# Patient Record
Sex: Male | Born: 1935
Health system: Southern US, Community
[De-identification: ages and names within clinical notes are randomized; demographics above are authoritative.]

## PROBLEM LIST (undated history)

## (undated) DIAGNOSIS — H9190 Unspecified hearing loss, unspecified ear: Secondary | ICD-10-CM

## (undated) DIAGNOSIS — N390 Urinary tract infection, site not specified: Secondary | ICD-10-CM

## (undated) DIAGNOSIS — G459 Transient cerebral ischemic attack, unspecified: Secondary | ICD-10-CM

## (undated) DIAGNOSIS — E7800 Pure hypercholesterolemia, unspecified: Secondary | ICD-10-CM

## (undated) DIAGNOSIS — I4891 Unspecified atrial fibrillation: Secondary | ICD-10-CM

## (undated) DIAGNOSIS — R42 Dizziness and giddiness: Secondary | ICD-10-CM

## (undated) DIAGNOSIS — M316 Other giant cell arteritis: Secondary | ICD-10-CM

## (undated) HISTORY — PX: OTHER SURGICAL HISTORY: SHX169

## (undated) HISTORY — DX: Pure hypercholesterolemia, unspecified: E78.00

## (undated) HISTORY — DX: Unspecified hearing loss, unspecified ear: H91.90

## (undated) HISTORY — DX: Dizziness and giddiness: R42

---

## 2011-11-02 DIAGNOSIS — H43399 Other vitreous opacities, unspecified eye: Secondary | ICD-10-CM | POA: Diagnosis not present

## 2011-11-02 DIAGNOSIS — H25019 Cortical age-related cataract, unspecified eye: Secondary | ICD-10-CM | POA: Diagnosis not present

## 2012-07-17 DIAGNOSIS — Z23 Encounter for immunization: Secondary | ICD-10-CM | POA: Diagnosis not present

## 2013-06-27 DIAGNOSIS — Z23 Encounter for immunization: Secondary | ICD-10-CM | POA: Diagnosis not present

## 2014-06-17 DIAGNOSIS — L259 Unspecified contact dermatitis, unspecified cause: Secondary | ICD-10-CM | POA: Diagnosis not present

## 2014-06-26 DIAGNOSIS — I10 Essential (primary) hypertension: Secondary | ICD-10-CM | POA: Diagnosis not present

## 2014-06-26 DIAGNOSIS — Z23 Encounter for immunization: Secondary | ICD-10-CM | POA: Diagnosis not present

## 2014-06-26 DIAGNOSIS — Z79899 Other long term (current) drug therapy: Secondary | ICD-10-CM | POA: Diagnosis not present

## 2014-06-27 DIAGNOSIS — I10 Essential (primary) hypertension: Secondary | ICD-10-CM | POA: Diagnosis not present

## 2014-06-27 DIAGNOSIS — Z125 Encounter for screening for malignant neoplasm of prostate: Secondary | ICD-10-CM | POA: Diagnosis not present

## 2014-06-27 DIAGNOSIS — Z79899 Other long term (current) drug therapy: Secondary | ICD-10-CM | POA: Diagnosis not present

## 2014-06-27 DIAGNOSIS — Z0000 Encounter for general adult medical examination without abnormal findings: Secondary | ICD-10-CM | POA: Diagnosis not present

## 2014-07-03 DIAGNOSIS — E789 Disorder of lipoprotein metabolism, unspecified: Secondary | ICD-10-CM | POA: Diagnosis not present

## 2014-07-10 DIAGNOSIS — Z23 Encounter for immunization: Secondary | ICD-10-CM | POA: Diagnosis not present

## 2014-10-02 DIAGNOSIS — E789 Disorder of lipoprotein metabolism, unspecified: Secondary | ICD-10-CM | POA: Diagnosis not present

## 2014-10-09 DIAGNOSIS — L249 Irritant contact dermatitis, unspecified cause: Secondary | ICD-10-CM | POA: Diagnosis not present

## 2014-10-09 DIAGNOSIS — E789 Disorder of lipoprotein metabolism, unspecified: Secondary | ICD-10-CM | POA: Diagnosis not present

## 2014-10-15 DIAGNOSIS — L309 Dermatitis, unspecified: Secondary | ICD-10-CM | POA: Diagnosis not present

## 2014-10-15 DIAGNOSIS — L738 Other specified follicular disorders: Secondary | ICD-10-CM | POA: Diagnosis not present

## 2014-10-27 DIAGNOSIS — L309 Dermatitis, unspecified: Secondary | ICD-10-CM | POA: Diagnosis not present

## 2014-11-12 DIAGNOSIS — L309 Dermatitis, unspecified: Secondary | ICD-10-CM | POA: Diagnosis not present

## 2014-12-01 DIAGNOSIS — M316 Other giant cell arteritis: Secondary | ICD-10-CM | POA: Diagnosis not present

## 2014-12-01 DIAGNOSIS — H9190 Unspecified hearing loss, unspecified ear: Secondary | ICD-10-CM | POA: Diagnosis not present

## 2014-12-01 DIAGNOSIS — R51 Headache: Secondary | ICD-10-CM | POA: Diagnosis not present

## 2014-12-02 DIAGNOSIS — L249 Irritant contact dermatitis, unspecified cause: Secondary | ICD-10-CM | POA: Diagnosis not present

## 2014-12-04 ENCOUNTER — Encounter: Payer: Self-pay | Admitting: *Deleted

## 2014-12-05 ENCOUNTER — Ambulatory Visit (INDEPENDENT_AMBULATORY_CARE_PROVIDER_SITE_OTHER): Payer: Medicare Other | Admitting: Neurology

## 2014-12-05 ENCOUNTER — Encounter: Payer: Self-pay | Admitting: Neurology

## 2014-12-05 VITALS — BP 158/80 | HR 72 | Ht 68.0 in | Wt 157.2 lb

## 2014-12-05 DIAGNOSIS — H9192 Unspecified hearing loss, left ear: Secondary | ICD-10-CM | POA: Diagnosis not present

## 2014-12-05 DIAGNOSIS — R209 Unspecified disturbances of skin sensation: Secondary | ICD-10-CM | POA: Diagnosis not present

## 2014-12-05 DIAGNOSIS — R202 Paresthesia of skin: Secondary | ICD-10-CM

## 2014-12-05 NOTE — Progress Notes (Signed)
[facility] Clinic Note - Initial Visit   Date: 12/05/2014   Casey Lee MRN: [id number] DOB: [birth year]   Dear Dr. Wilson Singer:  Thank you for your kind referral of Casey Lee for consultation of giant cell arteritis. Although his history is well known to you, please allow [place] to reiterate it for the purpose of our medical record. The patient was accompanied to the clinic by self.    History of Present Illness: Casey Lee is a 81 y.o. right-handed Caucasian male in remarkable good health who is only taking medication for hyperlipidemia and aspirin for cardioprotective benefits.  He was referred today for evaluation of left facial numbness, left hearing loss, and evaluation for giant cell arteritis.    He was in very good health and last sought medical care last in 1993, however in September 2015, he developed a rash involving his hands so went to see his PCP.  Rash was treated with antibiotics and prednisone taper in December which cleared up his rash.  Around the same time, he developed intermittent spells of numbness involving the left side of the face.  He thought it was due to statin medication, so stopped it but there was no change in his facial numbness.  The numbness occurs about 1-2 per week without any triggers. There is no tingling, pain, weakness, of drooping of the face.   About two weeks ago, he developed left hearing loss.  There is no ear pain, drainage, or recent illness.  No problems with balance, vision problems, headaches, or temperal artery tenderness.  Denies jaw claudication.  Last eye evaluation was two years ago.  He has not had audiogram.  He has been seeing his PCP for the above complaints and had ESR checked which returned normal x 3. Due to concern of giant cell arteritis, he was started on prednisone taper and has noticed hearing starting to improve.    Out-side paper records, electronic medical record, and images have been  reviewed where available and summarized as:  ESR 5 (10/09/2014), 7 (12/01/2014), 5 (12/02/2014) CRP 1.9 (12/02/2014)  Past Medical History  Diagnosis Date  . High cholesterol   . Hearing loss     Past Surgical History  Procedure Laterality Date  . None       Medications:  Aspirin 25m Pravastatin 875mPrednisone taper pack   Allergies: No Known Allergies  Family History: Family History  Problem Relation Age of Onset  . Rheum arthritis Father   . Rheum arthritis Sister   . Heart failure Father     Deceased, 726. Diabetes Mellitus II Sister   . Healthy Son     x2    Social History: History   Social History  . Marital Status: Married    Spouse Name: N/A  . Number of Children: N/A  . Years of Education: N/A   Occupational History  . Not on file.   Social History Main Topics  . Smoking status: Former Smoker    Types: Cigarettes    Quit date: 11/07/1952  . Smokeless tobacco: Never Used  . Alcohol Use: No  . Drug Use: No  . Sexual Activity: Not on file   Other Topics Concern  . Not on file   Social History Narrative   Lives with wife in split level home.  Has 2 sons.     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   EYES: No visual changes or eye pain ENT: +hearing changes.  No history of nose bleeds.   RESPIRATORY: No cough, wheezing and shortness of breath.   CARDIOVASCULAR: Negative for chest pain, and palpitations.   GI: Negative for abdominal discomfort, blood in stools or black stools.  No recent change in bowel habits.   GU:  No history of incontinence.   MUSCLOSKELETAL: No history of joint pain or swelling.  No myalgias.   SKIN: Negative for lesions, rash, and itching.   HEMATOLOGY/ONCOLOGY: Negative for prolonged bleeding, bruising easily, and swollen nodes.  No history of cancer.   ENDOCRINE: Negative for cold or heat  intolerance, polydipsia or goiter.   PSYCH:  No depression or anxiety symptoms.   NEURO: As Above.   Vital Signs:  BP 158/80 mmHg  Pulse 72  Ht 5' 8"  (1.727 m)  Wt 157 lb 3 oz (71.3 kg)  BMI 23.91 kg/m2  SpO2 97%   General Medical Exam:   General:  Well appearing, comfortable.   Eyes/ENT: see cranial nerve examination.   Neck: No masses appreciated.  Full range of motion without tenderness.  No carotid, opthalmic, or temporal artery bruits bilaterally.  Bilateral thready temporal arteries, without tenderness. Respiratory:  Clear to auscultation, good air entry bilaterally.   Cardiac:  Regular rate and rhythm, no murmur.   Extremities:  No deformities, edema, or skin discoloration.  Skin:  No rashes or lesions.  Neurological Exam: MENTAL STATUS including orientation to time, place, person, recent and remote memory, attention span and concentration, language, and fund of knowledge is normal.  Speech is not dysarthric.  CRANIAL NERVES: II:  No visual field defects.  Unremarkable fundi.   III-IV-VI: Pupils equal round and reactive to light. Mild esotropia on the left, no correction with cover test. Normal conjugate, extra-ocular eye movements in all directions of gaze.  No nystagmus.  No ptosis.   V:  Normal facial sensation.  Jaw jerk is absent.   VII:  Normal facial symmetry and movements.  No pathologic facial reflexes.  VIII:  Normal hearing and vestibular function.  Rinne and Weber test is normal bilaterally. IX-X:  Normal palatal movement.   XI:  Normal shoulder shrug and head rotation.   XII:  Normal tongue strength and range of motion, no deviation or fasciculation.  MOTOR:  No atrophy, fasciculations or abnormal movements.  No pronator drift.  Tone is normal.    Right Upper Extremity:    Left Upper Extremity:    Deltoid  5/5   Deltoid  5/5   Biceps  5/5   Biceps  5/5   Triceps  5/5   Triceps  5/5   Wrist extensors  5/5   Wrist extensors  5/5   Wrist flexors  5/5   Wrist  flexors  5/5   Finger extensors  5/5   Finger extensors  5/5   Finger flexors  5/5   Finger flexors  5/5   Dorsal interossei  5/5   Dorsal interossei  5/5   Abductor pollicis  5/5   Abductor pollicis  5/5   Tone (Ashworth scale)  0  Tone (Ashworth scale)  0   Right Lower Extremity:    Left Lower Extremity:    Hip flexors  5/5   Hip flexors  5/5   Hip extensors  5/5   Hip extensors  5/5   Knee flexors  5/5   Knee flexors  5/5  Knee extensors  5/5   Knee extensors  5/5   Dorsiflexors  5/5   Dorsiflexors  5/5   Plantarflexors  5/5   Plantarflexors  5/5   Toe extensors  5/5   Toe extensors  5/5   Toe flexors  5/5   Toe flexors  5/5   Tone (Ashworth scale)  0  Tone (Ashworth scale)  0   MSRs:  Right                                                                 Left brachioradialis 2+  brachioradialis 2+  biceps 2+  biceps 2+  triceps 2+  triceps 2+  patellar 2+  patellar 2+  ankle jerk 1+  ankle jerk 1+  Hoffman no  Hoffman no  plantar response down  plantar response down   SENSORY:  Normal and symmetric perception of light touch, pinprick, vibration, and proprioception.  Romberg's sign absent.   COORDINATION/GAIT: Normal finger-to- nose-finger and heel-to-shin.  Intact rapid alternating movements bilaterally.  Able to rise from a chair without using arms.  Gait narrow based and stable. Tandem and stressed gait intact.    IMPRESSION/PLAN: Mr. Epps is a delightful 82 year-old gentleman in remarkable good health presenting for evaluation of possible giant cell arteritis in the setting of left hearing changes and facial paresthesias.  His examination today is essentially non-focal. His temporal arteries are symmetrically thready, but without tenderness.  He lacks the typical findings for GCA including headache, vision changes, elevated sedimentation rate, jaw claudication, or mylagias.  Hearing loss is very atypical presenting for GCA and in the absence of other features to support  vasculitis, the pre-test probability of temporal artery biopsy is low.  I did offer send him for temporal artery biopsy, but he would like to see how he does after completing his course of steroids.   Additionally, with his left facial paresthesias and hearing changes, I did recommend getting MRI brain given the lateralizing symptoms.  On exam, sensation of the face is intact and hearing based on gross testing is symmetrically intact.  He chooses to be conservative and would like to hold on doing additional testing at this time.  I stressed the importance of informing me, if his symptoms worsen.     The duration of this appointment visit was 50 minutes of face-to-face time with the patient.  Greater than 50% of this time was spent in counseling, explanation of diagnosis, planning of further management, and coordination of care.   Thank you for allowing me to participate in patient's care.  If I can answer any additional questions, I would be pleased to do so.    Sincerely,    Donika K. Posey Pronto, DO

## 2014-12-05 NOTE — Patient Instructions (Signed)
1.  If there is any worsening of your facial discomfort or hearing loss, please call my office so we can schedule MRI brain 2.  If there is worsening of symptoms after completing steroids, you will need to be scheduled for temporal artery biopsy, but my overall suspicion for this is low. 3.  Please have your eyes checked 4.  Return to clinic as needed

## 2014-12-08 NOTE — Progress Notes (Signed)
Note faxed.

## 2015-02-04 DIAGNOSIS — L738 Other specified follicular disorders: Secondary | ICD-10-CM | POA: Diagnosis not present

## 2015-02-04 DIAGNOSIS — T814XXA Infection following a procedure, initial encounter: Secondary | ICD-10-CM | POA: Diagnosis not present

## 2015-02-04 DIAGNOSIS — M7021 Olecranon bursitis, right elbow: Secondary | ICD-10-CM | POA: Diagnosis not present

## 2015-02-04 DIAGNOSIS — D1801 Hemangioma of skin and subcutaneous tissue: Secondary | ICD-10-CM | POA: Diagnosis not present

## 2015-02-04 DIAGNOSIS — L821 Other seborrheic keratosis: Secondary | ICD-10-CM | POA: Diagnosis not present

## 2015-02-26 DIAGNOSIS — L218 Other seborrheic dermatitis: Secondary | ICD-10-CM | POA: Diagnosis not present

## 2015-02-26 DIAGNOSIS — L309 Dermatitis, unspecified: Secondary | ICD-10-CM | POA: Diagnosis not present

## 2015-07-02 DIAGNOSIS — Z23 Encounter for immunization: Secondary | ICD-10-CM | POA: Diagnosis not present

## 2015-11-02 DIAGNOSIS — N390 Urinary tract infection, site not specified: Secondary | ICD-10-CM | POA: Diagnosis not present

## 2015-11-02 DIAGNOSIS — N400 Enlarged prostate without lower urinary tract symptoms: Secondary | ICD-10-CM | POA: Diagnosis not present

## 2015-11-02 DIAGNOSIS — R32 Unspecified urinary incontinence: Secondary | ICD-10-CM | POA: Diagnosis not present

## 2015-11-03 DIAGNOSIS — N138 Other obstructive and reflux uropathy: Secondary | ICD-10-CM | POA: Diagnosis not present

## 2015-11-03 DIAGNOSIS — N401 Enlarged prostate with lower urinary tract symptoms: Secondary | ICD-10-CM | POA: Diagnosis not present

## 2015-11-03 DIAGNOSIS — Z0000 Encounter for general adult medical examination without abnormal findings: Secondary | ICD-10-CM | POA: Diagnosis not present

## 2015-11-11 ENCOUNTER — Encounter (HOSPITAL_COMMUNITY): Payer: Self-pay | Admitting: Emergency Medicine

## 2015-11-11 ENCOUNTER — Emergency Department (HOSPITAL_COMMUNITY)
Admission: EM | Admit: 2015-11-11 | Discharge: 2015-11-11 | Disposition: A | Discharge status: Home / Self Care | Payer: Medicare Other | Attending: Emergency Medicine | Admitting: Emergency Medicine

## 2015-11-11 DIAGNOSIS — Z7952 Long term (current) use of systemic steroids: Secondary | ICD-10-CM | POA: Insufficient documentation

## 2015-11-11 DIAGNOSIS — E7800 Pure hypercholesterolemia, unspecified: Secondary | ICD-10-CM | POA: Diagnosis not present

## 2015-11-11 DIAGNOSIS — Z87440 Personal history of urinary (tract) infections: Secondary | ICD-10-CM | POA: Insufficient documentation

## 2015-11-11 DIAGNOSIS — Z87891 Personal history of nicotine dependence: Secondary | ICD-10-CM | POA: Insufficient documentation

## 2015-11-11 DIAGNOSIS — H9190 Unspecified hearing loss, unspecified ear: Secondary | ICD-10-CM | POA: Insufficient documentation

## 2015-11-11 DIAGNOSIS — R531 Weakness: Secondary | ICD-10-CM | POA: Diagnosis present

## 2015-11-11 DIAGNOSIS — N400 Enlarged prostate without lower urinary tract symptoms: Secondary | ICD-10-CM | POA: Diagnosis not present

## 2015-11-11 DIAGNOSIS — Z79899 Other long term (current) drug therapy: Secondary | ICD-10-CM | POA: Insufficient documentation

## 2015-11-11 DIAGNOSIS — E860 Dehydration: Secondary | ICD-10-CM | POA: Diagnosis not present

## 2015-11-11 DIAGNOSIS — R110 Nausea: Secondary | ICD-10-CM | POA: Insufficient documentation

## 2015-11-11 DIAGNOSIS — Z7982 Long term (current) use of aspirin: Secondary | ICD-10-CM | POA: Insufficient documentation

## 2015-11-11 DIAGNOSIS — T887XXA Unspecified adverse effect of drug or medicament, initial encounter: Secondary | ICD-10-CM | POA: Diagnosis not present

## 2015-11-11 DIAGNOSIS — R42 Dizziness and giddiness: Secondary | ICD-10-CM | POA: Diagnosis not present

## 2015-11-11 HISTORY — DX: Urinary tract infection, site not specified: N39.0

## 2015-11-11 LAB — CBC WITH DIFFERENTIAL/PLATELET
Basophils Absolute: 0 K/uL (ref 0.0–0.1)
Basophils Relative: 0 %
Eosinophils Absolute: 0.2 K/uL (ref 0.0–0.7)
Eosinophils Relative: 2 %
HCT: 41.1 % (ref 39.0–52.0)
Hemoglobin: 13.9 g/dL (ref 13.0–17.0)
Lymphocytes Relative: 16 %
Lymphs Abs: 1.8 K/uL (ref 0.7–4.0)
MCH: 30.5 pg (ref 26.0–34.0)
MCHC: 33.8 g/dL (ref 30.0–36.0)
MCV: 90.1 fL (ref 78.0–100.0)
Monocytes Absolute: 1.1 K/uL — ABNORMAL HIGH (ref 0.1–1.0)
Monocytes Relative: 9 %
Neutro Abs: 8.4 K/uL — ABNORMAL HIGH (ref 1.7–7.7)
Neutrophils Relative %: 73 %
Platelets: 225 K/uL (ref 150–400)
RBC: 4.56 MIL/uL (ref 4.22–5.81)
RDW: 13.1 % (ref 11.5–15.5)
WBC: 11.6 K/uL — ABNORMAL HIGH (ref 4.0–10.5)

## 2015-11-11 LAB — COMPREHENSIVE METABOLIC PANEL
ALK PHOS: 95 U/L (ref 38–126)
ALT: 24 U/L (ref 17–63)
ANION GAP: 13 (ref 5–15)
AST: 26 U/L (ref 15–41)
Albumin: 3.8 g/dL (ref 3.5–5.0)
BUN: 27 mg/dL — ABNORMAL HIGH (ref 6–20)
CALCIUM: 9.6 mg/dL (ref 8.9–10.3)
CO2: 22 mmol/L (ref 22–32)
Chloride: 98 mmol/L — ABNORMAL LOW (ref 101–111)
Creatinine, Ser: 1.98 mg/dL — ABNORMAL HIGH (ref 0.61–1.24)
GFR calc Af Amer: 34 mL/min — ABNORMAL LOW (ref >60)
GFR calc non Af Amer: 29 mL/min — ABNORMAL LOW (ref >60)
Glucose, Bld: 138 mg/dL — ABNORMAL HIGH (ref 65–99)
Potassium: 4.7 mmol/L (ref 3.5–5.1)
SODIUM: 133 mmol/L — AB (ref 135–145)
Total Bilirubin: 0.8 mg/dL (ref 0.3–1.2)
Total Protein: 7.8 g/dL (ref 6.5–8.1)

## 2015-11-11 LAB — URINALYSIS, ROUTINE W REFLEX MICROSCOPIC
Bilirubin Urine: NEGATIVE
Glucose, UA: NEGATIVE mg/dL
Hgb urine dipstick: NEGATIVE
Ketones, ur: 15 mg/dL — AB
Leukocytes, UA: NEGATIVE
Nitrite: NEGATIVE
Protein, ur: NEGATIVE mg/dL
Specific Gravity, Urine: 1.024 (ref 1.005–1.030)
pH: 5 (ref 5.0–8.0)

## 2015-11-11 LAB — I-STAT TROPONIN, ED: Troponin i, poc: 0.02 ng/mL (ref 0.00–0.08)

## 2015-11-11 MED ORDER — SODIUM CHLORIDE 0.9 % IV BOLUS (SEPSIS)
500.0000 mL | Freq: Once | INTRAVENOUS | Status: AC
  Administered 2015-11-11: 500 mL via INTRAVENOUS

## 2015-11-11 NOTE — Discharge Instructions (Signed)

## 2015-11-11 NOTE — ED Notes (Signed)
Pt left with all belongings and ambulated out of the treatment area.  

## 2015-11-11 NOTE — ED Notes (Signed)
Pt. reports generalized weakness and  fatigue onset this week , mild nausea , denies emesis or diarrhea . No fever or chills.

## 2015-11-11 NOTE — ED Provider Notes (Signed)
CSN: [id number]     Arrival date & time 11/11/15  1913 History   First MD Initiated Contact with Patient 11/11/15 2057     Chief Complaint  Patient presents with  . Weakness  . Fatigue      HPI Pt. reports generalized weakness and fatigue onset this week , mild nausea , denies emesis or diarrhea . No fever or chills. Recently treated for UTI and BPH.  Urine flow is much improved.  No fever. Past Medical History  Diagnosis Date  . High cholesterol   . Hearing loss   . UTI (lower urinary tract infection)    Past Surgical History  Procedure Laterality Date  . None     Family History  Problem Relation Age of Onset  . Rheum arthritis Father   . Rheum arthritis Sister   . Heart failure Father     Deceased, 85  . Diabetes Mellitus II Sister   . Healthy Son     x2   Social History  Substance Use Topics  . Smoking status: Former Smoker    Types: Cigarettes    Quit date: 11/07/1952  . Smokeless tobacco: Never Used  . Alcohol Use: No    Review of Systems  Cardiovascular: Negative for chest pain.  All other systems reviewed and are negative.     Allergies  Review of patient's allergies indicates no known allergies.  Home Medications   Prior to Admission medications   Medication Sig Start Date End Date Taking? Authorizing Provider  aspirin 81 MG tablet Take 81 mg by mouth daily.    Historical Provider, MD  pravastatin (PRAVACHOL) 80 MG tablet Take 80 mg by mouth daily.    Historical Provider, MD  predniSONE (STERAPRED UNI-PAK) 10 MG tablet Take by mouth daily.    Historical Provider, MD   BP 130/60 mmHg  Pulse 80  Temp(Src) 98 F (36.7 C) (Oral)  Resp 19  SpO2 99% Physical Exam  Constitutional: He is oriented to person, place, and time. He appears well-developed and well-nourished. No distress.  HENT:  Head: Normocephalic and atraumatic.  Eyes: Pupils are equal, round, and reactive to light.  Neck: Normal range of motion.  Cardiovascular: Normal rate and  intact distal pulses.   Pulmonary/Chest: No respiratory distress.  Abdominal: Normal appearance. He exhibits no distension. There is no tenderness. There is no rebound and no guarding.  Musculoskeletal: Normal range of motion.  Neurological: He is alert and oriented to person, place, and time. No cranial nerve deficit.  Skin: Skin is warm and dry. No rash noted.  Psychiatric: He has a normal mood and affect. His behavior is normal.  Nursing note and vitals reviewed.   ED Course  Procedures (including critical care time) Medications  sodium chloride 0.9 % bolus 500 mL (500 mLs Intravenous New Bag/Given 11/11/15 2134)    Labs Review Labs Reviewed  CBC WITH DIFFERENTIAL/PLATELET - Abnormal; Notable for the following:    WBC 11.6 (*)    Neutro Abs 8.4 (*)    Monocytes Absolute 1.1 (*)    All other components within normal limits  COMPREHENSIVE METABOLIC PANEL - Abnormal; Notable for the following:    Sodium 133 (*)    Chloride 98 (*)    Glucose, Bld 138 (*)    BUN 27 (*)    Creatinine, Ser 1.98 (*)    GFR calc non Af Amer 29 (*)    GFR calc Af Amer 34 (*)    All other components within  normal limits  URINALYSIS, ROUTINE W REFLEX MICROSCOPIC (NOT AT [facility]) - Abnormal; Notable for the following:    Ketones, ur 15 (*)    All other components within normal limits  I-STAT TROPOININ, ED    Imaging Review No results found. I have personally reviewed and evaluated these images and lab results as part of my medical decision-making.   EKG Interpretation   Date/Time:  Wednesday November 11 2015 21:09:28 EST Ventricular Rate:  75 PR Interval:  181 QRS Duration: 88 QT Interval:  379 QTC Calculation: 423 R Axis:   -51 Text Interpretation:  Sinus rhythm LAD, consider left anterior fascicular  block Abnormal R-wave progression, early transition Abnormal ekg No  previous tracing Confirmed by Anokhi Shannon  MD, Meshach Perry ([id number]) on 11/11/2015  9:22:24 PM     After treatment in the ED the patient  feels back to baseline and wants to go home. MDM   Final diagnoses:  Dehydration        Leonard Schwartz, MD 11/11/15 2225

## 2015-11-16 DIAGNOSIS — R42 Dizziness and giddiness: Secondary | ICD-10-CM | POA: Diagnosis not present

## 2015-11-16 DIAGNOSIS — N400 Enlarged prostate without lower urinary tract symptoms: Secondary | ICD-10-CM | POA: Diagnosis not present

## 2015-12-15 DIAGNOSIS — N401 Enlarged prostate with lower urinary tract symptoms: Secondary | ICD-10-CM | POA: Diagnosis not present

## 2015-12-15 DIAGNOSIS — Z0000 Encounter for general adult medical examination without abnormal findings: Secondary | ICD-10-CM | POA: Diagnosis not present

## 2015-12-15 DIAGNOSIS — N138 Other obstructive and reflux uropathy: Secondary | ICD-10-CM | POA: Diagnosis not present

## 2016-01-24 ENCOUNTER — Inpatient Hospital Stay (HOSPITAL_COMMUNITY)
Admission: EM | Admit: 2016-01-24 | Discharge: 2016-01-28 | DRG: 082 | Disposition: A | Discharge status: Home / Self Care | Payer: Medicare Other | Attending: Neurology | Admitting: Neurology

## 2016-01-24 ENCOUNTER — Encounter (HOSPITAL_COMMUNITY): Payer: Self-pay | Admitting: Emergency Medicine

## 2016-01-24 ENCOUNTER — Emergency Department (HOSPITAL_COMMUNITY): Payer: Medicare Other

## 2016-01-24 ENCOUNTER — Inpatient Hospital Stay (HOSPITAL_COMMUNITY): Payer: Medicare Other

## 2016-01-24 DIAGNOSIS — S06359A Traumatic hemorrhage of left cerebrum with loss of consciousness of unspecified duration, initial encounter: Secondary | ICD-10-CM | POA: Diagnosis present

## 2016-01-24 DIAGNOSIS — M25512 Pain in left shoulder: Secondary | ICD-10-CM | POA: Diagnosis present

## 2016-01-24 DIAGNOSIS — S06350A Traumatic hemorrhage of left cerebrum without loss of consciousness, initial encounter: Secondary | ICD-10-CM | POA: Diagnosis not present

## 2016-01-24 DIAGNOSIS — R4182 Altered mental status, unspecified: Secondary | ICD-10-CM | POA: Diagnosis not present

## 2016-01-24 DIAGNOSIS — Z23 Encounter for immunization: Secondary | ICD-10-CM | POA: Diagnosis not present

## 2016-01-24 DIAGNOSIS — S06369A Traumatic hemorrhage of cerebrum, unspecified, with loss of consciousness of unspecified duration, initial encounter: Secondary | ICD-10-CM | POA: Diagnosis present

## 2016-01-24 DIAGNOSIS — I63311 Cerebral infarction due to thrombosis of right middle cerebral artery: Secondary | ICD-10-CM | POA: Diagnosis not present

## 2016-01-24 DIAGNOSIS — N179 Acute kidney failure, unspecified: Secondary | ICD-10-CM | POA: Diagnosis present

## 2016-01-24 DIAGNOSIS — I6200 Nontraumatic subdural hemorrhage, unspecified: Secondary | ICD-10-CM | POA: Diagnosis not present

## 2016-01-24 DIAGNOSIS — W19XXXA Unspecified fall, initial encounter: Secondary | ICD-10-CM | POA: Diagnosis present

## 2016-01-24 DIAGNOSIS — S0001XA Abrasion of scalp, initial encounter: Secondary | ICD-10-CM | POA: Diagnosis present

## 2016-01-24 DIAGNOSIS — E785 Hyperlipidemia, unspecified: Secondary | ICD-10-CM | POA: Diagnosis present

## 2016-01-24 DIAGNOSIS — R412 Retrograde amnesia: Secondary | ICD-10-CM | POA: Diagnosis present

## 2016-01-24 DIAGNOSIS — S065X9A Traumatic subdural hemorrhage with loss of consciousness of unspecified duration, initial encounter: Secondary | ICD-10-CM | POA: Diagnosis not present

## 2016-01-24 DIAGNOSIS — S0181XA Laceration without foreign body of other part of head, initial encounter: Secondary | ICD-10-CM | POA: Diagnosis not present

## 2016-01-24 DIAGNOSIS — Y92019 Unspecified place in single-family (private) house as the place of occurrence of the external cause: Secondary | ICD-10-CM

## 2016-01-24 DIAGNOSIS — F05 Delirium due to known physiological condition: Secondary | ICD-10-CM

## 2016-01-24 DIAGNOSIS — Z87891 Personal history of nicotine dependence: Secondary | ICD-10-CM | POA: Diagnosis not present

## 2016-01-24 DIAGNOSIS — R410 Disorientation, unspecified: Secondary | ICD-10-CM

## 2016-01-24 DIAGNOSIS — S0121XA Laceration without foreign body of nose, initial encounter: Secondary | ICD-10-CM | POA: Diagnosis present

## 2016-01-24 DIAGNOSIS — R411 Anterograde amnesia: Secondary | ICD-10-CM | POA: Diagnosis present

## 2016-01-24 DIAGNOSIS — I639 Cerebral infarction, unspecified: Secondary | ICD-10-CM | POA: Diagnosis not present

## 2016-01-24 DIAGNOSIS — S0990XA Unspecified injury of head, initial encounter: Secondary | ICD-10-CM | POA: Diagnosis not present

## 2016-01-24 DIAGNOSIS — E7800 Pure hypercholesterolemia, unspecified: Secondary | ICD-10-CM | POA: Diagnosis present

## 2016-01-24 DIAGNOSIS — H9190 Unspecified hearing loss, unspecified ear: Secondary | ICD-10-CM | POA: Diagnosis present

## 2016-01-24 DIAGNOSIS — I619 Nontraumatic intracerebral hemorrhage, unspecified: Secondary | ICD-10-CM

## 2016-01-24 DIAGNOSIS — S199XXA Unspecified injury of neck, initial encounter: Secondary | ICD-10-CM | POA: Diagnosis not present

## 2016-01-24 DIAGNOSIS — H8110 Benign paroxysmal vertigo, unspecified ear: Secondary | ICD-10-CM | POA: Diagnosis present

## 2016-01-24 DIAGNOSIS — S065XAA Traumatic subdural hemorrhage with loss of consciousness status unknown, initial encounter: Secondary | ICD-10-CM | POA: Diagnosis present

## 2016-01-24 DIAGNOSIS — S065X0A Traumatic subdural hemorrhage without loss of consciousness, initial encounter: Secondary | ICD-10-CM | POA: Diagnosis not present

## 2016-01-24 DIAGNOSIS — I638 Other cerebral infarction: Secondary | ICD-10-CM | POA: Diagnosis present

## 2016-01-24 DIAGNOSIS — I10 Essential (primary) hypertension: Secondary | ICD-10-CM | POA: Diagnosis not present

## 2016-01-24 DIAGNOSIS — I6789 Other cerebrovascular disease: Secondary | ICD-10-CM | POA: Diagnosis present

## 2016-01-24 DIAGNOSIS — S0003XA Contusion of scalp, initial encounter: Secondary | ICD-10-CM | POA: Diagnosis not present

## 2016-01-24 DIAGNOSIS — R079 Chest pain, unspecified: Secondary | ICD-10-CM | POA: Diagnosis present

## 2016-01-24 DIAGNOSIS — Z8249 Family history of ischemic heart disease and other diseases of the circulatory system: Secondary | ICD-10-CM | POA: Diagnosis not present

## 2016-01-24 DIAGNOSIS — G9340 Encephalopathy, unspecified: Secondary | ICD-10-CM | POA: Diagnosis not present

## 2016-01-24 DIAGNOSIS — S06360A Traumatic hemorrhage of cerebrum, unspecified, without loss of consciousness, initial encounter: Secondary | ICD-10-CM | POA: Diagnosis not present

## 2016-01-24 DIAGNOSIS — S299XXA Unspecified injury of thorax, initial encounter: Secondary | ICD-10-CM | POA: Diagnosis not present

## 2016-01-24 LAB — I-STAT CHEM 8, ED
BUN: 34 mg/dL — AB (ref 6–20)
CHLORIDE: 104 mmol/L (ref 101–111)
CREATININE: 1.5 mg/dL — AB (ref 0.61–1.24)
Calcium, Ion: 1.11 mmol/L — ABNORMAL LOW (ref 1.13–1.30)
GLUCOSE: 111 mg/dL — AB (ref 65–99)
HEMATOCRIT: 44 % (ref 39.0–52.0)
HEMOGLOBIN: 15 g/dL (ref 13.0–17.0)
POTASSIUM: 3.9 mmol/L (ref 3.5–5.1)
Sodium: 138 mmol/L (ref 135–145)
TCO2: 23 mmol/L (ref 0–100)

## 2016-01-24 LAB — URINALYSIS, ROUTINE W REFLEX MICROSCOPIC
BILIRUBIN URINE: NEGATIVE
Glucose, UA: NEGATIVE mg/dL
HGB URINE DIPSTICK: NEGATIVE
KETONES UR: NEGATIVE mg/dL
Leukocytes, UA: NEGATIVE
Nitrite: NEGATIVE
PROTEIN: NEGATIVE mg/dL
Specific Gravity, Urine: 1.01 (ref 1.005–1.030)
pH: 7.5 (ref 5.0–8.0)

## 2016-01-24 LAB — COMPREHENSIVE METABOLIC PANEL
ALBUMIN: 3.9 g/dL (ref 3.5–5.0)
ALT: 16 U/L — AB (ref 17–63)
AST: 22 U/L (ref 15–41)
Alkaline Phosphatase: 60 U/L (ref 38–126)
Anion gap: 14 (ref 5–15)
BUN: 28 mg/dL — AB (ref 6–20)
CHLORIDE: 102 mmol/L (ref 101–111)
CO2: 20 mmol/L — ABNORMAL LOW (ref 22–32)
CREATININE: 1.5 mg/dL — AB (ref 0.61–1.24)
Calcium: 9.4 mg/dL (ref 8.9–10.3)
GFR calc Af Amer: 47 mL/min — ABNORMAL LOW (ref >60)
GFR, EST NON AFRICAN AMERICAN: 41 mL/min — AB (ref >60)
GLUCOSE: 117 mg/dL — AB (ref 65–99)
POTASSIUM: 3.7 mmol/L (ref 3.5–5.1)
Sodium: 136 mmol/L (ref 135–145)
Total Bilirubin: 0.6 mg/dL (ref 0.3–1.2)
Total Protein: 7.1 g/dL (ref 6.5–8.1)

## 2016-01-24 LAB — DIFFERENTIAL
BASOS ABS: 0 10*3/uL (ref 0.0–0.1)
BASOS PCT: 0 %
EOS ABS: 0.3 10*3/uL (ref 0.0–0.7)
Eosinophils Relative: 3 %
LYMPHS ABS: 1.7 10*3/uL (ref 0.7–4.0)
Lymphocytes Relative: 19 %
MONOS PCT: 7 %
Monocytes Absolute: 0.7 10*3/uL (ref 0.1–1.0)
NEUTROS ABS: 6.5 10*3/uL (ref 1.7–7.7)
NEUTROS PCT: 71 %

## 2016-01-24 LAB — CBC
HEMATOCRIT: 41.5 % (ref 39.0–52.0)
HEMOGLOBIN: 13.9 g/dL (ref 13.0–17.0)
MCH: 30.1 pg (ref 26.0–34.0)
MCHC: 33.5 g/dL (ref 30.0–36.0)
MCV: 89.8 fL (ref 78.0–100.0)
Platelets: 159 10*3/uL (ref 150–400)
RBC: 4.62 MIL/uL (ref 4.22–5.81)
RDW: 13.2 % (ref 11.5–15.5)
WBC: 9.2 10*3/uL (ref 4.0–10.5)

## 2016-01-24 LAB — I-STAT TROPONIN, ED: Troponin i, poc: 0 ng/mL (ref 0.00–0.08)

## 2016-01-24 LAB — PROTIME-INR
INR: 1.06 (ref 0.00–1.49)
Prothrombin Time: 14 seconds (ref 11.6–15.2)

## 2016-01-24 LAB — APTT: APTT: 29 s (ref 24–37)

## 2016-01-24 MED ORDER — ONDANSETRON HCL 4 MG/2ML IJ SOLN
4.0000 mg | Freq: Once | INTRAMUSCULAR | Status: AC
  Administered 2016-01-24: 4 mg via INTRAVENOUS
  Filled 2016-01-24: qty 2

## 2016-01-24 MED ORDER — TETANUS-DIPHTH-ACELL PERTUSSIS 5-2.5-18.5 LF-MCG/0.5 IM SUSP
0.5000 mL | Freq: Once | INTRAMUSCULAR | Status: AC
  Administered 2016-01-25: 0.5 mL via INTRAMUSCULAR
  Filled 2016-01-24: qty 0.5

## 2016-01-24 MED ORDER — BACITRACIN ZINC 500 UNIT/GM EX OINT
1.0000 "application " | TOPICAL_OINTMENT | Freq: Two times a day (BID) | CUTANEOUS | Status: DC
  Administered 2016-01-24 – 2016-01-27 (×6): 1 via TOPICAL
  Filled 2016-01-24 (×13): qty 28.35

## 2016-01-24 NOTE — H&P (Addendum)
H&P Reason for Consult: confusion - code stroke Referring Physician: EMS - Code stroke  CC: fall  History is obtained from pateint -   HPI: Casey Lee is a 82 y.o. male with hx of HTN who was at home in his [facility] when he got from bed and felt lightheaded and apparently felt lightheaded on the way to the bathroom.  He denies LOC but he does not remember falling or even waking up on the floor - essentially denies LOC but at this time has definite anterograde and retrograde amnisia around the time of the fall.  Denies dyaphoresis, blurred vision before the fall.  He was brought in as a code stroke because of confusion.  In teh CT scanner it became obvious that the patient had no neurological symptoms and thus I cancelled the code stroke.  I am admitting the pt instead as the CT showed two small hemorrhagic spots, one ICH and one SDH.  Denied any reason for being dehydrated. Denied ETOH.  Denied hx of sz.  ICH Score: 1  ROS: A 14 point ROS was performed and is negative except as noted in the HPI.   Past Medical History  Diagnosis Date  . High cholesterol   . Hearing loss   . UTI (lower urinary tract infection)     Family History  Problem Relation Age of Onset  . Rheum arthritis Father   . Rheum arthritis Sister   . Heart failure Father     Deceased, 71  . Diabetes Mellitus II Sister   . Healthy Son     x2    Social History:  reports that he quit smoking about 63 years ago. His smoking use included Cigarettes. He has never used smokeless tobacco. He reports that he does not drink alcohol or use illicit drugs.   Exam: Current vital signs: BP 179/89 mmHg  Pulse 77  Temp(Src) 97.9 F (36.6 C) (Oral)  Resp 18  Ht 5\' 9"  (1.753 m)  Wt 68.04 kg (150 lb)  BMI 22.14 kg/m2  SpO2 100% Vital signs in last 24 hours: Temp:  [97.9 F (36.6 C)] 97.9 F (36.6 C) (04/23 2250) Pulse Rate:  [77] 77 (04/23 2250) Resp:  [18] 18 (04/23 2250) BP: (179)/(89) 179/89 mmHg (04/23 2250) SpO2:   [100 %] 100 % (04/23 2250) Weight:  [68.04 kg (150 lb)] 68.04 kg (150 lb) (04/23 2250)   Physical Exam  Constitutional: Appears well-developed and well-nourished.  Psych: Affect appropriate to situation Eyes: No scleral injection HENT: No OP obstrucion Head: Normocephalic.  Cardiovascular: Normal rate and regular rhythm.  Respiratory: Effort normal and breath sounds normal to anterior ascultation GI: Soft.  No distension. There is no tenderness.  Skin: WDI  Neuro: Mental Status: slight difficulty hearing, baseline Patient is awake, alert, oriented to person, place, month, year, and situation Patient is able to give a clear and coherent history. No signs of aphasia or neglect Cranial Nerves: II: Visual Fields are full. Pupils are equal, round, and reactive to light.  III,IV, VI: EOMI without ptosis or diploplia.  V: Facial sensation is symmetric to temperature VII: Facial movement is symmetric.  VIII: hearing is intact to voice X: Uvula elevates symmetrically XI: Shoulder shrug is symmetric. XII: tongue is midline without atrophy or fasciculations.  Motor: Tone is normal. Bulk is normal. 5/5 strength was present in all four extremities. Sensory: Sensation is symmetric to light touch and temperature in the arms and legs. Deep Tendon Reflexes: 2+ and symmetric in  the biceps and patellae.  Plantars: Toes are downgoing bilaterally.  Cerebellar: FNF and HKS are intact bilaterally    I have reviewed labs in epic and the results pertinent to this consultation are: acute renal failure  I have reviewed the images obtained: CT showing small ICH and SDH - i reviewed these personally  Impression: traumatic ICH and SDH.  Admit to stepdown. MRI brain.  Blood pressure goal 120-140.  No antithrombotics or lovenox for now.  Restart home antihypertensive immediately.  Gentle fluids.  Stroke attending will assume care after 7am  Recommendations: 1) as above   Addendum: has acute  decompensation in the MRI.  Exam mostly significant for non-sensical speech in the setting of severe agitation and acute confusion, fear.  Given haldol and improved significantly. CT head repeated and unchanged to my review. Neuro exam significant for somnolence and encephalopathy, drowsy but moves X 4 with nl strength, no neglect, BTTx 2. Not attentive enough to follow commands.

## 2016-01-24 NOTE — ED Provider Notes (Signed)
CSN: [id number]     Arrival date & time 01/24/16  2229 History  By signing my name below, I, Irene Pap, attest that this documentation has been prepared under the direction and in the presence of Sharlett Iles, MD. Electronically Signed: Irene Pap, ED Scribe. 01/24/2016. 11:17 PM.   Chief Complaint  Patient presents with  . Head Injury   The history is provided by the patient. No language interpreter was used.  HPI Comments: Casey Lee is a 82 y.o. male with a hx of high cholesterol brought in by EMS who presents to the Emergency Department complaining of a possible fall and head injury onset PTA. Wife states that she was upstairs watching TV when she heard him call for her. She did not witness a fall. Pt does not remember falling or the events preceding the fall except for him watching TV. She found him holding the base of his neck complaining of pain which is why she called EMS. EMS reported that he was confused/altered on exam. Pt reports left clavicular pain and wife states that pt was clutching his left chest earlier. He was unaware about the wound on the left posterior head until EMS arrived. Pt is on ASA 81 mg and took 3 prior to EMS arrival. A code stroke was initially called for altered state, but upon reevaluation, code stroke was cancelled. Pt is complaining of dizziness with sitting up during exam. Pt denies upper back pain, blurred vision, or posterior neck pain. No extremity pain.  Past Medical History  Diagnosis Date  . High cholesterol   . Hearing loss   . UTI (lower urinary tract infection)    Past Surgical History  Procedure Laterality Date  . None     Family History  Problem Relation Age of Onset  . Rheum arthritis Father   . Rheum arthritis Sister   . Heart failure Father     Deceased, 81  . Diabetes Mellitus II Sister   . Healthy Son     x2   Social History  Substance Use Topics  . Smoking status: Former Smoker    Types: Cigarettes     Quit date: 11/07/1952  . Smokeless tobacco: Never Used  . Alcohol Use: No    Review of Systems 10 Systems reviewed and all are negative for acute change except as noted in the HPI.   Allergies  Review of patient's allergies indicates no known allergies.  Home Medications   Prior to Admission medications   Medication Sig Start Date End Date Taking? Authorizing Provider  aspirin 81 MG tablet Take 81 mg by mouth daily.   Yes Historical Provider, MD  diphenhydrAMINE (BENADRYL) 25 mg capsule Take 25 mg by mouth every 6 (six) hours as needed for allergies.   Yes Historical Provider, MD  loratadine (CLARITIN) 10 MG tablet Take 10 mg by mouth daily as needed for allergies.   Yes Historical Provider, MD  tamsulosin (FLOMAX) 0.4 MG CAPS capsule Take 0.4 mg by mouth every other day. 12/31/15  Yes Historical Provider, MD   BP 179/89 mmHg  Pulse 77  Temp(Src) 97.9 F (36.6 C) (Oral)  Resp 18  Ht 5\' 9"  (1.753 m)  Wt 150 lb (68.04 kg)  BMI 22.14 kg/m2  SpO2 100% Physical Exam  Constitutional: He is oriented to person, place, and time. He appears well-developed and well-nourished. No distress.  Awake, alert  HENT:  Head: Normocephalic.  Abrasion posterior scalp; superficial laceration to the nasal bridge near right  eyebrow  Eyes: Conjunctivae and EOM are normal. Pupils are equal, round, and reactive to light.  Neck: Normal range of motion. Neck supple.  Cardiovascular: Normal rate, regular rhythm and normal heart sounds.   No murmur heard. Pulmonary/Chest: Effort normal and breath sounds normal. No respiratory distress.  Abdominal: Soft. Bowel sounds are normal. He exhibits no distension.  Musculoskeletal: He exhibits no edema.  Mild tenderness of left clavicle without obvious deformity  Neurological: He is alert and oriented to person, place, and time. He has normal reflexes. No cranial nerve deficit. He exhibits normal muscle tone.  Fluent speech, normal finger-to-nose testing, negative  pronator drift; mild confusion; 5/5 strength and sensation in all four extremities  Skin: Skin is warm and dry.  Abrasion posterior scalp and laceration to nasal bridge  Psychiatric: He has a normal mood and affect. Judgment and thought content normal.  Nursing note and vitals reviewed.   ED Course  .Critical Care Performed by: Sharlett Iles Authorized by: Sharlett Iles Total critical care time: 30 minutes Critical care time was exclusive of separately billable procedures and treating other patients. Critical care was necessary to treat or prevent imminent or life-threatening deterioration of the following conditions: CNS failure or compromise. Critical care was time spent personally by me on the following activities: development of treatment plan with patient or surrogate, discussions with consultants, examination of patient, obtaining history from patient or surrogate, ordering and performing treatments and interventions, ordering and review of laboratory studies, ordering and review of radiographic studies and re-evaluation of patient's condition.   (including critical care time) DIAGNOSTIC STUDIES: Oxygen Saturation is 100% on RA, normal by my interpretation.    COORDINATION OF CARE: 11:16 PM-Discussed treatment plan which includes CT scan and labs with pt at bedside and pt agreed to plan.    Labs Review Labs Reviewed  I-STAT CHEM 8, ED - Abnormal; Notable for the following:    BUN 34 (*)    Creatinine, Ser 1.50 (*)    Glucose, Bld 111 (*)    Calcium, Ion 1.11 (*)    All other components within normal limits  PROTIME-INR  APTT  CBC  DIFFERENTIAL  COMPREHENSIVE METABOLIC PANEL  I-STAT TROPOININ, ED    Imaging Review Ct Head Wo Contrast  01/24/2016  CLINICAL DATA:  Infusion and fall. EXAM: CT HEAD WITHOUT CONTRAST CT CERVICAL SPINE WITHOUT CONTRAST TECHNIQUE: Multidetector CT imaging of the head and cervical spine was performed following the standard  protocol without intravenous contrast. Multiplanar CT image reconstructions of the cervical spine were also generated. COMPARISON:  None. FINDINGS: CT HEAD FINDINGS Skull and Sinuses:Posterior scalp hematoma without fracture or hemo sinus. Visualized orbits: Negative. Brain: Small subdural hematoma in the interhemispheric fissure, 3 mm in maximal thickness and without mass effect. There is also an elongated 8 mm hemorrhagic focus subependymal along the left caudate body. These are presumably from the patient's trauma. Small remote appearing left cerebellar infarct. Small nodular thickening along the anterior falx measuring up to 5 mm, possible incidental tiny meningioma. CT CERVICAL SPINE FINDINGS Negative for acute fracture or subluxation. No prevertebral edema. No gross cervical canal hematoma. Multilevel facet arthropathy with mild anterolisthesis at C5-6 and C7-T1. Diffuse degenerative disc narrowing and ridging. No indication of high-grade canal stenosis. These results were called by telephone at the time of interpretation on 01/24/2016 at 10:55 pm to Dr. Wendee Beavers, who verbally acknowledged these results. IMPRESSION: 1. Tiny interhemispheric subdural hematoma without mass effect. 2. 8 mm subependymal hemorrhage at the  left caudate body. 3. Posterior scalp hematoma without calvarial fracture. 4. Negative for cervical spine fracture. Electronically Signed   By: Monte Fantasia M.D.   On: 01/24/2016 22:59   Ct Cervical Spine Wo Contrast  01/24/2016  CLINICAL DATA:  Infusion and fall. EXAM: CT HEAD WITHOUT CONTRAST CT CERVICAL SPINE WITHOUT CONTRAST TECHNIQUE: Multidetector CT imaging of the head and cervical spine was performed following the standard protocol without intravenous contrast. Multiplanar CT image reconstructions of the cervical spine were also generated. COMPARISON:  None. FINDINGS: CT HEAD FINDINGS Skull and Sinuses:Posterior scalp hematoma without fracture or hemo sinus. Visualized orbits: Negative.  Brain: Small subdural hematoma in the interhemispheric fissure, 3 mm in maximal thickness and without mass effect. There is also an elongated 8 mm hemorrhagic focus subependymal along the left caudate body. These are presumably from the patient's trauma. Small remote appearing left cerebellar infarct. Small nodular thickening along the anterior falx measuring up to 5 mm, possible incidental tiny meningioma. CT CERVICAL SPINE FINDINGS Negative for acute fracture or subluxation. No prevertebral edema. No gross cervical canal hematoma. Multilevel facet arthropathy with mild anterolisthesis at C5-6 and C7-T1. Diffuse degenerative disc narrowing and ridging. No indication of high-grade canal stenosis. These results were called by telephone at the time of interpretation on 01/24/2016 at 10:55 pm to Dr. Wendee Beavers, who verbally acknowledged these results. IMPRESSION: 1. Tiny interhemispheric subdural hematoma without mass effect. 2. 8 mm subependymal hemorrhage at the left caudate body. 3. Posterior scalp hematoma without calvarial fracture. 4. Negative for cervical spine fracture. Electronically Signed   By: Monte Fantasia M.D.   On: 01/24/2016 22:59   I have personally reviewed and evaluated these lab results as part of my medical decision-making.   EKG Interpretation   Date/Time:  Sunday January 24 2016 22:48:11 EDT Ventricular Rate:  75 PR Interval:  164 QRS Duration: 91 QT Interval:  397 QTC Calculation: 443 R Axis:   -58 Text Interpretation:  Sinus rhythm Atrial premature complex LAD, consider  left anterior fascicular block Minimal ST elevation, anterior leads  Baseline wander in lead(s) I II aVR aVL aVF abnormal T wave in III,  possibly artifact, otherwise no significant change from previous Confirmed  by LITTLE MD, RACHEL [phone]) on 01/24/2016 11:05:51 PM      MDM   Final diagnoses:  ICH (intracerebral hemorrhage) ([place])   Shunt presents from home with confusion, possible head injury, left  clavicle pain after he complained of pain to his wife. He was initially called a code stroke by EMS but this was later canceled by neurology after his arrival. On exam, he was mildly confused and complaining of dizziness but with no other neurologic deficits. Small abrasion on back of head and nasal bridge. EKG unchanged from previous. EMS reported severe hypertension but the patient's initial blood pressure here was 179/89 and has improved during his stay. Labwork shows stable CK D with creatinine 1.5. Head CT shows small interhemispheric subdural hematoma as well as subependymal hemorrhage of left caudate body. Dr. Wendee Beavers with neurology has ordered brain MRI and will admit patient for hemorrhagic stroke. I have ordered CXR to evaluate L clavicle. Pt admitted for further work up. Tetanus updated. I personally performed the services described in this documentation, which was scribed in my presence. The recorded information has been reviewed and is accurate.   Sharlett Iles, MD 01/24/16 [phone]

## 2016-01-24 NOTE — Code Documentation (Addendum)
Casey Lee is an 82 yo wm presenting to [facility] via [facility] for acute confusion & neck pain. LKW 1800.  On initial eval by EMS he was found to have BP 300/220 (manual?) and unable to state month, year, or give an account of the evening but without any other deficits.  Subsequent BPs trended down to 180s/110s.  EMS called a code stroke and began transport to [facility] then discovered the pt to have abrasions to the back of his head and neck.  Both the pt & the wife denied a fall.  Upon arrival to [facility] the pt was able to state month, year, president, and give a bit more information about the evening though it appears he may have had some LOC due to gaps of lost time.  On eval he is able to MAEE, no droop, or sensory deficits, thus NIH 0.  CT head & neck obtained.  Code stroke canceled 2241.

## 2016-01-24 NOTE — ED Notes (Signed)
Pt in EMS initially called for neck pain and confusion. EMS called code stroke. EMS then end coded again stating that pt may have head injury instead, pressure 300/200, hematoma to back of head. Pt is A/OX4. C-collar in place

## 2016-01-24 NOTE — ED Notes (Signed)
Code Stroke was cancelled by Wendee Beavers MD

## 2016-01-25 ENCOUNTER — Inpatient Hospital Stay (HOSPITAL_COMMUNITY): Payer: Medicare Other

## 2016-01-25 DIAGNOSIS — S06350A Traumatic hemorrhage of left cerebrum without loss of consciousness, initial encounter: Secondary | ICD-10-CM

## 2016-01-25 DIAGNOSIS — I639 Cerebral infarction, unspecified: Secondary | ICD-10-CM

## 2016-01-25 DIAGNOSIS — I10 Essential (primary) hypertension: Secondary | ICD-10-CM

## 2016-01-25 DIAGNOSIS — E785 Hyperlipidemia, unspecified: Secondary | ICD-10-CM

## 2016-01-25 DIAGNOSIS — I6789 Other cerebrovascular disease: Secondary | ICD-10-CM | POA: Diagnosis not present

## 2016-01-25 DIAGNOSIS — I638 Other cerebral infarction: Secondary | ICD-10-CM | POA: Diagnosis not present

## 2016-01-25 DIAGNOSIS — Z23 Encounter for immunization: Secondary | ICD-10-CM | POA: Diagnosis not present

## 2016-01-25 DIAGNOSIS — G9340 Encephalopathy, unspecified: Secondary | ICD-10-CM | POA: Diagnosis not present

## 2016-01-25 DIAGNOSIS — S065X9A Traumatic subdural hemorrhage with loss of consciousness of unspecified duration, initial encounter: Secondary | ICD-10-CM | POA: Diagnosis not present

## 2016-01-25 DIAGNOSIS — N179 Acute kidney failure, unspecified: Secondary | ICD-10-CM | POA: Diagnosis not present

## 2016-01-25 LAB — LIPID PANEL
CHOL/HDL RATIO: 4.1 ratio
Cholesterol: 203 mg/dL — ABNORMAL HIGH (ref 0–200)
HDL: 50 mg/dL (ref >40)
LDL CALC: 142 mg/dL — AB (ref 0–99)
Triglycerides: 54 mg/dL (ref <150)
VLDL: 11 mg/dL (ref 0–40)

## 2016-01-25 LAB — CBC
HEMATOCRIT: 40.4 % (ref 39.0–52.0)
Hemoglobin: 13.3 g/dL (ref 13.0–17.0)
MCH: 29.8 pg (ref 26.0–34.0)
MCHC: 32.9 g/dL (ref 30.0–36.0)
MCV: 90.4 fL (ref 78.0–100.0)
PLATELETS: 153 10*3/uL (ref 150–400)
RBC: 4.47 MIL/uL (ref 4.22–5.81)
RDW: 13.4 % (ref 11.5–15.5)
WBC: 9.5 10*3/uL (ref 4.0–10.5)

## 2016-01-25 LAB — ECHOCARDIOGRAM COMPLETE
Height: 69 in
WEIGHTICAEL: 2268.09 [oz_av]

## 2016-01-25 LAB — TROPONIN I
TROPONIN I: 0.04 ng/mL — AB (ref <0.031)
Troponin I: 0.05 ng/mL — ABNORMAL HIGH (ref <0.031)
Troponin I: 0.05 ng/mL — ABNORMAL HIGH (ref <0.031)

## 2016-01-25 LAB — BASIC METABOLIC PANEL
Anion gap: 12 (ref 5–15)
BUN: 23 mg/dL — AB (ref 6–20)
CO2: 21 mmol/L — AB (ref 22–32)
Calcium: 9 mg/dL (ref 8.9–10.3)
Chloride: 102 mmol/L (ref 101–111)
Creatinine, Ser: 1.27 mg/dL — ABNORMAL HIGH (ref 0.61–1.24)
GFR calc Af Amer: 58 mL/min — ABNORMAL LOW (ref >60)
GFR, EST NON AFRICAN AMERICAN: 50 mL/min — AB (ref >60)
GLUCOSE: 141 mg/dL — AB (ref 65–99)
POTASSIUM: 3.9 mmol/L (ref 3.5–5.1)
Sodium: 135 mmol/L (ref 135–145)

## 2016-01-25 LAB — POCT I-STAT TROPONIN I: Troponin i, poc: 0.01 ng/mL (ref 0.00–0.08)

## 2016-01-25 LAB — AMMONIA: Ammonia: 24 umol/L (ref 9–35)

## 2016-01-25 LAB — VITAMIN B12: VITAMIN B 12: 311 pg/mL (ref 180–914)

## 2016-01-25 LAB — TSH: TSH: 2.269 u[IU]/mL (ref 0.350–4.500)

## 2016-01-25 LAB — MRSA PCR SCREENING: MRSA by PCR: NEGATIVE

## 2016-01-25 LAB — FOLATE: Folate: 14.3 ng/mL (ref >5.9)

## 2016-01-25 MED ORDER — HALOPERIDOL LACTATE 5 MG/ML IJ SOLN
5.0000 mg | Freq: Once | INTRAMUSCULAR | Status: AC
  Administered 2016-01-25: 5 mg via INTRAVENOUS
  Filled 2016-01-25: qty 1

## 2016-01-25 MED ORDER — SENNOSIDES-DOCUSATE SODIUM 8.6-50 MG PO TABS
1.0000 | ORAL_TABLET | Freq: Two times a day (BID) | ORAL | Status: DC
  Administered 2016-01-25 – 2016-01-27 (×5): 1 via ORAL
  Filled 2016-01-25 (×5): qty 1

## 2016-01-25 MED ORDER — ACETAMINOPHEN 650 MG RE SUPP: 650.0000 mg | RECTAL | Status: DC | PRN

## 2016-01-25 MED ORDER — ACETAMINOPHEN 325 MG PO TABS
650.0000 mg | ORAL_TABLET | ORAL | Status: DC | PRN
  Filled 2016-01-25: qty 2

## 2016-01-25 MED ORDER — STROKE: EARLY STAGES OF RECOVERY BOOK
Freq: Once | Status: AC
  Administered 2016-01-25: 06:00:00
  Filled 2016-01-25: qty 1

## 2016-01-25 MED ORDER — PANTOPRAZOLE SODIUM 40 MG IV SOLR
40.0000 mg | Freq: Every day | INTRAVENOUS | Status: DC
  Administered 2016-01-25: 40 mg via INTRAVENOUS
  Filled 2016-01-25: qty 40

## 2016-01-25 MED ORDER — ATORVASTATIN CALCIUM 20 MG PO TABS
20.0000 mg | ORAL_TABLET | Freq: Every day | ORAL | Status: DC
Start: 2016-01-26 — End: 2016-01-28
  Filled 2016-01-25: qty 1

## 2016-01-25 MED ORDER — TRAMADOL HCL 50 MG PO TABS
50.0000 mg | ORAL_TABLET | Freq: Four times a day (QID) | ORAL | Status: DC | PRN
  Administered 2016-01-25 (×2): 50 mg via ORAL
  Filled 2016-01-25 (×2): qty 1

## 2016-01-25 MED ORDER — LABETALOL HCL 5 MG/ML IV SOLN
10.0000 mg | INTRAVENOUS | Status: DC | PRN
Start: 2016-01-25 — End: 2016-01-28
  Administered 2016-01-25 – 2016-01-27 (×2): 10 mg via INTRAVENOUS
  Filled 2016-01-25 (×2): qty 4

## 2016-01-25 NOTE — Progress Notes (Signed)
  Echocardiogram 2D Echocardiogram has been performed.  Casey Lee 01/25/2016, 11:03 AM

## 2016-01-25 NOTE — Progress Notes (Signed)
This RN notified [name], NP this AM of left shoulder pain, new order received for xray. Xray negative. New order received for tramadol. Will give to patient. Patient c/o persistent dizziness/fatigue. Vitals & neuro checks WNL. Will continue to monitor patient closely.

## 2016-01-25 NOTE — ED Notes (Signed)
MRI called stating that pt is "confused making no sense" EDP has been informed. MRI also stated that they would be paging Wendee Beavers MD (neurologist)

## 2016-01-25 NOTE — Progress Notes (Signed)
STROKE TEAM PROGRESS NOTE   HISTORY OF PRESENT ILLNESS Casey Lee is a 82 y.o. male with hx of HTN who was at home in his [facility] when he got up from bed and felt lightheaded and apparently felt lightheaded on the way to the bathroom. He denies LOC but he does not remember falling or even waking up on the floor - essentially denies LOC but at this time has definite anterograde and retrograde amnesia around the time of the fall. Denies dyaphoresis, blurred vision before the fall. He was brought in as a code stroke because of confusion. In the CT scanner it became obvious that the patient had no neurological symptoms and thus I cancelled the code stroke. I am admitting the pt instead as the CT showed two small hemorrhagic spots, one ICH and one SDH. Denied any reason for being dehydrated. Denied ETOH. Denied hx of sz. ICH Score: 1. Patient was not administered IV t-PA secondary to hemorrhage. He was admitted for further evaluation and treatment.   SUBJECTIVE (INTERVAL HISTORY) No family is at the bedside.  Patient at bedside, on the phone.  He recounted the story, but it was different from his wife's story when she arrived. He doesn't remember falling and he doesn't know if he passed out or not. Overall he feels his condition is resolved except left shoulder pain. He still has some mild confusion. Wife, Bertram Millard arrived during rounds.   OBJECTIVE Temp:  [96.7 F (35.9 C)-97.9 F (36.6 C)] 97.6 F (36.4 C) (04/24 0823) Pulse Rate:  [76-93] 93 (04/24 0800) Cardiac Rhythm:  [-] Normal sinus rhythm (04/24 0700) Resp:  [15-33] 21 (04/24 0800) BP: (135-179)/(75-97) 135/75 mmHg (04/24 0823) SpO2:  [95 %-100 %] 100 % (04/24 0800) Weight:  [64.3 kg (141 lb 12.1 oz)-68.04 kg (150 lb)] 64.3 kg (141 lb 12.1 oz) (04/24 0206)  CBC:   Recent Labs Lab 01/24/16 2235 01/24/16 2239 01/25/16 0710  WBC 9.2  --  9.5  NEUTROABS 6.5  --   --   HGB 13.9 15.0 13.3  HCT 41.5 44.0 40.4  MCV 89.8  --  90.4   PLT 159  --  0000000    Basic Metabolic Panel:   Recent Labs Lab 01/24/16 2235 01/24/16 2239 01/25/16 0710  NA 136 138 135  K 3.7 3.9 3.9  CL 102 104 102  CO2 20*  --  21*  GLUCOSE 117* 111* 141*  BUN 28* 34* 23*  CREATININE 1.50* 1.50* 1.27*  CALCIUM 9.4  --  9.0    Lipid Panel:     Component Value Date/Time   CHOL 203* 01/25/2016 0710   TRIG 54 01/25/2016 0710   HDL 50 01/25/2016 0710   CHOLHDL 4.1 01/25/2016 0710   VLDL 11 01/25/2016 0710   LDLCALC 142* 01/25/2016 0710   HgbA1c: No results found for: HGBA1C Urine Drug Screen: No results found for: LABOPIA, COCAINSCRNUR, LABBENZ, AMPHETMU, THCU, LABBARB    IMAGING I have personally reviewed the radiological images below and agree with the radiology interpretations.  Dg Chest 2 View 01/25/2016  No acute finding.   Ct Head Wo Contrast 01/25/2016   1. Tiny interhemispheric subdural hematoma again noted on the left side, near the convexity, relatively stable in appearance. 2. Apparent small 5 mm subependymal hemorrhage again noted at the left caudate body. 3. Mild cortical volume loss and scattered small vessel ischemic microangiopathy. 4. Soft tissue swelling overlying the left occiput.  Ct Head Wo Contrast 01/24/2016   1. Tiny  interhemispheric subdural hematoma without mass effect. 2. 8 mm subependymal hemorrhage at the left caudate body. 3. Posterior scalp hematoma without calvarial fracture.   Ct Cervical Spine Wo Contrast 01/24/2016   Negative for cervical spine fracture.   Mr Brain Ltd W/o Cm 01/25/2016   1. Grossly stable subcentimeter subependymal hemorrhage centered at the left caudate body as well as tiny left parafalcine subdural hematoma. These are better evaluated prior CT from earlier the same day. 2. Tiny 4 mm foci of restricted diffusion within the right periatrial white matter, consistent with acute small vessel ischemia. 3. Left parieto-occipital scalp contusion. 4. Two tiny remote left cerebellar infarcts.  5. Age-related cerebral atrophy with mild chronic small vessel ischemic disease.   Carotid Doppler   There is 1-39% bilateral ICA stenosis. Vertebral artery flow is antegrade.    TTE - - Left ventricle: The cavity size was normal. Wall thickness was  normal. Systolic function was normal. The estimated ejection  fraction was in the range of 55% to 60%. Wall motion was normal;  there were no regional wall motion abnormalities. Doppler  parameters are consistent with abnormal left ventricular  relaxation (grade 1 diastolic dysfunction). Impressions: - Normal LV systolic function; grade 1 diastolic dysfunction; trace  MR and TR.  EEG - This is a normal drowsy and asleep EEG for the patients stated age. There were no focal, hemispheric or lateralizing features. No epileptiform activity was recorded. A normal EEG does not exclude the diagnosis of a seizure disorder    PHYSICAL EXAM  Temp:  [96.7 F (35.9 C)-98.5 F (36.9 C)] 97.4 F (36.3 C) (04/24 2000) Pulse Rate:  [65-93] 65 (04/24 2000) Resp:  [15-33] 18 (04/24 2000) BP: (127-179)/(51-137) 135/70 mmHg (04/24 2000) SpO2:  [95 %-100 %] 97 % (04/24 2000) Weight:  [141 lb 12.1 oz (64.3 kg)-150 lb (68.04 kg)] 141 lb 12.1 oz (64.3 kg) (04/24 0206)  General - Well nourished, well developed, in no apparent distress.  Ophthalmologic - Fundi not visualized due to noncooperation.  Cardiovascular - Regular rate and rhythm.  Mental Status -  Level of arousal and orientation to time, place, and person were intact. Language including expression, naming, repetition, comprehension was assessed and found intact. Fund of Knowledge was assessed and was intact.  Cranial Nerves II - XII - II - Visual field intact OU. III, IV, VI - Extraocular movements intact. V - Facial sensation intact bilaterally. VII - Facial movement intact bilaterally. VIII - Hearing & vestibular intact bilaterally. X - Palate elevates symmetrically. XI - Chin  turning & shoulder shrug intact bilaterally. XII - Tongue protrusion intact.  Motor Strength - The patient's strength was normal in all extremities and pronator drift was absent.  Bulk was normal and fasciculations were absent.   Motor Tone - Muscle tone was assessed at the neck and appendages and was normal.  Reflexes - The patient's reflexes were 1+ in all extremities and he had no pathological reflexes.  Sensory - Light touch, temperature/pinprick were assessed and were symmetrical.    Coordination - The patient had normal movements in the hands and feet with no ataxia or dysmetria.  Tremor was absent.  Gait and Station - not tested as echo tech is doing test.   ASSESSMENT/PLAN Mr. Casey Lee is a 82 y.o. male with history of hypertension who fell and ? passed out ? loss of consciousness at home with resultant confusion. CT of the head showed ICH and SDH.   ? Fall ? Syncope ?  Traumatic L parafalcine SDH Tiny L caudate body hemorrhage, etiology unclear, ? Traumatic vs. ? hypertension  Resultant  Mild confusion, L should pain  CT tiny interhemispheric subdural hematoma without mass effect. 8 mm left caudate body subependymal ICH  MRI  Stable subependymal left caudate body ICH and tiny left parafalcine SDH, right peri-ventricular punctate DWI restriction, Two old tiny left cerebellar infarcts   Repeat CT head in am  Carotid Doppler  No significant stenosis   2D Echo  EF 55-60%  X-rays left shoulder negative for fracture. Tylenol or Ultram when necessary.  EEG normal   LDL 142  HgbA1c pending  SCDs for VTE prophylaxis Diet Heart Room service appropriate?: Yes; Fluid consistency:: Thin  aspirin 81 mg daily prior to admission, no antithrombotics due to [place]  Ongoing aggressive stroke risk factor management  Therapy recommendations:  pending   Disposition:  pending   Hypertension  No hx  Elevated in hospital, 150-180s  Hyperlipidemia  Home meds:  No  statin  LDL 142, goal < 70  Add statin 20mg  daily  continue statin at discharge  Other Stroke Risk Factors  Advanced age  Former Cigarette smoker, quit smoking 63 years ago   Other Active Problems  Hearing loss  Chest pain resolved last night per wife  Hospital day # 1  Rosalin Hawking, MD PhD Stroke Neurology 01/25/2016 10:48 PM   To contact Stroke Continuity provider, please refer to [email or web address] After hours, contact General Neurology

## 2016-01-25 NOTE — Progress Notes (Signed)
PT Cancellation Note  Patient Details Name: Casey Lee MRN: [id number] DOB: Dec 27, 1927   Cancelled Treatment:    Reason Eval/Treat Not Completed: Patient not medically ready. Noted further neurological decline overnight and current bedrest order.  Will follow-up 4/25 to see if patient is appropriate for PT eval and incr activity.   Simone Tuckey 01/25/2016, 8:34 AM  Pager [phone]

## 2016-01-25 NOTE — Procedures (Signed)
HPI:  82 y/o with MS change found to have ICH/SDH.    TECHNICAL SUMMARY:  A multichannel referential and bipolar montage EEG using the standard international 10-20 system was performed on the patient described as drowsy.  The dominant background activity consists of 9 hertz activity seen most prominantly over the posterior head region.  Low voltage fast (beta) activity is distributed symmetrically and maximally over the anterior head regions.  There is a moderate amount of artifact throughout the recording.  ACTIVATION:  Stepwise photic stimulation and hyperventilation were not performed.  EPILEPTIFORM ACTIVITY:  There were no spikes, sharp waves or paroxysmal activity.  SLEEP:  Much of the recording is spent in stage I and stage II sleep architecture  CARDIAC:  The EKG lead was not well recorded.  IMPRESSION:  This is a normal drowsy and asleep EEG for the patients stated age.  There were no focal, hemispheric or lateralizing features.  No epileptiform activity was recorded.  A normal EEG does not exclude the diagnosis of a seizure disorder and if seizure remains high on the list of differential diagnosis, an ambulatory EEG may be of value.  Clinical correlation is required.

## 2016-01-25 NOTE — Progress Notes (Signed)
VASCULAR LAB PRELIMINARY  PRELIMINARY  PRELIMINARY  PRELIMINARY  Carotid duplex completed.     Bilateral: 1-39% ICA stenosis. Vertebral artery antegrade.  Alletta Mattos, RVT, RDMS 01/25/2016, 1:03 PM

## 2016-01-25 NOTE — Progress Notes (Signed)
Bedside EEG completed, results pending. 

## 2016-01-25 NOTE — ED Notes (Signed)
Wendee Beavers MD has been paged, EDP at bedside. Pt was returned from MRI earlier for change in mental status. Upon arrival pt making no sense, repeating his name, states that his chest hurts. EKG was obtained, i-stat trop ordered. Vega MD en route to see pt. Haldol was admin to settle pt down and pt will be taken for stat head CT

## 2016-01-26 ENCOUNTER — Inpatient Hospital Stay (HOSPITAL_COMMUNITY): Payer: Medicare Other

## 2016-01-26 DIAGNOSIS — H8110 Benign paroxysmal vertigo, unspecified ear: Secondary | ICD-10-CM

## 2016-01-26 DIAGNOSIS — I63311 Cerebral infarction due to thrombosis of right middle cerebral artery: Secondary | ICD-10-CM

## 2016-01-26 LAB — BASIC METABOLIC PANEL
ANION GAP: 10 (ref 5–15)
BUN: 19 mg/dL (ref 6–20)
CHLORIDE: 99 mmol/L — AB (ref 101–111)
CO2: 21 mmol/L — ABNORMAL LOW (ref 22–32)
Calcium: 8.8 mg/dL — ABNORMAL LOW (ref 8.9–10.3)
Creatinine, Ser: 1.3 mg/dL — ABNORMAL HIGH (ref 0.61–1.24)
GFR, EST AFRICAN AMERICAN: 56 mL/min — AB (ref >60)
GFR, EST NON AFRICAN AMERICAN: 48 mL/min — AB (ref >60)
Glucose, Bld: 119 mg/dL — ABNORMAL HIGH (ref 65–99)
POTASSIUM: 4.1 mmol/L (ref 3.5–5.1)
SODIUM: 130 mmol/L — AB (ref 135–145)

## 2016-01-26 LAB — CBC
HCT: 37.9 % — ABNORMAL LOW (ref 39.0–52.0)
HEMOGLOBIN: 12.8 g/dL — AB (ref 13.0–17.0)
MCH: 30.3 pg (ref 26.0–34.0)
MCHC: 33.8 g/dL (ref 30.0–36.0)
MCV: 89.6 fL (ref 78.0–100.0)
PLATELETS: 153 10*3/uL (ref 150–400)
RBC: 4.23 MIL/uL (ref 4.22–5.81)
RDW: 13.6 % (ref 11.5–15.5)
WBC: 8.1 10*3/uL (ref 4.0–10.5)

## 2016-01-26 LAB — HEMOGLOBIN A1C
Hgb A1c MFr Bld: 6.3 % — ABNORMAL HIGH (ref 4.8–5.6)
Mean Plasma Glucose: 134 mg/dL

## 2016-01-26 MED ORDER — MECLIZINE HCL 12.5 MG PO TABS
12.5000 mg | ORAL_TABLET | Freq: Three times a day (TID) | ORAL | Status: DC
  Administered 2016-01-26 – 2016-01-28 (×6): 12.5 mg via ORAL
  Filled 2016-01-26 (×7): qty 1

## 2016-01-26 MED ORDER — SODIUM CHLORIDE 0.9 % IV SOLN
INTRAVENOUS | Status: DC
  Administered 2016-01-26: 20:00:00 via INTRAVENOUS

## 2016-01-26 MED ORDER — ONDANSETRON HCL 4 MG/2ML IJ SOLN
4.0000 mg | Freq: Four times a day (QID) | INTRAMUSCULAR | Status: DC | PRN
  Administered 2016-01-26 – 2016-01-27 (×3): 4 mg via INTRAVENOUS
  Filled 2016-01-26 (×3): qty 2

## 2016-01-26 MED ORDER — PANTOPRAZOLE SODIUM 40 MG PO TBEC
40.0000 mg | DELAYED_RELEASE_TABLET | Freq: Every day | ORAL | Status: DC
  Administered 2016-01-26 – 2016-01-27 (×2): 40 mg via ORAL
  Filled 2016-01-26 (×2): qty 1

## 2016-01-26 MED ORDER — ONDANSETRON HCL 4 MG/2ML IJ SOLN: 4.0000 mg | Freq: Four times a day (QID) | INTRAMUSCULAR | Status: DC | PRN

## 2016-01-26 MED ORDER — MECLIZINE HCL 12.5 MG PO TABS
12.5000 mg | ORAL_TABLET | Freq: Three times a day (TID) | ORAL | Status: DC | PRN
  Administered 2016-01-26: 12.5 mg via ORAL
  Filled 2016-01-26 (×2): qty 1

## 2016-01-26 NOTE — Progress Notes (Signed)
STROKE TEAM PROGRESS NOTE   SUBJECTIVE (INTERVAL HISTORY) Wife and son at the bedside. They shared he "lost his breakfast" - was not hungry, ate, then threw up. Patient complains of dizziness which forces him to lay down. wife reports a history of vertigo, seen by neurology. Formerly diagnosed with BPPV. Patient has been given maneuver training and aware of exercises that will help.  During round, dix-hallpike maneuver done only on right side and showed positive with vertigo episode, N/V, eyes closed and not able to see the nystagmus. Pt vertigo consistent with BPPV at right posterior semicircular canal. Left side not tested due to N/V. Will discuss with PT regarding maneuver training and therapy.   OBJECTIVE Temp:  [97.4 F (36.3 C)-98.5 F (36.9 C)] 97.6 F (36.4 C) (04/25 0747) Pulse Rate:  [60-81] 60 (04/25 0747) Cardiac Rhythm:  [-] Normal sinus rhythm (04/25 0710) Resp:  [15-23] 15 (04/25 0747) BP: (123-179)/(51-137) 147/77 mmHg (04/25 0747) SpO2:  [95 %-99 %] 96 % (04/25 0747)  CBC:   Recent Labs Lab 01/24/16 2235  01/25/16 0710 01/26/16 0338  WBC 9.2  --  9.5 8.1  NEUTROABS 6.5  --   --   --   HGB 13.9  < > 13.3 12.8*  HCT 41.5  < > 40.4 37.9*  MCV 89.8  --  90.4 89.6  PLT 159  --  153 153  < > = values in this interval not displayed.  Basic Metabolic Panel:   Recent Labs Lab 01/25/16 0710 01/26/16 0338  NA 135 130*  K 3.9 4.1  CL 102 99*  CO2 21* 21*  GLUCOSE 141* 119*  BUN 23* 19  CREATININE 1.27* 1.30*  CALCIUM 9.0 8.8*    Lipid Panel:     Component Value Date/Time   CHOL 203* 01/25/2016 0710   TRIG 54 01/25/2016 0710   HDL 50 01/25/2016 0710   CHOLHDL 4.1 01/25/2016 0710   VLDL 11 01/25/2016 0710   LDLCALC 142* 01/25/2016 0710   HgbA1c:  Lab Results  Component Value Date   HGBA1C 6.3* 01/25/2016    IMAGING I have personally reviewed the radiological images below and agree with the radiology interpretations.  Dg Chest 2 View 01/25/2016   No acute finding.   Ct Head Wo Contrast 01/26/2016 1. Slight interval decrease in conspicuity of 5 mm subependymal hemorrhage centered at the left caudate body. 2. Interval decrease in small volume acute left parafalcine subdural hematoma. No mass effect. 3. No new intracranial process. 4. Evolving left posterior scalp contusion.  01/25/2016   1. Tiny interhemispheric subdural hematoma again noted on the left side, near the convexity, relatively stable in appearance. 2. Apparent small 5 mm subependymal hemorrhage again noted at the left caudate body. 3. Mild cortical volume loss and scattered small vessel ischemic microangiopathy. 4. Soft tissue swelling overlying the left occiput.  01/24/2016   1. Tiny interhemispheric subdural hematoma without mass effect. 2. 8 mm subependymal hemorrhage at the left caudate body. 3. Posterior scalp hematoma without calvarial fracture.   Ct Cervical Spine Wo Contrast 01/24/2016   Negative for cervical spine fracture.   Mr Brain Ltd W/o Cm 01/25/2016   1. Grossly stable subcentimeter subependymal hemorrhage centered at the left caudate body as well as tiny left parafalcine subdural hematoma. These are better evaluated prior CT from earlier the same day. 2. Tiny 4 mm foci of restricted diffusion within the right periatrial white matter, consistent with acute small vessel ischemia. 3. Left parieto-occipital scalp contusion. 4.  Two tiny remote left cerebellar infarcts. 5. Age-related cerebral atrophy with mild chronic small vessel ischemic disease.   Carotid Doppler   There is 1-39% bilateral ICA stenosis. Vertebral artery flow is antegrade.    TTE  - Left ventricle: The cavity size was normal. Wall thickness was normal. Systolic function was normal. The estimated ejection fraction was in the range of 55% to 60%. Wall motion was normal; there were no regional wall motion abnormalities. Doppler parameters are consistent with abnormal left ventricular relaxation (grade 1  diastolic dysfunction). Impressions:   Normal LV systolic function; grade 1 diastolic dysfunction; trace MR and TR.  EEG - This is a normal drowsy and asleep EEG for the patients stated age. There were no focal, hemispheric or lateralizing features. No epileptiform activity was recorded. A normal EEG does not exclude the diagnosis of a seizure disorder   L shoulder xray 01/25/2016 negative   PHYSICAL EXAM General - Well nourished, well developed, acute distress of N/V and dizziness.  Ophthalmologic - Fundi not visualized due to noncooperation.  Cardiovascular - Regular rate and rhythm.  Mental Status -  Level of arousal and orientation to time, place, and person were intact. Language including expression, naming, repetition, comprehension was assessed and found intact. Fund of Knowledge was assessed and was intact.  Cranial Nerves II - XII - II - Visual field intact OU. III, IV, VI - Extraocular movements intact. V - Facial sensation intact bilaterally. VII - Facial movement intact bilaterally. VIII - Hearing & vestibular intact bilaterally. X - Palate elevates symmetrically. XI - Chin turning & shoulder shrug intact bilaterally. XII - Tongue protrusion intact.  Motor Strength - The patient's strength was normal in all extremities and pronator drift was absent.  Bulk was normal and fasciculations were absent.   Motor Tone - Muscle tone was assessed at the neck and appendages and was normal.  Reflexes - The patient's reflexes were 1+ in all extremities and he had no pathological reflexes.  Sensory - Light touch, temperature/pinprick were assessed and were symmetrical.    Coordination - The patient had normal movements in the hands and feet with no ataxia or dysmetria.  Tremor was absent.  Gait and Station - not tested as echo tech is doing test.  Dix-hallpike testing on the right side (left side not tested) - positive for vertigo, N/V, pt anxious and eyes closed and  nystagmus not able to be seen.   ASSESSMENT/PLAN Casey Lee is a 82 y.o. male with history of hypertension who fell and ? passed out ? loss of consciousness at home with resultant confusion. CT of the head showed ICH and SDH.   BPPV exacerbation   known hx  Resultant nausea and vomiting and vertigo  Discussed with PT Ms. White regarding vestibular PT and maneuver training  Needs to do exercises at home 4-5/day and continue for one month   Scheduled meclizine and PRN zofran  Traumatic L parafalcine SDH  Tiny L caudate body hemorrhage, etiology unclear, ? Traumatic vs. ? Hypertension  Resultant  Mild confusion  CT tiny interhemispheric SDH and small left caudate body subependymal ICH  MRI  Stable subependymal left caudate body ICH and tiny left parafalcine SDH  Repeat CT head 4/25 stable with hmgs resolving. No new abnormality  X-rays left shoulder negative for fracture. Tylenol or Ultram when necessary.  EEG normal   SCDs for VTE prophylaxis Diet Heart Room service appropriate?: Yes; Fluid consistency:: Thin  aspirin 81 mg daily prior  to admission, no antithrombotics due to [place]. Anticipate resumption in 1 month or so  Repeat CT head in future to assure hemorrhage resolution  Therapy recommendations:  pending   Disposition:  pending   Stroke: incidental finding, right peri-ventricular punctate infarct, likely small vessel disease  MRI brain - DWI restriction adjacent to right occipital horn and two old tiny left cerebellar infarcts   Carotid Doppler  No significant stenosis   2D Echo  EF 55-60%  LDL 142  HgbA1c 6.3  aspirin 81 mg daily prior to admission, no antithrombotics due to [place]. Anticipate resumption in 1 month   Hypertension  No hx  Elevated in hospital, improved to 120-140s today  Monitor as an OP  Hyperlipidemia  Home meds:  No statin  LDL 142, goal < 70  Add statin 20mg  daily  continue statin at discharge  Other Stroke  Risk Factors  Advanced age  Former Cigarette smoker, quit smoking 63 years ago   Other Active Problems  Hearing loss  Chest pain resolved last night per wife  Slight elevation in troponins 0.04, 0.05, 0.05  Hx allergic rhinitis   Hospital day # 2  Rosalin Hawking, MD PhD Stroke Neurology 01/26/2016 2:08 PM   To contact Stroke Continuity provider, please refer to [email or web address] After hours, contact General Neurology

## 2016-01-26 NOTE — Progress Notes (Signed)
SLP Cancellation Note  Patient Details Name: Casey Lee MRN: [id number] DOB: 24-Jan-1928   Cancelled treatment:       Pt/family state that he is at baseline and no evaluation is desired. Will sign off.   Juan Quam Laurice 01/26/2016, 1:15 PM

## 2016-01-26 NOTE — Evaluation (Signed)
Physical Therapy Evaluation Patient Details Name: NAZAIRE SLAYBAUGH MRN: [id number] DOB: 1927-10-17 Today's Date: 01/26/2016   History of Present Illness  Casey Lee is a 82 y.o. male with hx of HTN who was at home in his US[facility] when he got up from bed and felt lightheaded and apparently felt lightheaded on the way to the bathroom. He denies LOC but he does not remember falling or even waking up on the floor - essentially denies LOC but at this time has definite anterograde and retrograde amnesia around the time of the fall. Denies dyaphoresis, blurred vision before the fall. He was brought in as a code stroke because of confusion. In the CT scanner it became obvious that the patient had no neurological symptoms and thus I cancelled the code stroke. I am admitting the pt instead as the CT showed two small hemorrhagic spots, one ICH and one SDH. Denied any reason for being dehydrated. Denied ETOH. Denied hx of sz. ICH Score: 1. Patient was not administered IV t-PA secondary to hemorrhage. He was admitted for further evaluation and treatment.  Clinical Impression  Pt admitted with above diagnosis. Pt currently with functional limitations due to the deficits listed below (see PT Problem List). Pt too sick to do much with PT.  Attempted assessment but pt too sick to continue.  Provided incr education to pt and family, spoke with MD regarding medications for dizziness and provided handouts to pt.  Will come back inam to hopefully have better success with continued evaluation and treatment.   Pt will benefit from skilled PT to increase their independence and safety with mobility to allow discharge to the venue listed below.    Follow Up Recommendations Supervision/Assistance - 24 hour;Outpatient PT vestibular rehab    Equipment Recommendations  Other (comment) (TBA)    Recommendations for Other Services       Precautions / Restrictions Precautions Precautions: Fall Restrictions Weight  Bearing Restrictions: No      Mobility  Bed Mobility Overal bed mobility: Independent             General bed mobility comments: Pt not agreeable to much but did sit up to drink some water.   Transfers                    Ambulation/Gait                Stairs            Wheelchair Mobility    Modified Rankin (Stroke Patients Only)       Balance Overall balance assessment: Needs assistance Sitting-balance support: No upper extremity supported;Feet supported Sitting balance-Leahy Scale: Fair Sitting balance - Comments: ABle to sit EOB and drink water.                                     Pertinent Vitals/Pain Pain Assessment: No/denies pain  VSS    Home Living Family/patient expects to be discharged to:: Private residence Living Arrangements: Spouse/significant other Available Help at Discharge: Family;Available 24 hours/day Type of Home: House Home Access: Stairs to enter Entrance Stairs-Rails: None Entrance Stairs-Number of Steps: 4 Home Layout: Two level;Full bath on main level;Able to live on main level with bedroom/bathroom (pts office is downstairs) Home Equipment: Walker - 2 wheels;Cane - single point      Prior Function Level of Independence: Independent  Hand Dominance        Extremity/Trunk Assessment   Upper Extremity Assessment: Defer to OT evaluation           Lower Extremity Assessment: Overall WFL for tasks assessed      Cervical / Trunk Assessment: Normal  Communication   Communication: No difficulties  Cognition Arousal/Alertness: Awake/alert Behavior During Therapy: Flat affect Overall Cognitive Status: Within Functional Limits for tasks assessed                      General Comments General comments (skin integrity, edema, etc.): Attempted vestibular testing.  On arrival, pt and family report that pt had vomited earlier after MD had moved pt around after  breakfast.  Pt had Zofran earlier but still nauseated.  Attempted testing with pt with symptoms suspicious for right BPPV however pt would not allow PT to perform repositioning as pt got nauseaous with bed movement.  Informed pt that the maneuver could fix the symptoms and got cold washcloth as well.  Pt continued to refuse.  Asked nursing to bring Meclizine and she did so.  MD called this PT later and this PT infomed MD re: pts inability to tolerate manuevers and MD is in agreement to order Meclizine 3xday not prn and for pt to have three doses today so that hopefully he can tolerate manuevers tomorrow.  Gave pt, wife and son handouts regarding Epley maneuver and BPPV symptoms and treatment.      Exercises        Assessment/Plan    PT Assessment Patient needs continued PT services  PT Diagnosis  (dizziness)   PT Problem List Decreased balance;Decreased activity tolerance;Decreased mobility;Decreased knowledge of use of DME;Decreased safety awareness;Decreased knowledge of precautions (dizziness)  PT Treatment Interventions DME instruction;Gait training;Functional mobility training;Therapeutic activities;Therapeutic exercise;Stair training;Balance training;Patient/family education (vestibular treatment)   PT Goals (Current goals can be found in the Care Plan section) Acute Rehab PT Goals Patient Stated Goal: to get better PT Goal Formulation: With patient Time For Goal Achievement: 02/09/16 Potential to Achieve Goals: Good    Frequency Min 3X/week   Barriers to discharge        Co-evaluation               End of Session   Activity Tolerance: Patient limited by fatigue (limited by dizziness) Patient left: in bed;with call bell/phone within reach;with family/visitor present;with nursing/sitter in room Nurse Communication: Mobility status         Time: [id number] PT Time Calculation (min) (ACUTE ONLY): 39 min   Charges:   PT Evaluation $PT Eval Moderate Complexity: 1  Procedure PT Treatments $Therapeutic Activity: 8-22 mins $Self Care/Home Management: 8-22   PT G CodesDenice Paradise 02-17-16, 3:29 PM Mayzie Caughlin,PT Acute Rehabilitation [phone] (pager)

## 2016-01-26 NOTE — Clinical Documentation Improvement (Signed)
Neurology  Please document query responses in the progress notes and discharge summary, not on the CDI BPA form.  Thank you.  Please document the clinical significance, if any, regarding the MRI results dated 01/25/16 noted below:  "Tiny 4 mm foci of restricted diffusion within the right periatrial white matter, consistent with acute small vessel ischemia.":    - Acute Stroke  - Not a stroke  - Unable to clinically determine    Thank You, [facility]

## 2016-01-27 LAB — BASIC METABOLIC PANEL
Anion gap: 10 (ref 5–15)
BUN: 15 mg/dL (ref 6–20)
CHLORIDE: 97 mmol/L — AB (ref 101–111)
CO2: 23 mmol/L (ref 22–32)
CREATININE: 1.05 mg/dL (ref 0.61–1.24)
Calcium: 8.9 mg/dL (ref 8.9–10.3)
GFR calc Af Amer: >60 mL/min (ref >60)
GFR calc non Af Amer: >60 mL/min (ref >60)
GLUCOSE: 105 mg/dL — AB (ref 65–99)
Potassium: 3.9 mmol/L (ref 3.5–5.1)
Sodium: 130 mmol/L — ABNORMAL LOW (ref 135–145)

## 2016-01-27 LAB — CBC
HEMATOCRIT: 39 % (ref 39.0–52.0)
HEMOGLOBIN: 13.2 g/dL (ref 13.0–17.0)
MCH: 29.8 pg (ref 26.0–34.0)
MCHC: 33.8 g/dL (ref 30.0–36.0)
MCV: 88 fL (ref 78.0–100.0)
Platelets: 154 10*3/uL (ref 150–400)
RBC: 4.43 MIL/uL (ref 4.22–5.81)
RDW: 13.1 % (ref 11.5–15.5)
WBC: 9.8 10*3/uL (ref 4.0–10.5)

## 2016-01-27 NOTE — Evaluation (Signed)
Occupational Therapy Evaluation Patient Details Name: Casey Lee MRN: [id number] DOB: [birth year] Today's Date: 01/27/2016    History of Present Illness 82 y.o. male s/p fall with CT (+) two small hemorrhagic spots ( ICH and SDH). New onset of HTN PMH: none to report   Clinical Impression   PT admitted with s/p fall without no memory of event. Pt currently with functional limitiations due to the deficits listed below (see OT problem list). PTA independent in all adls, driving and working full time in Scientist, research (life sciences) estate. Pt advised due to [facility] score 16 out 30 to speak with MD about return to work and advised on home task that could need wife to (A) patient.  Pt will benefit from skilled OT to increase their independence and safety with adls and balance to allow discharge Icard. Pt reports dizziness > 1 minute during session and unable to describe dizziness.  Orthostatic BPs  Supine 150/82  Sitting 181/76     Standing 151/89           Follow Up Recommendations  Home health OT    Equipment Recommendations  None recommended by OT    Recommendations for Other Services       Precautions / Restrictions Precautions Precautions: Fall Precaution Comments: watch BP      Mobility Bed Mobility Overal bed mobility: Independent                Transfers Overall transfer level: Needs assistance   Transfers: Sit to/from Stand Sit to Stand: Min assist         General transfer comment: posterior lean initially and reaching for bed rails    Balance Overall balance assessment: Needs assistance Sitting-balance support: Bilateral upper extremity supported;Feet supported Sitting balance-Leahy Scale: Fair Sitting balance - Comments: pt very restless at eob. Wife and patient both report - baseline is very restless in natural and talks with use of bil Hands   Standing balance support: No upper extremity supported;During functional activity Standing balance-Leahy Scale: Poor                               ADL Overall ADL's : Needs assistance/impaired     Grooming: Wash/dry face;Wash/dry hands;Sitting;Modified independent Grooming Details (indicate cue type and reason): pt however requires min to mod (A) with static standing due to balance deficits.                                General ADL Comments: Pt limited greatly by dizziness for adls. Pt able to physically complete task but unsafe to do so without (A) due to balance. see vitals obtained in session     Vision Additional Comments: Pt able to scan paper in a paper consistent with reading and correctly locate dots in all quadrants. pt able to read [facility] and complete task without cues.    Perception     Praxis      Pertinent Vitals/Pain Pain Assessment: No/denies pain     Hand Dominance Right   Extremity/Trunk Assessment Upper Extremity Assessment Upper Extremity Assessment: Overall WFL for tasks assessed       Cervical / Trunk Assessment Cervical / Trunk Assessment: Normal   Communication Communication Communication: No difficulties   Cognition Arousal/Alertness: Awake/alert Behavior During Therapy: Flat affect Overall Cognitive Status: Impaired/Different from baseline Area of Impairment: Attention;Memory  General Comments: Pt completed the [place] with deficits in visuospatial / executive function, naming animals , attention to task and memory. pt with no delayed recall per test. 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 pt reports "i dont think that will be a problem." pt takes a phone call during session and reports "i have a little set back that has kept me out of the office for two days but I will be back to work tonight or tomorrow to get that contract to you." Wife reports to patient "it has to be tomorrow  they told you that you would be here tonight." pt shakes head unable to recall information. Pt reports having a Primary MD appointment in May and going to that to have his medications adjusted. Wife again reports to patient  "they told you that you have an appointment here in 3 weeks. " pt states "no no I dont think so." Pt and wife educated and advised to write down all information from staff on paper/ pen provided and to keep a calendar to share of all future follow up appointments.    General Comments       Exercises       Shoulder Instructions      Home Living Family/patient expects to be discharged to:: Private residence Living Arrangements: Spouse/significant other Available Help at Discharge: Family;Available 24 hours/day Type of Home: House Home Access: Stairs to enter [employer] of Steps: 4 Entrance Stairs-Rails: None Home Layout: Two level;Full bath on main level;Able to live on main level with bedroom/bathroom Alternate Level Stairs-Number of Steps: 11 Alternate Level Stairs-Rails: Left Bathroom Shower/Tub: Occupational psychologist: Standard Bathroom Accessibility: Yes   Home Equipment: Environmental consultant - 2 wheels;Kasandra Knudsen - single point   Additional Comments: 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 pt reports hours are "until its done and as much as it takes" pt reports "my boss since 1955 says I have to travel so I just only do the ones the bank sends me now so that I can start to do more traveling"  (pt means wife wants him to retire)      Prior Functioning/Environment Level of Independence: Independent        Comments: drives    OT Diagnosis: Generalized weakness;Cognitive deficits   OT Problem List: Decreased strength;Decreased activity tolerance;Impaired balance (sitting and/or standing);Decreased cognition;Decreased safety awareness;Decreased knowledge of use of DME or AE;Decreased knowledge of precautions;Cardiopulmonary status  limiting activity   OT Treatment/Interventions: Self-care/ADL training;Therapeutic exercise;DME and/or AE instruction;Therapeutic activities;Cognitive remediation/compensation;Patient/family education;Balance training    OT Goals(Current goals can be found in the care plan section) Acute Rehab OT Goals Patient Stated Goal: to return to work tomorrow OT Goal Formulation: With patient/family Time For Goal Achievement: 02/10/16 Potential to Achieve Goals: Good  OT Frequency: Min 2X/week   Barriers to D/C: Decreased caregiver support  wife walks with a cane       Co-evaluation              End of Session Nurse Communication: Mobility status;Precautions  Activity Tolerance: Patient tolerated treatment well Patient left: in bed;with call bell/phone within reach;with family/visitor present   Time: [id number] OT Time Calculation (min): 28 min Charges:  OT General Charges $OT Visit: 1 Procedure OT Treatments $Cognitive Skills Development: 8-22 mins G-Codes:    Parke Poisson B 02-24-2016,  12:15 PM   Jeri Modena   OTR/L Pager: [phone] Office: [phone] .

## 2016-01-27 NOTE — Progress Notes (Signed)
STROKE TEAM PROGRESS NOTE   SUBJECTIVE (INTERVAL HISTORY) Wife at the bedside. Reports has received PT this am. PT will come back to teach him more. He is feeling some better. PO intake still poor. Patient anxious for discharge.   OBJECTIVE Temp:  [97.5 F (36.4 C)-97.6 F (36.4 C)] 97.5 F (36.4 C) (04/26 0400) Pulse Rate:  [57-77] 65 (04/26 0730) Cardiac Rhythm:  [-] Normal sinus rhythm (04/26 0700) Resp:  [11-25] 17 (04/26 0730) BP: (140-184)/(68-105) 154/99 mmHg (04/26 0730) SpO2:  [95 %-99 %] 98 % (04/26 0730)  CBC:   Recent Labs Lab 01/24/16 2235  01/26/16 0338 01/27/16 0442  WBC 9.2  < > 8.1 9.8  NEUTROABS 6.5  --   --   --   HGB 13.9  < > 12.8* 13.2  HCT 41.5  < > 37.9* 39.0  MCV 89.8  < > 89.6 88.0  PLT 159  < > 153 154  < > = values in this interval not displayed.  Basic Metabolic Panel:   Recent Labs Lab 01/26/16 0338 01/27/16 0442  NA 130* 130*  K 4.1 3.9  CL 99* 97*  CO2 21* 23  GLUCOSE 119* 105*  BUN 19 15  CREATININE 1.30* 1.05  CALCIUM 8.8* 8.9    Lipid Panel:     Component Value Date/Time   CHOL 203* 01/25/2016 0710   TRIG 54 01/25/2016 0710   HDL 50 01/25/2016 0710   CHOLHDL 4.1 01/25/2016 0710   VLDL 11 01/25/2016 0710   LDLCALC 142* 01/25/2016 0710   HgbA1c:  Lab Results  Component Value Date   HGBA1C 6.3* 01/25/2016    IMAGING I have personally reviewed the radiological images below and agree with the radiology interpretations.  Dg Chest 2 View 01/25/2016  No acute finding.   Ct Head Wo Contrast 01/26/2016 1. Slight interval decrease in conspicuity of 5 mm subependymal hemorrhage centered at the left caudate body. 2. Interval decrease in small volume acute left parafalcine subdural hematoma. No mass effect. 3. No new intracranial process. 4. Evolving left posterior scalp contusion.  01/25/2016   1. Tiny interhemispheric subdural hematoma again noted on the left side, near the convexity, relatively stable in appearance. 2.  Apparent small 5 mm subependymal hemorrhage again noted at the left caudate body. 3. Mild cortical volume loss and scattered small vessel ischemic microangiopathy. 4. Soft tissue swelling overlying the left occiput.  01/24/2016   1. Tiny interhemispheric subdural hematoma without mass effect. 2. 8 mm subependymal hemorrhage at the left caudate body. 3. Posterior scalp hematoma without calvarial fracture.   Ct Cervical Spine Wo Contrast 01/24/2016   Negative for cervical spine fracture.   Mr Brain Ltd W/o Cm 01/25/2016   1. Grossly stable subcentimeter subependymal hemorrhage centered at the left caudate body as well as tiny left parafalcine subdural hematoma. These are better evaluated prior CT from earlier the same day. 2. Tiny 4 mm foci of restricted diffusion within the right periatrial white matter, consistent with acute small vessel ischemia. 3. Left parieto-occipital scalp contusion. 4. Two tiny remote left cerebellar infarcts. 5. Age-related cerebral atrophy with mild chronic small vessel ischemic disease.   Carotid Doppler   There is 1-39% bilateral ICA stenosis. Vertebral artery flow is antegrade.    TTE  - Left ventricle: The cavity size was normal. Wall thickness was normal. Systolic function was normal. The estimated ejection fraction was in the range of 55% to 60%. Wall motion was normal; there were no regional wall motion  abnormalities. Doppler parameters are consistent with abnormal left ventricular relaxation (grade 1 diastolic dysfunction). Impressions:   Normal LV systolic function; grade 1 diastolic dysfunction; trace MR and TR.  EEG - This is a normal drowsy and asleep EEG for the patients stated age. There were no focal, hemispheric or lateralizing features. No epileptiform activity was recorded. A normal EEG does not exclude the diagnosis of a seizure disorder   L shoulder xray 01/25/2016 negative   PHYSICAL EXAM General - Well nourished, well developed, no acute  distress.  Ophthalmologic - Fundi not visualized due to noncooperation.  Cardiovascular - Regular rate and rhythm.  Mental Status -  Level of arousal and orientation to time, place, and person were intact. Language including expression, naming, repetition, comprehension was assessed and found intact. Fund of Knowledge was assessed and was intact.  Cranial Nerves II - XII - II - Visual field intact OU. III, IV, VI - Extraocular movements intact. V - Facial sensation intact bilaterally. VII - Facial movement intact bilaterally. VIII - Hearing & vestibular intact bilaterally. X - Palate elevates symmetrically. XI - Chin turning & shoulder shrug intact bilaterally. XII - Tongue protrusion intact.  Motor Strength - The patient's strength was normal in all extremities and pronator drift was absent.  Bulk was normal and fasciculations were absent.   Motor Tone - Muscle tone was assessed at the neck and appendages and was normal.  Reflexes - The patient's reflexes were 1+ in all extremities and he had no pathological reflexes.  Sensory - Light touch, temperature/pinprick were assessed and were symmetrical.    Coordination - The patient had normal movements in the hands and feet with no ataxia or dysmetria.  Tremor was absent.  Gait and Station - deferred to PT.  4/25 - Dix-hallpike testing on the right side (left side not tested) - positive for vertigo, N/V, pt anxious and eyes closed and nystagmus not able to be seen.   ASSESSMENT/PLAN Mr. Casey Lee is a 82 y.o. male with history of hypertension who fell and ? passed out ? loss of consciousness at home with resultant confusion. CT of the head showed ICH and SDH.   BPPV exacerbation   known hx  Resultant nausea and vomiting and vertigo  Vestibular PT and maneuver training  Needs to do exercises at home 4-5/day and continue for one month   Scheduled meclizine and PRN zofran  Traumatic L parafalcine SDH  Tiny L caudate  body hemorrhage, etiology unclear, ? Traumatic vs. ? Hypertension  Resultant  Mild confusion  CT tiny interhemispheric SDH and small left caudate body subependymal ICH  MRI  Stable subependymal left caudate body ICH and tiny left parafalcine SDH  Repeat CT head 4/25 stable with hmgs resolving. No new abnormality  EEG normal   SCDs for VTE prophylaxis Diet Heart Room service appropriate?: Yes; Fluid consistency:: Thin  aspirin 81 mg daily prior to admission, no antithrombotics due to [place]. Anticipate resumption in 1 month or so  Repeat CT head in future to assure hemorrhage resolution  Therapy recommendations:  outpt PT and home OT  Disposition:  pending   Transfer to floor - plan discharge in am  Stroke: incidental finding, acute right peri-ventricular punctate infarct, likely small vessel disease  MRI brain - DWI restriction adjacent to right occipital horn and two old tiny left cerebellar infarcts   Carotid Doppler  No significant stenosis   2D Echo  EF 55-60%  LDL 142  HgbA1c 6.3  aspirin  81 mg daily prior to admission, no antithrombotics due to [place]. Anticipate resumption in 1 month   Hypertension  No hx  Elevated in hospital, improved to 140-150s today  Monitor as an OP  Hyperlipidemia  Home meds:  No statin  LDL 142, goal < 70  Added statin 20mg  daily  continue statin at discharge  Other Stroke Risk Factors  Advanced age  Former Cigarette smoker, quit smoking 63 years ago   Other Active Problems  Hearing loss  Chest pain resolved   Slight elevation in troponins 0.04, 0.05, 0.05  Hx allergic rhinitis   Shoulder pain - X-rays left shoulder negative for fracture. Tylenol or Ultram when necessary.  Hospital day # 3  Rosalin Hawking, MD PhD Stroke Neurology 01/27/2016 6:01 PM  To contact Stroke Continuity provider, please refer to [email or web address] After hours, contact General Neurology

## 2016-01-27 NOTE — Progress Notes (Signed)
   01/27/16 1334  PT Visit Information  Last PT Received On 01/27/16  Assistance Needed +1  History of Present Illness 82 y.o. male s/p fall with CT (+) two small hemorrhagic spots ( ICH and SDH). New onset of HTN PMH: none to report  Subjective Data  Subjective "I am ready for the second round."  Precautions  Precautions Fall  Precaution Comments watch BP  Restrictions  Weight Bearing Restrictions No  Pain Assessment  Pain Assessment No/denies pain  Cognition  Arousal/Alertness Awake/alert  Behavior During Therapy Flat affect  Overall Cognitive Status Impaired/Different from baseline  Area of Impairment Attention;Memory  Current Attention Level Focused  Bed Mobility  Overal bed mobility Independent  Balance  Overall balance assessment Needs assistance  Sitting-balance support No upper extremity supported;Feet supported  Sitting balance-Leahy Scale Fair  General Comments  General comments (skin integrity, edema, etc.) Pt treated via Epley maneuver for right BPPV with symptoms at 8/10 initially and 4/10 at end of treatment.  Pt and wife educated via [employer] exercise and given handout for this exercise.  Teach back of exercise complete.    Exercises  Exercises Other exercises  Other Exercises  Other Exercises [name]  PT - End of Session  Equipment Utilized During Treatment Gait belt  Activity Tolerance Patient limited by fatigue (limited by dizziness)  Patient left in bed;with call bell/phone within reach;with family/visitor present;with nursing/sitter in room  Nurse Communication Mobility status  PT - Assessment/Plan  PT Plan Current plan remains appropriate  PT Frequency (ACUTE ONLY) Min 3X/week  Follow Up Recommendations Outpatient PT;Supervision/Assistance - 24 hour (for vestibular rehab)  PT equipment None recommended by PT  PT Goal Progression  Progress towards PT goals Progressing toward goals  PT Time Calculation  PT Start Time (ACUTE ONLY) 1210  PT  Stop Time (ACUTE ONLY) 1234  PT Time Calculation (min) (ACUTE ONLY) 24 min  PT General Charges  $$ ACUTE PT VISIT 1 Procedure  PT Treatments  $Therapeutic Activity 8-22 mins  $Self Care/Home Management 8-22  [facility] Acute Rehabilitation [phone] (pager)

## 2016-01-27 NOTE — Plan of Care (Signed)
Problem: Coping: Goal: Ability to identify strategies to decrease anxiety will improve Outcome: Completed/Met Date Met:  01/27/16 Pt is able to verbalize needs and have them met. Patient's family members are very interested in learning about care for the patient and eagerly ask questions. Pt has wife and son who will be able to help him with responsibilities and get back and forth to health care visits. Pt has been very positive with outlook on life and current condition.

## 2016-01-27 NOTE — Care Management Important Message (Signed)
Important Message  Patient Details  Name: Casey Lee MRN: [id number] Date of Birth: [birth year]   Medicare Important Message Given:  Yes    Nathen May 01/27/2016, 10:53 AM

## 2016-01-27 NOTE — Progress Notes (Signed)
Patient refuse to take lipitor at this time, states he wishes to speak with his primary MD before he begins new prescription.

## 2016-01-27 NOTE — Progress Notes (Signed)
Physical Therapy Treatment Patient Details Name: Casey Lee MRN: [id number] DOB: 08-15-1928 Today's Date: 01/27/2016    History of Present Illness 82 y.o. male s/p fall with CT (+) two small hemorrhagic spots ( ICH and SDH). New onset of HTN PMH: none to report    PT Comments    Pt admitted with above diagnosis. Pt currently with functional limitations due to balance and endurance deficits as well as dizziness. Pt was able to tolerate Epley maneuver today and treated for left BPPV this am and will return at noon to treat for right BPPV as pt has bil BPPV.  Pt felt better after treatment and was aable to ambulate with RW. Wife and pt  encouraged with pt progress.  Pt will benefit from skilled PT to increase their independence and safety with mobility to allow discharge to the venue listed below.    Follow Up Recommendations  Outpatient PT;Supervision/Assistance - 24 hour (for vestibular rehab)     Equipment Recommendations  None recommended by PT    Recommendations for Other Services       Precautions / Restrictions Precautions Precautions: Fall Precaution Comments: watch BP Restrictions Weight Bearing Restrictions: No    Mobility  Bed Mobility Overal bed mobility: Independent                Transfers Overall transfer level: Needs assistance   Transfers: Sit to/from Stand Sit to Stand: Min guard         General transfer comment: Fairly steady with RW but guard assist. NO LOB in controlled environement.  Ambulation/Gait Ambulation/Gait assistance: Min guard Ambulation Distance (Feet): 30 Feet Assistive device: Rolling walker (2 wheeled) Gait Pattern/deviations: Step-through pattern;Decreased stride length   Gait velocity interpretation: Below normal speed for age/gender General Gait Details: Pt was able to ambulate withRW to bathroom.  Cleaned himself without assist.  Pt without significant LOB.   Stairs            Wheelchair Mobility     Modified Rankin (Stroke Patients Only)       Balance Overall balance assessment: Needs assistance Sitting-balance support: No upper extremity supported;Feet supported Sitting balance-Leahy Scale: Fair Sitting balance - Comments: pt very restless at eob. Wife and patient both report - baseline is very restless in natural and talks with use of bil Hands   Standing balance support: No upper extremity supported;During functional activity Standing balance-Leahy Scale: Poor Standing balance comment: requiring RW for balance at present                    Cognition Arousal/Alertness: Awake/alert Behavior During Therapy: Flat affect Overall Cognitive Status: Impaired/Different from baseline Area of Impairment: Attention;Memory   Current Attention Level: Focused           General Comments: Pt completed the [facility] with deficits in visuospatial / executive function, naming animals , attention to task and memory. pt with no delayed recall per test. 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 pt reports "i dont think that will be a problem." pt takes a phone call during session and reports "i have a little set back that has kept me out of the office for two days but I will be back to work tonight or tomorrow to get that contract to you." Wife reports to patient "it has to be tomorrow they told you that you would be  here tonight." pt shakes head unable to recall information. Pt reports having a Primary MD appointment in May and going to that to have his medications adjusted. Wife again reports to patient  "they told you that you have an appointment here in 3 weeks. " pt states "no no I dont think so." Pt and wife educated and advised to write down all information from staff on paper/ pen provided and to keep a calendar to share of all future follow up  appointments.     Exercises      General Comments General comments (skin integrity, edema, etc.): Pt was tested for BPPV for left BPPV and was  positive (PT had suspected bil BPPV).  Treated the pt via Epley maneuver with a small amount of decr dizziness after treatment per pt.  Decided to return at noon to treat right BPPV.         Pertinent Vitals/Pain Pain Assessment: No/denies pain  97% on RA, 188/85 with nurse aware and brought BP meds.  165/76 at end of rx.    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 pt reports hours are "until its done and as much as it takes" pt reports "my boss since 1955 says I have to travel so I just only do the ones the bank sends me now so that I can start to do more traveling"  (pt means wife wants him to retire)    Prior Function Level of Independence: Independent      Comments: drives   PT Goals (current goals can now be found in the care plan section) Acute Rehab PT Goals Patient Stated Goal: to return to work tomorrow Progress towards PT goals: Progressing toward goals    Frequency  Min 3X/week    PT Plan Current plan remains appropriate    Co-evaluation             End of Session Equipment Utilized During Treatment: Gait belt Activity Tolerance: Patient limited by fatigue (limited by dizziness) Patient left: in bed;with call bell/phone within reach;with family/visitor present;with nursing/sitter in room     Time: [id number] PT Time Calculation (min) (ACUTE ONLY): 31 min  Charges:  $Gait  Training: 8-22 mins $[place] Rep Proc: 8-22 mins                    G Codes:      Irwin Brakeman F 02/17/2016, 1:30 PM  [employer] Acute Rehabilitation [phone] (pager)

## 2016-01-28 DIAGNOSIS — S065X9A Traumatic subdural hemorrhage with loss of consciousness of unspecified duration, initial encounter: Secondary | ICD-10-CM | POA: Diagnosis present

## 2016-01-28 DIAGNOSIS — I10 Essential (primary) hypertension: Secondary | ICD-10-CM | POA: Diagnosis present

## 2016-01-28 DIAGNOSIS — S065XAA Traumatic subdural hemorrhage with loss of consciousness status unknown, initial encounter: Secondary | ICD-10-CM | POA: Diagnosis present

## 2016-01-28 DIAGNOSIS — H8110 Benign paroxysmal vertigo, unspecified ear: Secondary | ICD-10-CM | POA: Diagnosis present

## 2016-01-28 DIAGNOSIS — I6200 Nontraumatic subdural hemorrhage, unspecified: Secondary | ICD-10-CM

## 2016-01-28 DIAGNOSIS — E785 Hyperlipidemia, unspecified: Secondary | ICD-10-CM | POA: Diagnosis present

## 2016-01-28 DIAGNOSIS — I639 Cerebral infarction, unspecified: Secondary | ICD-10-CM | POA: Diagnosis present

## 2016-01-28 LAB — BASIC METABOLIC PANEL
Anion gap: 11 (ref 5–15)
BUN: 17 mg/dL (ref 6–20)
CALCIUM: 9.2 mg/dL (ref 8.9–10.3)
CHLORIDE: 102 mmol/L (ref 101–111)
CO2: 22 mmol/L (ref 22–32)
CREATININE: 1.18 mg/dL (ref 0.61–1.24)
GFR calc non Af Amer: 54 mL/min — ABNORMAL LOW (ref >60)
GLUCOSE: 92 mg/dL (ref 65–99)
Potassium: 3.9 mmol/L (ref 3.5–5.1)
Sodium: 135 mmol/L (ref 135–145)

## 2016-01-28 LAB — CBC
HEMATOCRIT: 40.7 % (ref 39.0–52.0)
HEMOGLOBIN: 13.6 g/dL (ref 13.0–17.0)
MCH: 29.3 pg (ref 26.0–34.0)
MCHC: 33.4 g/dL (ref 30.0–36.0)
MCV: 87.7 fL (ref 78.0–100.0)
Platelets: 160 10*3/uL (ref 150–400)
RBC: 4.64 MIL/uL (ref 4.22–5.81)
RDW: 13.2 % (ref 11.5–15.5)
WBC: 9.6 10*3/uL (ref 4.0–10.5)

## 2016-01-28 MED ORDER — MECLIZINE HCL 12.5 MG PO TABS: 12.5000 mg | ORAL_TABLET | Freq: Three times a day (TID) | ORAL | Status: DC

## 2016-01-28 MED ORDER — ACETAMINOPHEN 325 MG PO TABS: 650.0000 mg | ORAL_TABLET | ORAL | Status: DC | PRN

## 2016-01-28 MED ORDER — HALOPERIDOL LACTATE 5 MG/ML IJ SOLN
2.0000 mg | Freq: Once | INTRAMUSCULAR | Status: AC
  Administered 2016-01-28: 2 mg via INTRAVENOUS
  Filled 2016-01-28: qty 1

## 2016-01-28 MED ORDER — QUETIAPINE FUMARATE 25 MG PO TABS
25.0000 mg | ORAL_TABLET | Freq: Four times a day (QID) | ORAL | Status: DC
  Filled 2016-01-28: qty 1

## 2016-01-28 NOTE — Discharge Summary (Signed)
Stroke Discharge Summary  Patient ID: Casey Lee   MRN: [id number]      DOB: 11-Oct-1927  Date of Admission: 01/24/2016 Date of Discharge: 01/28/2016  Attending Physician:  Rosalin Hawking, MD, Stroke MD Patient's PCP:  Dwan Bolt, MD  DISCHARGE DIAGNOSIS:  Principal Problem:   BPPV (benign paroxysmal positional vertigo) Active Problems:   Traumatic hemorrhage of cerebrum with loss of consciousness ([place]) - tiny L caudate   Acute encephalopathy   SDH (subdural hematoma) (HCC)   Cerebral infarction ([place]) - incidental, punctate R periventricular infarct   Essential hypertension   Hyperlipidemia LDL goal <70 Chest pain resolved  Elevated troponins    Shoulder pain      Medication List    TAKE these medications        diphenhydrAMINE 25 mg capsule  Commonly known as:  BENADRYL  Take 25 mg by mouth every 6 (six) hours as needed for allergies.     loratadine 10 MG tablet  Commonly known as:  CLARITIN  Take 10 mg by mouth daily as needed for allergies.     tamsulosin 0.4 MG Caps capsule  Commonly known as:  FLOMAX  Take 0.4 mg by mouth every other day.      ASK your doctor about these medications        aspirin 81 MG tablet  Take 81 mg by mouth daily.        LABORATORY STUDIES CBC    Component Value Date/Time   WBC 9.6 01/28/2016 0235   RBC 4.64 01/28/2016 0235   HGB 13.6 01/28/2016 0235   HCT 40.7 01/28/2016 0235   PLT 160 01/28/2016 0235   MCV 87.7 01/28/2016 0235   MCH 29.3 01/28/2016 0235   MCHC 33.4 01/28/2016 0235   RDW 13.2 01/28/2016 0235   LYMPHSABS 1.7 01/24/2016 2235   MONOABS 0.7 01/24/2016 2235   EOSABS 0.3 01/24/2016 2235   BASOSABS 0.0 01/24/2016 2235   CMP    Component Value Date/Time   NA 135 01/28/2016 0235   K 3.9 01/28/2016 0235   CL 102 01/28/2016 0235   CO2 22 01/28/2016 0235   GLUCOSE 92 01/28/2016 0235   BUN 17 01/28/2016 0235   CREATININE 1.18 01/28/2016 0235   CALCIUM 9.2 01/28/2016 0235   PROT 7.1  01/24/2016 2235   ALBUMIN 3.9 01/24/2016 2235   AST 22 01/24/2016 2235   ALT 16* 01/24/2016 2235   ALKPHOS 60 01/24/2016 2235   BILITOT 0.6 01/24/2016 2235   GFRNONAA 54* 01/28/2016 0235   GFRAA >60 01/28/2016 0235   COAGS Lab Results  Component Value Date   INR 1.06 01/24/2016   Lipid Panel    Component Value Date/Time   CHOL 203* 01/25/2016 0710   TRIG 54 01/25/2016 0710   HDL 50 01/25/2016 0710   CHOLHDL 4.1 01/25/2016 0710   VLDL 11 01/25/2016 0710   LDLCALC 142* 01/25/2016 0710   HgbA1C  Lab Results  Component Value Date   HGBA1C 6.3* 01/25/2016   Cardiac Panel (last 3 results)   Recent Labs  01/25/16 1303 01/25/16 1817  TROPONINI 0.05* 0.05*   Urinalysis    Component Value Date/Time   COLORURINE YELLOW 01/24/2016 2337   APPEARANCEUR CLEAR 01/24/2016 2337   LABSPEC 1.010 01/24/2016 2337   PHURINE 7.5 01/24/2016 [place] 01/24/2016 [place] 01/24/2016 [place] 01/24/2016 [place] 01/24/2016 [place] NEGATIVE 01/24/2016 2337  NITRITE NEGATIVE 01/24/2016 2337   LEUKOCYTESUR NEGATIVE 01/24/2016 2337     SIGNIFICANT DIAGNOSTIC STUDIES  Ct Head Wo Contrast 01/26/2016 1. Slight interval decrease in conspicuity of 5 mm subependymal hemorrhage centered at the left caudate body. 2. Interval decrease in small volume acute left parafalcine subdural hematoma. No mass effect. 3. No new intracranial process. 4. Evolving left posterior scalp contusion.  01/25/2016 1. Tiny interhemispheric subdural hematoma again noted on the left side, near the convexity, relatively stable in appearance. 2. Apparent small 5 mm subependymal hemorrhage again noted at the left caudate body. 3. Mild cortical volume loss and scattered small vessel ischemic microangiopathy. 4. Soft tissue swelling overlying the left occiput.  01/24/2016 1. Tiny interhemispheric subdural hematoma without mass effect. 2. 8 mm  subependymal hemorrhage at the left caudate body. 3. Posterior scalp hematoma without calvarial fracture.   Ct Cervical Spine Wo Contrast 01/24/2016 Negative for cervical spine fracture.   Casey Lee Brain Ltd W/o Cm 01/25/2016 1. Grossly stable subcentimeter subependymal hemorrhage centered at the left caudate body as well as tiny left parafalcine subdural hematoma. These are better evaluated prior CT from earlier the same day. 2. Tiny 4 mm foci of restricted diffusion within the right periatrial white matter, consistent with acute small vessel ischemia. 3. Left parieto-occipital scalp contusion. 4. Two tiny remote left cerebellar infarcts. 5. Age-related cerebral atrophy with mild chronic small vessel ischemic disease.   Carotid Doppler  There is 1-39% bilateral ICA stenosis. Vertebral artery flow is antegrade.   TTE  - Left ventricle: The cavity size was normal. Wall thickness was normal. Systolic function was normal. The estimated ejection fraction was in the range of 55% to 60%. Wall motion was normal; there were no regional wall motion abnormalities. Doppler parameters are consistent with abnormal left ventricular relaxation (grade 1 diastolic dysfunction). Impressions: Normal LV systolic function; grade 1 diastolic dysfunction; trace Casey Lee and TR.  EEG - This is a normal drowsy and asleep EEG for the patients stated age. There were no focal, hemispheric or lateralizing features. No epileptiform activity was recorded. A normal EEG does not exclude the diagnosis of a seizure disorder   L shoulder xray 01/25/2016 negative    Dg Chest 2 View 01/25/2016 No acute finding.    HISTORY OF PRESENT ILLNESS Casey Lee is a 82 y.o. male with hx of HTN who was at home in his [place] when he got up from bed and felt lightheaded and apparently felt lightheaded on the way to the bathroom. He denies LOC but he does not remember falling or even waking up on the floor - essentially denies LOC but at  this time has definite anterograde and retrograde amnesia around the time of the fall. Denies dyaphoresis, blurred vision before the fall. He was brought in as a code stroke because of confusion. In the CT scanner it became obvious that the patient had no neurological symptoms and thus I cancelled the code stroke. I am admitting the pt instead as the CT showed two small hemorrhagic spots, one ICH and one SDH. Denied any reason for being dehydrated. Denied ETOH. Denied hx of sz. ICH Score: 1. Patient was not administered IV t-PA secondary to hemorrhage. He was admitted for further evaluation and treatment.   HOSPITAL COURSE Casey Lee. Casey Lee is a 82 y.o. male with history of hypertension who fell and ? passed out ? loss of consciousness at home with resultant confusion. CT of the head showed ICH and SDH.   BPPV  exacerbation   known hx  Resultant nausea and vomiting and vertigo  Vestibular PT and maneuver training  Needs to do exercises at home 4-5/day and continue for one month   meclizine PRN, patient instructed not to take regularly  Has OTC antiemetic at home he may use  Fall likely secondary to BPPV  Traumatic L parafalcine SDH  Tiny L caudate body hemorrhage, etiology unclear, ? Traumatic vs. ? Hypertension  Resultant Mild confusion  CT tiny interhemispheric SDH and small left caudate body subependymal ICH  MRI Stable subependymal left caudate body ICH and tiny left parafalcine SDH  Repeat CT head 4/25 stable with hmgs resolving. No new abnormality  EEG normal   aspirin 81 mg daily prior to admission, no antithrombotics due to [place]. 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 Anticipate resumption in 1 month  Hypertension  No hx  Elevated in hospital, improved to 120-140s day of discharge  Monitor as an OP. Patient instructed to take and keep a BP diary for him PCP.  Hyperlipidemia  Home meds: No statin  LDL 142, goal < 70  Added lipitor 20mg  daily which patient refused  Has tried statin in the past, prescribed by PCP - felt it was effecting him memory - stopped after 6 months  He is okay to try a smaller dose of pravachol 80 given to him by PCP  Other Stroke Risk Factors  Advanced age  Former Cigarette smoker, quit smoking 63 years ago  Other Active Problems  Hearing loss  Chest pain resolved   Slight elevation in troponins 0.04, 0.05, 0.05  Hx allergic rhinitis   Shoulder pain - X-rays left shoulder negative for fracture. Tylenol or Ultram when necessary.   DISCHARGE EXAM Blood pressure 146/82, pulse 72, temperature 97.4 F (36.3 C), temperature source Oral, resp. rate 18, height 5\' 9"  (1.753 m), weight 64.3 kg (141 lb 12.1 oz), SpO2 95 %. General - Well nourished, well developed, no acute distress.  Ophthalmologic - Fundi not visualized due to noncooperation.  Cardiovascular - Regular rate and rhythm.  Mental Status -  Level of arousal and orientation to time, place, and person were intact. Language including expression, naming, repetition, comprehension was assessed and found intact. Fund of Knowledge was assessed and was intact.  Cranial Nerves II - XII - II - Visual field intact OU. III, IV, VI - Extraocular movements intact. V - Facial sensation intact  bilaterally. VII - Facial movement intact bilaterally. VIII - Hearing & vestibular intact bilaterally. X - Palate elevates symmetrically. XI - Chin turning & shoulder shrug intact bilaterally. XII - Tongue protrusion intact.  Motor Strength - The patient's strength was normal in all extremities and pronator drift was absent. Bulk was normal and fasciculations were absent.  Motor Tone - Muscle tone was assessed at the neck and appendages and  was normal.  Reflexes - The patient's reflexes were 1+ in all extremities and he had no pathological reflexes.  Sensory - Light touch, temperature/pinprick were assessed and were symmetrical.   Coordination - The patient had normal movements in the hands and feet with no ataxia or dysmetria. Tremor was absent.  Gait and Station - deferred to PT.  4/25 - Dix-hallpike testing on the right side (left side not tested) - positive for vertigo, N/V, pt anxious and eyes closed and nystagmus not able to be seen.    Discharge Diet   Diet Heart Room service appropriate?: Yes; Fluid consistency:: Thin liquids  DISCHARGE PLAN  Disposition:  Home  Outpatient vestibular PT and OT   No antithrombotic for now, consider resumption of aspirin 81 mg daily after CT scan in 1 month, depending on hemorrhage resolution  Patient advised not to drive x 1 week or while dizzy  Instructed to  Walk with walker  Continue Vestibular exercises 4-5x/day  Follow-up Dwan Bolt, MD in 2 weeks.  Follow-up with Dr. Rosalin Hawking, Stroke Clinic in 2 months, office to schedule an appointment.  40 minutes were spent preparing discharge.  [place] for Pager information 01/28/2016 9:49 AM   I, the attending vascular neurologist, have personally obtained a history, examined the patient, evaluated laboratory data, individually viewed imaging studies and agree with radiology interpretations. I also discussed with wife regarding  his care plan. Together with the NP/PA, we formulated the assessment and plan of care which reflects our mutual decision.  I have made any additions or clarifications directly to the above note and agree with the findings and plan as currently documented.    Rosalin Hawking, MD PhD Stroke Neurology 01/29/2016 12:57 AM

## 2016-01-28 NOTE — Progress Notes (Signed)
RN went over discharge paper work with patient and wife. Both verbalized understanding of discharge instructions. All questions answered. Patient taken by wheelchair to vehicle. [name]

## 2016-01-28 NOTE — Progress Notes (Signed)
Patient adamant about being discharged. MD Erlinda Hong notified MD called and spoke with patient on the telephone. Patient agreed to stay until MD could come to see patient for discharge. Will continue to monitor patient. [name]

## 2016-01-28 NOTE — Progress Notes (Addendum)
Physical Therapy Treatment Patient Details Name: Casey Lee MRN: [id number] DOB: 1928/06/04 Today's Date: 01/28/2016    History of Present Illness 82 y.o. male s/p fall with CT (+) two small hemorrhagic spots ( ICH and SDH). New onset of HTN PMH: none to report    PT Comments    Pt admitted with above diagnosis. Pt currently with functional limitations due to balance and endurance deficits. Pt was able to ambulate in hall with RW with good safety as long as he uses RW.  Wife and pt made aware that RW is needed for safety and that wife should guard pt initially with walking and up and down stairs.  Feel that pt and wife have decr awareness of pts deficits but have provided with education at length.  Balance on DGI 11/24 suggesting risk for falls without assistive device.  HAve RW at home.  Vertigo much better but pt with residual balance issues that Outpt PT can address.  Dr. Erlinda Hong came in and was discussing same issues with pt and wife on PT departure especially regarding driving - Dr. Erlinda Hong  Reiterated to pt that he must wait to drive until dizziness is gone and that he may have bad and good days persisting.   Pt will benefit from skilled PT to increase their independence and safety with mobility to allow discharge to the venue listed below.    Follow Up Recommendations  Outpatient PT;Supervision/Assistance - 24 hour (for vestibular rehab)     Equipment Recommendations  None recommended by PT    Recommendations for Other Services       Precautions / Restrictions Precautions Precautions: Fall Restrictions Weight Bearing Restrictions: No    Mobility  Bed Mobility Overal bed mobility: Independent                Transfers Overall transfer level: Needs assistance   Transfers: Sit to/from Stand Sit to Stand: Min guard         General transfer comment: Fairly steady with RW but guard assist without RW.  Also instructed wife to guard pt intially.     Ambulation/Gait Ambulation/Gait assistance: Min guard;Min assist Ambulation Distance (Feet): 250 Feet Assistive device: Rolling walker (2 wheeled) Gait Pattern/deviations: Step-through pattern;Decreased stride length;Staggering left;Staggering right;Scissoring   Gait velocity interpretation: Below normal speed for age/gender General Gait Details: NO LOB in controlled environment and with RW. However, if pt given challenges to balance, does not do as well and needs min assist for balance. Pt has difficulty with high level balance activities. Spent time discussing safety with pt and wife regarding balance and use of RW at all times at least for first few weeeks.    Stairs Stairs: Yes Stairs assistance: Min assist Stair Management: Step to pattern;Forwards;One rail Left Number of Stairs: 5 General stair comments: Pt able to ascend and descend steps with min assist and cues.  Had difficulty needing cues for safety and assist.    Wheelchair Mobility    Modified Rankin (Stroke Patients Only)       Balance Overall balance assessment: Needs assistance;History of Falls Sitting-balance support: No upper extremity supported;Feet supported Sitting balance-Leahy Scale: Fair     Standing balance support: No upper extremity supported;During functional activity Standing balance-Leahy Scale: Poor Standing balance comment: requiring use of RW for support.                    Cognition Arousal/Alertness: Awake/alert Behavior During Therapy: Flat affect Overall Cognitive Status: Impaired/Different from baseline Area  of Impairment: Attention;Memory   Current Attention Level: Focused           General Comments: Pt with decr safety awareness with poor awareness of deficits.  Pt also got confused as to where his room was and stated"this is not my office" when he got back to room.  Pt continues to have memory deficits and spent time making pt and wife aware that these issues may  persist and that pt needs to rest and give himself time to heal and recover.      Exercises Other Exercises Other Exercises: Reviewed [name] 5 x each direction with dizziness <2/10 per pt report.      General Comments General comments (skin integrity, edema, etc.): Pt  with negative Modified Hallpike today as pt with slight dizziness left side.  Offered to perform epley once more but pt prefers to perform [name] until rest of symptoms subside.  Pt scored 11/24 on DGI.  Instructed pt and wife to ensure safety, pt needs to use RW at all times and have guard assist on stairs and with ambulation initially.  Wife agrees to guard pt.        Pertinent Vitals/Pain Pain Assessment: No/denies pain  VSS    Home Living                      Prior Function            PT Goals (current goals can now be found in the care plan section) Progress towards PT goals: Progressing toward goals    Frequency  Min 3X/week    PT Plan Current plan remains appropriate    Co-evaluation             End of Session Equipment Utilized During Treatment: Gait belt Activity Tolerance: Patient limited by fatigue Patient left: in bed;with call bell/phone within reach;with family/visitor present;with nursing/sitter in room     Time: 0917-0953 PT Time Calculation (min) (ACUTE ONLY): 36 min  Charges:  $Gait Training: 8-22 mins $Self Care/Home Management: 8-22                    G CodesIrwin Brakeman F 2016/02/04, 10:40 AM Yumi Insalaco,PT Acute Rehabilitation [phone] (pager)

## 2016-01-29 ENCOUNTER — Other Ambulatory Visit: Payer: Self-pay | Admitting: Neurology

## 2016-01-29 DIAGNOSIS — S065X9A Traumatic subdural hemorrhage with loss of consciousness of unspecified duration, initial encounter: Secondary | ICD-10-CM

## 2016-01-29 DIAGNOSIS — S065XAA Traumatic subdural hemorrhage with loss of consciousness status unknown, initial encounter: Secondary | ICD-10-CM

## 2016-02-04 DIAGNOSIS — Z125 Encounter for screening for malignant neoplasm of prostate: Secondary | ICD-10-CM | POA: Diagnosis not present

## 2016-02-04 DIAGNOSIS — Z79899 Other long term (current) drug therapy: Secondary | ICD-10-CM | POA: Diagnosis not present

## 2016-02-04 DIAGNOSIS — E789 Disorder of lipoprotein metabolism, unspecified: Secondary | ICD-10-CM | POA: Diagnosis not present

## 2016-02-04 DIAGNOSIS — R32 Unspecified urinary incontinence: Secondary | ICD-10-CM | POA: Diagnosis not present

## 2016-02-04 DIAGNOSIS — Z0000 Encounter for general adult medical examination without abnormal findings: Secondary | ICD-10-CM | POA: Diagnosis not present

## 2016-02-05 ENCOUNTER — Encounter: Payer: Self-pay | Admitting: Physical Therapy

## 2016-02-05 ENCOUNTER — Ambulatory Visit: Payer: Medicare Other | Attending: Endocrinology | Admitting: Physical Therapy

## 2016-02-05 DIAGNOSIS — R42 Dizziness and giddiness: Secondary | ICD-10-CM | POA: Diagnosis not present

## 2016-02-05 DIAGNOSIS — H8111 Benign paroxysmal vertigo, right ear: Secondary | ICD-10-CM | POA: Diagnosis not present

## 2016-02-05 DIAGNOSIS — H8112 Benign paroxysmal vertigo, left ear: Secondary | ICD-10-CM | POA: Diagnosis not present

## 2016-02-05 DIAGNOSIS — R2681 Unsteadiness on feet: Secondary | ICD-10-CM | POA: Diagnosis not present

## 2016-02-05 DIAGNOSIS — R2689 Other abnormalities of gait and mobility: Secondary | ICD-10-CM

## 2016-02-05 NOTE — Therapy (Addendum)
[facility] [address] [place], [place], [address] Phone: [phone]   Fax:  [phone]  Physical Therapy Evaluation  Patient Details  Name: Casey Lee MRN: [id number] Date of Birth: 11-08-1927 Referring Provider: Rosalin Hawking, MD  Encounter Date: 02/05/2016      PT End of Session - 02/05/16 1311    Visit Number 1   Number of Visits 9  eval + 8 visits   Date for PT Re-Evaluation 04/05/16   Authorization Type Medicare Traditional primary; BCBS secondary   Authorization Time Period G Codes required   PT Start Time [phone]   PT Stop Time 0935   PT Time Calculation (min) 44 min   Activity Tolerance Patient tolerated treatment well   Behavior During Therapy Impulsive  difficult to redirect      Past Medical History  Diagnosis Date  . High cholesterol   . Hearing loss   . UTI (lower urinary tract infection)     Past Surgical History  Procedure Laterality Date  . None      There were no vitals filed for this visit.       Subjective Assessment - 02/05/16 0903    Subjective "It was a Sunday night and I noticed that I this scratch on the back of my head and felt very uncomfortable. I don't know what had happened to me, so I went to the hospital." Per chart, pt arrived to ED confused; CT showed two small hemorrhagic spots, one ICH and one SDH as well as laceration on posterior aspect of head. Pt was treated for BPPV while hospitalized from 4/23 - 4/27. Pt feels strongly that SDH and ICH were incorrectly diagonsed stating, "I want a second opinion". Pt appears to be poor historian and does require frequent redirection thorughout this evaluation.      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  PMH significant for: HTN, HLD, UTI, hearing loss   Diagnostic tests Head CT (4/24): two small hemorrhagic spots, one ICH and one SDH; two tiny remote left cerebellar infarcts; chronic small vessel disease.   Patient  Stated Goals "To get rid of the vertigo."   Currently in Pain? No/denies            [facility] PT Assessment - 02/05/16 0001    Assessment   Medical Diagnosis SDH   Referring Provider Rosalin Hawking, MD   Onset Date/Surgical Date 01/25/16   Precautions   Precautions Fall   Restrictions   Weight Bearing Restrictions No   Balance Screen   Has the patient fallen in the past 6 months Yes   How many times? 1  Pt can recall only 1, but is unsure.   Has the patient had a decrease in activity level because of a fear of falling?  No   Is the patient reluctant to leave their home because of a fear of falling?  No   Prior Function   Level of Independence Independent   Cognition   Overall Cognitive Status Impaired/Different from baseline  unsure of baseline, as no family member present today   Area of Impairment Attention;Memory;Safety/judgement   Current Attention Level Sustained   Memory Decreased short-term memory   Safety/Judgement Decreased awareness of safety   Behaviors Impulsive   Ambulation/Gait   Gait Pattern Step-through pattern;Scissoring;Wide base of support;Decreased stride length   Gait Comments During gait, noted staggering, minimal head movement, en bloc turning, intermittent LE scissoring with lateral LOB; tends to hold onto nearby  objects and walls.            Vestibular Assessment - 02/05/16 0001    Vestibular Assessment   General Observation wide BOS, guarded gait pattern, reaches for nearby objects/walls when ambulating; posterior LOB when attempting to bend over anteriorly   Symptom Behavior   Type of Dizziness Spinning   Frequency of Dizziness every time pt turns quickly, gets OOB, stands up quickly, or bends over   Duration of Dizziness seconds   Aggravating Factors Looking up to the ceiling;Sitting with head tilted back;Turning body quickly;Turning head quickly;Sit to stand   Relieving Factors No known relieving factors   Occulomotor Exam   Occulomotor  Alignment Normal   Spontaneous Absent   Gaze-induced Left beating nystagmus with L gaze   Smooth Pursuits Saccades   Saccades Dysmetria   Comment Saccades: hypometric in all directions.   Vestibulo-Occular Reflex   VOR 1 Head Only (x 1 viewing) with horizontal head turns, pt reports 2/10 dizziness    VOR Cancellation Corrective saccades  "would bother me if I did it long enough"   Positional Testing   Dix-Hallpike Dix-Hallpike Right;Dix-Hallpike Left   Sidelying Test Sidelying Right   Horizontal Canal Testing Horizontal Canal Right;Horizontal Canal Left   Dix-Hallpike Right   Dix-Hallpike Right Duration NA  however, unable to achieve adequate neck extension   Dix-Hallpike Right Symptoms No nystagmus   Dix-Hallpike Left   Dix-Hallpike Left Duration NA  however, unable to achieve adequate neck extension   Dix-Hallpike Left Symptoms No nystagmus   Sidelying Right   Sidelying Right Duration Approx 5-10 seconds   Sidelying Right Symptoms Upbeat, right rotatory nystagmus   Horizontal Canal Right   Horizontal Canal Right Duration Approx. 5 seconds of pt-reported vertigo; difficult to visualize nystagmus due to short duration, but appears geotrophic.   Horizontal Canal Right Symptoms Other (comment)  difficult to visualize due to short duration   Horizontal Canal Left   Horizontal Canal Left Duration 15-20 seconds of geotrophic. After geotrophic nystagmus resolved, noted vertical downbeating nystagmus which fatigued within 10 seconds.   Horizontal Canal Left Symptoms Geotrophic               [facility] Adult PT Treatment/Exercise - 02/05/16 0001    Transfers   Transfers Sit to Stand;Stand to Sit   Sit to Stand 4: Min guard   Stand to Sit 4: Min guard   Ambulation/Gait   Ambulation/Gait Yes   Ambulation/Gait Assistance 4: Min guard   Ambulation Distance (Feet) 200 Feet   Assistive device None   Ambulation Surface Level;Indoor         Vestibular Treatment/Exercise - 02/05/16  0001    Vestibular Treatment/Exercise   Vestibular Treatment Provided Canalith Repositioning   Canalith Repositioning Canal Roll Left   Canal Roll Left   Number of Reps  1   Overall Response  Improved Symptoms  per pt report   Response Details  Canal Roll modified to L sidelying > supine > R sidelying > prone due to limited cervical spine ROM. Did not reassess due to time constraint.               PT Education - 02/05/16 1247    Education provided Yes   Education Details PT eval findings, goals, and POC. Recommended that patient use rolling walker for mobility at all times to prevent falls.   Person(s) Educated Patient   Methods Explanation   Comprehension Verbalized understanding  PT Short Term Goals - 02/05/16 1310    PT SHORT TERM GOAL #1   Title STG's = LTG's           PT Long Term Goals - 02/05/16 1310    PT LONG TERM GOAL #1   Title Positional vertigo testing will be negative to indicate resolved BPPV.    (Target date: 03/04/16)   PT LONG TERM GOAL #2   Title Pt will improve DHI score from 54 to 36 36 to indicate significant decrease in pt-perceived disability due to dizziness.    PT LONG TERM GOAL #3   Title Assess strength and balance, if indicated, after vertigo clears.    PT LONG TERM GOAL #4   Title Pt will ambulate x200' over level, indoor surfaces with mod I using LRAD to indicate safety with household mobility.     PT LONG TERM GOAL #5   Title Pt will ambulate 300' over unlevel, paved surfaces with mod I using LRAD to indicate safety with short-distance community mobility.    Additional Long Term Goals   Additional Long Term Goals Yes   PT LONG TERM GOAL #6   Title Pt will perform HEP with mod I using paper handout to maximize functional gains made in PT.                Plan - 02/05/16 1253    Clinical Impression Statement Pt is an 82 y/o M referred to vestibular PT to address functional impairments associated with traumatic SDH  with associated hospitalization from 4/23 -01/28/16. PMH significant for: HTN, HLD, UTI, hearing loss. PT evaluation reveals the following: gait instability; saccadic smooth pursuits; hypometric saccades; impaired VOR and VOR cancellation; geotrophic nystagmus (intensity/duration on L > R) with horizontal canal testing; R upbeating torsional nystagmus on R Sidelying Test. Upon testing L horizontal canal, noted onset of vertical downbeating nystagmus x10 seconds after geotrophic nystagmus lasted for approx. 10-15 seconds. Unable to discern origin of vertical nystagmus (central vs. multi-canal BPPV), but will continue to assess as BPPV is treated. Based on findings, unable to rule out BPPV (left horizontal canalithiasis and R posterior canalithiasis) and vertigo of central origin. Therefore, treated with L Canal Roll (modified due to limited cervical spine ROM) x1, after which pt reporting improved symptoms but unable to reassess due to time constraint. Pt will benefit from skilled outpatient PT 2x/week for 4 weeks to address said impairments.     Rehab Potential Fair   Clinical Impairments Affecting Rehab Potential cognitive impairments; multifactoral dizziness (multi-canal BPPV and vertigo of central origin)   PT Frequency 2x / week   PT Duration 4 weeks   PT Treatment/Interventions Gait training;Stair training;Neuromuscular re-education;Cognitive remediation;Functional mobility training;Therapeutic activities;Patient/family education Addendum: Canolith Repositioning, Vestibular   PT Next Visit Plan Assess for BPPV and treat prn, starting with most severe (L HC vs. R PC). Initiate HEP. Ensure pt has been using RW for all mobility, as recommended. Ask about home environment (stairs?) and add goals prn.   Recommended Other Services Plan to speak with spouse (if present) to assess if pt's cognitive impairments are baseline.   Consulted and Agree with Plan of Care Patient      Patient will benefit from  skilled therapeutic intervention in order to improve the following deficits and impairments:  Decreased cognition, Abnormal gait, Decreased balance, Dizziness  Visit Diagnosis: BPPV (benign paroxysmal positional vertigo), left - Plan: PT plan of care cert/re-cert  BPPV (benign paroxysmal positional vertigo), right - Plan: PT  plan of care cert/re-cert  Dizziness and giddiness - Plan: PT plan of care cert/re-cert  Other abnormalities of gait and mobility - Plan: PT plan of care cert/re-cert  Unsteadiness on feet - Plan: PT plan of care cert/re-cert      G-Codes - 0000000 1310    Functional Assessment Tool Used DHI = 54   Functional Limitation Self care   Self Care Current Status [id number]) At least 40 percent but less than 60 percent impaired, limited or restricted   Self Care Goal Status [id number]) At least 20 percent but less than 40 percent impaired, limited or restricted       Problem List Patient Active Problem List   Diagnosis Date Noted  . SDH (subdural hematoma) ([place]) 01/28/2016  . BPPV (benign paroxysmal positional vertigo) 01/28/2016  . Cerebral infarction [facility]) - incidental, punctate R periventricular infarct 01/28/2016  . Essential hypertension 01/28/2016  . Hyperlipidemia LDL goal <70 01/28/2016  . Acute encephalopathy   . Traumatic hemorrhage of cerebrum with loss of consciousness ([place]) - tiny L caudate 01/24/2016    Twan Ruddy, PT, DPT [facility] [address] [place], [place], [address] Phone: [phone]   Fax:  [phone] 02/05/2016, 2:26 PM  Name: Casey Lee MRN: [id number] Date of Birth: May 28, 1928

## 2016-02-11 ENCOUNTER — Other Ambulatory Visit: Payer: Self-pay | Admitting: Neurology

## 2016-02-11 ENCOUNTER — Ambulatory Visit: Payer: Medicare Other | Admitting: Physical Therapy

## 2016-02-11 DIAGNOSIS — H8111 Benign paroxysmal vertigo, right ear: Secondary | ICD-10-CM

## 2016-02-11 DIAGNOSIS — R2689 Other abnormalities of gait and mobility: Secondary | ICD-10-CM

## 2016-02-11 DIAGNOSIS — R42 Dizziness and giddiness: Secondary | ICD-10-CM

## 2016-02-11 DIAGNOSIS — H8113 Benign paroxysmal vertigo, bilateral: Secondary | ICD-10-CM

## 2016-02-11 DIAGNOSIS — R2681 Unsteadiness on feet: Secondary | ICD-10-CM | POA: Diagnosis not present

## 2016-02-11 DIAGNOSIS — H8112 Benign paroxysmal vertigo, left ear: Secondary | ICD-10-CM | POA: Diagnosis not present

## 2016-02-11 MED ORDER — ONDANSETRON HCL 8 MG PO TABS: 8.0000 mg | ORAL_TABLET | Freq: Four times a day (QID) | ORAL | Status: DC | PRN

## 2016-02-11 NOTE — Addendum Note (Signed)
Addended by: Tige Ruddy A on: 02/11/2016 09:30 AM   Modules accepted: Orders

## 2016-02-11 NOTE — Progress Notes (Signed)
Pt experience nausea vomiting during BPPV maneuver training. As per PT request, will order zofran 8mg  Q6h PRN for N/V before next session to help him better tolerate maneuver training. I called pt and discussed with him and he agrees. Medication prescribed to his pharmacy and he is going to pick up.   Rosalin Hawking, MD PhD Stroke Neurology 02/11/2016 1:59 PM  Meds ordered this encounter  Medications  . ondansetron (ZOFRAN) 8 MG tablet    Sig: Take 1 tablet (8 mg total) by mouth every 6 (six) hours as needed for nausea or vomiting.    Dispense:  30 tablet    Refill:  0

## 2016-02-11 NOTE — Therapy (Signed)
[facility] [address] [place], [place], [address] Phone: [phone]   Fax:  [phone]  Physical Therapy Treatment  Patient Details  Name: Casey Lee MRN: [id number] Date of Birth: 20-Apr-1928 Referring Provider: Rosalin Hawking, MD  Encounter Date: 02/11/2016      PT End of Session - 02/11/16 0916    Visit Number 2   Number of Visits 9  eval + 8 visits   Date for PT Re-Evaluation 04/05/16   Authorization Type Medicare Traditional primary; BCBS secondary   Authorization Time Period G Codes required   PT Start Time 0802   PT Stop Time 0837   PT Time Calculation (min) 35 min   Activity Tolerance Other (comment)  Patient limited by nausea.   Behavior During Therapy [facility] for tasks assessed/performed      Past Medical History  Diagnosis Date  . High cholesterol   . Hearing loss   . UTI (lower urinary tract infection)     Past Surgical History  Procedure Laterality Date  . None      There were no vitals filed for this visit.      Subjective Assessment - 02/11/16 0805    Subjective "I'm having very little spinning when I roll over in bed and when I turn over in bed and when I turn my head really fast. You and my wife convinced me to use my cane." Pt denies falls.   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  PMH significant for: HTN, HLD, UTI, hearing loss   Diagnostic tests Head CT (4/24): two small hemorrhagic spots, one ICH and one SDH; two tiny remote left cerebellar infarcts; chronic small vessel disease.   Patient Stated Goals "To get rid of the vertigo."   Currently in Pain? No/denies                Vestibular Assessment - 02/11/16 0001    Symptom Behavior   Type of Dizziness Spinning   Frequency of Dizziness daily when getting into/out of bed and when turning head quickly   Duration of Dizziness "a split second"   Positional Testing   Dix-Hallpike --   Sidelying Test Sidelying  Right   Dix-Hallpike Right   Dix-Hallpike Right Duration 10 seconds   Dix-Hallpike Right Symptoms Upbeat, right rotatory nystagmus   Horizontal Canal Right   Horizontal Canal Right Duration 40 seconds of geotropic nystagmus followed by ageotropic nystagmus x15-20 seconds   Horizontal Canal Right Symptoms Geotrophic;Ageotrophic;Nystagmus   Horizontal Canal Left   Horizontal Canal Left Duration 25 seconds of geotrophic nystagmus followed by ageotropic nystagmus visible only when pt looking in direction of nystagmus.   Horizontal Canal Left Symptoms Geotrophic;Ageotrophic;Nystagmus                 [facility] Adult PT Treatment/Exercise - 02/11/16 0001    Ambulation/Gait   Ambulation/Gait Yes   Ambulation/Gait Assistance 4: Min guard;4: Min assist;1: +2 Total assist  +2 for additonal PT to provide min guard when pt leaving   Ambulation/Gait Assistance Details Using SPC, pt min guard when ambulating into clinic. Left clinic with SPC, L HHA, and additional therapist providing min guard due to significant dizziness   Ambulation Distance (Feet) 200 Feet  2 x100'   Assistive device 1 person hand held assist;Straight cane   Ambulation Surface Level;Indoor                PT Education - 02/11/16 [phone]    Education provided  Yes   Education Details Explained multi-canal BPPV. Strongly recommended pt not drive today and offered to call wife. Explained that this PT will contact referring MD to see if nausea medication may be appropriate prior to initiating treatment.    Person(s) Educated Patient   Methods Explanation  Pt in agreement with all education but politely declined recommendation for wife to drive him today.   Comprehension Verbalized understanding          PT Short Term Goals - 02/05/16 1310    PT SHORT TERM GOAL #1   Title STG's = LTG's           PT Long Term Goals - 02/05/16 1310    PT LONG TERM GOAL #1   Title Positional vertigo testing will be negative to  indicate resolved BPPV.    (Target date: 03/04/16)   PT LONG TERM GOAL #2   Title Pt will improve DHI score from 54 to 36 36 to indicate significant decrease in pt-perceived disability due to dizziness.    PT LONG TERM GOAL #3   Title Assess strength and balance, if indicated, after vertigo clears.    PT LONG TERM GOAL #4   Title Pt will ambulate x200' over level, indoor surfaces with mod I using LRAD to indicate safety with household mobility.     PT LONG TERM GOAL #5   Title Pt will ambulate 300' over unlevel, paved surfaces with mod I using LRAD to indicate safety with short-distance community mobility.    Additional Long Term Goals   Additional Long Term Goals Yes   PT LONG TERM GOAL #6   Title Pt will perform HEP with mod I using paper handout to maximize functional gains made in PT.                Plan - 02/11/16 0917    Clinical Impression Statement Session focused on continuing to assess/address BPPV. R horizontal canal testing reveals geotropic nystagmus x40 seconds followed by ageotropic nystagmus x15-20 seconds. L horizontal canal testing indicated geotropic nystagmus x25 seconds followed by ageotropic nystagmus for approx. 10 seconds which was only visible when pt looking in direction of nystagmus. Nystagmus duration and intensity on R > L with horizontal canal testing. Also noted R upbeating torsional nystagmus for approx. 10 seconds in R Sidelying Test. Based on findings, unable to rule out L horizontal cupulolithasis, R horizontal canalithasis, and R posterior canalithiasis. Pt experiencing significant nausea with testing today. Also, pt drove himself to this session. Therefore, PT deferred treatment of BPPV today and plan to contact referring MD to discuss if appropriate to premedicate pt for nausea prior to treating BPPV. PT strongly recommended that pt not drive today and offered to call wife to drive pt home. Pt politely declined, but staff member did walk patient to his car  to ensure safety.    Rehab Potential Fair   Clinical Impairments Affecting Rehab Potential cognitive impairments; multifactoral dizziness (multi-canal BPPV and vertigo of central origin)   PT Frequency 2x / week   PT Duration 4 weeks   PT Treatment/Interventions Gait training;Stair training;Neuromuscular re-education;Cognitive remediation;Functional mobility training;Therapeutic activities;Patient/family education;Vestibular;Canalith Repostioning   PT Next Visit Plan Assess for BPPV and treat prn, starting with most severe, which appears to be R horizontal canalithiasis, L horizontal cupulolithasis, then R posterior canalithiasis.   Consulted and Agree with Plan of Care Patient      Patient will benefit from skilled therapeutic intervention in order to improve the following deficits  and impairments:  Decreased cognition, Abnormal gait, Decreased balance, Dizziness  Visit Diagnosis: BPPV (benign paroxysmal positional vertigo), left  BPPV (benign paroxysmal positional vertigo), right  Dizziness and giddiness  Other abnormalities of gait and mobility     Problem List Patient Active Problem List   Diagnosis Date Noted  . SDH (subdural hematoma) ([place]) 01/28/2016  . BPPV (benign paroxysmal positional vertigo) 01/28/2016  . Cerebral infarction [facility]) - incidental, punctate R periventricular infarct 01/28/2016  . Essential hypertension 01/28/2016  . Hyperlipidemia LDL goal <70 01/28/2016  . Acute encephalopathy   . Traumatic hemorrhage of cerebrum with loss of consciousness ([place]) - tiny L caudate 01/24/2016    Kelechi Ruddy, PT, DPT [facility] [address] [place], [place], [address] Phone: [phone]   Fax:  [phone] 02/11/2016, 9:28 AM  Name: Casey Lee MRN: [id number] Date of Birth: 1928/08/02

## 2016-02-16 ENCOUNTER — Encounter: Payer: Medicare Other | Admitting: Physical Therapy

## 2016-02-16 DIAGNOSIS — E789 Disorder of lipoprotein metabolism, unspecified: Secondary | ICD-10-CM | POA: Diagnosis not present

## 2016-02-16 DIAGNOSIS — R42 Dizziness and giddiness: Secondary | ICD-10-CM | POA: Diagnosis not present

## 2016-02-17 ENCOUNTER — Ambulatory Visit: Payer: Medicare Other | Admitting: Physical Therapy

## 2016-02-17 DIAGNOSIS — R2689 Other abnormalities of gait and mobility: Secondary | ICD-10-CM

## 2016-02-17 DIAGNOSIS — R42 Dizziness and giddiness: Secondary | ICD-10-CM | POA: Diagnosis not present

## 2016-02-17 DIAGNOSIS — H8112 Benign paroxysmal vertigo, left ear: Secondary | ICD-10-CM | POA: Diagnosis not present

## 2016-02-17 DIAGNOSIS — R2681 Unsteadiness on feet: Secondary | ICD-10-CM | POA: Diagnosis not present

## 2016-02-17 DIAGNOSIS — H8111 Benign paroxysmal vertigo, right ear: Secondary | ICD-10-CM | POA: Diagnosis not present

## 2016-02-17 NOTE — Patient Instructions (Signed)
Bill, start this exercise on 5/18.  Tip Card 1.The goal of habituation training is to assist in decreasing symptoms of vertigo, dizziness, or nausea provoked by specific head and body motions. 2.These exercises may initially increase symptoms; however, be persistent and work through symptoms. With repetition and time, the exercises will assist in reducing or eliminating symptoms. 3.Exercises should be stopped and discussed with the therapist if you experience any of the following: - Sudden change or fluctuation in hearing - New onset of ringing in the ears, or increase in current intensity - Any fluid discharge from the ear - Severe pain in neck or back - Extreme nausea  Copyright  VHI. All rights reserved.  Rolling   With pillow under head, start on back. Roll to your right side.  Hold until dizziness stops, plus 20 seconds and then roll to the left side.  Hold until dizziness stops, plus 20 seconds.  Repeat sequence 5 times per session. Do 2 sessions per day.

## 2016-02-17 NOTE — Therapy (Signed)
[facility] [address] [place], [place], [address] Phone: [phone]   Fax:  [phone]  Physical Therapy Treatment  Patient Details  Name: RAYMEL VANDONGEN MRN: [id number] Date of Birth: 12/06/1927 Referring Provider: Rosalin Hawking, MD  Encounter Date: 02/17/2016      PT End of Session - 02/17/16 1312    Visit Number 3   Number of Visits 9  eval + 8 visits   Date for PT Re-Evaluation 04/05/16   Authorization Type Medicare Traditional primary; BCBS secondary   Authorization Time Period G Codes required   PT Start Time 0934   PT Stop Time 1017   PT Time Calculation (min) 43 min   Activity Tolerance Patient tolerated treatment well   Behavior During Therapy [facility] for tasks assessed/performed      Past Medical History  Diagnosis Date  . High cholesterol   . Hearing loss   . UTI (lower urinary tract infection)     Past Surgical History  Procedure Laterality Date  . None      There were no vitals filed for this visit.      Subjective Assessment - 02/17/16 0944    Subjective "If 10 is normal, I feel like a 9 this morning. I didn't take that nausea medication, and I'll tell you why: because I took it in the hospital and I can't go to the bathroom when I take it. I slept the entire night on my right side for the first time in years. I felt awful until 12 pm the next day, but I've felt pretty okay since."   Pertinent History Likes to be called "Bill".  PMH significant for: HTN, HLD, UTI, hearing loss   Diagnostic tests Head CT (4/24): two small hemorrhagic spots, one ICH and one SDH; two tiny remote left cerebellar infarcts; chronic small vessel disease.   Patient Stated Goals "To get rid of the vertigo."   Currently in Pain? No/denies                Vestibular Assessment - 02/17/16 0001    Positional Testing   Horizontal Canal Testing Horizontal Canal Right;Horizontal Canal Left;Horizontal Canal  Right Intensity;Horizontal Canal Left Intensity   Horizontal Canal Right   Horizontal Canal Right Duration Approx. 15 seconds.    Horizontal Canal Right Symptoms Ageotrophic;Nystagmus   Horizontal Canal Left   Horizontal Canal Left Duration Approx. 15-20 seconds of nystagmus; appears horizontal, but unable to discern directional component   Horizontal Canal Left Symptoms Ageotrophic;Geotrophic;Nystagmus;Other (comment)   Horizontal Canal Right Intensity   Horizontal Canal Right Intensity Mild   Horizontal Canal Left Intensity   Horizontal Canal Left Intensity Mild   Left Intensity Comment "About the same as the other side", per pt                 [facility] Adult PT Treatment/Exercise - 02/17/16 0001    Ambulation/Gait   Ambulation/Gait Yes   Ambulation/Gait Assistance 5: Supervision   Ambulation/Gait Assistance Details carrying (but not consistently utilizing) SPC   Ambulation Distance (Feet) 275 Feet   Assistive device None   Gait Pattern Step-through pattern;Decreased stride length  minimal head movement, but no LE scissoring or staggering   Ambulation Surface Level;Indoor   Gait Comments PT provided cueing for pt to utilize gaze fixation, as needed, to address dysequilibrium during gait.  Pt with effective return demo   Self-Care   Self-Care Other Self-Care Comments   Other Self-Care Comments  PT explained  and demonstrated horizontal rolling for habituation with verbal understanding from pt. Paper handout provided.         Vestibular Treatment/Exercise - 02/17/16 0001    Vestibular Treatment/Exercise   Vestibular Treatment Provided Canalith Repositioning   Canalith Repositioning Comment   Canal Roll Left   Number of Reps  1  following L Gufoni x2   Overall Response  Improved Symptoms   Response Details  Canal Roll modified to L sidelying > supine > R sidelying > semi-prone due to limited cervical spine ROM. Did not reassess due to time constraint.   OTHER   Comment  L Gufoni Maneuver (seated > L side lying with neutral head position, hold x60 sec followed by 45 degrees of R cervical rotation, hold for 60 seconds) x2 consecutive trials. Unable to discern directional component of nystagmus during initial Gufoni maneuver; however, during second maneuver, nystagmus clearly geotropic.               PT Education - 02/17/16 1304    Education provided Yes   Education Details HEP; horizontal rolling for HC habituation.    Person(s) Educated Patient   Methods Demonstration;Explanation;Handout   Comprehension Verbalized understanding          PT Short Term Goals - 02/05/16 1310    PT SHORT TERM GOAL #1   Title STG's = LTG's           PT Long Term Goals - 02/05/16 1310    PT LONG TERM GOAL #1   Title Positional vertigo testing will be negative to indicate resolved BPPV.    (Target date: 03/04/16)   PT LONG TERM GOAL #2   Title Pt will improve DHI score from 54 to 36 36 to indicate significant decrease in pt-perceived disability due to dizziness.    PT LONG TERM GOAL #3   Title Assess strength and balance, if indicated, after vertigo clears.    PT LONG TERM GOAL #4   Title Pt will ambulate x200' over level, indoor surfaces with mod I using LRAD to indicate safety with household mobility.     PT LONG TERM GOAL #5   Title Pt will ambulate 300' over unlevel, paved surfaces with mod I using LRAD to indicate safety with short-distance community mobility.    Additional Long Term Goals   Additional Long Term Goals Yes   PT LONG TERM GOAL #6   Title Pt will perform HEP with mod I using paper handout to maximize functional gains made in PT.                Plan - 02/17/16 1312    Clinical Impression Statement Session focused on asessing/addressing ongoing BPPV. No clear signs/symptoms of R canalithiasis during horizontal canal testing during this session. However, able to clearly visualize R ageotrophic nystagmus when testing R horizontal canal.  Therefore, treated with L Gufoni x2 consecutive trials followed by L [employer]. Pt reports improved symptoms and demonstrates signifccant improvement in gait stability.    Rehab Potential Fair   Clinical Impairments Affecting Rehab Potential cognitive impairments; multifactoral dizziness (multi-canal BPPV and vertigo of central origin)   PT Frequency 2x / week   PT Duration 4 weeks   PT Treatment/Interventions Gait training;Stair training;Neuromuscular re-education;Cognitive remediation;Functional mobility training;Therapeutic activities;Patient/family education;Vestibular;Canalith Repostioning   PT Next Visit Plan Assess for BPPV and treat prn   Consulted and Agree with Plan of Care Patient      Patient will benefit from skilled therapeutic intervention in  order to improve the following deficits and impairments:  Decreased cognition, Abnormal gait, Decreased balance, Dizziness  Visit Diagnosis: BPPV (benign paroxysmal positional vertigo), left  Dizziness and giddiness  Other abnormalities of gait and mobility     Problem List Patient Active Problem List   Diagnosis Date Noted  . SDH (subdural hematoma) ([place]) 01/28/2016  . BPPV (benign paroxysmal positional vertigo) 01/28/2016  . Cerebral infarction [facility]) - incidental, punctate R periventricular infarct 01/28/2016  . Essential hypertension 01/28/2016  . Hyperlipidemia LDL goal <70 01/28/2016  . Acute encephalopathy   . Traumatic hemorrhage of cerebrum with loss of consciousness ([place]) - tiny L caudate 01/24/2016   Darean Ruddy, PT, DPT [facility] [address] [place], [place], [address] Phone: [phone]   Fax:  [phone] 02/17/2016, 1:16 PM  Name: NICHOLS DEMAREST MRN: [id number] Date of Birth: 1928-06-01

## 2016-02-19 DIAGNOSIS — E789 Disorder of lipoprotein metabolism, unspecified: Secondary | ICD-10-CM | POA: Diagnosis not present

## 2016-02-22 ENCOUNTER — Ambulatory Visit: Payer: Medicare Other | Admitting: Physical Therapy

## 2016-02-22 DIAGNOSIS — R2681 Unsteadiness on feet: Secondary | ICD-10-CM | POA: Diagnosis not present

## 2016-02-22 DIAGNOSIS — H8111 Benign paroxysmal vertigo, right ear: Secondary | ICD-10-CM | POA: Diagnosis not present

## 2016-02-22 DIAGNOSIS — H8112 Benign paroxysmal vertigo, left ear: Secondary | ICD-10-CM

## 2016-02-22 DIAGNOSIS — R42 Dizziness and giddiness: Secondary | ICD-10-CM | POA: Diagnosis not present

## 2016-02-22 DIAGNOSIS — R2689 Other abnormalities of gait and mobility: Secondary | ICD-10-CM | POA: Diagnosis not present

## 2016-02-22 NOTE — Therapy (Signed)
[facility] [address] [place], [place], [address] Phone: [phone]   Fax:  [phone]  Physical Therapy Treatment  Patient Details  Name: Casey Lee MRN: [id number] Date of Birth: 1928-06-09 Referring Provider: Rosalin Hawking, MD  Encounter Date: 02/22/2016      PT End of Session - 02/22/16 0933    Visit Number 4   Number of Visits 9  eval + 8 vists   Date for PT Re-Evaluation 04/05/16   Authorization Type Medicare Traditional primary; BCBS secondary   Authorization Time Period G Codes required   PT Start Time 0844   PT Stop Time 0928   PT Time Calculation (min) 44 min   Activity Tolerance Patient tolerated treatment well   Behavior During Therapy [facility] for tasks assessed/performed      Past Medical History  Diagnosis Date  . High cholesterol   . Hearing loss   . UTI (lower urinary tract infection)     Past Surgical History  Procedure Laterality Date  . None      There were no vitals filed for this visit.      Subjective Assessment - 02/22/16 0845    Subjective No dizziness with HEP; no vertiginous symptoms when getting into/out of bed. "My head is not as heavy. It feels like it's just gradually getting normal."   Pertinent History Likes to be called "Bill".  PMH significant for: HTN, HLD, UTI, hearing loss   Diagnostic tests Head CT (4/24): two small hemorrhagic spots, one ICH and one SDH; two tiny remote left cerebellar infarcts; chronic small vessel disease.   Patient Stated Goals "To get rid of the vertigo."   Currently in Pain? No/denies                Vestibular Assessment - 02/22/16 0001    Horizontal Canal Right   Horizontal Canal Right Duration ageotropic nystagmus only x30 seconds; asymptomatic   Horizontal Canal Right Symptoms Ageotrophic;Nystagmus   Horizontal Canal Left   Horizontal Canal Left Duration geotropic nystagmus x20 seconds accompanied by pt-reported  "wozziness" followed by onset of ageotropic nystagmus x15 sec and asymptomatic   Horizontal Canal Left Symptoms Geotrophic;Ageotrophic;Nystagmus   Horizontal Canal Left Intensity   Horizontal Canal Left Intensity Moderate                 [facility] Adult PT Treatment/Exercise - 02/22/16 0001    Ambulation/Gait   Ambulation/Gait Yes   Ambulation/Gait Assistance 4: Min guard;5: Supervision   Ambulation/Gait Assistance Details Gait with horizontal head turns (no AD) wiith min guard due to gait veering in direction of head turn. Gait with vertical head turns with (S).   Ambulation Distance (Feet) 300 Feet   Assistive device None;Straight cane;Other (Comment)  arrived using SPC (per PT recommendation);no SPC during gait   Gait Pattern Step-through pattern  gait veers in direction of horizontal head turn   Ambulation Surface Level;Indoor         Vestibular Treatment/Exercise - 02/22/16 0001    Vestibular Treatment/Exercise   Vestibular Treatment Provided Canalith Repositioning;Habituation;Gaze   Canalith Repositioning Canal Roll Left   Habituation Exercises Horizontal Roll   Gaze Exercises X1 Viewing Horizontal   Canal Roll Left   Number of Reps  1   Overall Response  Improved Symptoms   Response Details  Reassessment of horizontal canals reveals the following: Modified L Roll Test with geotropic nystagus x10 seconds;    Horizontal Roll   Number of Reps  5  Symptom Description  fast rolling x5 reps with progressive improvement in symptoms   X1 Viewing Horizontal   Foot Position standing; shoulder width with single UE support on chair   Reps 2   Comments 2 x30 seconds with cueing to decrease speed of head movement to keep target in focus               PT Education - 02/22/16 0936    Education provided Yes   Education Details HEP: modified rolling to fast rolling; added gaze stabilization.   Person(s) Educated Patient   Methods Explanation;Demonstration;Verbal  cues;Handout   Comprehension Verbalized understanding;Returned demonstration;Need further instruction  May need to review HEP at next session.          PT Short Term Goals - 02/05/16 1310    PT SHORT TERM GOAL #1   Title STG's = LTG's           PT Long Term Goals - 02/05/16 1310    PT LONG TERM GOAL #1   Title Positional vertigo testing will be negative to indicate resolved BPPV.    (Target date: 03/04/16)   PT LONG TERM GOAL #2   Title Pt will improve DHI score from 54 to 36 36 to indicate significant decrease in pt-perceived disability due to dizziness.    PT LONG TERM GOAL #3   Title Assess strength and balance, if indicated, after vertigo clears.    PT LONG TERM GOAL #4   Title Pt will ambulate x200' over level, indoor surfaces with mod I using LRAD to indicate safety with household mobility.     PT LONG TERM GOAL #5   Title Pt will ambulate 300' over unlevel, paved surfaces with mod I using LRAD to indicate safety with short-distance community mobility.    Additional Long Term Goals   Additional Long Term Goals Yes   PT LONG TERM GOAL #6   Title Pt will perform HEP with mod I using paper handout to maximize functional gains made in PT.                Plan - 02/22/16 1731    Clinical Impression Statement Testing of L horizontal canal revealed L geotropic nystagmus x20 seconds and symptomatic; ageotropic nystagmus completely asymptomatic and present with testing of B horizontal canals, with duration on R > L. Treated L horizontal canalithasis with L Canal Roll x1 trial, then educated pt on fast rolling HEP. Will reassess at next session (tomorrow, 5/23) and treat prn. Gait much more stable suring this session as compared with previous sessions.    Rehab Potential Fair   Clinical Impairments Affecting Rehab Potential cognitive impairments; multifactoral dizziness (multi-canal BPPV and vertigo of central origin)   PT Frequency 2x / week   PT Duration 4 weeks   PT  Treatment/Interventions Gait training;Stair training;Neuromuscular re-education;Cognitive remediation;Functional mobility training;Therapeutic activities;Patient/family education;Vestibular;Canalith Repostioning   PT Next Visit Plan Assess for BPPV and treat prn.    Consulted and Agree with Plan of Care Patient      Patient will benefit from skilled therapeutic intervention in order to improve the following deficits and impairments:  Decreased cognition, Abnormal gait, Decreased balance, Dizziness  Visit Diagnosis: BPPV (benign paroxysmal positional vertigo), left  BPPV (benign paroxysmal positional vertigo), right  Dizziness and giddiness     Problem List Patient Active Problem List   Diagnosis Date Noted  . SDH (subdural hematoma) ([place]) 01/28/2016  . BPPV (benign paroxysmal positional vertigo) 01/28/2016  . Cerebral infarction ([place]) -  incidental, punctate R periventricular infarct 01/28/2016  . Essential hypertension 01/28/2016  . Hyperlipidemia LDL goal <70 01/28/2016  . Acute encephalopathy   . Traumatic hemorrhage of cerebrum with loss of consciousness ([place]) - tiny L caudate 01/24/2016    Mouhamed Ruddy, PT, DPT [facility] [address] [place], [place], [address] Phone: [phone]   Fax:  [phone] 02/22/2016, 5:35 PM  Name: TREVION BASER MRN: [id number] Date of Birth: Jan 18, 1928

## 2016-02-22 NOTE — Patient Instructions (Addendum)
Tip Card 1.The goal of habituation training is to assist in decreasing symptoms of vertigo, dizziness, or nausea provoked by specific head and body motions. 2.These exercises may initially increase symptoms; however, be persistent and work through symptoms. With repetition and time, the exercises will assist in reducing or eliminating symptoms. 3.Exercises should be stopped and discussed with the therapist if you experience any of the following: - Sudden change or fluctuation in hearing - New onset of ringing in the ears, or increase in current intensity - Any fluid discharge from the ear - Severe pain in neck or back - Extreme nausea  Copyright  VHI. All rights reserved.  Rolling   With pillow under head, start on back. Roll quickly to your right side. Hold until dizziness stops, plus 20 seconds and then roll quickly to the left side without stopping on your back. Hold until dizziness stops, plus 20 seconds. Repeat sequence 5 times per session. Do 2 sessions per day.    Gaze Stabilization: Standing Feet Apart    Feet shoulder width apart, keeping eyes on target ("A") on wall __5__ feet away, tilt head down slightly and move head side to side for __30__ seconds. Do _2_ sessions per day.  Remember: - Keep the target in focus. - As long as target ("A") is in focus, increase speed of head movement.

## 2016-02-23 ENCOUNTER — Ambulatory Visit: Payer: Medicare Other | Admitting: Physical Therapy

## 2016-02-23 DIAGNOSIS — R42 Dizziness and giddiness: Secondary | ICD-10-CM

## 2016-02-23 DIAGNOSIS — H8111 Benign paroxysmal vertigo, right ear: Secondary | ICD-10-CM | POA: Diagnosis not present

## 2016-02-23 DIAGNOSIS — H8112 Benign paroxysmal vertigo, left ear: Secondary | ICD-10-CM

## 2016-02-23 DIAGNOSIS — R2681 Unsteadiness on feet: Secondary | ICD-10-CM | POA: Diagnosis not present

## 2016-02-23 DIAGNOSIS — R2689 Other abnormalities of gait and mobility: Secondary | ICD-10-CM | POA: Diagnosis not present

## 2016-02-23 NOTE — Therapy (Signed)
[facility] [address] [place], [place], [address] Phone: [phone]   Fax:  [phone]  Physical Therapy Treatment  Patient Details  Name: Casey Lee MRN: [id number] Date of Birth: October 02, 1928 Referring Provider: Rosalin Hawking, MD  Encounter Date: 02/23/2016      PT End of Session - 02/23/16 1426    Visit Number 5   Number of Visits 9   Date for PT Re-Evaluation 04/05/16   Authorization Type Medicare Traditional primary; BCBS secondary   Authorization Time Period G Codes required   PT Start Time 0800   PT Stop Time [id number]  Further treatment not indicated at this time.   PT Time Calculation (min) 35 min   Activity Tolerance Patient tolerated treatment well   Behavior During Therapy [facility] for tasks assessed/performed      Past Medical History  Diagnosis Date  . High cholesterol   . Hearing loss   . UTI (lower urinary tract infection)     Past Surgical History  Procedure Laterality Date  . None      There were no vitals filed for this visit.      Subjective Assessment - 02/23/16 0801    Subjective "You know that new exercise you gave me really helps. It helps me with driving."   Pertinent History Likes to be called "Bill".  PMH significant for: HTN, HLD, UTI, hearing loss   Diagnostic tests Head CT (4/24): two small hemorrhagic spots, one ICH and one SDH; two tiny remote left cerebellar infarcts; chronic small vessel disease.   Patient Stated Goals "To get rid of the vertigo."   Currently in Pain? No/denies                Vestibular Assessment - 02/23/16 0001    Positional Testing   Dix-Hallpike Dix-Hallpike Right   Sidelying Test Sidelying Right;Sidelying Left   Dix-Hallpike Right   Dix-Hallpike Right Duration 10 seconds   Dix-Hallpike Right Symptoms Upbeat, right rotatory nystagmus   Sidelying Right   Sidelying Right Duration 3 seconds  after R Epley x2   Sidelying Right Symptoms  No nystagmus   Sidelying Left   Sidelying Left Duration 10-15 seconds   Sidelying Left Symptoms Upbeat, left rotatory nystagmus                 [facility] Adult PT Treatment/Exercise - 02/23/16 0001    Ambulation/Gait   Ambulation/Gait Yes   Ambulation/Gait Assistance 6: Modified independent (Device/Increase time)   Ambulation Distance (Feet) 300 Feet   Assistive device None   Gait Pattern Step-through pattern  gait veers in direction of horizontal head turn   Ambulation Surface Level;Unlevel;Indoor;Outdoor;Paved         Vestibular Treatment/Exercise - 02/23/16 0001    Vestibular Treatment/Exercise   Vestibular Treatment Provided Canalith Repositioning;Habituation;Gaze   Canalith Repositioning Epley Manuever Right   Habituation Exercises [employer]   Gaze Exercises X1 Viewing Horizontal    EPLEY MANUEVER RIGHT   Number of Reps  2   Overall Response Improved Symptoms   Response Details  Reassessment of R Sidelying Test reveals R upbeating torsional nystagmus x3 seconds accompanied by transient symptoms.   [name]   Number of Reps  5   Symptom Description  Improved symptoms and decreased duration off nystagmus (to < 5 seconds, bilaterally) on final rep in each direction   X1 Viewing Horizontal   Foot Position standing   Reps 1   Comments x30 seconds with cueing for  proper neck positioning, speed, and amplitude of head movement.               PT Education - 02/23/16 0841    Education provided Yes   Education Details HEP: added Brandt-Daroff for habituation.   Person(s) Educated Patient   Methods Explanation;Demonstration;Handout   Comprehension Verbalized understanding;Returned demonstration          PT Short Term Goals - 02/05/16 1310    PT SHORT TERM GOAL #1   Title STG's = LTG's           PT Long Term Goals - 02/05/16 1310    PT LONG TERM GOAL #1   Title Positional vertigo testing will be negative to indicate resolved BPPV.    (Target  date: 03/04/16)   PT LONG TERM GOAL #2   Title Pt will improve DHI score from 54 to 36 36 to indicate significant decrease in pt-perceived disability due to dizziness.    PT LONG TERM GOAL #3   Title Assess strength and balance, if indicated, after vertigo clears.    PT LONG TERM GOAL #4   Title Pt will ambulate x200' over level, indoor surfaces with mod I using LRAD to indicate safety with household mobility.     PT LONG TERM GOAL #5   Title Pt will ambulate 300' over unlevel, paved surfaces with mod I using LRAD to indicate safety with short-distance community mobility.    Additional Long Term Goals   Additional Long Term Goals Yes   PT LONG TERM GOAL #6   Title Pt will perform HEP with mod I using paper handout to maximize functional gains made in PT.                Plan - 02/23/16 1432    Clinical Impression Statement Bilateral horizontal canal BPPV appears to have resolved at this time. However, unable to rule out bilateral posterior canalithiasis. Therefore, treated R posterior canalithiasis with R Epley Manuever x2 trials and educated pt on Brandt-Daroff for habituation at home.    Rehab Potential Fair   Clinical Impairments Affecting Rehab Potential cognitive impairments; multifactoral dizziness (multi-canal BPPV and vertigo of central origin)   PT Frequency 2x / week   PT Duration 4 weeks   PT Treatment/Interventions Gait training;Stair training;Neuromuscular re-education;Cognitive remediation;Functional mobility training;Therapeutic activities;Patient/family education;Vestibular;Canalith Repostioning   PT Next Visit Plan Assess for BPPV and treat prn. If BPPV resolved, plan to DC. *FOTO.   Consulted and Agree with Plan of Care Patient      Patient will benefit from skilled therapeutic intervention in order to improve the following deficits and impairments:  Decreased cognition, Abnormal gait, Decreased balance, Dizziness  Visit Diagnosis: BPPV (benign paroxysmal  positional vertigo), left  BPPV (benign paroxysmal positional vertigo), right  Dizziness and giddiness     Problem List Patient Active Problem List   Diagnosis Date Noted  . SDH (subdural hematoma) ([place]) 01/28/2016  . BPPV (benign paroxysmal positional vertigo) 01/28/2016  . Cerebral infarction [facility]) - incidental, punctate R periventricular infarct 01/28/2016  . Essential hypertension 01/28/2016  . Hyperlipidemia LDL goal <70 01/28/2016  . Acute encephalopathy   . Traumatic hemorrhage of cerebrum with loss of consciousness ([place]) - tiny L caudate 01/24/2016    Henning Ruddy, PT, DPT [facility] [address] [place], [place], [address] Phone: [phone]   Fax:  [phone] 02/23/2016, 2:42 PM  Name: Casey Lee MRN: [id number] Date of Birth: 1928/05/20

## 2016-02-23 NOTE — Patient Instructions (Addendum)
Tip Card 1.The goal of habituation training is to assist in decreasing symptoms of vertigo, dizziness, or nausea provoked by specific head and body motions. 2.These exercises may initially increase symptoms; however, be persistent and work through symptoms. With repetition and time, the exercises will assist in reducing or eliminating symptoms. 3.Exercises should be stopped and discussed with the therapist if you experience any of the following: - Sudden change or fluctuation in hearing - New onset of ringing in the ears, or increase in current intensity - Any fluid discharge from the ear - Severe pain in neck or back - Extreme nausea  Copyright  VHI. All rights reserved.  Rolling   With pillow under head, start on back. Roll to your right side. Hold until dizziness stops, plus 20 seconds and then roll to the left side without stopping on your back. Hold until dizziness stops, plus 20 seconds. Repeat sequence 5 times per session. Do 2 sessions per day.   Side lying to/from Sit    Sit on edge of bed. Lie down onto the right side and hold until dizziness stops, plus 20 seconds.  Return to sitting and wait until dizziness stops, plus 20 seconds.  Repeat to the left side. Repeat sequence 5 times per session. Do 2 sessions per day.    Gaze Stabilization: Standing Feet Apart    Feet shoulder width apart, keeping eyes on target ("A") on wall __5__ feet away, tilt head down slightly and move head side to side for __30__ seconds. Do _2_ sessions per day.  Remember: - Keep the target in focus. - As long as target ("A") is in focus, increase speed of head movement.

## 2016-03-03 ENCOUNTER — Ambulatory Visit: Payer: Medicare Other | Attending: Endocrinology | Admitting: Physical Therapy

## 2016-03-03 DIAGNOSIS — H8112 Benign paroxysmal vertigo, left ear: Secondary | ICD-10-CM | POA: Diagnosis not present

## 2016-03-03 DIAGNOSIS — R42 Dizziness and giddiness: Secondary | ICD-10-CM

## 2016-03-03 NOTE — Patient Instructions (Addendum)
Tip Card 1.The goal of habituation training is to assist in decreasing symptoms of vertigo, dizziness, or nausea provoked by specific head and body motions. 2.These exercises may initially increase symptoms; however, be persistent and work through symptoms. With repetition and time, the exercises will assist in reducing or eliminating symptoms. 3.Exercises should be stopped and discussed with the therapist if you experience any of the following: - Sudden change or fluctuation in hearing - New onset of ringing in the ears, or increase in current intensity - Any fluid discharge from the ear - Severe pain in neck or back - Extreme nausea  Copyright  VHI. All rights reserved.  Rolling   With pillow under head, start on back. Roll to your right side. Hold until dizziness stops, plus 20 seconds and then roll to the left side without stopping on your back. Hold until dizziness stops, plus 20 seconds. Repeat sequence 5 times per session. Do 2 sessions per day.    Gaze Stabilization: Standing Feet Apart    Wear your glasses that you walk in. Try to look out through the top part of your lenses for this exercise.Feet shoulder width apart, keeping eyes on target ("A") on wall __5__ feet away, tilt head down slightly and move head side to side for __30__ seconds. Do _2_ sessions per day.  Remember: - Move in a small range (smaller head movements). - Keep the target in focus. - As long as target ("A") is in focus, increase speed of head movement.

## 2016-03-03 NOTE — Therapy (Signed)
[facility] [address] [place], [place], [address] Phone: [phone]   Fax:  [phone]  Physical Therapy Treatment  Patient Details  Name: Casey Lee MRN: [id number] Date of Birth: [birth year] Referring Provider: Rosalin Hawking, MD  Encounter Date: 03/03/2016      PT End of Session - 03/03/16 1210    Visit Number 6   Number of Visits 9   Date for PT Re-Evaluation 04/05/16   Authorization Type Medicare Traditional primary; BCBS secondary   Authorization Time Period G Codes required   PT Start Time [phone]   PT Stop Time 0930   PT Time Calculation (min) 41 min   Activity Tolerance Patient tolerated treatment well   Behavior During Therapy [facility] for tasks assessed/performed      Past Medical History  Diagnosis Date  . High cholesterol   . Hearing loss   . UTI (lower urinary tract infection)     Past Surgical History  Procedure Laterality Date  . None      There were no vitals filed for this visit.      Subjective Assessment - 03/03/16 0851    Subjective Pt reports "rarely" experiencing dizziness with exercises, stating, "It seems like it's on my left side, more than the right."   Pertinent History Likes to be called "Bill".  PMH significant for: HTN, HLD, UTI, hearing loss   Diagnostic tests Head CT (4/24): two small hemorrhagic spots, one ICH and one SDH; two tiny remote left cerebellar infarcts; chronic small vessel disease.   Patient Stated Goals "To get rid of the vertigo."   Currently in Pain? No/denies                Vestibular Assessment - 03/03/16 0001    Dix-Hallpike Left   Dix-Hallpike Left Duration < 5 seconds   Dix-Hallpike Left Symptoms Upbeat, left rotatory nystagmus   Sidelying Left   Sidelying Left Duration 10 seconds   Sidelying Left Symptoms Upbeat, left rotatory nystagmus   Horizontal Canal Right   Horizontal Canal Right Duration 25 seconds   Horizontal Canal Right  Symptoms Geotrophic   Horizontal Canal Left   Horizontal Canal Left Duration NA   Horizontal Canal Left Symptoms Normal   Horizontal Canal Right Intensity   Right Intensity Comment No symptoms   Horizontal Canal Left Intensity   Horizontal Canal Left Intensity Moderate                  Vestibular Treatment/Exercise - 03/03/16 0001    Vestibular Treatment/Exercise   Vestibular Treatment Provided Canalith Repositioning;Habituation;Gaze   Canalith Repositioning Epley Manuever Left;Canal Roll Left   Habituation Exercises Brandt Daroff;Horizontal Roll   Gaze Exercises X1 Viewing Horizontal    EPLEY MANUEVER LEFT   Number of Reps  1   Overall Response  No change    RESPONSE DETAILS LEFT Reassessment of L Sidelying Test revealed continued L upbeating torsional nystagmus; therefore, transitioned to re-education of Brandt-Daroff for habituation.   Canal Roll Left   Number of Reps  1   Overall Response  Symptoms Resolved   Response Details  Canal Roll modified to L sidelying > supine > R sidelying > prone due to limited cervical spine ROM.  During final position of maneuver, noted approx 30-second latency, after which pt experienced signiificant vertigo and PT noted R-beating horizontal nystagmus x15 seconds. Reassessment of horizontal canals revealed no symptoms and no nystagmus.   [name]   Number of Reps  5   Symptom Description  Performed [name] after L Epley. Noted no nystagmus, no symptoms on final trial. Did not advise pt perform at home due to pt confusion between [facility] and horizontal roll for habituation, and HC BPPV appeared more severe/limited the posterior canal BPPV.   X1 Viewing Horizontal   Foot Position standing   Reps 2   Comments 2 x30-sec trials with glasses; cueing to perform smaller amplitude head movements to decrease movement of visual target.               PT Education - 03/03/16 1202    Education provided Yes   Education  Details Reviewed horizontal rolling and gaze stabilization HEP.   Person(s) Educated Patient   Methods Explanation;Demonstration;Handout   Comprehension Returned demonstration;Verbalized understanding          PT Short Term Goals - 02/05/16 1310    PT SHORT TERM GOAL #1   Title STG's = LTG's           PT Long Term Goals - 02/05/16 1310    PT LONG TERM GOAL #1   Title Positional vertigo testing will be negative to indicate resolved BPPV.    (Target date: 03/04/16)   PT LONG TERM GOAL #2   Title Pt will improve DHI score from 54 to 36 36 to indicate significant decrease in pt-perceived disability due to dizziness.    PT LONG TERM GOAL #3   Title Assess strength and balance, if indicated, after vertigo clears.    PT LONG TERM GOAL #4   Title Pt will ambulate x200' over level, indoor surfaces with mod I using LRAD to indicate safety with household mobility.     PT LONG TERM GOAL #5   Title Pt will ambulate 300' over unlevel, paved surfaces with mod I using LRAD to indicate safety with short-distance community mobility.    Additional Long Term Goals   Additional Long Term Goals Yes   PT LONG TERM GOAL #6   Title Pt will perform HEP with mod I using paper handout to maximize functional gains made in PT.                Plan - 03/03/16 1211    Clinical Impression Statement No further signs/symptoms of R posterior canalithasis; however, testing suggests ongoing L posterior canalithasis. Treated with L Epley x1. Also noted geotrophic nystagmus with L Roll Rest suggestive of ongoing L horizontal canalithasis. Treated with L Canal Roll, after which horizontal canal testing was negative. Pt unable to recall Brandt-Daroff and seems to get rolling and Brandt-Daroff confused; therefore, instructed pt to continue x1 viewing and rolling for [facility] habituation at home.    Rehab Potential Fair   Clinical Impairments Affecting Rehab Potential cognitive impairments; multifactoral dizziness  (multi-canal BPPV and vertigo of central origin)   PT Frequency 2x / week   PT Duration 4 weeks   PT Treatment/Interventions Gait training;Stair training;Neuromuscular re-education;Cognitive remediation;Functional mobility training;Therapeutic activities;Patient/family education;Vestibular;Canalith Repostioning   PT Next Visit Plan Assess for BPPV and treat prn. If BPPV resolved, plan to DC. *FOTO.   Consulted and Agree with Plan of Care Patient      Patient will benefit from skilled therapeutic intervention in order to improve the following deficits and impairments:  Decreased cognition, Abnormal gait, Decreased balance, Dizziness  Visit Diagnosis: BPPV (benign paroxysmal positional vertigo), left  Dizziness and giddiness     Problem List Patient Active Problem List   Diagnosis Date Noted  .  SDH (subdural hematoma) ([place]) 01/28/2016  . BPPV (benign paroxysmal positional vertigo) 01/28/2016  . Cerebral infarction [facility]) - incidental, punctate R periventricular infarct 01/28/2016  . Essential hypertension 01/28/2016  . Hyperlipidemia LDL goal <70 01/28/2016  . Acute encephalopathy   . Traumatic hemorrhage of cerebrum with loss of consciousness ([place]) - tiny L caudate 01/24/2016    Alexey Ruddy, PT, DPT [facility] [address] [place], [place], [address] Phone: [phone]   Fax:  [phone] 03/03/2016, 12:15 PM  Name: Casey Lee MRN: [id number] Date of Birth: [birth year]

## 2016-03-04 ENCOUNTER — Ambulatory Visit: Payer: Medicare Other | Admitting: Physical Therapy

## 2016-03-04 DIAGNOSIS — H8112 Benign paroxysmal vertigo, left ear: Secondary | ICD-10-CM

## 2016-03-04 DIAGNOSIS — R42 Dizziness and giddiness: Secondary | ICD-10-CM | POA: Diagnosis not present

## 2016-03-04 NOTE — Therapy (Signed)
[facility] [address] [place], [place], [address] Phone: [phone]   Fax:  [phone]  Physical Therapy Treatment  Patient Details  Name: Casey Lee MRN: [id number] Date of Birth: 1928/05/12 Referring Provider: Rosalin Hawking, MD  Encounter Date: 03/04/2016      PT End of Session - 03/04/16 0920    Visit Number 7   Number of Visits 9   Date for PT Re-Evaluation 04/05/16   Authorization Type Medicare Traditional primary; BCBS secondary   Authorization Time Period G Codes required   PT Start Time 0850   PT Stop Time 0913   PT Time Calculation (min) 23 min   Activity Tolerance Patient tolerated treatment well   Behavior During Therapy [facility] for tasks assessed/performed      Past Medical History  Diagnosis Date  . High cholesterol   . Hearing loss   . UTI (lower urinary tract infection)     Past Surgical History  Procedure Laterality Date  . None      There were no vitals filed for this visit.      Subjective Assessment - 03/04/16 0904    Subjective "I popped over onto my right and left sides (when lying on back) last night, and I didn't feel any dizziness. But when I get out of the car, I feel a transient wooziness, then everything goes back to normal within a few seconds."   Pertinent History Likes to be called "Bill".  PMH significant for: HTN, HLD, UTI, hearing loss   Diagnostic tests Head CT (4/24): two small hemorrhagic spots, one ICH and one SDH; two tiny remote left cerebellar infarcts; chronic small vessel disease.   Patient Stated Goals "To get rid of the vertigo."   Currently in Pain? No/denies                Vestibular Assessment - 03/04/16 0001    Sidelying Right   Sidelying Right Duration 10 seconds   Sidelying Right Symptoms Upbeat, right rotatory nystagmus   Sidelying Left   Sidelying Left Duration NA   Sidelying Left Symptoms No nystagmus   Horizontal Canal Right   Horizontal Canal Right Duration NA   Horizontal Canal Right Symptoms Normal   Horizontal Canal Left   Horizontal Canal Left Duration NA   Horizontal Canal Left Symptoms Normal                  Vestibular Treatment/Exercise - 03/04/16 0001    Vestibular Treatment/Exercise   Vestibular Treatment Provided Habituation   [name]   Number of Reps  5  x2 trials   Symptom Description  < 5 seconds of nystagmus, vertiginous symptoms on last rep of R sidelying. Initial trial of 5 reps with PT providing verbal cueing for head position and technique (symptom duration + 20 seconds in each positoin). Subsequent trial of 5 reps with patient explaining and demonstrating [name] to this PT, which pt was able to perform effectively.               PT Education - 03/04/16 [phone]    Education provided Yes   Education Details Recommended pt perform supine <> sit for habituation only until next PT session in 7-10 days.   Person(s) Educated Patient   Methods Explanation;Demonstration;Handout   Comprehension Returned demonstration;Verbalized understanding          PT Short Term Goals - 02/05/16 1310    PT SHORT TERM GOAL #1   Title  STG's = LTG's           PT Long Term Goals - 02/05/16 1310    PT LONG TERM GOAL #1   Title Positional vertigo testing will be negative to indicate resolved BPPV.    (Target date: 03/04/16)   PT LONG TERM GOAL #2   Title Pt will improve DHI score from 54 to 36 36 to indicate significant decrease in pt-perceived disability due to dizziness.    PT LONG TERM GOAL #3   Title Assess strength and balance, if indicated, after vertigo clears.    PT LONG TERM GOAL #4   Title Pt will ambulate x200' over level, indoor surfaces with mod I using LRAD to indicate safety with household mobility.     PT LONG TERM GOAL #5   Title Pt will ambulate 300' over unlevel, paved surfaces with mod I using LRAD to indicate safety with short-distance community mobility.     Additional Long Term Goals   Additional Long Term Goals Yes   PT LONG TERM GOAL #6   Title Pt will perform HEP with mod I using paper handout to maximize functional gains made in PT.                Plan - 03/04/16 0920    Clinical Impression Statement No signs/symptoms of either horizontal canal BPPV or L posterior canal BPPV with positional testing during this session. R Sidelying Test reveals R upbeating torsional nystagmus, initially x10 seconds. Therefore, current session focused on ensuring pt able to effectively perform [name] for habituation at home. PT provided verbal cueing during initial trial of 5 reps of habituation, then pt effectively explained and demonstrated technique to this PT during subsequent trial. Pt planning to perform only [name] habituation at home every day for 7-10 days, then return to PT to ensure posterior canal BPPV is cleared. Pt in full agreement with POC.    Rehab Potential Good   Clinical Impairments Affecting Rehab Potential cognitive impairments; multifactoral dizziness (multi-canal BPPV and vertigo of central origin)   PT Frequency 2x / week   PT Duration 4 weeks   PT Treatment/Interventions Gait training;Stair training;Neuromuscular re-education;Cognitive remediation;Functional mobility training;Therapeutic activities;Patient/family education;Vestibular;Canalith Repostioning   PT Next Visit Plan Assess for BPPV. If BPPV resolved, plan to DC. *FOTO.   Consulted and Agree with Plan of Care Patient      Patient will benefit from skilled therapeutic intervention in order to improve the following deficits and impairments:  Decreased cognition, Abnormal gait, Decreased balance, Dizziness  Visit Diagnosis: BPPV (benign paroxysmal positional vertigo), left  Dizziness and giddiness     Problem List Patient Active Problem List   Diagnosis Date Noted  . SDH (subdural hematoma) ([place]) 01/28/2016  . BPPV (benign paroxysmal positional  vertigo) 01/28/2016  . Cerebral infarction [facility]) - incidental, punctate R periventricular infarct 01/28/2016  . Essential hypertension 01/28/2016  . Hyperlipidemia LDL goal <70 01/28/2016  . Acute encephalopathy   . Traumatic hemorrhage of cerebrum with loss of consciousness ([place]) - tiny L caudate 01/24/2016   Kerwin Ruddy, PT, DPT [facility] [address] [place], [place], [address] Phone: [phone]   Fax:  [phone] 03/04/2016, 9:24 AM  Name: Casey Lee MRN: [id number] Date of Birth: 20-Nov-1927

## 2016-03-04 NOTE — Patient Instructions (Signed)
Side lying to/from Sit    Sit on edge of bed. Lie down onto the right side and hold until dizziness stops, plus 20 seconds. Return to sitting and wait until dizziness stops, plus 20 seconds. Repeat to the left side. Repeat sequence 5 times per session. Do 2 sessions per day.

## 2016-03-16 ENCOUNTER — Ambulatory Visit: Payer: Medicare Other | Admitting: Physical Therapy

## 2016-03-17 ENCOUNTER — Encounter: Payer: Self-pay | Admitting: Neurology

## 2016-03-17 ENCOUNTER — Ambulatory Visit (INDEPENDENT_AMBULATORY_CARE_PROVIDER_SITE_OTHER): Payer: Medicare Other | Admitting: Neurology

## 2016-03-17 VITALS — BP 176/82 | HR 75 | Ht 68.0 in | Wt 141.2 lb

## 2016-03-17 DIAGNOSIS — I63311 Cerebral infarction due to thrombosis of right middle cerebral artery: Secondary | ICD-10-CM | POA: Diagnosis not present

## 2016-03-17 DIAGNOSIS — I639 Cerebral infarction, unspecified: Secondary | ICD-10-CM

## 2016-03-17 DIAGNOSIS — E785 Hyperlipidemia, unspecified: Secondary | ICD-10-CM | POA: Diagnosis not present

## 2016-03-17 DIAGNOSIS — I10 Essential (primary) hypertension: Secondary | ICD-10-CM | POA: Diagnosis not present

## 2016-03-17 DIAGNOSIS — H8113 Benign paroxysmal vertigo, bilateral: Secondary | ICD-10-CM | POA: Diagnosis not present

## 2016-03-17 NOTE — Patient Instructions (Addendum)
-   restart ASA 81mg  daily for stroke prevention - continue crestor for HLD and stroke prevention - Follow up with your primary care physician for stroke risk factor modification. Recommend maintain blood pressure goal <130/80, diabetes with hemoglobin A1c goal below 6.5% and lipids with LDL cholesterol goal below 70 mg/dL.  - check BP at home and record. - if BPPV happens, please repeat the maneuver to help the vertigo.  - follow up in 6 months.

## 2016-03-19 DIAGNOSIS — E785 Hyperlipidemia, unspecified: Secondary | ICD-10-CM | POA: Insufficient documentation

## 2016-03-19 MED ORDER — ASPIRIN EC 81 MG PO TBEC: 81.0000 mg | DELAYED_RELEASE_TABLET | Freq: Every day | ORAL | Status: DC

## 2016-03-19 NOTE — Progress Notes (Signed)
STROKE NEUROLOGY FOLLOW UP NOTE  NAME: BENYAMIN NAKAMURA DOB: [birth year]  REASON FOR VISIT: stroke follow up HISTORY FROM: pt and chart  Today we had the pleasure of seeing THELMA MUCK in follow-up at our Neurology Clinic. Pt was accompanied by no one.   History Summary Mr. TAURENCE GOOS is a 82 y.o. male with history of hypertension who fell and ? passed out ? loss of consciousness at home on 01/24/16 with resultant confusion and scalp laceration. CT of the head showed tiny left caudate body hemorrhage and tiny left parafalcine SDH, concerning for trauma related. MRI showed incidental DWI restriction adjacent to right occipital horn and two old tiny left cerebellar infarcts, likely small vessel disease. The 2nd day on admission, pt developed severe BPPV with positive dix-halpike test. Had PT/OT and maneuver training, pt condition gradually improved. EEG normal, repeat CT no significant change. Home ASA 81 discontinued. EF 55-60% and CUS negative. LDL 142 and A1C 6.3. He was put on pravastatin due to previous statin allergy. He was discharged in good condition with home PT and OT.  Interval History During the interval time, the patient has been doing well. Had home PT and outpt PT for BPPV and now resolved. He refused CT repeat after discharge. He is not on ASA so far. His pravastatin was changed to crestor 10mg . His BP 176/82 today but he said his BP at home is very good.   REVIEW OF SYSTEMS: Full 14 system review of systems performed and notable only for those listed below and in HPI above, all others are negative:  Constitutional:   Cardiovascular:  Ear/Nose/Throat:   Skin:  Eyes:   Respiratory:   Gastroitestinal:   Genitourinary:  Hematology/Lymphatic:   Endocrine:  Musculoskeletal:   Allergy/Immunology:   Neurological:   Psychiatric:  Sleep:   The following represents the patient's updated allergies and side effects list: No Known Allergies  The neurologically  relevant items on the patient's problem list were reviewed on today's visit.  Neurologic Examination  A problem focused neurological exam (12 or more points of the single system neurologic examination, vital signs counts as 1 point, cranial nerves count for 8 points) was performed.  Blood pressure 176/82, pulse 75, height 5\' 8"  (1.727 m), weight 141 lb 3.2 oz (64.048 kg).  General - Well nourished, well developed, in no apparent distress.  Ophthalmologic - Sharp disc margins OU.   Cardiovascular - Regular rate and rhythm with no murmur.  Mental Status -  Level of arousal and orientation to time, place, and person were intact. Language including expression, naming, repetition, comprehension was assessed and found intact. Fund of Knowledge was assessed and was intact  Cranial Nerves II - XII - II - Visual field intact OU. III, IV, VI - Extraocular movements intact. V - Facial sensation intact bilaterally. VII - Facial movement intact bilaterally. VIII - Hearing & vestibular intact bilaterally. X - Palate elevates symmetrically. XI - Chin turning & shoulder shrug intact bilaterally. XII - Tongue protrusion intact.  Motor Strength - The patient's strength was normal in all extremities and pronator drift was absent.  Bulk was normal and fasciculations were absent.   Motor Tone - Muscle tone was assessed at the neck and appendages and was normal  Reflexes - The patient's reflexes were 1+ in all extremities and he had no pathological reflexes.  Sensory - Light touch, temperature/pinprick were assessed and were normal.    Coordination - The patient had normal movements in  the hands and feet with no ataxia or dysmetria.  Tremor was absent.  Gait and Station - The patient's transfers, posture, gait, station, and turns were observed as normal.   Functional score  mRS = 0   0 - No symptoms.   1 - No significant disability. Able to carry out all usual activities, despite some  symptoms.   2 - Slight disability. Able to look after own affairs without assistance, but unable to carry out all previous activities.   3 - Moderate disability. Requires some help, but able to walk unassisted.   4 - Moderately severe disability. Unable to attend to own bodily needs without assistance, and unable to walk unassisted.   5 - Severe disability. Requires constant nursing care and attention, bedridden, incontinent.   6 - Dead.   NIH Stroke Scale = 0   Data reviewed: I personally reviewed the images and agree with the radiology interpretations.  Ct Head Wo Contrast 01/26/2016 1. Slight interval decrease in conspicuity of 5 mm subependymal hemorrhage centered at the left caudate body. 2. Interval decrease in small volume acute left parafalcine subdural hematoma. No mass effect. 3. No new intracranial process. 4. Evolving left posterior scalp contusion.  01/25/2016 1. Tiny interhemispheric subdural hematoma again noted on the left side, near the convexity, relatively stable in appearance. 2. Apparent small 5 mm subependymal hemorrhage again noted at the left caudate body. 3. Mild cortical volume loss and scattered small vessel ischemic microangiopathy. 4. Soft tissue swelling overlying the left occiput.  01/24/2016 1. Tiny interhemispheric subdural hematoma without mass effect. 2. 8 mm subependymal hemorrhage at the left caudate body. 3. Posterior scalp hematoma without calvarial fracture.   Ct Cervical Spine Wo Contrast 01/24/2016 Negative for cervical spine fracture.   Mr Brain Ltd W/o Cm 01/25/2016 1. Grossly stable subcentimeter subependymal hemorrhage centered at the left caudate body as well as tiny left parafalcine subdural hematoma. These are better evaluated prior CT from earlier the same day. 2. Tiny 4 mm foci of restricted diffusion within the right periatrial white matter, consistent with acute small vessel ischemia. 3. Left parieto-occipital scalp contusion. 4.  Two tiny remote left cerebellar infarcts. 5. Age-related cerebral atrophy with mild chronic small vessel ischemic disease.   Carotid Doppler  There is 1-39% bilateral ICA stenosis. Vertebral artery flow is antegrade.   TTE  - Left ventricle: The cavity size was normal. Wall thickness was normal. Systolic function was normal. The estimated ejection fraction was in the range of 55% to 60%. Wall motion was normal; there were no regional wall motion abnormalities. Doppler parameters are consistent with abnormal left ventricular relaxation (grade 1 diastolic dysfunction). Impressions: Normal LV systolic function; grade 1 diastolic dysfunction; trace MR and TR.  EEG - This is a normal drowsy and asleep EEG for the patients stated age. There were no focal, hemispheric or lateralizing features. No epileptiform activity was recorded. A normal EEG does not exclude the diagnosis of a seizure disorder   Component     Latest Ref Rng 01/25/2016  Cholesterol     0 - 200 mg/dL 203 (H)  Triglycerides     <150 mg/dL 54  HDL Cholesterol     >40 mg/dL 50  Total CHOL/HDL Ratio      4.1  VLDL     0 - 40 mg/dL 11  LDL (calc)     0 - 99 mg/dL 142 (H)  Hemoglobin A1C     4.8 - 5.6 % 6.3 (H)  Mean Plasma Glucose      134  TSH     0.350 - 4.500 uIU/mL 2.269  Vitamin B12     180 - 914 pg/mL 311  Folate     >5.9 ng/mL 14.3    Assessment: As you may recall, he is a 82 y.o. Caucasian male with PMH of hypertension who fell and ? passed out ? loss of consciousness at home on 01/24/16 with resultant confusion and scalp laceration. CT of the head showed tiny left caudate body hemorrhage and tiny left parafalcine SDH, concerning for trauma related. MRI showed incidental DWI restriction adjacent to right occipital horn and two old tiny left cerebellar infarcts, likely small vessel disease. The 2nd day on admission, pt developed severe BPPV with positive dix-halpike test. Had PT/OT and maneuver training, pt  condition gradually improved. EEG normal, repeat CT no significant change. Home ASA 81 discontinued. EF 55-60% and CUS negative. LDL 142 and A1C 6.3. He had home PT and outpt PT for BPPV and now resolved. He refused CT repeat after discharge. He is on crestor 10mg . Will resume ASA 81mg .    Plan:  - restart ASA 81mg  daily for stroke prevention - continue crestor for HLD and stroke prevention - Follow up with your primary care physician for stroke risk factor modification. Recommend maintain blood pressure goal <130/80, diabetes with hemoglobin A1c goal below 6.5% and lipids with LDL cholesterol goal below 70 mg/dL.  - check BP at home and record. - if BPPV happens, please repeat the maneuver to help the vertigo.  - follow up in 6 months.  I spent more than 25 minutes of face to face time with the patient. Greater than 50% of time was spent in counseling and coordination of care. We discussed stroke prevention and BPPV management.   No orders of the defined types were placed in this encounter.    Meds ordered this encounter  Medications  . rosuvastatin (CRESTOR) 10 MG tablet    Sig:   . aspirin EC 81 MG tablet    Sig: Take 1 tablet (81 mg total) by mouth daily.    Dispense:  30 tablet    Refill:  11    Patient Instructions  - restart ASA 81mg  daily for stroke prevention - continue crestor for HLD and stroke prevention - Follow up with your primary care physician for stroke risk factor modification. Recommend maintain blood pressure goal <130/80, diabetes with hemoglobin A1c goal below 6.5% and lipids with LDL cholesterol goal below 70 mg/dL.  - check BP at home and record. - if BPPV happens, please repeat the maneuver to help the vertigo.  - follow up in 6 months.    Rosalin Hawking, MD PhD [facility] [address] [place], [place] [address] [phone]

## 2016-03-24 ENCOUNTER — Ambulatory Visit: Payer: Medicare Other | Admitting: Physical Therapy

## 2016-03-24 DIAGNOSIS — H8112 Benign paroxysmal vertigo, left ear: Secondary | ICD-10-CM | POA: Diagnosis not present

## 2016-03-24 DIAGNOSIS — R42 Dizziness and giddiness: Secondary | ICD-10-CM

## 2016-03-24 NOTE — Patient Instructions (Signed)
Gaze Stabilization: Standing Feet Apart   Do this exercise with your glasses on. Place your visual target ("A") on the wall at eye-level. Feet shoulder width apart, keeping eyes on target on wall 3 feet away, tilt head down slightly and move head side to side for 30 seconds.  Do 2 sessions per day.  Gaze Stabilization: Tip Card 1.Target must remain in focus, not blurry, and appear stationary while head is in motion. 2.Perform exercises with small head movements (45 to either side of midline). 3.Increase speed of head motion so long as target is in focus.   [name], you may stop doing the below exercises below, as you haven't experienced symptoms with the exercises for several days. As we discussed, you may revisit the below exercises occasionally. If exercises provoke symptoms, begin performing exercises daily again until you experience no symptoms for 3 consecutive days. Tip Card 1.The goal of habituation training is to assist in decreasing symptoms of vertigo, dizziness, or nausea provoked by specific head and body motions. 2.These exercises may initially increase symptoms; however, be persistent and work through symptoms. With repetition and time, the exercises will assist in reducing or eliminating symptoms. 3.Exercises should be stopped and discussed with the therapist if you experience any of the following: - Sudden change or fluctuation in hearing - New onset of ringing in the ears, or increase in current intensity - Any fluid discharge from the ear - Severe pain in neck or back - Extreme nausea  Copyright  VHI. All rights reserved.  Rolling   With pillow under head, start on back. Roll to your right side.  Hold until dizziness stops, plus 20 seconds and then roll to the left side.  Hold until dizziness stops, plus 20 seconds.  Repeat sequence 5 times per session. Do 2 sessions per day.  Copyright  VHI. All rights reserved.  Sit to Side-Lying   Sit on edge of bed. Lie down onto  the right side and hold until dizziness stops, plus 20 seconds.  Return to sitting and wait until dizziness stops, plus 20 seconds.  Repeat to the left side. Repeat sequence 5 times per session. Do 2 sessions per day.

## 2016-03-24 NOTE — Therapy (Signed)
[facility] [address] [place], [place], [address] Phone: [phone]   Fax:  [phone]  Physical Therapy Treatment and Discharge Summary  Patient Details  Name: Casey Lee MRN: [id number] Date of Birth: 1927-12-10 Referring Provider: Rosalin Hawking, MD  Encounter Date: 03/24/2016      PT End of Session - 03/24/16 1316    Visit Number 8   Number of Visits 9   Date for PT Re-Evaluation 04/05/16   Authorization Type Medicare Traditional primary; BCBS secondary   Authorization Time Period G Codes required   PT Start Time 0804   PT Stop Time 0845   PT Time Calculation (min) 41 min   Activity Tolerance Patient tolerated treatment well   Behavior During Therapy [facility] for tasks assessed/performed      Past Medical History  Diagnosis Date  . High cholesterol   . Hearing loss   . UTI (lower urinary tract infection)   . Vertigo     Past Surgical History  Procedure Laterality Date  . None      There were no vitals filed for this visit.      Subjective Assessment - 03/24/16 0805    Subjective "I'm 95% normal. I feel a little bit of it (dizziness) first thing in the morning when I get going, but that's all."   Pertinent History Likes to be called "[name]".  PMH significant for: HTN, HLD, UTI, hearing loss   Diagnostic tests Head CT (4/24): two small hemorrhagic spots, one ICH and one SDH; two tiny remote left cerebellar infarcts; chronic small vessel disease.   Patient Stated Goals "To get rid of the vertigo."   Currently in Pain? No/denies                Vestibular Assessment - 03/24/16 0001    Sidelying Right   Sidelying Right Duration NA  Asymptomatic   Sidelying Right Symptoms No nystagmus   Sidelying Left   Sidelying Left Duration NA  Asymptomatic   Sidelying Left Symptoms No nystagmus   Horizontal Canal Right   Horizontal Canal Right Duration NA  Asymptomatic   Horizontal Canal Right  Symptoms Normal   Horizontal Canal Left   Horizontal Canal Left Duration NA  Asymptomatic   Horizontal Canal Left Symptoms Normal                 [facility] Adult PT Treatment/Exercise - 03/24/16 0001    Ambulation/Gait   Ambulation/Gait Yes   Ambulation/Gait Assistance 6: Modified independent (Device/Increase time)   Ambulation Distance (Feet) 500 Feet   Assistive device None   Gait Pattern Within Functional Limits   Ambulation Surface Level;Unlevel;Indoor;Outdoor;Paved         Vestibular Treatment/Exercise - 03/24/16 0001    Vestibular Treatment/Exercise   Vestibular Treatment Provided Habituation;Gaze   Habituation Exercises Casey Lee Daroff;Horizontal Roll   Gaze Exercises X1 Viewing Horizontal   Casey Lee   Number of Reps  2   Symptom Description  Reviewed habituation home exercise x2 reps with no symptoms.   Horizontal Roll   Number of Reps  2   Symptom Description  Reviewed habituation home exercise x2 reps with no symptoms.   X1 Viewing Horizontal   Foot Position standing; shoulder width   Reps 2   Comments Reviewed exercise 2 x30-second trials               PT Education - 03/24/16 1314    Education provided Yes   Education Details  PT goals, findings, progress, and DC plan. Reviewed gaze stabilization HEP to continue post-DC from PT.   Person(s) Educated Patient   Methods Explanation;Demonstration;Verbal cues;Handout   Comprehension Verbalized understanding;Returned demonstration          PT Short Term Goals - 02/05/16 1310    PT SHORT TERM GOAL #1   Title STG's = LTG's           PT Long Term Goals - 04/15/16 7902    PT LONG TERM GOAL #1   Title Positional vertigo testing will be negative to indicate resolved BPPV.    (Target date: 03/04/16)   Baseline Met 2023-04-16.   Status Achieved   PT LONG TERM GOAL #2   Title Pt will improve DHI score from 54 to 36 36 to indicate significant decrease in pt-perceived disability due to dizziness.     Baseline 04-16-2023: DHI score = 2   Status Achieved   PT LONG TERM GOAL #3   Title Assess strength and balance, if indicated, after vertigo clears.    Baseline Met 2023-04-16 with no marked impairments in balance or gait stability.   Status Achieved   PT LONG TERM GOAL #4   Title Pt will ambulate x200' over level, indoor surfaces with mod I using LRAD to indicate safety with household mobility.     Baseline Met 2023-04-16.   Status Achieved   PT LONG TERM GOAL #5   Title Pt will ambulate 300' over unlevel, paved surfaces with mod I using LRAD to indicate safety with short-distance community mobility.    Baseline Met Apr 16, 2023.   Status Achieved   PT LONG TERM GOAL #6   Title Pt will perform HEP with mod I using paper handout to maximize functional gains made in PT.    Baseline Met 2023/04/16.   Status Achieved               Plan - 2016/04/15 1446    Clinical Impression Statement Pt has met all LTG's and BPPV cleared. Therefore, patient will be discharged from outpatient PT at this time. Pt in full agreement with DC.   Consulted and Agree with Plan of Care Patient      Patient will benefit from skilled therapeutic intervention in order to improve the following deficits and impairments:     Visit Diagnosis: Dizziness and giddiness       G-Codes - Apr 15, 2016 1318    Functional Assessment Tool Used DHI = 2   Functional Limitation Self care   Self Care Goal Status ([id number]) At least 20 percent but less than 40 percent impaired, limited or restricted   Self Care Discharge Status [phone]) At least 1 percent but less than 20 percent impaired, limited or restricted      Problem List Patient Active Problem List   Diagnosis Date Noted  . HLD (hyperlipidemia) 03/19/2016  . SDH (subdural hematoma) ([place]) 01/28/2016  . BPPV (benign paroxysmal positional vertigo) 01/28/2016  . Cerebral infarction [facility]) - incidental, punctate R periventricular infarct 01/28/2016  . Essential hypertension 01/28/2016  .  Hyperlipidemia LDL goal <70 01/28/2016  . Acute encephalopathy   . Traumatic hemorrhage of cerebrum with loss of consciousness ([place]) - tiny L caudate 01/24/2016   PHYSICAL THERAPY DISCHARGE SUMMARY  Visits from Start of Care: 8  Current functional level related to goals / functional outcomes: See above.   Remaining deficits: Impaired VOR. Pt continues to report dizziness for initial 5 minutes after getting out of bed in morning. BPPV testing  negative and pt politely declined this therapists recommendation to assess orthostatic vital signs.   Education / Equipment: Education on BPPV, HEP and progression. Plan: Patient agrees to discharge.  Patient goals were met. Patient is being discharged due to meeting the stated rehab goals.  ?????        Namon Ruddy, PT, DPT [facility] [address] [place], [place], [address] Phone: [phone]   Fax:  [phone] 03/24/2016, 2:52 PM   Name: Casey Lee MRN: [id number] Date of Birth: September 11, 1928

## 2016-04-14 ENCOUNTER — Other Ambulatory Visit: Payer: Self-pay | Admitting: General Surgery

## 2016-05-12 DIAGNOSIS — E789 Disorder of lipoprotein metabolism, unspecified: Secondary | ICD-10-CM | POA: Diagnosis not present

## 2016-05-12 DIAGNOSIS — R51 Headache: Secondary | ICD-10-CM | POA: Diagnosis not present

## 2016-05-17 DIAGNOSIS — E789 Disorder of lipoprotein metabolism, unspecified: Secondary | ICD-10-CM | POA: Diagnosis not present

## 2016-05-17 DIAGNOSIS — R079 Chest pain, unspecified: Secondary | ICD-10-CM | POA: Diagnosis not present

## 2016-05-17 DIAGNOSIS — R0781 Pleurodynia: Secondary | ICD-10-CM | POA: Diagnosis not present

## 2016-05-17 DIAGNOSIS — L309 Dermatitis, unspecified: Secondary | ICD-10-CM | POA: Diagnosis not present

## 2016-06-23 DIAGNOSIS — Z23 Encounter for immunization: Secondary | ICD-10-CM | POA: Diagnosis not present

## 2016-07-01 DIAGNOSIS — L570 Actinic keratosis: Secondary | ICD-10-CM | POA: Diagnosis not present

## 2016-07-01 DIAGNOSIS — R21 Rash and other nonspecific skin eruption: Secondary | ICD-10-CM | POA: Diagnosis not present

## 2016-07-01 DIAGNOSIS — S40862A Insect bite (nonvenomous) of left upper arm, initial encounter: Secondary | ICD-10-CM | POA: Diagnosis not present

## 2016-07-01 DIAGNOSIS — L821 Other seborrheic keratosis: Secondary | ICD-10-CM | POA: Diagnosis not present

## 2016-09-07 ENCOUNTER — Other Ambulatory Visit: Payer: Self-pay | Admitting: Endocrinology

## 2016-09-07 DIAGNOSIS — R29898 Other symptoms and signs involving the musculoskeletal system: Secondary | ICD-10-CM

## 2016-09-07 DIAGNOSIS — E789 Disorder of lipoprotein metabolism, unspecified: Secondary | ICD-10-CM | POA: Diagnosis not present

## 2016-09-07 DIAGNOSIS — M6281 Muscle weakness (generalized): Secondary | ICD-10-CM | POA: Diagnosis not present

## 2016-09-07 DIAGNOSIS — I639 Cerebral infarction, unspecified: Secondary | ICD-10-CM | POA: Diagnosis not present

## 2016-09-08 ENCOUNTER — Other Ambulatory Visit: Payer: Self-pay | Admitting: Endocrinology

## 2016-09-08 ENCOUNTER — Ambulatory Visit
Admission: RE | Admit: 2016-09-08 | Discharge: 2016-09-08 | Disposition: A | Discharge status: Home / Self Care | Payer: Medicare Other | Source: Ambulatory Visit | Attending: Endocrinology | Admitting: Endocrinology

## 2016-09-08 DIAGNOSIS — E789 Disorder of lipoprotein metabolism, unspecified: Secondary | ICD-10-CM | POA: Diagnosis not present

## 2016-09-08 DIAGNOSIS — R29898 Other symptoms and signs involving the musculoskeletal system: Secondary | ICD-10-CM

## 2016-09-08 DIAGNOSIS — G8194 Hemiplegia, unspecified affecting left nondominant side: Secondary | ICD-10-CM | POA: Diagnosis not present

## 2016-09-08 MED ORDER — IOPAMIDOL (ISOVUE-300) INJECTION 61%
75.0000 mL | Freq: Once | INTRAVENOUS | Status: AC | PRN
  Administered 2016-09-08: 75 mL via INTRAVENOUS

## 2016-09-14 ENCOUNTER — Other Ambulatory Visit: Payer: Self-pay | Admitting: *Deleted

## 2016-09-14 ENCOUNTER — Encounter: Payer: Self-pay | Admitting: *Deleted

## 2016-09-16 ENCOUNTER — Ambulatory Visit: Payer: Medicare Other | Admitting: Neurology

## 2016-09-27 ENCOUNTER — Ambulatory Visit (INDEPENDENT_AMBULATORY_CARE_PROVIDER_SITE_OTHER): Payer: Medicare Other | Admitting: Neurology

## 2016-09-27 ENCOUNTER — Encounter: Payer: Self-pay | Admitting: Neurology

## 2016-09-27 VITALS — BP 130/90 | HR 82 | Ht 68.0 in | Wt 144.4 lb

## 2016-09-27 DIAGNOSIS — I639 Cerebral infarction, unspecified: Secondary | ICD-10-CM

## 2016-09-27 NOTE — Progress Notes (Signed)
[facility] Follow-up visit   Date: 09/27/16   Casey Lee MRN: [id number] DOB: 01/20/1928    History of Present Illness: Casey Lee is a 82 y.o. right-handed Caucasian male who previously saw me for left facial paresthesias is referred for left hand weakness and loss of control.   Initial visit 12/05/2014:   He was in very good health and last sought medical care last in 1993, however in September 2015, he developed a rash involving his hands so went to see his PCP.  Rash was treated with antibiotics and prednisone taper in December which cleared up his rash.  Around the same time, he developed intermittent spells of numbness involving the left side of the face.  He thought it was due to statin medication, so stopped it but there was no change in his facial numbness.  The numbness occurs about 1-2 per week without any triggers. There is no tingling, pain, weakness, of drooping of the face. In late February, he developed left hearing loss.  There is no ear pain, drainage, or recent illness.  No problems with balance, vision problems, headaches, or temperal artery tenderness.  Denies jaw claudication.  Last eye evaluation was two years ago.  He has not had audiogram.  He has been seeing his PCP for the above complaints and had ESR checked which returned normal x 3. Due to concern of giant cell arteritis, he was started on prednisone taper and has noticed hearing starting to improve.    UPDATE 09/27/2016:   Notable interval history includes a hospitalization in April for loss of consciousness at home on 01/24/16 with resultant confusion and scalp laceration. CT of the head showed tiny left caudate body hemorrhage and tiny left parafalcine SDH, concerning for trauma related. MRI showed incidental DWI restriction adjacent to right occipital horn and two old tiny left cerebellar infarcts, likely small vessel disease. The 2nd day on admission, pt developed severe BPPV  with positive dix-halpike test. It was thought that his fall was most likely to vertigo with resultant ICH and tiny subdural hematoma.  EEG normal, repeat CT no significant change. His home aspirin therapy was held for 3 months and recently started in June. Stroke workup otherwise was negative.   On December 1st 2017, he developed left hand incoordination which lasted one week.  There was no numbness/tingling, facial droop, or left leg symptoms.  He recognized this because of difficulty manipulating his buttons and using a computer.  He had CT brain which did not show any acute changes, although there is a chronic right basal ganglia ischemic stroke.  Since onset, his symptoms have returned to baseline.  His aspirin therapy was switched to Plavix 70m daily.   He denies any new neurological symptoms.    Current Outpatient Prescriptions on File Prior to Visit  Medication Sig  . aspirin EC 81 MG tablet Take 1 tablet (81 mg total) by mouth daily. (Patient not taking: Reported on 09/27/2016)  . diphenhydrAMINE (BENADRYL) 25 mg capsule Take 25 mg by mouth every 6 (six) hours as needed for allergies.  . hydrocortisone 2.5 % lotion Apply to scalp and face up to twice a day as needed for itching  . loratadine (CLARITIN) 10 MG tablet Take 10 mg by mouth daily as needed for allergies.  . rosuvastatin (CRESTOR) 10 MG tablet   . tamsulosin (FLOMAX) 0.4 MG CAPS capsule Take by mouth.  . triamcinolone cream (KENALOG) 0.1 % Apply to itchy rash on body as  needed up to twice a day   No current facility-administered medications on file prior to visit.      Review of Systems:  CONSTITUTIONAL: No fevers, chills, night sweats, or weight loss.   EYES: No visual changes or eye pain ENT: +hearing changes.  No history of nose bleeds.   RESPIRATORY: No cough, wheezing and shortness of breath.   CARDIOVASCULAR: Negative for chest pain, and palpitations.   GI: Negative for abdominal discomfort, blood in stools or black  stools.  No recent change in bowel habits.   GU:  No history of incontinence.   MUSCLOSKELETAL: No history of joint pain or swelling.  No myalgias.   SKIN: Negative for lesions, rash, and itching.   HEMATOLOGY/ONCOLOGY: Negative for prolonged bleeding, bruising easily, and swollen nodes.  No history of cancer.   ENDOCRINE: Negative for cold or heat intolerance, polydipsia or goiter.   PSYCH:  No depression or anxiety symptoms.   NEURO: As Above.   Vital Signs:  BP 130/90   Pulse 82   Ht 5' 8"  (1.727 m)   Wt 144 lb 7 oz (65.5 kg)   SpO2 98%   BMI 21.96 kg/m    General Medical Exam:   General:  Well appearing, comfortable.   Respiratory:  Clear to auscultation, good air entry bilaterally.   Cardiac:  Regular rate and rhythm, no murmur.    Neurological Exam: MENTAL STATUS including orientation to time, place, person, recent and remote memory, attention span and concentration, language, and fund of knowledge is normal.  Speech is not dysarthric.  CRANIAL NERVES:  No visual field deficits.  Pupils are round and reactive to light. Mild esotropia on the left, no correction with cover test. Normal conjugate, extra-ocular eye movements in all directions of gaze.  Facial sensation is intact. Face is symmetric. Palate elevates symmetrically and tongue is midline.   MOTOR:  No atrophy, fasciculations or abnormal movements.  No pronator drift.  Tone is normal.    Right Upper Extremity:    Left Upper Extremity:    Deltoid  5/5   Deltoid  5/5   Biceps  5/5   Biceps  5/5   Triceps  5/5   Triceps  5/5   Wrist extensors  5/5   Wrist extensors  5/5   Wrist flexors  5/5   Wrist flexors  5/5   Finger extensors  5/5   Finger extensors  5/5   Finger flexors  5/5   Finger flexors  5/5   Dorsal interossei  5/5   Dorsal interossei  5/5   Abductor pollicis  5/5   Abductor pollicis  5/5   Tone (Ashworth scale)  0  Tone (Ashworth scale)  0   Right Lower Extremity:    Left Lower Extremity:    Hip  flexors  5/5   Hip flexors  5/5   Hip extensors  5/5   Hip extensors  5/5   Knee flexors  5/5   Knee flexors  5/5   Knee extensors  5/5   Knee extensors  5/5   Dorsiflexors  5/5   Dorsiflexors  5/5   Plantarflexors  5/5   Plantarflexors  5/5   Toe extensors  5/5   Toe extensors  5/5   Toe flexors  5/5   Toe flexors  5/5   Tone (Ashworth scale)  0  Tone (Ashworth scale)  0   MSRs:  Right  Left brachioradialis 2+  brachioradialis 2+  biceps 2+  biceps 2+  triceps 2+  triceps 2+  patellar 2+  patellar 2+  ankle jerk 1+  ankle jerk 1+  Hoffman no  Hoffman no  plantar response down  plantar response down   SENSORY:  Normal and symmetric perception of light touch, pinprick, vibration, and proprioception.  Romberg's sign absent.   COORDINATION/GAIT: Normal finger-to- nose-finger and heel-to-shin.  Intact rapid alternating movements bilaterally.  Able to rise from a chair without using arms.  Gait narrow based and stable.   DATA: MRI brain 01/25/2016: 1. Grossly stable subcentimeter subependymal hemorrhage centered at the left caudate body as well as tiny left parafalcine subdural hematoma. These are better evaluated prior CT from earlier the same day. 2. Tiny 4 mm foci of restricted diffusion within the right periatrial white matter, consistent with acute small vessel ischemia. 3. Left parieto-occipital scalp contusion. 4. Two tiny remote left cerebellar infarcts. 5. Age-related cerebral atrophy with mild chronic small vessel ischemic disease.  CT head 09/08/2016: Generalized atrophy has progressed since earlier this year. Chronic microvascular ischemic change.  No acute abnormality.  Of note, there is an chronic lacunar infarction in the right internal capsule which is not visualized on the prior CT or MRI.  Labs 09/08/2016: ESR 6, CBC within normal limits, sodium 139 potassium 4.8 glucose 75 creatinine 1.4*, LFTs within  normal limits  Lab Results  Component Value Date   CHOL 203 (H) 01/25/2016   HDL 50 01/25/2016   LDLCALC 142 (H) 01/25/2016   TRIG 54 01/25/2016   CHOLHDL 4.1 01/25/2016    IMPRESSION/PLAN: Right lacunar stroke manifesting with pure motor weakness (mild), most likely due to small vessel disease. CT brain was personally reviewed with patient which does show chronic right lacunar hypointensity, which was previously not present on his CT scan from April 2017. Fortunately, he has recovered completely from his event and there is no residual weakness. He was previously on aspirin and has been switched to Plavix 75 mg daily, which will be continued. He is also on Crestor 10 mg daily. He is normotensive and does not have history of tobacco use or diabetes. Review of his previous carotid ultrasound from April 2017 showed 1-39% ICA stenosis bilaterally. Prior echocardiogram was also within normal limits. I would like for him to have extended cardiac monitoring for arrhythmias, however, patient does not wish to have any additional testing at this time. I explained that if there was evidence of atrial fibrillation, management would change and he is aware of this. In the meantime, continue secondary stroke prevention with plavix 93m daily and crestor 18mdaily.  Stroke warning signs were discussed.   The duration of this appointment visit was 30 minutes of face-to-face time with the patient.  Greater than 50% of this time was spent in counseling, explanation of diagnosis, planning of further management, and coordination of care.   Thank you for allowing me to participate in patient's care.  If I can answer any additional questions, I would be pleased to do so.    Sincerely,    Merlinda Wrubel K. PaPosey ProntoDO

## 2016-09-27 NOTE — Patient Instructions (Addendum)
1.  Continue plavix 75mg  daily 2.  If you change your mind and would like to proceed with cardiac monitoring, please call my office  Stroke is a medical emergency.  Know these warning signs of stroke. Every second counts:  Sudden numbness or weakness of the face, arm or leg, especially on one side of the body,  Sudden confusion, trouble speaking or understanding,  Sudden trouble seeing in one eye or both eyes,  Sudden trouble walking, dizziness, loss of balance or coordination,  Sudden severe headache with no known cause.  If you or someone with you has one or more of these signs, don't delay!  Immediately call [id number], or the emergency medical services (EMS) number so an ambulance (ideally with advanced life support) can be sent for you.  Also, check the time so that you will know when the symptoms first appeared. It's very important to take immediate action. Medical treatment may be available if action is taken early enough.   Return to clinic as needed

## 2016-11-14 ENCOUNTER — Ambulatory Visit: Payer: Medicare Other | Admitting: Neurology

## 2016-12-06 DIAGNOSIS — R42 Dizziness and giddiness: Secondary | ICD-10-CM | POA: Diagnosis not present

## 2016-12-06 DIAGNOSIS — E789 Disorder of lipoprotein metabolism, unspecified: Secondary | ICD-10-CM | POA: Diagnosis not present

## 2016-12-06 DIAGNOSIS — I679 Cerebrovascular disease, unspecified: Secondary | ICD-10-CM | POA: Diagnosis not present

## 2016-12-08 DIAGNOSIS — R51 Headache: Secondary | ICD-10-CM | POA: Diagnosis not present

## 2017-01-04 DIAGNOSIS — E789 Disorder of lipoprotein metabolism, unspecified: Secondary | ICD-10-CM | POA: Diagnosis not present

## 2017-01-04 DIAGNOSIS — H9319 Tinnitus, unspecified ear: Secondary | ICD-10-CM | POA: Diagnosis not present

## 2017-01-24 DIAGNOSIS — L821 Other seborrheic keratosis: Secondary | ICD-10-CM | POA: Diagnosis not present

## 2017-01-24 DIAGNOSIS — L219 Seborrheic dermatitis, unspecified: Secondary | ICD-10-CM | POA: Diagnosis not present

## 2017-01-24 DIAGNOSIS — L570 Actinic keratosis: Secondary | ICD-10-CM | POA: Diagnosis not present

## 2017-01-24 DIAGNOSIS — R21 Rash and other nonspecific skin eruption: Secondary | ICD-10-CM | POA: Diagnosis not present

## 2017-01-30 DIAGNOSIS — H9113 Presbycusis, bilateral: Secondary | ICD-10-CM | POA: Diagnosis not present

## 2017-01-30 DIAGNOSIS — H9313 Tinnitus, bilateral: Secondary | ICD-10-CM | POA: Diagnosis not present

## 2017-01-30 DIAGNOSIS — H903 Sensorineural hearing loss, bilateral: Secondary | ICD-10-CM | POA: Diagnosis not present

## 2017-03-23 DIAGNOSIS — E789 Disorder of lipoprotein metabolism, unspecified: Secondary | ICD-10-CM | POA: Diagnosis not present

## 2017-04-10 IMAGING — CR DG SHOULDER 2+V*L*
3 series · 3 of 3 positions shown · non-contrast
Comparison: None.

CLINICAL DATA: Pain all over left shoulder. No known injury.

EXAM:
LEFT SHOULDER - 2+ VIEW

[shoulder grashey]
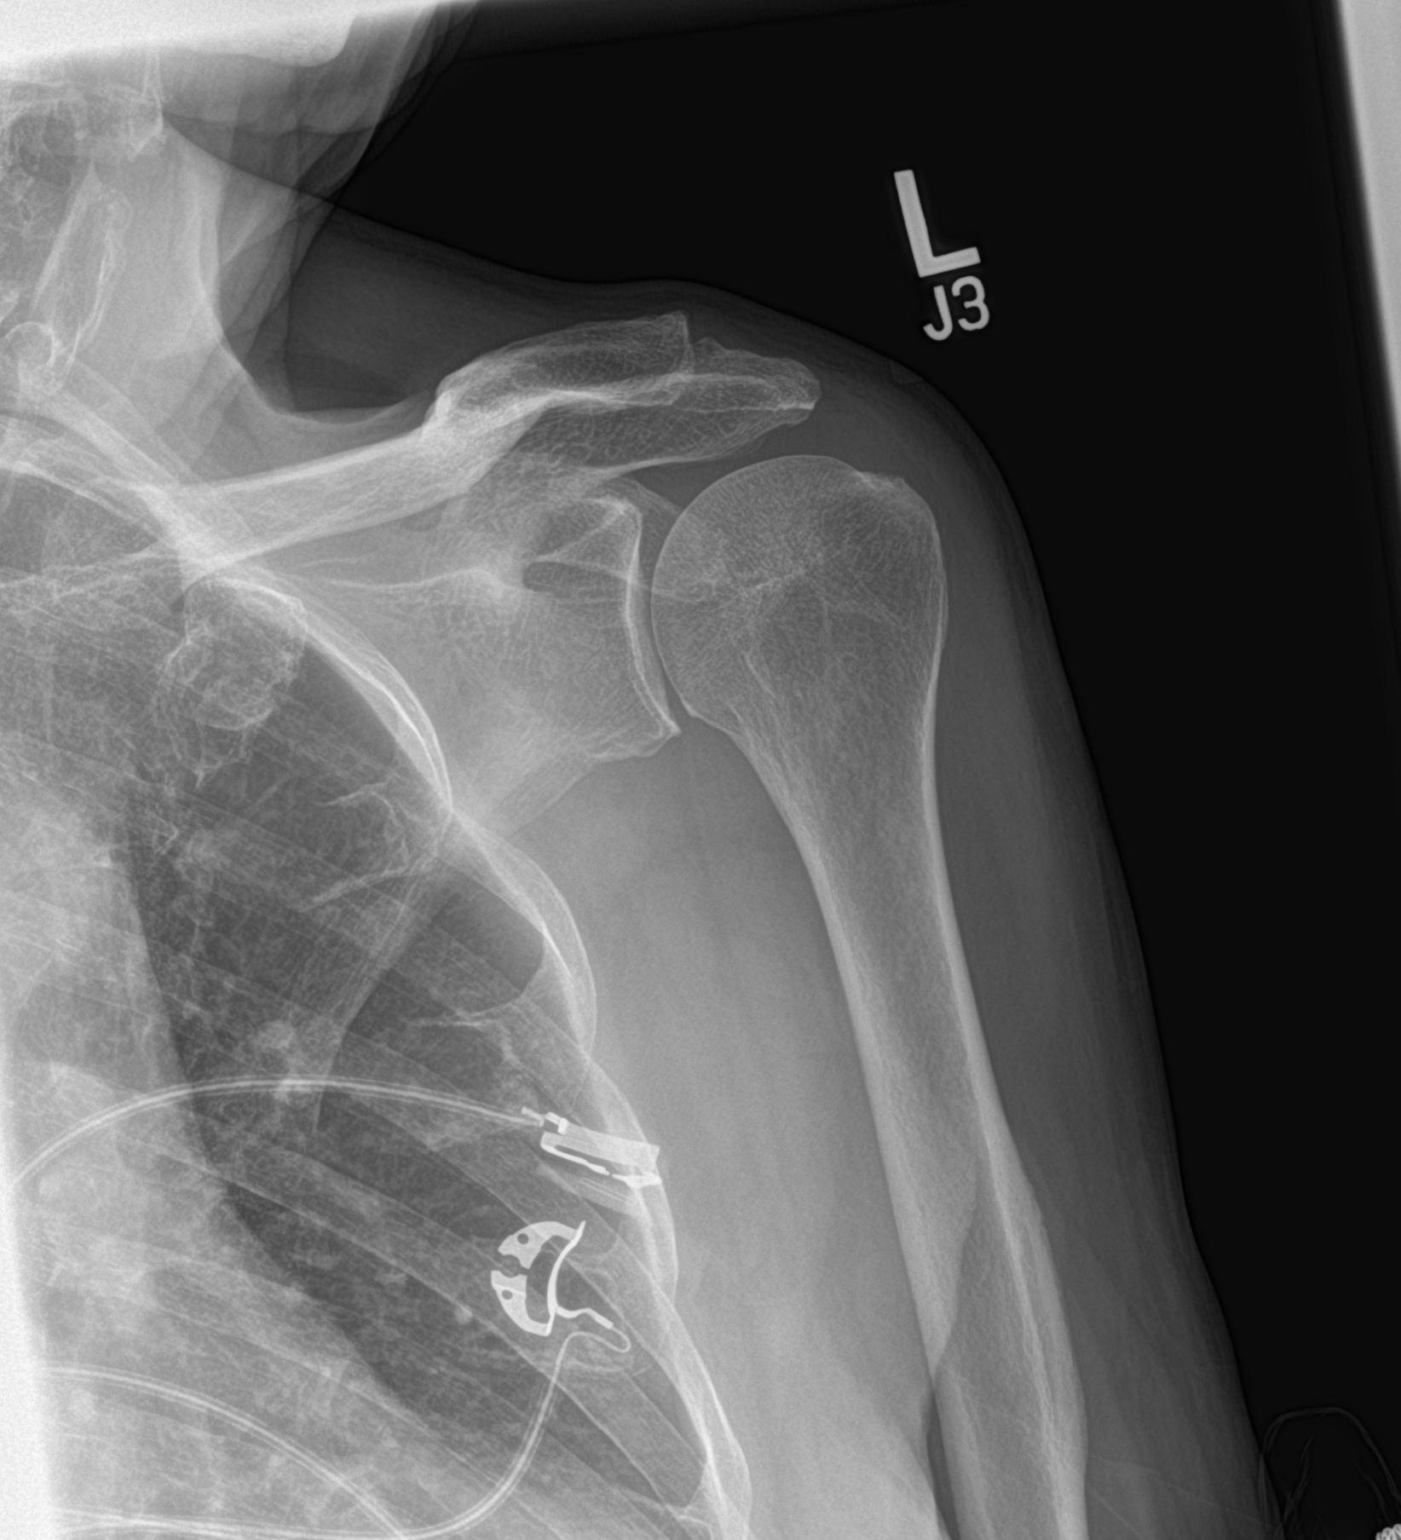

[shoulder y view]
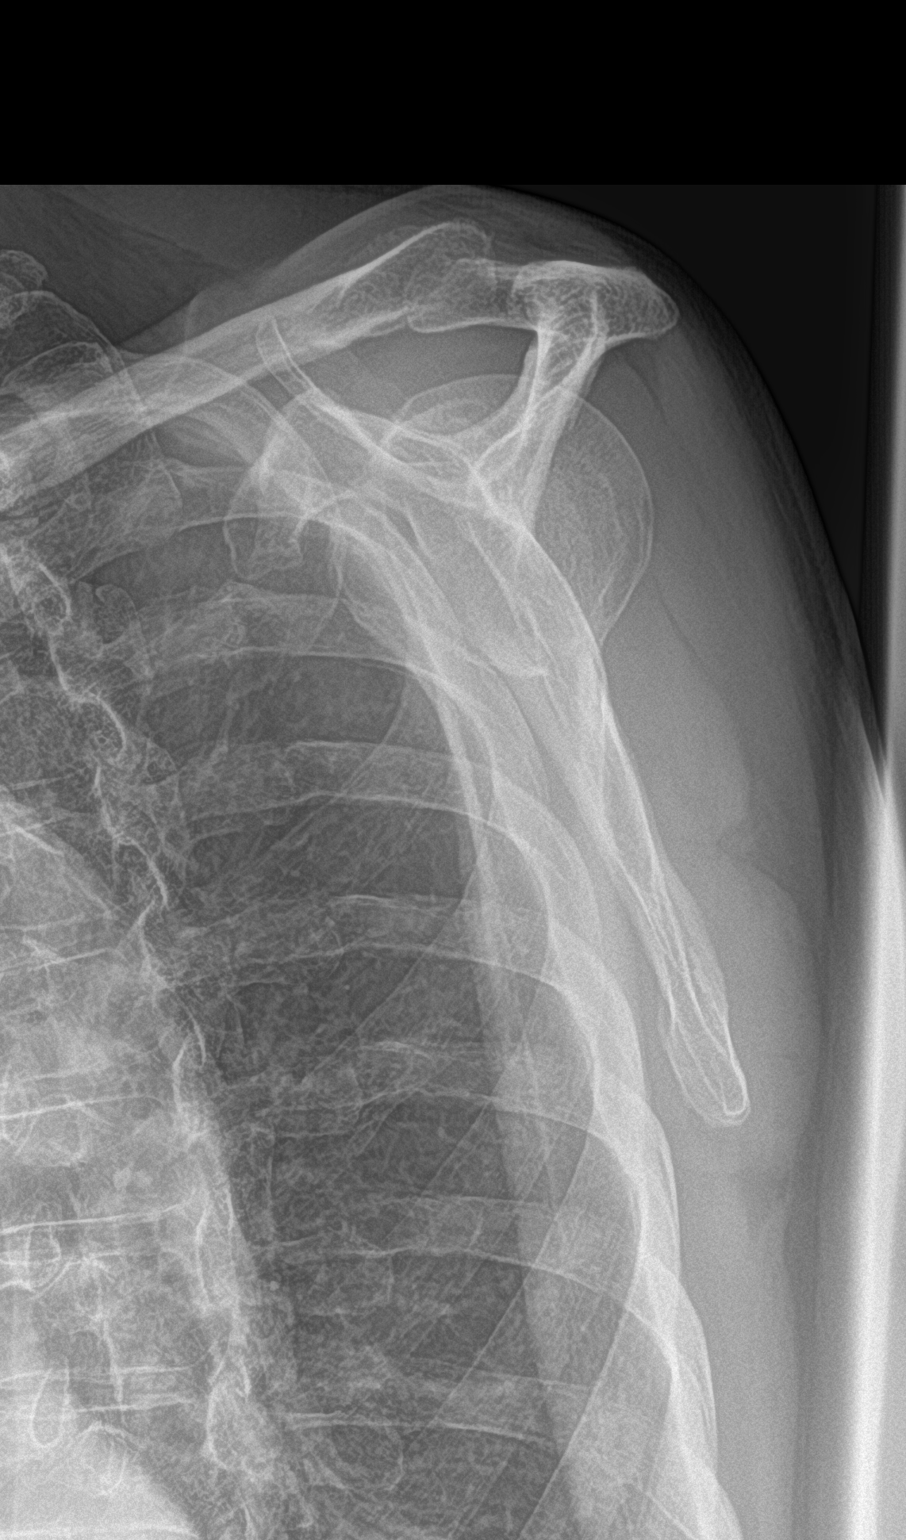

[shoulder axillary]
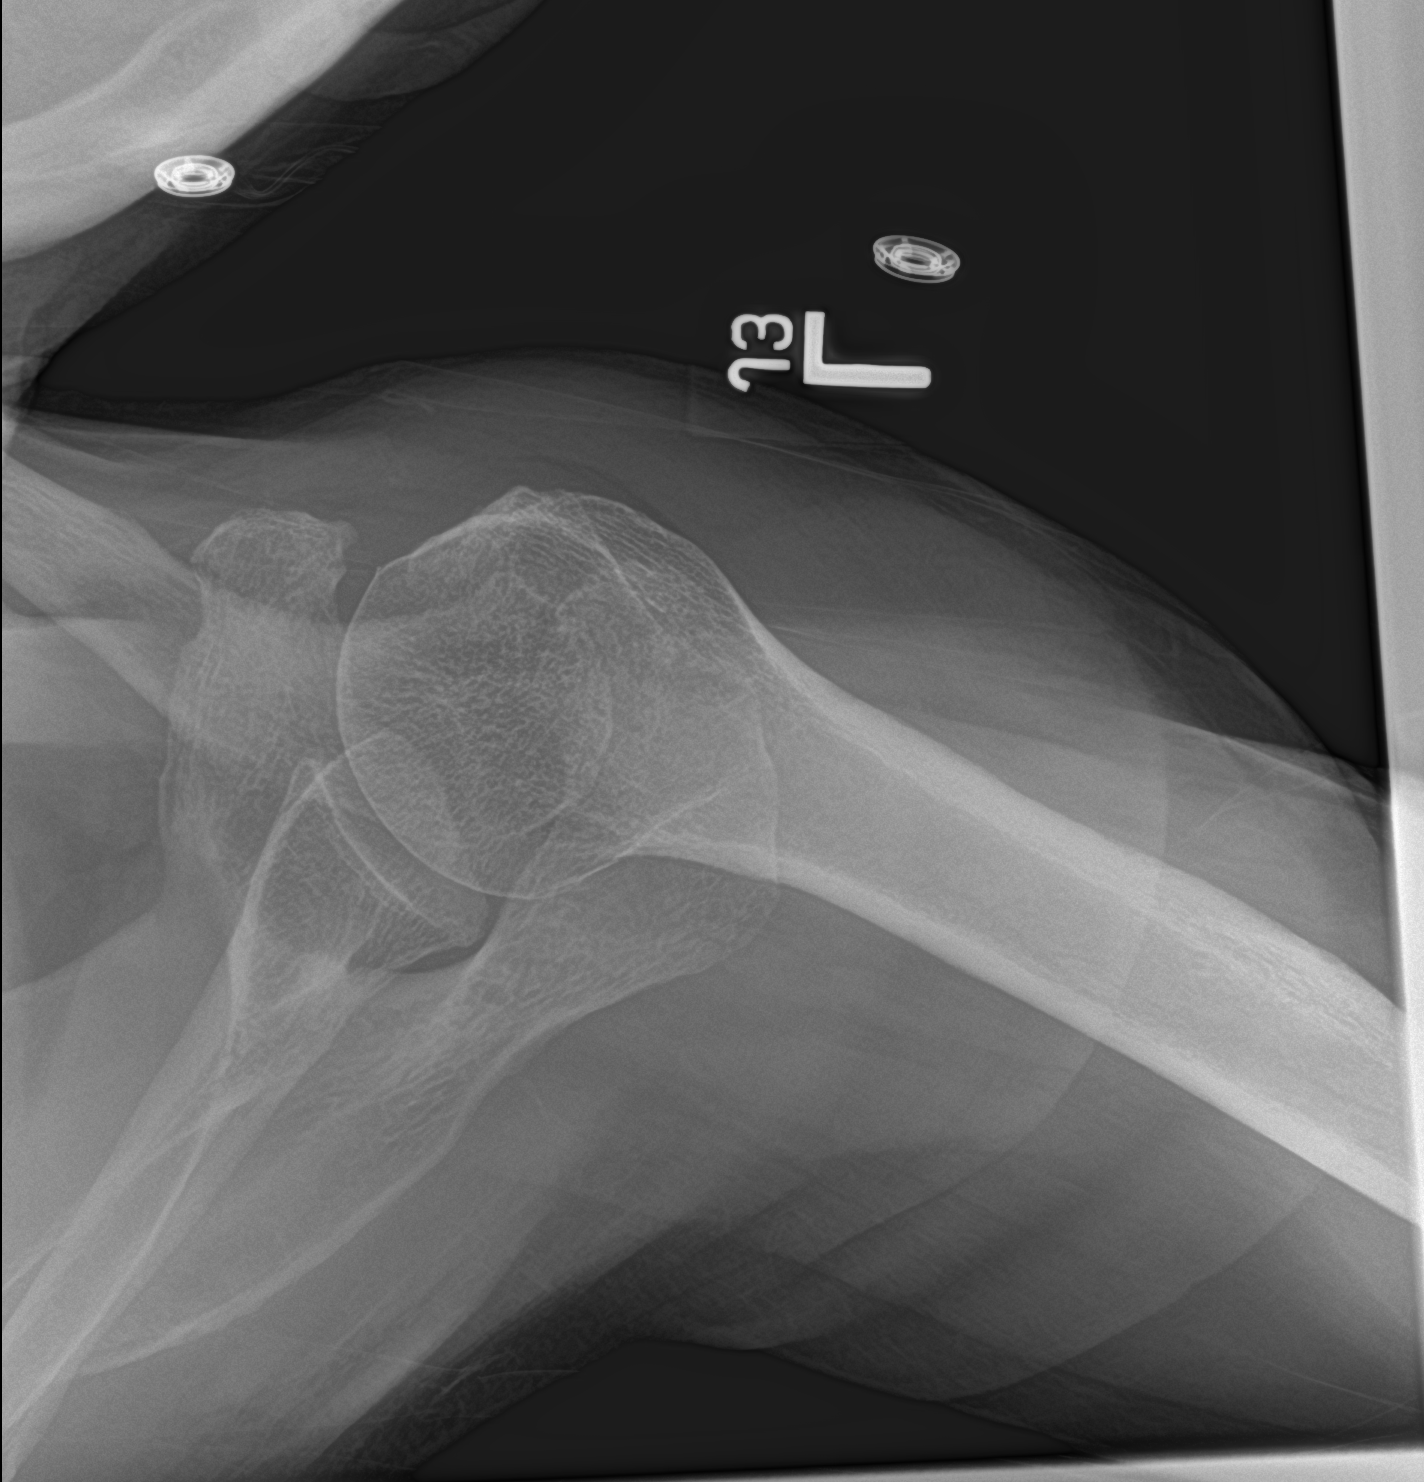

[3 of 3 positions shown; findings below may reference images not displayed]

FINDINGS: There is no evidence of fracture or dislocation. There is no
evidence of arthropathy or other focal bone abnormality. Soft
tissues are unremarkable.
IMPRESSION: Negative.

## 2017-04-10 IMAGING — MR MR HEAD W/O CM
6 series · 42 of 48 positions shown · non-contrast
Comparison: Prior CT from 01/24/2016.

CLINICAL DATA: Initial valuation for

EXAM:
MRI HEAD WITHOUT CONTRAST
TECHNIQUE: Multiplanar, multiecho pulse sequences of the brain and surrounding
structures were obtained without intravenous contrast.

[Series 3: T1 · sagittal · 5.0mm · 0.47mm/px · 4 of 25 slices shown]
[im 1/25]
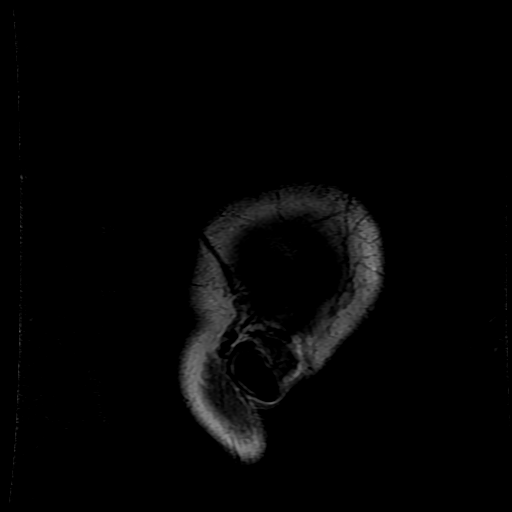
[im 9/25]
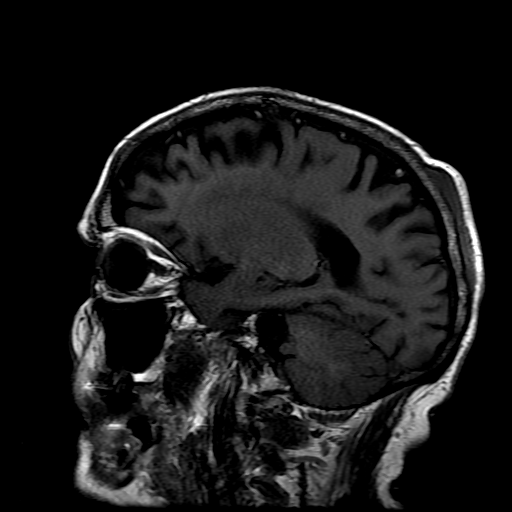
[im 17/25]
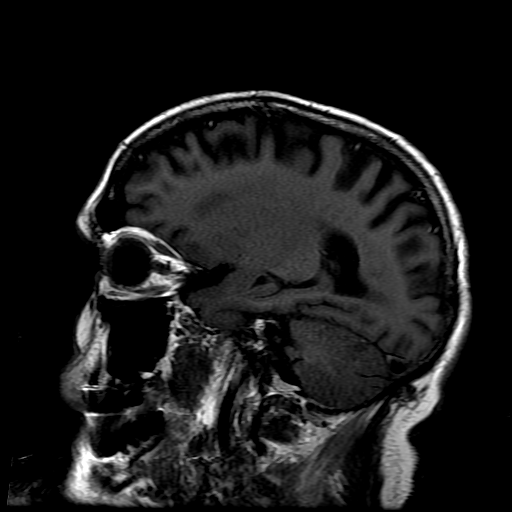
[im 25/25]
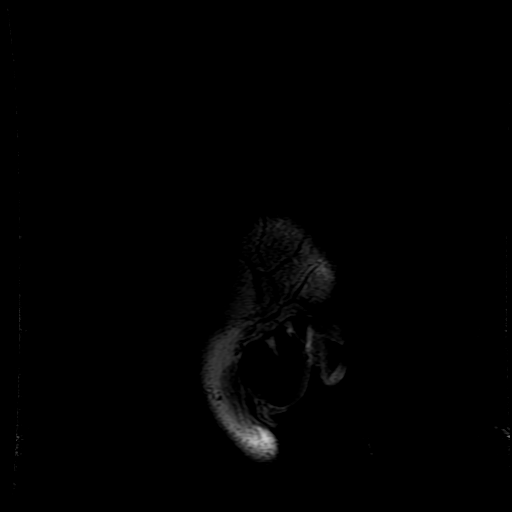

[Series 4: DWI · axial · 3.0mm · 1.09mm/px · z∈[+16,+140]mm · 10 of 98 slices shown (1 of 4)]
[im 7/98]
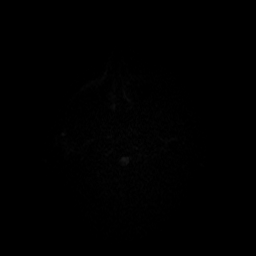
[im 13/98]
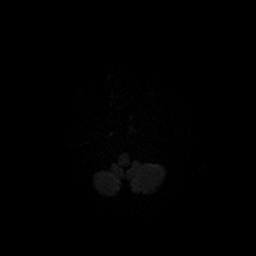
[im 20/98]
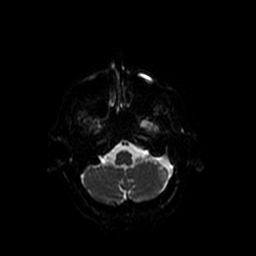
[im 33/98]
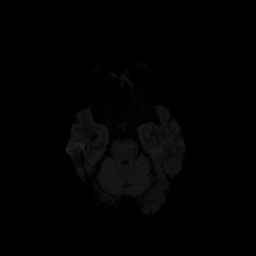
[im 46/98]
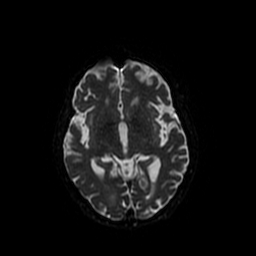
[im 52/98]
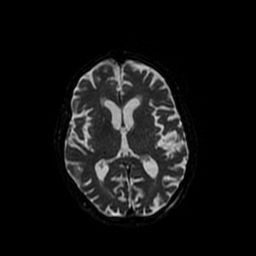
[im 59/98]
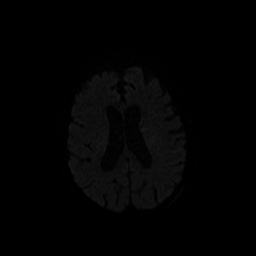
[im 72/98]
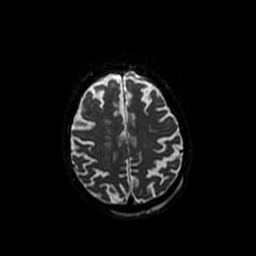
[im 85/98]
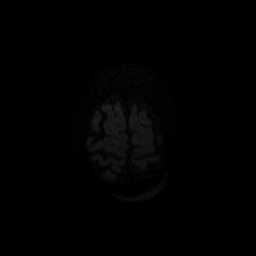
[im 98/98]
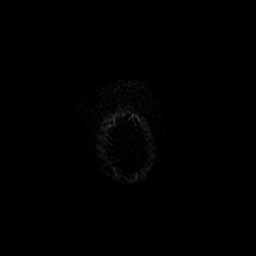

[Series 5: DWI · coronal · 5.0mm · 1.09mm/px · 11 of 66 slices shown (2 of 4)]
[im 1/66]
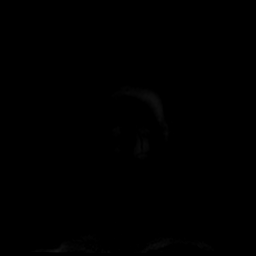
[im 7/66]
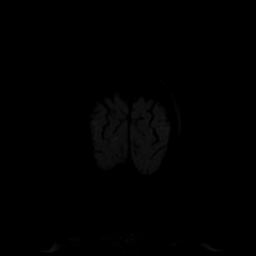
[im 14/66]
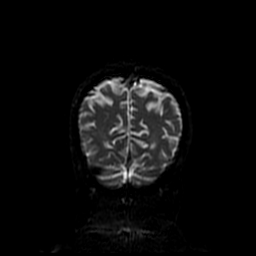
[im 20/66]
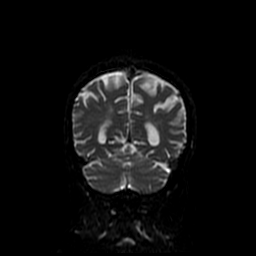
[im 27/66]
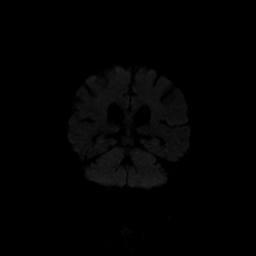
[im 33/66]
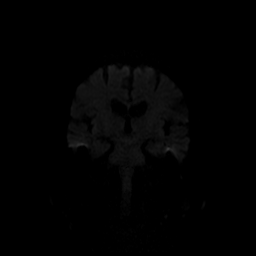
[im 40/66]
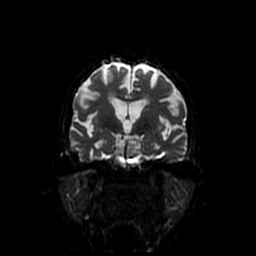
[im 46/66]
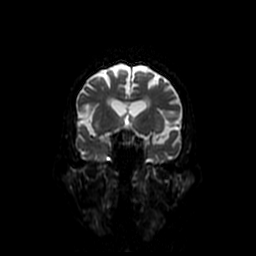
[im 53/66]
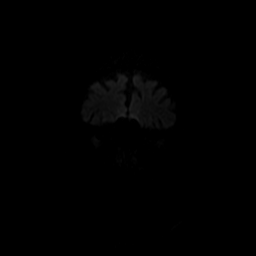
[im 59/66]
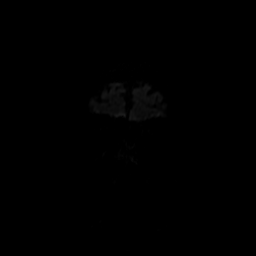
[im 66/66]
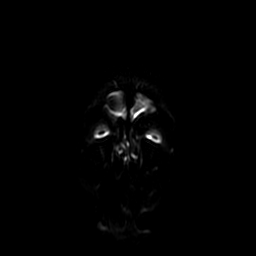

[Series 6: T2 · axial · 5.0mm · 0.43mm/px · z∈[-7,+125]mm · 4 of 25 slices shown]
[im 1/25]
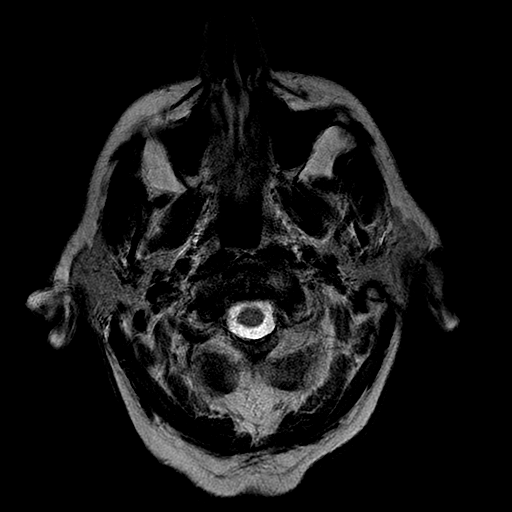
[im 9/25]
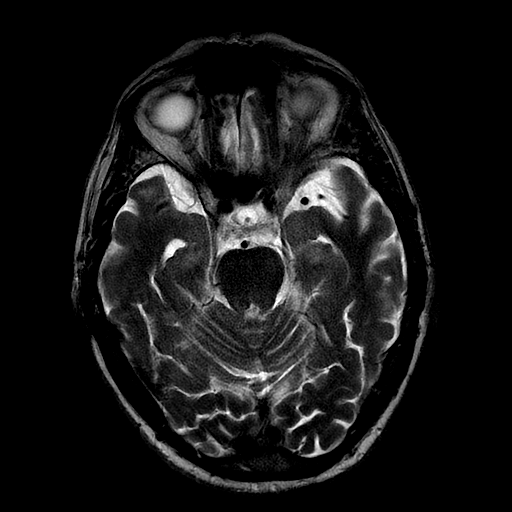
[im 17/25]
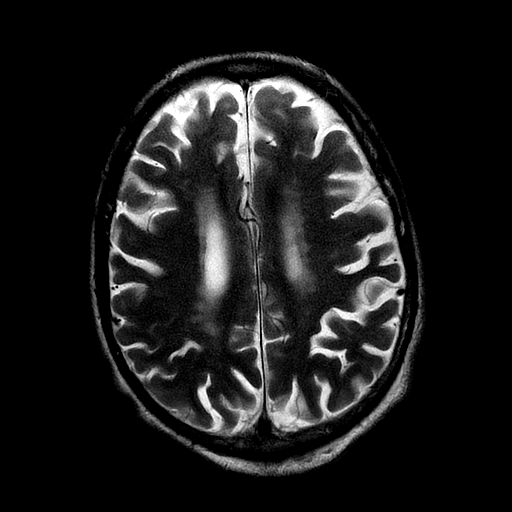
[im 25/25]
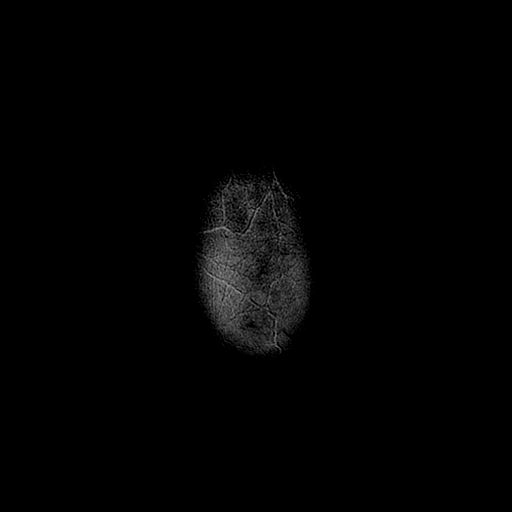

[Series 400: DWI · axial · 3.0mm · 1.09mm/px · z∈[+8,+140]mm · 8 of 49 slices shown (3 of 4)]
[im 1/49]
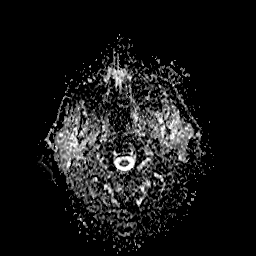
[im 7/49]
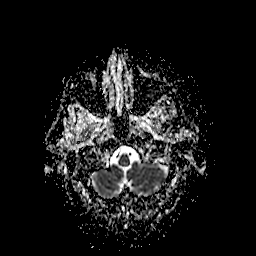
[im 14/49]
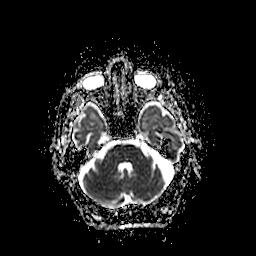
[im 21/49]
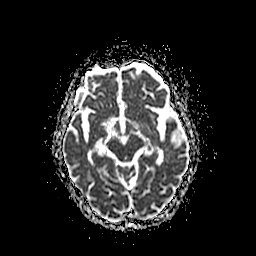
[im 28/49]
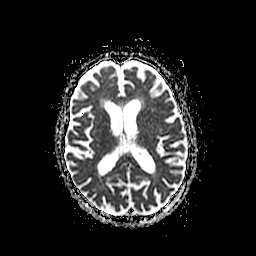
[im 35/49]
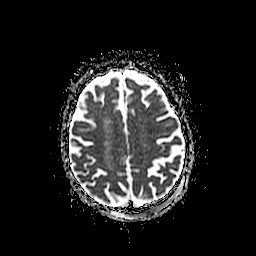
[im 42/49]
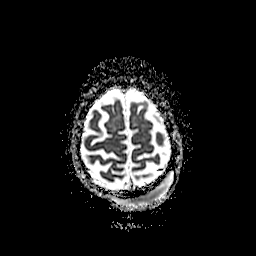
[im 49/49]
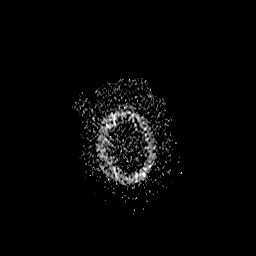

[Series 500: DWI · coronal · 5.0mm · 1.09mm/px · 5 of 33 slices shown (4 of 4)]
[im 1/33]
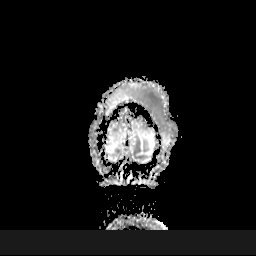
[im 9/33]
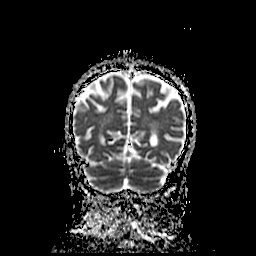
[im 17/33]
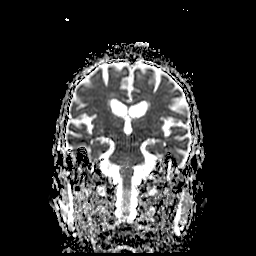
[im 25/33]
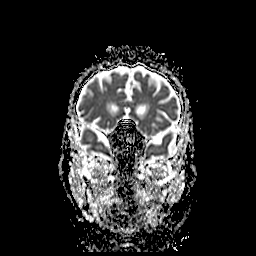
[im 33/33]
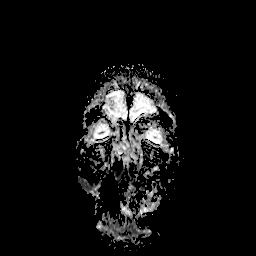

[42 of 48 positions shown; findings below may reference images not displayed]

FINDINGS: Study is limited as the patient was unable to tolerate the full
length of the exam.

Diffuse prominence of the CSF containing spaces compatible with
generalized cerebral atrophy. Patchy T2 signal intensity within the
periventricular white matter likely related to chronic small vessel
ischemic disease, mild nature. Probable small remote infarcts within
the left cerebellum.

Minimal patchy diffusion abnormality present within in region of
previously identified subependymal hemorrhage at the level of the
left caudate. Assess tiny inner ears hemispheric subdural collection
not well seen on this exam, but grossly stable. No other definite
acute intracranial abnormality. Tiny foci of diffusion abnormality
measuring 4 mm in the right periatrial white matter, consistent with
acute small vessel ischemia (series 4, image 27). No other evidence
for acute infarct. Major intracranial vascular flow voids are
maintained.

No mass lesion, midline shift, or mass effect. Ventricular
prominence related to global parenchymal volume loss of
hydrocephalus. No extra-axial fluid collection. Major dural sinuses
are grossly patent.

Craniocervical junction within normal limits. Degenerative
spondylolysis noted at C3-4 with resultant mild canal stenosis.

Pituitary gland normal.  No acute abnormality about the orbits.

Paranasal sinuses are clear. Small bilateral mastoid effusions.
Inner ear structures normal.

Left posterior scalp contusion present. Bone marrow signal intensity
normal.
IMPRESSION: 1. Grossly stable subcentimeter subependymal hemorrhage centered at
the left caudate body as well as tiny left parafalcine subdural
hematoma. These are better evaluated prior CT from earlier the same
day.
2. Tiny 4 mm foci of restricted diffusion within the right
periatrial white matter, consistent with acute small vessel
ischemia.
3. Left parieto-occipital scalp contusion.
4. Two tiny remote left cerebellar infarcts.
5. Age-related cerebral atrophy with mild chronic small vessel
ischemic disease.

## 2017-06-21 DIAGNOSIS — Z23 Encounter for immunization: Secondary | ICD-10-CM | POA: Diagnosis not present

## 2017-09-19 DIAGNOSIS — E8809 Other disorders of plasma-protein metabolism, not elsewhere classified: Secondary | ICD-10-CM | POA: Diagnosis not present

## 2017-09-19 DIAGNOSIS — G459 Transient cerebral ischemic attack, unspecified: Secondary | ICD-10-CM | POA: Diagnosis not present

## 2017-09-19 DIAGNOSIS — N189 Chronic kidney disease, unspecified: Secondary | ICD-10-CM | POA: Diagnosis not present

## 2017-09-19 DIAGNOSIS — H9319 Tinnitus, unspecified ear: Secondary | ICD-10-CM | POA: Diagnosis not present

## 2017-09-19 DIAGNOSIS — E7800 Pure hypercholesterolemia, unspecified: Secondary | ICD-10-CM | POA: Diagnosis not present

## 2017-09-19 DIAGNOSIS — Z7952 Long term (current) use of systemic steroids: Secondary | ICD-10-CM | POA: Diagnosis not present

## 2017-10-05 DIAGNOSIS — M359 Systemic involvement of connective tissue, unspecified: Secondary | ICD-10-CM | POA: Diagnosis not present

## 2017-10-05 DIAGNOSIS — Z139 Encounter for screening, unspecified: Secondary | ICD-10-CM | POA: Diagnosis not present

## 2017-10-05 DIAGNOSIS — H9190 Unspecified hearing loss, unspecified ear: Secondary | ICD-10-CM | POA: Diagnosis not present

## 2017-10-05 DIAGNOSIS — H9319 Tinnitus, unspecified ear: Secondary | ICD-10-CM | POA: Diagnosis not present

## 2017-10-05 DIAGNOSIS — N189 Chronic kidney disease, unspecified: Secondary | ICD-10-CM | POA: Diagnosis not present

## 2017-11-02 DIAGNOSIS — M359 Systemic involvement of connective tissue, unspecified: Secondary | ICD-10-CM | POA: Diagnosis not present

## 2017-11-02 DIAGNOSIS — Z13820 Encounter for screening for osteoporosis: Secondary | ICD-10-CM | POA: Diagnosis not present

## 2017-11-02 DIAGNOSIS — H538 Other visual disturbances: Secondary | ICD-10-CM | POA: Diagnosis not present

## 2017-11-02 DIAGNOSIS — H9319 Tinnitus, unspecified ear: Secondary | ICD-10-CM | POA: Diagnosis not present

## 2017-11-02 DIAGNOSIS — R768 Other specified abnormal immunological findings in serum: Secondary | ICD-10-CM | POA: Diagnosis not present

## 2017-11-02 DIAGNOSIS — H9190 Unspecified hearing loss, unspecified ear: Secondary | ICD-10-CM | POA: Diagnosis not present

## 2017-11-02 DIAGNOSIS — N189 Chronic kidney disease, unspecified: Secondary | ICD-10-CM | POA: Diagnosis not present

## 2017-11-03 DIAGNOSIS — M810 Age-related osteoporosis without current pathological fracture: Secondary | ICD-10-CM | POA: Diagnosis not present

## 2017-11-03 DIAGNOSIS — H40013 Open angle with borderline findings, low risk, bilateral: Secondary | ICD-10-CM | POA: Diagnosis not present

## 2017-11-03 DIAGNOSIS — H532 Diplopia: Secondary | ICD-10-CM | POA: Diagnosis not present

## 2017-11-03 DIAGNOSIS — H2513 Age-related nuclear cataract, bilateral: Secondary | ICD-10-CM | POA: Diagnosis not present

## 2017-11-03 DIAGNOSIS — H25013 Cortical age-related cataract, bilateral: Secondary | ICD-10-CM | POA: Diagnosis not present

## 2017-11-28 DIAGNOSIS — N189 Chronic kidney disease, unspecified: Secondary | ICD-10-CM | POA: Diagnosis not present

## 2017-11-28 DIAGNOSIS — M359 Systemic involvement of connective tissue, unspecified: Secondary | ICD-10-CM | POA: Diagnosis not present

## 2017-11-28 DIAGNOSIS — M810 Age-related osteoporosis without current pathological fracture: Secondary | ICD-10-CM | POA: Diagnosis not present

## 2017-11-28 DIAGNOSIS — H538 Other visual disturbances: Secondary | ICD-10-CM | POA: Diagnosis not present

## 2017-11-28 DIAGNOSIS — H9190 Unspecified hearing loss, unspecified ear: Secondary | ICD-10-CM | POA: Diagnosis not present

## 2017-11-28 DIAGNOSIS — R768 Other specified abnormal immunological findings in serum: Secondary | ICD-10-CM | POA: Diagnosis not present

## 2017-11-28 DIAGNOSIS — H9319 Tinnitus, unspecified ear: Secondary | ICD-10-CM | POA: Diagnosis not present

## 2017-12-07 DIAGNOSIS — M316 Other giant cell arteritis: Secondary | ICD-10-CM | POA: Diagnosis not present

## 2017-12-07 DIAGNOSIS — H9313 Tinnitus, bilateral: Secondary | ICD-10-CM | POA: Diagnosis not present

## 2018-01-30 ENCOUNTER — Encounter (HOSPITAL_COMMUNITY): Payer: Self-pay | Admitting: Emergency Medicine

## 2018-01-30 ENCOUNTER — Other Ambulatory Visit: Payer: Self-pay

## 2018-01-30 ENCOUNTER — Emergency Department (HOSPITAL_COMMUNITY)
Admission: EM | Admit: 2018-01-30 | Discharge: 2018-01-30 | Disposition: A | Discharge status: Home / Self Care | Payer: Medicare Other | Attending: Emergency Medicine | Admitting: Emergency Medicine

## 2018-01-30 DIAGNOSIS — Z79899 Other long term (current) drug therapy: Secondary | ICD-10-CM | POA: Insufficient documentation

## 2018-01-30 DIAGNOSIS — Y69 Unspecified misadventure during surgical and medical care: Secondary | ICD-10-CM | POA: Insufficient documentation

## 2018-01-30 DIAGNOSIS — T8189XA Other complications of procedures, not elsewhere classified, initial encounter: Secondary | ICD-10-CM | POA: Insufficient documentation

## 2018-01-30 DIAGNOSIS — I10 Essential (primary) hypertension: Secondary | ICD-10-CM | POA: Insufficient documentation

## 2018-01-30 DIAGNOSIS — T148XXA Other injury of unspecified body region, initial encounter: Secondary | ICD-10-CM

## 2018-01-30 DIAGNOSIS — Z7982 Long term (current) use of aspirin: Secondary | ICD-10-CM | POA: Insufficient documentation

## 2018-01-30 DIAGNOSIS — L820 Inflamed seborrheic keratosis: Secondary | ICD-10-CM | POA: Diagnosis not present

## 2018-01-30 DIAGNOSIS — L7622 Postprocedural hemorrhage and hematoma of skin and subcutaneous tissue following other procedure: Secondary | ICD-10-CM | POA: Diagnosis not present

## 2018-01-30 DIAGNOSIS — Z87891 Personal history of nicotine dependence: Secondary | ICD-10-CM | POA: Insufficient documentation

## 2018-01-30 DIAGNOSIS — L821 Other seborrheic keratosis: Secondary | ICD-10-CM | POA: Diagnosis not present

## 2018-01-30 DIAGNOSIS — S1191XA Laceration without foreign body of unspecified part of neck, initial encounter: Secondary | ICD-10-CM | POA: Diagnosis not present

## 2018-01-30 DIAGNOSIS — Z7901 Long term (current) use of anticoagulants: Secondary | ICD-10-CM | POA: Insufficient documentation

## 2018-01-30 DIAGNOSIS — R58 Hemorrhage, not elsewhere classified: Secondary | ICD-10-CM | POA: Insufficient documentation

## 2018-01-30 MED ORDER — LIDOCAINE-EPINEPHRINE (PF) 2 %-1:200000 IJ SOLN
10.0000 mL | Freq: Once | INTRAMUSCULAR | Status: AC
  Administered 2018-01-30: 10 mL via INTRADERMAL
  Filled 2018-01-30: qty 20

## 2018-01-30 MED ORDER — TRANEXAMIC ACID 1000 MG/10ML IV SOLN
500.0000 mg | Freq: Once | INTRAVENOUS | Status: AC
  Administered 2018-01-30: 500 mg via TOPICAL
  Filled 2018-01-30: qty 10

## 2018-01-30 NOTE — ED Provider Notes (Signed)
Patient placed in Quick Look pathway, seen and evaluated   Chief Complaint: bleeding   HPI:   84 y.o. male who presents for evaluation of bleeding that began at 4 PM this afternoon.  Patient reports he had a mole removed on the left side of his neck.  Patient reports an approximate 4 PM, the area started bleeding profusely.  Patient has not been able to control the bleeding since onset.  Patient is currently on Plavix and aspirin.  Patient denies any chest pain, difficulty breathing.  ROS: Bleeding   Physical Exam:   Gen: No distress  Neuro: Awake and Alert  Skin: Warm    Focused Exam: Left neck with small incision site that is profusely bleeding.  Pressure dressing applied   Initiation of care has begun. The patient has been counseled on the process, plan, and necessity for staying for the completion/evaluation, and the remainder of the medical screening examination    Desma Mcgregor 01/30/18 2006    Mesner, Corene Cornea, MD 02/01/18 [phone]

## 2018-01-30 NOTE — ED Notes (Signed)
Pt given supplies to dress wound at home.

## 2018-01-30 NOTE — ED Notes (Signed)
ED Provider at bedside. 

## 2018-01-30 NOTE — Discharge Instructions (Addendum)
If start bleeding again, hold firm pressure for 10 min. If unable to stop bleeding, return to ED.

## 2018-01-30 NOTE — ED Notes (Signed)
Pharmacy notified of need for tranexamic acid.

## 2018-01-30 NOTE — ED Triage Notes (Signed)
Pt presents with bleeding from a mole that was removed today. Mole removed at 1100am, bleeding began at 1600. Pt takes plavix and aspirin.

## 2018-01-30 NOTE — ED Provider Notes (Signed)
[facility] Provider Note   CSN: [id number] Arrival date & time: 01/30/18  1955     History   Chief Complaint Chief Complaint  Patient presents with  . Bleeding/Bruising    HPI Casey Lee is a 84 y.o. male.  HPI Casey Lee is a 84 y.o. male presents to ED with bleeding wound. Pt states he had a mole removed by dermatologist this morning around 11am. States she cauterized it there. About 4pm, he started having heavy bleeding form the wound. Unable to stop it at home. Denies pain. No dizziness or light headiness. Takes aspirin 81mg , prednisone 5mg , plavix.  Nothing makes his bleeding better or worse.  No history of the same. Wound is to the right side of the neck.   Past Medical History:  Diagnosis Date  . Hearing loss   . High cholesterol   . UTI (lower urinary tract infection)   . Vertigo     Patient Active Problem List   Diagnosis Date Noted  . HLD (hyperlipidemia) 03/19/2016  . SDH (subdural hematoma) ([place]) 01/28/2016  . BPPV (benign paroxysmal positional vertigo) 01/28/2016  . Cerebral infarction [facility]) - incidental, punctate R periventricular infarct 01/28/2016  . Essential hypertension 01/28/2016  . Hyperlipidemia LDL goal <70 01/28/2016  . Acute encephalopathy   . Traumatic hemorrhage of cerebrum with loss of consciousness ([place]) - tiny L caudate 01/24/2016    Past Surgical History:  Procedure Laterality Date  . none          Home Medications    Prior to Admission medications   Medication Sig Start Date End Date Taking? Authorizing Provider  alendronate (FOSAMAX) 70 MG tablet Take 70 mg by mouth once a week. On Monday 01/12/18  Yes [provider]  aspirin EC 81 MG tablet Take 1 tablet (81 mg total) by mouth daily. Patient taking differently: Take 81 mg by mouth daily at 12 noon.  03/19/16  Yes Rosalin Hawking, MD  clopidogrel (PLAVIX) 75 MG tablet Take 75 mg by mouth every evening.  09/08/16  Yes  [provider]  Coenzyme Q10 (COQ10 PO) Take 1 tablet by mouth daily.   Yes [provider]  loratadine (CLARITIN) 10 MG tablet Take 10 mg by mouth daily.    Yes [provider]  predniSONE (DELTASONE) 5 MG tablet Take 5 mg by mouth daily. 11/03/17  Yes [provider]  rosuvastatin (CRESTOR) 10 MG tablet Take 10 mg by mouth daily.  02/16/16  Yes [provider]  TURMERIC PO Take 1 tablet by mouth daily.   Yes [provider]    Family History Family History  Problem Relation Age of Onset  . Rheum arthritis Father   . Heart failure Father        Deceased, 20  . Rheum arthritis Sister   . Diabetes Mellitus II Sister   . Healthy Son        x2    Social History Social History   Tobacco Use  . Smoking status: Former Smoker    Types: Cigarettes    Last attempt to quit: 11/07/1952    Years since quitting: 65.2  . Smokeless tobacco: Never Used  Substance Use Topics  . Alcohol use: No    Alcohol/week: 0.0 oz  . Drug use: No     Allergies   Patient has no known allergies.   Review of Systems Review of Systems  Constitutional: Negative for chills and fever.  Eyes: Negative  for visual disturbance.  Skin: Positive for wound.  Neurological: Negative for dizziness, weakness, light-headedness, numbness and headaches.  All other systems reviewed and are negative.    Physical Exam Updated Vital Signs BP 123/68   Pulse 88   Temp 97.6 F (36.4 C) (Oral)   Resp 18   SpO2 99%   Physical Exam  Constitutional: He is oriented to person, place, and time. He appears well-developed and well-nourished. No distress.  Eyes: Pupils are equal, round, and reactive to light. Conjunctivae and EOM are normal.  Neck: Neck supple.  Cardiovascular: Normal rate.  Pulmonary/Chest: No respiratory distress.  Abdominal: He exhibits no distension.  Neurological: He is oriented to person, place, and time.  Skin: Skin is warm and dry.  2x2cm  wound to the right mid neck that is actively bleeding from multiple locations with one being a very small pulsatile bleed. Gauze covering the wound saturated with blood.   Nursing note and vitals reviewed.    ED Treatments / Results  Labs (all labs ordered are listed, but only abnormal results are displayed) Labs Reviewed - No data to display  EKG None  Radiology No results found.  Procedures Cauterization Date/Time: 01/30/2018 10:43 PM Performed by: Jeannett Senior, PA-C Authorized by: Jeannett Senior, PA-C  Consent: Verbal consent obtained. Risks and benefits: risks, benefits and alternatives were discussed Consent given by: patient Site marked: the operative site was marked Patient identity confirmed: verbally with patient Time out: Immediately prior to procedure a "time out" was called to verify the correct patient, procedure, equipment, support staff and site/side marked as required. Preparation: Patient was prepped and draped in the usual sterile fashion. Local anesthesia used: yes Anesthesia: local infiltration  Anesthesia: Local anesthesia used: yes Local Anesthetic: lidocaine 2% with epinephrine  .Marland KitchenLaceration Repair Date/Time: 01/30/2018 10:44 PM Performed by: Jeannett Senior, PA-C Authorized by: Jeannett Senior, PA-C   Consent:    Consent obtained:  Verbal   Consent given by:  Patient   Risks discussed:  Vascular damage and poor wound healing   Alternatives discussed:  No treatment Anesthesia (see MAR for exact dosages):    Anesthesia method:  Local infiltration   Local anesthetic:  Lidocaine 2% WITH epi Laceration details:    Location:  Neck   Neck location:  R anterior Repair type:    Repair type:  Simple Pre-procedure details:    Preparation:  Patient was prepped and draped in usual sterile fashion Exploration:    Hemostasis achieved with:  Cautery, tied off vessels and epinephrine   Wound exploration: wound explored through full  range of motion and entire depth of wound probed and visualized   Treatment:    Irrigation solution:  Sterile saline Skin repair:    Repair method:  Sutures   Suture size:  5-0   Suture material:  Fast-absorbing gut   Suture technique:  Figure eight   Number of sutures:  2 Post-procedure details:    Patient tolerance of procedure:  Tolerated well, no immediate complications   (including critical care time)  Medications Ordered in ED Medications  lidocaine-EPINEPHrine (XYLOCAINE W/EPI) 2 %-1:200000 (PF) injection 10 mL (has no administration in time range)  lidocaine-EPINEPHrine (XYLOCAINE W/EPI) 2 %-1:200000 (PF) injection 10 mL (10 mLs Intradermal Given 01/30/18 2045)  tranexamic acid (CYKLOKAPRON) injection 500 mg (500 mg Topical Given 01/30/18 2134)     Initial Impression / Assessment and Plan / ED Course  I have reviewed the triage vital signs and the nursing notes.  Pertinent labs &  imaging results that were available during my care of the patient were reviewed by me and considered in my medical decision making (see chart for details).     Pt with bleeding from the wound on the right neck after a mole was removed earlier today.  Patient denies any other complaints, specifically no dizziness or lightheadedness.  Vital signs are normal.  Initially wound was cleaned, tried to cauterize using epinephrine and electrocautery.  Wound still oozing blood.  Tried TXA topically with no improvement.  Eventually was able to place 2 figure-of-eight stitches which slowed down the bleeding.  Topical combat gauze.  Will monitor.  Bleeding symptoms slowed down somewhat at this time.  Patient will be discharged home with combat gauze.  If bleeding starts again, instructed to hold pressure firmly for 10 minutes.  If unable to stop the bleeding return to emergency department.  Vitals:   01/30/18 2007 01/30/18 2030 01/30/18 2130  BP: (!) 154/114 (!) 148/93 123/68  Pulse: 84 81 88  Resp: 16 17 18     Temp: 97.6 F (36.4 C)    TempSrc: Oral    SpO2: 100% 100% 99%     Final Clinical Impressions(s) / ED Diagnoses   Final diagnoses:  Bleeding from wound    ED Discharge Orders    None       Jeannett Senior, PA-C 01/30/18 2258    Virgel Manifold, MD 02/01/18 2253

## 2018-01-30 NOTE — ED Notes (Addendum)
ED Provider at bedside. Pt bled through tranexamic dressing, EDP notified.

## 2018-01-31 DIAGNOSIS — M359 Systemic involvement of connective tissue, unspecified: Secondary | ICD-10-CM | POA: Diagnosis not present

## 2018-01-31 DIAGNOSIS — R768 Other specified abnormal immunological findings in serum: Secondary | ICD-10-CM | POA: Diagnosis not present

## 2018-01-31 DIAGNOSIS — N189 Chronic kidney disease, unspecified: Secondary | ICD-10-CM | POA: Diagnosis not present

## 2018-01-31 DIAGNOSIS — H9319 Tinnitus, unspecified ear: Secondary | ICD-10-CM | POA: Diagnosis not present

## 2018-01-31 DIAGNOSIS — H9190 Unspecified hearing loss, unspecified ear: Secondary | ICD-10-CM | POA: Diagnosis not present

## 2018-01-31 DIAGNOSIS — M810 Age-related osteoporosis without current pathological fracture: Secondary | ICD-10-CM | POA: Diagnosis not present

## 2018-03-05 DIAGNOSIS — R946 Abnormal results of thyroid function studies: Secondary | ICD-10-CM | POA: Diagnosis not present

## 2018-03-05 DIAGNOSIS — E789 Disorder of lipoprotein metabolism, unspecified: Secondary | ICD-10-CM | POA: Diagnosis not present

## 2018-03-05 DIAGNOSIS — Z7952 Long term (current) use of systemic steroids: Secondary | ICD-10-CM | POA: Diagnosis not present

## 2018-03-05 DIAGNOSIS — Z125 Encounter for screening for malignant neoplasm of prostate: Secondary | ICD-10-CM | POA: Diagnosis not present

## 2018-03-05 DIAGNOSIS — M359 Systemic involvement of connective tissue, unspecified: Secondary | ICD-10-CM | POA: Diagnosis not present

## 2018-03-12 DIAGNOSIS — Z0000 Encounter for general adult medical examination without abnormal findings: Secondary | ICD-10-CM | POA: Diagnosis not present

## 2018-03-12 DIAGNOSIS — R7301 Impaired fasting glucose: Secondary | ICD-10-CM | POA: Diagnosis not present

## 2018-03-12 DIAGNOSIS — N189 Chronic kidney disease, unspecified: Secondary | ICD-10-CM | POA: Diagnosis not present

## 2018-03-12 DIAGNOSIS — I10 Essential (primary) hypertension: Secondary | ICD-10-CM | POA: Diagnosis not present

## 2018-03-12 DIAGNOSIS — E039 Hypothyroidism, unspecified: Secondary | ICD-10-CM | POA: Diagnosis not present

## 2018-03-12 DIAGNOSIS — E789 Disorder of lipoprotein metabolism, unspecified: Secondary | ICD-10-CM | POA: Diagnosis not present

## 2018-03-12 DIAGNOSIS — G459 Transient cerebral ischemic attack, unspecified: Secondary | ICD-10-CM | POA: Diagnosis not present

## 2018-03-23 DIAGNOSIS — H538 Other visual disturbances: Secondary | ICD-10-CM | POA: Diagnosis not present

## 2018-03-23 DIAGNOSIS — H40013 Open angle with borderline findings, low risk, bilateral: Secondary | ICD-10-CM | POA: Diagnosis not present

## 2018-03-23 DIAGNOSIS — H2513 Age-related nuclear cataract, bilateral: Secondary | ICD-10-CM | POA: Diagnosis not present

## 2018-03-23 DIAGNOSIS — H25013 Cortical age-related cataract, bilateral: Secondary | ICD-10-CM | POA: Diagnosis not present

## 2018-04-19 DIAGNOSIS — H538 Other visual disturbances: Secondary | ICD-10-CM | POA: Diagnosis not present

## 2018-04-19 DIAGNOSIS — M359 Systemic involvement of connective tissue, unspecified: Secondary | ICD-10-CM | POA: Diagnosis not present

## 2018-04-19 DIAGNOSIS — M810 Age-related osteoporosis without current pathological fracture: Secondary | ICD-10-CM | POA: Diagnosis not present

## 2018-04-19 DIAGNOSIS — R768 Other specified abnormal immunological findings in serum: Secondary | ICD-10-CM | POA: Diagnosis not present

## 2018-04-19 DIAGNOSIS — N189 Chronic kidney disease, unspecified: Secondary | ICD-10-CM | POA: Diagnosis not present

## 2018-04-24 DIAGNOSIS — H9113 Presbycusis, bilateral: Secondary | ICD-10-CM | POA: Diagnosis not present

## 2018-04-24 DIAGNOSIS — H9313 Tinnitus, bilateral: Secondary | ICD-10-CM | POA: Diagnosis not present

## 2018-04-24 DIAGNOSIS — M316 Other giant cell arteritis: Secondary | ICD-10-CM | POA: Diagnosis not present

## 2018-04-24 DIAGNOSIS — Z9889 Other specified postprocedural states: Secondary | ICD-10-CM | POA: Diagnosis not present

## 2018-04-25 NOTE — H&P (Signed)
HPI:   Casey Lee is a 84 y.o. male who presents as a consult Patient.   Referring Provider: Dwan Bolt, MD  Chief complaint: Hearing trouble.  HPI: He has had trouble with a static sound in both ears. He is on low-dose prednisone currently for presumed arteritis. He has not had a biopsy. He is otherwise very healthy and does not have any other complaints. He feels that his hearing is pretty good. He did have some temporary hearing loss in the past. That seemed to get better with prednisone.  PMH/Meds/All/SocHx/FamHx/ROS:   Past Medical History:  Diagnosis Date  . Varicella   Past Surgical History:  Procedure Laterality Date  . SKIN BIOPSY   No family history of bleeding disorders, wound healing problems or difficulty with anesthesia.   Social History   Social History  . Marital status: Married  Spouse name: N/A  . Number of children: N/A  . Years of education: N/A   Occupational History  . Not on file.   Social History Main Topics  . Smoking status: Never Smoker  . Smokeless tobacco: Never Used  . Alcohol use Yes  Comment: very little  . Drug use: Unknown  . Sexual activity: Not on file   Other Topics Concern  . Not on file   Social History Narrative  . No narrative on file   Current Outpatient Prescriptions:  . aspirin 81 MG EC tablet *ANTIPLATELET*, Take by mouth., Disp: , Rfl:  . clopidogrel (PLAVIX) 75 mg tablet *ANTIPLATELET*, , Disp: , Rfl:  . hydrocortisone 2.5 % lotion, Apply to scalp and face up to twice a day as needed for itching, Disp: 118 mL, Rfl: 5 . loratadine (CLARITIN) 10 mg tablet, Take by mouth., Disp: , Rfl:  . rosuvastatin (CRESTOR) 10 MG tablet, , Disp: , Rfl:  . triamcinolone (KENALOG) 0.1 % cream, Apply to itchy rash on body as needed up to twice a day, Disp: 454 g, Rfl: 3 . diphenhydrAMINE (BENADRYL) 25 mg capsule, Take by mouth., Disp: , Rfl:   A complete ROS was performed with pertinent positives/negatives noted in the  HPI. The remainder of the ROS are negative.   Physical Exam:   BP 153/90  Ht 1.727 m (5\' 8" )  Wt 65.3 kg (144 lb)  BMI 21.90 kg/m   General: Healthy and alert, in no distress, breathing easily. Normal affect. In a pleasant mood. Head: Normocephalic, atraumatic. No masses, or scars. Eyes: Pupils are equal, and reactive to light. Vision is grossly intact. No spontaneous or gaze nystagmus. Ears: Ear canals are clear. Tympanic membranes are intact, with normal landmarks and the middle ears are clear and healthy. Hearing: Grossly normal. Nose: Nasal cavities are clear with healthy mucosa, no polyps or exudate.Airways are patent. Face: No masses or scars, facial nerve function is symmetric. Oral Cavity: No mucosal abnormalities are noted. Tongue with normal mobility. Dentition appears healthy. Oropharynx: Tonsils are symmetric. There are no mucosal masses identified. Tongue base appears normal and healthy. Larynx/Hypopharynx: deferred Chest: Deferred Neck: No palpable masses, no cervical adenopathy, no thyroid nodules or enlargement. Neuro: Cranial nerves II-XII will normal function. Balance: Normal gate. Other findings: none.  Independent Review of Additional Tests or Records:  Audiogram reveals a symmetric downsloping sensorineural hearing loss.  Procedures:  none  Impression & Plans:  Age-related hearing loss with resulting tinnitus. Consider hearing aids when he feels that he is ready.We discussed the benign nature of tinnitus and the lack of any specific therapy. Recommend environmental masking  such as radio, TV or a "white noise maker" to drown out the noise. Follow-up as needed.   Return visit. History of giant cell arteritis treated intermittently with steroids. His new rheumatologist would like a formal diagnosis before putting him on more aggressive steroid medication. He is also suffering with cataracts that will eventually need surgery. On exam, his face looks healthy.  There is no palpable mass. The temporal arteries are palpable but there is no "cordlike" changes on either side. I think open biopsy would be appropriate. We will schedule this at an outpatient center under strict local anesthetic. We will do the right side first and if necessary later on we will do the left side. Risks and benefits were discussed including scars, wound infection, weakness of the frontal branch of the facial nerve. All questions were answered.

## 2018-04-27 ENCOUNTER — Encounter (HOSPITAL_BASED_OUTPATIENT_CLINIC_OR_DEPARTMENT_OTHER): Payer: Self-pay | Admitting: *Deleted

## 2018-04-27 ENCOUNTER — Other Ambulatory Visit: Payer: Self-pay

## 2018-04-27 NOTE — Progress Notes (Signed)
Patient states per his PCP Dr Jani Gravel, OK to be off Plavix x3-5d prior to surgery. His last dose was 04-26-18.

## 2018-04-30 ENCOUNTER — Encounter (HOSPITAL_BASED_OUTPATIENT_CLINIC_OR_DEPARTMENT_OTHER)
Admission: RE | Disposition: A | Discharge status: Home / Self Care | Payer: Self-pay | Source: Ambulatory Visit | Attending: Otolaryngology

## 2018-04-30 ENCOUNTER — Other Ambulatory Visit: Payer: Self-pay

## 2018-04-30 ENCOUNTER — Ambulatory Visit (HOSPITAL_BASED_OUTPATIENT_CLINIC_OR_DEPARTMENT_OTHER): Payer: Medicare Other | Admitting: Anesthesiology

## 2018-04-30 ENCOUNTER — Encounter (HOSPITAL_BASED_OUTPATIENT_CLINIC_OR_DEPARTMENT_OTHER): Payer: Self-pay

## 2018-04-30 ENCOUNTER — Ambulatory Visit (HOSPITAL_BASED_OUTPATIENT_CLINIC_OR_DEPARTMENT_OTHER)
Admission: RE | Admit: 2018-04-30 | Discharge: 2018-04-30 | Disposition: A | Discharge status: Home / Self Care | Payer: Medicare Other | Source: Ambulatory Visit | Attending: Otolaryngology | Admitting: Otolaryngology

## 2018-04-30 DIAGNOSIS — H905 Unspecified sensorineural hearing loss: Secondary | ICD-10-CM | POA: Insufficient documentation

## 2018-04-30 DIAGNOSIS — Z7902 Long term (current) use of antithrombotics/antiplatelets: Secondary | ICD-10-CM | POA: Insufficient documentation

## 2018-04-30 DIAGNOSIS — M316 Other giant cell arteritis: Secondary | ICD-10-CM | POA: Diagnosis not present

## 2018-04-30 DIAGNOSIS — Z9889 Other specified postprocedural states: Secondary | ICD-10-CM | POA: Diagnosis not present

## 2018-04-30 DIAGNOSIS — Z79899 Other long term (current) drug therapy: Secondary | ICD-10-CM | POA: Insufficient documentation

## 2018-04-30 DIAGNOSIS — Z7982 Long term (current) use of aspirin: Secondary | ICD-10-CM | POA: Insufficient documentation

## 2018-04-30 DIAGNOSIS — Z8673 Personal history of transient ischemic attack (TIA), and cerebral infarction without residual deficits: Secondary | ICD-10-CM | POA: Diagnosis not present

## 2018-04-30 DIAGNOSIS — H9113 Presbycusis, bilateral: Secondary | ICD-10-CM | POA: Diagnosis not present

## 2018-04-30 DIAGNOSIS — I10 Essential (primary) hypertension: Secondary | ICD-10-CM | POA: Insufficient documentation

## 2018-04-30 DIAGNOSIS — Z87891 Personal history of nicotine dependence: Secondary | ICD-10-CM | POA: Diagnosis not present

## 2018-04-30 DIAGNOSIS — H9319 Tinnitus, unspecified ear: Secondary | ICD-10-CM | POA: Diagnosis not present

## 2018-04-30 DIAGNOSIS — I776 Arteritis, unspecified: Secondary | ICD-10-CM | POA: Diagnosis not present

## 2018-04-30 HISTORY — DX: Transient cerebral ischemic attack, unspecified: G45.9

## 2018-04-30 HISTORY — DX: Other giant cell arteritis: M31.6

## 2018-04-30 HISTORY — PX: ARTERY BIOPSY: SHX891

## 2018-04-30 SURGERY — BIOPSY TEMPORAL ARTERY
Anesthesia: Monitor Anesthesia Care | Site: Face | Laterality: Right

## 2018-04-30 MED ORDER — ONDANSETRON HCL 4 MG/2ML IJ SOLN: 4.0000 mg | Freq: Once | INTRAMUSCULAR | Status: DC | PRN

## 2018-04-30 MED ORDER — LIDOCAINE-EPINEPHRINE 1 %-1:100000 IJ SOLN
INTRAMUSCULAR | Status: DC | PRN
  Administered 2018-04-30: 2 mL

## 2018-04-30 MED ORDER — SCOPOLAMINE 1 MG/3DAYS TD PT72: 1.0000 | MEDICATED_PATCH | Freq: Once | TRANSDERMAL | Status: DC | PRN

## 2018-04-30 MED ORDER — MIDAZOLAM HCL 2 MG/2ML IJ SOLN: 1.0000 mg | INTRAMUSCULAR | Status: DC | PRN

## 2018-04-30 MED ORDER — FENTANYL CITRATE (PF) 100 MCG/2ML IJ SOLN: 50.0000 ug | INTRAMUSCULAR | Status: DC | PRN

## 2018-04-30 MED ORDER — LACTATED RINGERS IV SOLN
INTRAVENOUS | Status: DC
  Administered 2018-04-30: 08:00:00 via INTRAVENOUS

## 2018-04-30 MED ORDER — LIDOCAINE-EPINEPHRINE 1 %-1:100000 IJ SOLN
INTRAMUSCULAR | Status: AC
  Filled 2018-04-30: qty 1

## 2018-04-30 SURGICAL SUPPLY — 47 items
BANDAGE ADH SHEER 1  50/CT (GAUZE/BANDAGES/DRESSINGS) IMPLANT
BENZOIN TINCTURE PRP APPL 2/3 (GAUZE/BANDAGES/DRESSINGS) IMPLANT
BLADE CLIPPER SURG (BLADE) IMPLANT
BLADE SURG 15 STRL LF DISP TIS (BLADE) ×1 IMPLANT
BLADE SURG 15 STRL SS (BLADE) ×2
CANISTER SUCT 1200ML W/VALVE (MISCELLANEOUS) IMPLANT
CLEANER CAUTERY TIP 5X5 PAD (MISCELLANEOUS) ×1 IMPLANT
CLOSURE WOUND 1/2 X4 (GAUZE/BANDAGES/DRESSINGS)
COVER BACK TABLE 60X90IN (DRAPES) IMPLANT
COVER MAYO STAND STRL (DRAPES) ×3 IMPLANT
DERMABOND ADVANCED (GAUZE/BANDAGES/DRESSINGS) ×2
DERMABOND ADVANCED .7 DNX12 (GAUZE/BANDAGES/DRESSINGS) ×1 IMPLANT
DRAPE SURG 17X23 STRL (DRAPES) IMPLANT
DRAPE U-SHAPE 76X120 STRL (DRAPES) IMPLANT
ELECT COATED BLADE 2.86 ST (ELECTRODE) ×3 IMPLANT
ELECT REM PT RETURN 9FT ADLT (ELECTROSURGICAL) ×3
ELECTRODE REM PT RTRN 9FT ADLT (ELECTROSURGICAL) ×1 IMPLANT
GAUZE SPONGE 4X4 12PLY STRL LF (GAUZE/BANDAGES/DRESSINGS) IMPLANT
GLOVE ECLIPSE 7.5 STRL STRAW (GLOVE) ×3 IMPLANT
GLOVE SURG SS PI 7.0 STRL IVOR (GLOVE) ×3 IMPLANT
GOWN STRL REUS W/ TWL LRG LVL3 (GOWN DISPOSABLE) ×2 IMPLANT
GOWN STRL REUS W/TWL LRG LVL3 (GOWN DISPOSABLE) ×4
NEEDLE HYPO 25X1 1.5 SAFETY (NEEDLE) IMPLANT
NEEDLE PRECISIONGLIDE 27X1.5 (NEEDLE) ×3 IMPLANT
NS IRRIG 1000ML POUR BTL (IV SOLUTION) IMPLANT
PACK BASIN DAY SURGERY FS (CUSTOM PROCEDURE TRAY) ×3 IMPLANT
PAD CLEANER CAUTERY TIP 5X5 (MISCELLANEOUS) ×2
PENCIL FOOT CONTROL (ELECTRODE) ×3 IMPLANT
SHEET MEDIUM DRAPE 40X70 STRL (DRAPES) IMPLANT
SPONGE GAUZE 2X2 8PLY STER LF (GAUZE/BANDAGES/DRESSINGS) ×1
SPONGE GAUZE 2X2 8PLY STRL LF (GAUZE/BANDAGES/DRESSINGS) ×2 IMPLANT
STRIP CLOSURE SKIN 1/2X4 (GAUZE/BANDAGES/DRESSINGS) IMPLANT
SUCTION FRAZIER HANDLE 10FR (MISCELLANEOUS)
SUCTION TUBE FRAZIER 10FR DISP (MISCELLANEOUS) IMPLANT
SUT CHROMIC 4 0 P 3 18 (SUTURE) ×3 IMPLANT
SUT ETHILON 5 0 P 3 18 (SUTURE)
SUT NYLON ETHILON 5-0 P-3 1X18 (SUTURE) IMPLANT
SUT SILK 3 0 TIES 17X18 (SUTURE)
SUT SILK 3-0 18XBRD TIE BLK (SUTURE) IMPLANT
SUT SILK 4 0 TIES 17X18 (SUTURE) ×3 IMPLANT
SUT VICRYL 4-0 PS2 18IN ABS (SUTURE) IMPLANT
SWABSTICK POVIDONE IODINE SNGL (MISCELLANEOUS) IMPLANT
SYR CONTROL 10ML LL (SYRINGE) ×3 IMPLANT
TOWEL GREEN STERILE FF (TOWEL DISPOSABLE) ×3 IMPLANT
TRAY DSU PREP LF (CUSTOM PROCEDURE TRAY) ×3 IMPLANT
TUBE CONNECTING 20'X1/4 (TUBING)
TUBE CONNECTING 20X1/4 (TUBING) IMPLANT

## 2018-04-30 NOTE — Op Note (Signed)
OPERATIVE REPORT  DATE OF SURGERY: 04/30/2018  PATIENT:  Casey Lee,  84 y.o. male  PRE-OPERATIVE DIAGNOSIS:  Giant Cell Arteritis  POST-OPERATIVE DIAGNOSIS:  Giant Cell Arteritis  PROCEDURE:  Procedure(s): BIOPSY TEMPORAL ARTERY  SURGEON:  Beckie Salts, MD  ASSISTANTS: None  ANESTHESIA:   Local with monitored anesthesia care  EBL: 5 ml  DRAINS: None  LOCAL MEDICATIONS USED:  No 1% Xylocaine with epinephrine ne  SPECIMEN:  none  COUNTS:  Correct  PROCEDURE DETAILS: The patient was taken to the operating room and placed on the operating table in the supine position.  The right temporal area was prepped and draped in standard fashion.  Palpation revealed a pulse and the artery was outlined with a marking pen.  Local anesthetic was infiltrated and the incision was created using #15 scalpel.  Subcuticular tissue was divided.  A ohms retractor was used.  The superficial fascia was divided exposing the artery.  The artery was clamped at the upper and lower extent, and removed and sent for pathologic evaluation.  A total of about 2 to 2-1/2 cm was obtained.  4-0 silk ties were used on the arterial stumps.  Electrocautery was used for hemostasis.  The incision was reapproximated using interrupted inverted 3-0 chromic and a running 3-0 chromic subcuticular closure.  Dermabond was used on the skin.  Patient was transferred to recovery in stable condition.    PATIENT DISPOSITION:  To PACU, stable

## 2018-04-30 NOTE — Discharge Instructions (Signed)
You may shower and use soap and water. Do not use any creams, oils or ointment.  Call your surgeon if you experience:   1.  Fever over 101.0. 2.  Inability to urinate. 3.  Nausea and/or vomiting. 4.  Extreme swelling or bruising at the surgical site. 5.  Continued bleeding from the incision. 6.  Increased pain, redness or drainage from the incision. 7.  Problems related to your pain medication. 8.  Any problems and/or concerns

## 2018-04-30 NOTE — Interval H&P Note (Signed)
History and Physical Interval Note:  04/30/2018 8:54 AM  Casey Lee  has presented today for surgery, with the diagnosis of Giant Cell Artery  The various methods of treatment have been discussed with the patient and family. After consideration of risks, benefits and other options for treatment, the patient has consented to  Procedure(s): BIOPSY TEMPORAL ARTERY (Right) as a surgical intervention .  The patient's history has been reviewed, patient examined, no change in status, stable for surgery.  I have reviewed the patient's chart and labs.  Questions were answered to the patient's satisfaction.     Izora Gala

## 2018-04-30 NOTE — Transfer of Care (Signed)
Immediate Anesthesia Transfer of Care Note  Patient: Casey Lee  Procedure(s) Performed: BIOPSY TEMPORAL ARTERY (Right )  Patient Location: PACU  Anesthesia Type:General  Level of Consciousness: awake  Airway & Oxygen Therapy: Patient Spontanous Breathing  Post-op Assessment: Report given to RN and Post -op Vital signs reviewed and stable  Post vital signs: Reviewed and stable  Last Vitals:  Vitals Value Taken Time  BP    Temp    Pulse    Resp    SpO2      Last Pain:  Vitals:   04/30/18 0742  TempSrc: Oral  PainSc: 0-No pain         Complications: No apparent anesthesia complications

## 2018-04-30 NOTE — Anesthesia Postprocedure Evaluation (Signed)
Anesthesia Post Note  Patient: Casey Lee  Procedure(s) Performed: BIOPSY TEMPORAL ARTERY (Right Face)     Patient location during evaluation: PACU Anesthesia Type: MAC Level of consciousness: awake Vital Signs Assessment: post-procedure vital signs reviewed and stable Respiratory status: spontaneous breathing Cardiovascular status: stable Anesthetic complications: no    Last Vitals:  Vitals:   04/30/18 0955 04/30/18 1015  BP: (!) 181/89 (!) 156/95  Pulse: 60 64  Resp: (!) 22 15  Temp: 36.6 C   SpO2: 99% 99%    Last Pain:  Vitals:   04/30/18 0955  TempSrc:   PainSc: 0-No pain                 Chevez Sambrano

## 2018-04-30 NOTE — Anesthesia Preprocedure Evaluation (Signed)
Anesthesia Evaluation  Patient identified by MRN, date of birth, ID band Patient awake    Reviewed: Allergy & Precautions, NPO status , Patient's Chart, lab work & pertinent test results  Airway Mallampati: II  TM Distance: >3 FB Neck ROM: Full    Dental  (+) Teeth Intact, Dental Advisory Given   Pulmonary former smoker,    Pulmonary exam normal breath sounds clear to auscultation       Cardiovascular hypertension, Normal cardiovascular exam Rhythm:Regular Rate:Normal  Giant Cell Arteritis   Neuro/Psych TIA   GI/Hepatic negative GI ROS, Neg liver ROS,   Endo/Other  negative endocrine ROS  Renal/GU negative Renal ROS     Musculoskeletal negative musculoskeletal ROS (+)   Abdominal   Peds  Hematology  (+) Blood dyscrasia (Plavix), ,   Anesthesia Other Findings Day of surgery medications reviewed with the patient.  Reproductive/Obstetrics                             Anesthesia Physical Anesthesia Plan  ASA: III  Anesthesia Plan: MAC   Post-op Pain Management:    Induction: Intravenous  PONV Risk Score and Plan: 1 and Treatment may vary due to age or medical condition  Airway Management Planned: Nasal Cannula and Natural Airway  Additional Equipment:   Intra-op Plan:   Post-operative Plan:   Informed Consent: I have reviewed the patients History and Physical, chart, labs and discussed the procedure including the risks, benefits and alternatives for the proposed anesthesia with the patient or authorized representative who has indicated his/her understanding and acceptance.   Dental advisory given  Plan Discussed with: CRNA and Anesthesiologist  Anesthesia Plan Comments: (NO SEDATIVE/NARCOTIC MEDICATIONS PER PATIENT REQUEST)        Anesthesia Quick Evaluation

## 2018-05-01 ENCOUNTER — Encounter (HOSPITAL_BASED_OUTPATIENT_CLINIC_OR_DEPARTMENT_OTHER): Payer: Self-pay | Admitting: Otolaryngology

## 2018-05-03 DIAGNOSIS — H538 Other visual disturbances: Secondary | ICD-10-CM | POA: Diagnosis not present

## 2018-05-03 DIAGNOSIS — Z7952 Long term (current) use of systemic steroids: Secondary | ICD-10-CM | POA: Diagnosis not present

## 2018-05-03 DIAGNOSIS — S40022A Contusion of left upper arm, initial encounter: Secondary | ICD-10-CM | POA: Diagnosis not present

## 2018-05-03 DIAGNOSIS — N189 Chronic kidney disease, unspecified: Secondary | ICD-10-CM | POA: Diagnosis not present

## 2018-05-03 DIAGNOSIS — M810 Age-related osteoporosis without current pathological fracture: Secondary | ICD-10-CM | POA: Diagnosis not present

## 2018-05-03 DIAGNOSIS — R768 Other specified abnormal immunological findings in serum: Secondary | ICD-10-CM | POA: Diagnosis not present

## 2018-05-08 DIAGNOSIS — N189 Chronic kidney disease, unspecified: Secondary | ICD-10-CM | POA: Diagnosis not present

## 2018-05-08 DIAGNOSIS — M25561 Pain in right knee: Secondary | ICD-10-CM | POA: Diagnosis not present

## 2018-05-08 DIAGNOSIS — Z7952 Long term (current) use of systemic steroids: Secondary | ICD-10-CM | POA: Diagnosis not present

## 2018-05-08 DIAGNOSIS — M810 Age-related osteoporosis without current pathological fracture: Secondary | ICD-10-CM | POA: Diagnosis not present

## 2018-05-08 DIAGNOSIS — R768 Other specified abnormal immunological findings in serum: Secondary | ICD-10-CM | POA: Diagnosis not present

## 2018-05-08 DIAGNOSIS — H538 Other visual disturbances: Secondary | ICD-10-CM | POA: Diagnosis not present

## 2018-05-08 DIAGNOSIS — L0390 Cellulitis, unspecified: Secondary | ICD-10-CM | POA: Diagnosis not present

## 2018-05-11 ENCOUNTER — Encounter (HOSPITAL_COMMUNITY): Payer: Self-pay | Admitting: *Deleted

## 2018-05-11 ENCOUNTER — Emergency Department (HOSPITAL_COMMUNITY): Payer: Medicare Other

## 2018-05-11 ENCOUNTER — Inpatient Hospital Stay (HOSPITAL_COMMUNITY)
Admission: EM | Admit: 2018-05-11 | Discharge: 2018-05-16 | DRG: 500 | Disposition: A | Discharge status: Home Health | Payer: Medicare Other | Attending: Internal Medicine | Admitting: Internal Medicine

## 2018-05-11 ENCOUNTER — Other Ambulatory Visit: Payer: Self-pay

## 2018-05-11 DIAGNOSIS — I5033 Acute on chronic diastolic (congestive) heart failure: Secondary | ICD-10-CM | POA: Diagnosis present

## 2018-05-11 DIAGNOSIS — Z7982 Long term (current) use of aspirin: Secondary | ICD-10-CM

## 2018-05-11 DIAGNOSIS — I639 Cerebral infarction, unspecified: Secondary | ICD-10-CM | POA: Diagnosis not present

## 2018-05-11 DIAGNOSIS — M25561 Pain in right knee: Secondary | ICD-10-CM | POA: Diagnosis present

## 2018-05-11 DIAGNOSIS — N183 Chronic kidney disease, stage 3 unspecified: Secondary | ICD-10-CM | POA: Diagnosis present

## 2018-05-11 DIAGNOSIS — H9190 Unspecified hearing loss, unspecified ear: Secondary | ICD-10-CM | POA: Diagnosis present

## 2018-05-11 DIAGNOSIS — E785 Hyperlipidemia, unspecified: Secondary | ICD-10-CM | POA: Diagnosis present

## 2018-05-11 DIAGNOSIS — Z7902 Long term (current) use of antithrombotics/antiplatelets: Secondary | ICD-10-CM

## 2018-05-11 DIAGNOSIS — Z7952 Long term (current) use of systemic steroids: Secondary | ICD-10-CM

## 2018-05-11 DIAGNOSIS — I4892 Unspecified atrial flutter: Secondary | ICD-10-CM | POA: Diagnosis not present

## 2018-05-11 DIAGNOSIS — I480 Paroxysmal atrial fibrillation: Secondary | ICD-10-CM | POA: Diagnosis present

## 2018-05-11 DIAGNOSIS — I63311 Cerebral infarction due to thrombosis of right middle cerebral artery: Secondary | ICD-10-CM | POA: Diagnosis not present

## 2018-05-11 DIAGNOSIS — M7041 Prepatellar bursitis, right knee: Secondary | ICD-10-CM | POA: Diagnosis not present

## 2018-05-11 DIAGNOSIS — I130 Hypertensive heart and chronic kidney disease with heart failure and stage 1 through stage 4 chronic kidney disease, or unspecified chronic kidney disease: Secondary | ICD-10-CM | POA: Diagnosis not present

## 2018-05-11 DIAGNOSIS — N179 Acute kidney failure, unspecified: Secondary | ICD-10-CM | POA: Diagnosis present

## 2018-05-11 DIAGNOSIS — M316 Other giant cell arteritis: Secondary | ICD-10-CM | POA: Diagnosis present

## 2018-05-11 DIAGNOSIS — M71161 Other infective bursitis, right knee: Principal | ICD-10-CM | POA: Diagnosis present

## 2018-05-11 DIAGNOSIS — Z8673 Personal history of transient ischemic attack (TIA), and cerebral infarction without residual deficits: Secondary | ICD-10-CM

## 2018-05-11 DIAGNOSIS — E7800 Pure hypercholesterolemia, unspecified: Secondary | ICD-10-CM | POA: Diagnosis present

## 2018-05-11 DIAGNOSIS — J449 Chronic obstructive pulmonary disease, unspecified: Secondary | ICD-10-CM | POA: Diagnosis not present

## 2018-05-11 DIAGNOSIS — Z79899 Other long term (current) drug therapy: Secondary | ICD-10-CM

## 2018-05-11 DIAGNOSIS — R0609 Other forms of dyspnea: Secondary | ICD-10-CM | POA: Diagnosis not present

## 2018-05-11 DIAGNOSIS — M11261 Other chondrocalcinosis, right knee: Secondary | ICD-10-CM | POA: Diagnosis not present

## 2018-05-11 DIAGNOSIS — Z87891 Personal history of nicotine dependence: Secondary | ICD-10-CM

## 2018-05-11 LAB — COMPREHENSIVE METABOLIC PANEL
ALK PHOS: 89 U/L (ref 38–126)
ALT: 30 U/L (ref 0–44)
ANION GAP: 11 (ref 5–15)
AST: 26 U/L (ref 15–41)
Albumin: 2.9 g/dL — ABNORMAL LOW (ref 3.5–5.0)
BUN: 37 mg/dL — ABNORMAL HIGH (ref 8–23)
CALCIUM: 8.9 mg/dL (ref 8.9–10.3)
CO2: 17 mmol/L — AB (ref 22–32)
CREATININE: 1.72 mg/dL — AB (ref 0.61–1.24)
Chloride: 103 mmol/L (ref 98–111)
GFR, EST AFRICAN AMERICAN: 39 mL/min — AB (ref >60)
GFR, EST NON AFRICAN AMERICAN: 34 mL/min — AB (ref >60)
Glucose, Bld: 160 mg/dL — ABNORMAL HIGH (ref 70–99)
Potassium: 4.6 mmol/L (ref 3.5–5.1)
SODIUM: 131 mmol/L — AB (ref 135–145)
Total Bilirubin: 1.2 mg/dL (ref 0.3–1.2)
Total Protein: 5.9 g/dL — ABNORMAL LOW (ref 6.5–8.1)

## 2018-05-11 LAB — URINALYSIS, ROUTINE W REFLEX MICROSCOPIC
BILIRUBIN URINE: NEGATIVE
Glucose, UA: NEGATIVE mg/dL
HGB URINE DIPSTICK: NEGATIVE
Ketones, ur: NEGATIVE mg/dL
Leukocytes, UA: NEGATIVE
Nitrite: NEGATIVE
PROTEIN: NEGATIVE mg/dL
Specific Gravity, Urine: 1.005 (ref 1.005–1.030)
pH: 6 (ref 5.0–8.0)

## 2018-05-11 LAB — CBC WITH DIFFERENTIAL/PLATELET
BASOS ABS: 0 10*3/uL (ref 0.0–0.1)
BASOS PCT: 0 %
Eosinophils Absolute: 0.1 10*3/uL (ref 0.0–0.7)
Eosinophils Relative: 1 %
HCT: 40 % (ref 39.0–52.0)
Hemoglobin: 12.5 g/dL — ABNORMAL LOW (ref 13.0–17.0)
LYMPHS PCT: 3 %
Lymphs Abs: 0.3 10*3/uL — ABNORMAL LOW (ref 0.7–4.0)
MCH: 31 pg (ref 26.0–34.0)
MCHC: 31.3 g/dL (ref 30.0–36.0)
MCV: 99.3 fL (ref 78.0–100.0)
MONO ABS: 0.5 10*3/uL (ref 0.1–1.0)
Monocytes Relative: 4 %
NEUTROS PCT: 92 %
Neutro Abs: 10.7 10*3/uL — ABNORMAL HIGH (ref 1.7–7.7)
PLATELETS: UNDETERMINED 10*3/uL (ref 150–400)
RBC: 4.03 MIL/uL — ABNORMAL LOW (ref 4.22–5.81)
RDW: 14.7 % (ref 11.5–15.5)
WBC: 11.6 10*3/uL — ABNORMAL HIGH (ref 4.0–10.5)

## 2018-05-11 LAB — CREATININE, URINE, RANDOM: Creatinine, Urine: 24.67 mg/dL

## 2018-05-11 LAB — I-STAT CG4 LACTIC ACID, ED: Lactic Acid, Venous: 1.75 mmol/L (ref 0.5–1.9)

## 2018-05-11 LAB — SEDIMENTATION RATE: Sed Rate: 67 mm/hr — ABNORMAL HIGH (ref 0–16)

## 2018-05-11 LAB — BRAIN NATRIURETIC PEPTIDE: B NATRIURETIC PEPTIDE 5: 264.7 pg/mL — AB (ref 0.0–100.0)

## 2018-05-11 LAB — C-REACTIVE PROTEIN: CRP: 19.8 mg/dL — AB (ref <1.0)

## 2018-05-11 LAB — SODIUM, URINE, RANDOM: SODIUM UR: 71 mmol/L

## 2018-05-11 MED ORDER — LIDOCAINE-EPINEPHRINE (PF) 2 %-1:200000 IJ SOLN
20.0000 mL | Freq: Once | INTRAMUSCULAR | Status: AC
  Administered 2018-05-11: 20 mL
  Filled 2018-05-11: qty 20

## 2018-05-11 MED ORDER — ONDANSETRON HCL 4 MG PO TABS: 4.0000 mg | ORAL_TABLET | Freq: Four times a day (QID) | ORAL | Status: DC | PRN

## 2018-05-11 MED ORDER — HYDROCORTISONE NA SUCCINATE PF 100 MG IJ SOLR
50.0000 mg | Freq: Once | INTRAMUSCULAR | Status: AC
  Administered 2018-05-11: 50 mg via INTRAVENOUS
  Filled 2018-05-11 (×2): qty 1

## 2018-05-11 MED ORDER — DEXTROSE-NACL 5-0.45 % IV SOLN: INTRAVENOUS | Status: DC

## 2018-05-11 MED ORDER — SODIUM CHLORIDE 0.9 % IV SOLN
INTRAVENOUS | Status: DC
  Administered 2018-05-11 – 2018-05-15 (×3): via INTRAVENOUS

## 2018-05-11 MED ORDER — ASPIRIN EC 81 MG PO TBEC: 81.0000 mg | DELAYED_RELEASE_TABLET | Freq: Every day | ORAL | Status: DC

## 2018-05-11 MED ORDER — DOXYCYCLINE HYCLATE 100 MG PO TABS: 100.0000 mg | ORAL_TABLET | Freq: Once | ORAL | Status: DC

## 2018-05-11 MED ORDER — VANCOMYCIN HCL 10 G IV SOLR
1500.0000 mg | Freq: Once | INTRAVENOUS | Status: AC
  Administered 2018-05-11: 1500 mg via INTRAVENOUS
  Filled 2018-05-11 (×2): qty 1500

## 2018-05-11 MED ORDER — ZOLPIDEM TARTRATE 5 MG PO TABS: 5.0000 mg | ORAL_TABLET | Freq: Every evening | ORAL | Status: DC | PRN

## 2018-05-11 MED ORDER — VANCOMYCIN HCL IN DEXTROSE 750-5 MG/150ML-% IV SOLN
750.0000 mg | INTRAVENOUS | Status: DC
  Administered 2018-05-12: 750 mg via INTRAVENOUS
  Filled 2018-05-11: qty 150

## 2018-05-11 MED ORDER — PREDNISONE 5 MG PO TABS
5.0000 mg | ORAL_TABLET | Freq: Every day | ORAL | Status: DC
  Administered 2018-05-12 – 2018-05-16 (×5): 5 mg via ORAL
  Filled 2018-05-11 (×5): qty 1

## 2018-05-11 MED ORDER — LORATADINE 10 MG PO TABS
10.0000 mg | ORAL_TABLET | Freq: Every day | ORAL | Status: DC
  Administered 2018-05-13 – 2018-05-16 (×4): 10 mg via ORAL
  Filled 2018-05-11 (×6): qty 1

## 2018-05-11 MED ORDER — TURMERIC 500 MG PO TABS: ORAL_TABLET | Freq: Every day | ORAL | Status: DC

## 2018-05-11 MED ORDER — ONDANSETRON HCL 4 MG/2ML IJ SOLN
4.0000 mg | Freq: Four times a day (QID) | INTRAMUSCULAR | Status: DC | PRN
Start: 2018-05-11 — End: 2018-05-12

## 2018-05-11 MED ORDER — ACETAMINOPHEN 650 MG RE SUPP: 650.0000 mg | Freq: Four times a day (QID) | RECTAL | Status: DC | PRN

## 2018-05-11 MED ORDER — ACETAMINOPHEN 325 MG PO TABS
650.0000 mg | ORAL_TABLET | Freq: Four times a day (QID) | ORAL | Status: DC | PRN
  Administered 2018-05-13: 650 mg via ORAL
  Filled 2018-05-11: qty 2

## 2018-05-11 MED ORDER — HYDRALAZINE HCL 20 MG/ML IJ SOLN: 5.0000 mg | INTRAMUSCULAR | Status: DC | PRN

## 2018-05-11 MED ORDER — ROSUVASTATIN CALCIUM 10 MG PO TABS
10.0000 mg | ORAL_TABLET | Freq: Every day | ORAL | Status: DC
  Filled 2018-05-11 (×3): qty 1

## 2018-05-11 MED ORDER — ASPIRIN-DIPYRIDAMOLE ER 25-200 MG PO CP12
1.0000 | ORAL_CAPSULE | Freq: Two times a day (BID) | ORAL | Status: DC
Start: 2018-05-11 — End: 2018-05-12
  Administered 2018-05-12: 1 via ORAL
  Filled 2018-05-11 (×3): qty 1

## 2018-05-11 MED ORDER — SODIUM CHLORIDE 0.9 % IV SOLN
1.0000 g | INTRAVENOUS | Status: DC
  Administered 2018-05-12 – 2018-05-14 (×5): 1 g via INTRAVENOUS
  Filled 2018-05-11 (×7): qty 10

## 2018-05-11 MED ORDER — ONDANSETRON HCL 4 MG/2ML IJ SOLN: 4.0000 mg | Freq: Once | INTRAMUSCULAR | Status: DC

## 2018-05-11 MED ORDER — COQ10 50 G PO POWD: Freq: Every day | ORAL | Status: DC

## 2018-05-11 MED ORDER — SENNOSIDES-DOCUSATE SODIUM 8.6-50 MG PO TABS: 1.0000 | ORAL_TABLET | Freq: Every evening | ORAL | Status: DC | PRN

## 2018-05-11 NOTE — ED Provider Notes (Signed)
[facility] EMERGENCY DEPARTMENT Provider Note   CSN: [id number] Arrival date & time: 05/11/18  1505     History   Chief Complaint Chief Complaint  Patient presents with  . Cellulitis    HPI Casey Lee is a 84 y.o. male.  84 yo M with a chief complaint of right knee pain and swelling.  Going on for the past for 5 days.  He thinks it started when he was kneeling and trying to start his hedge trimmer.  The patient since then has not been feeling well.  Is having trouble eating and drinking at home.  Has not had an appetite.  Denies fevers but has had some chills.  The area to the knee is gotten worse.  He saw his family physician who attempted an I&D in the office and was unsuccessful.  He started on Keflex.  He has had about 3 days worth of the Keflex but does not feel that is improved his symptoms at all.  Feels that the redness has spread but just mildly.  He is just felt generally worse.  He has had a mild cough with this as well.  Has felt generally fatigued.  The history is provided by the patient.  Illness  This is a new problem. The current episode started more than 2 days ago. The problem occurs constantly. The problem has been gradually worsening. Pertinent negatives include no chest pain, no abdominal pain, no headaches and no shortness of breath. Nothing aggravates the symptoms. Nothing relieves the symptoms. He has tried nothing for the symptoms. The treatment provided no relief.    Past Medical History:  Diagnosis Date  . Hearing loss   . High cholesterol   . Temporal arteritis ([place])   . TIA (transient ischemic attack)   . UTI (lower urinary tract infection)   . Vertigo     Patient Active Problem List   Diagnosis Date Noted  . Acute renal failure superimposed on stage 3 chronic kidney disease ([place]) 05/11/2018  . Septic prepatellar bursitis of right knee 05/11/2018  . Temporal arteritis ([place])   . HLD (hyperlipidemia) 03/19/2016  . SDH  (subdural hematoma) ([place]) 01/28/2016  . BPPV (benign paroxysmal positional vertigo) 01/28/2016  . Cerebral infarction [facility]) - incidental, punctate R periventricular infarct 01/28/2016  . Essential hypertension 01/28/2016  . Hyperlipidemia LDL goal <70 01/28/2016  . Acute encephalopathy   . Traumatic hemorrhage of cerebrum with loss of consciousness ([place]) - tiny L caudate 01/24/2016    Past Surgical History:  Procedure Laterality Date  . ARTERY BIOPSY Right 04/30/2018   Procedure: BIOPSY TEMPORAL ARTERY;  Surgeon: Izora Gala, MD;  Location: [facility];  Service: ENT;  Laterality: Right;  . none          Home Medications    Prior to Admission medications   Medication Sig Start Date End Date Taking? Authorizing Provider  alendronate (FOSAMAX) 70 MG tablet Take 70 mg by mouth every Monday.  01/12/18  Yes [provider]  aspirin EC 81 MG tablet Take 1 tablet (81 mg total) by mouth daily. Patient taking differently: Take 81 mg by mouth daily at 12 noon.  03/19/16  Yes Rosalin Hawking, MD  cephALEXin (KEFLEX) 500 MG capsule Take 500 mg by mouth 4 (four) times daily. FOR 7 DAYS 05/08/18  Yes [provider]  diphenhydrAMINE (BENADRYL) 25 mg capsule Take 25 mg by mouth daily as needed for itching.    Yes [provider]  dipyridamole-aspirin (AGGRENOX) 200-25 MG 12hr capsule Take 1 capsule by mouth 2 (two) times daily. 05/03/18  Yes [provider]  loratadine (CLARITIN) 10 MG tablet Take 10 mg by mouth daily.    Yes [provider]  predniSONE (DELTASONE) 5 MG tablet Take 5 mg by mouth daily. 11/03/17  Yes [provider]  TURMERIC PO Take 1 capsule by mouth daily.    Yes [provider]  Coenzyme Q10 (COQ10 PO) Take 1 tablet by mouth daily.    [provider]  rosuvastatin (CRESTOR) 10 MG tablet Take 10 mg by mouth daily.  02/16/16   [provider]    Family History Family History  Problem Relation  Age of Onset  . Rheum arthritis Father   . Heart failure Father        Deceased, 34  . Rheum arthritis Sister   . Diabetes Mellitus II Sister   . Healthy Son        x2    Social History Social History   Tobacco Use  . Smoking status: Former Smoker    Types: Cigarettes    Last attempt to quit: 11/07/1952    Years since quitting: 65.5  . Smokeless tobacco: Never Used  Substance Use Topics  . Alcohol use: No    Alcohol/week: 0.0 standard drinks  . Drug use: No     Allergies   Patient has no known allergies.   Review of Systems Review of Systems  Constitutional: Positive for fatigue. Negative for chills and fever.  HENT: Negative for congestion and facial swelling.   Eyes: Negative for discharge and visual disturbance.  Respiratory: Positive for cough. Negative for shortness of breath.   Cardiovascular: Negative for chest pain and palpitations.  Gastrointestinal: Negative for abdominal pain, diarrhea and vomiting.  Musculoskeletal: Negative for arthralgias and myalgias.  Skin: Positive for wound. Negative for color change and rash.  Neurological: Negative for tremors, syncope and headaches.  Psychiatric/Behavioral: Negative for confusion and dysphoric mood.     Physical Exam Updated Vital Signs BP (!) 138/98 (BP Location: Right Arm)   Pulse 100   Temp 98.1 F (36.7 C) (Oral)   Resp 16   Ht 5\' 9"  (1.753 m)   Wt 68.9 kg   SpO2 99%   BMI 22.43 kg/m   Physical Exam  Constitutional: He is oriented to person, place, and time. He appears well-developed and well-nourished.  HENT:  Head: Normocephalic and atraumatic.  Eyes: Pupils are equal, round, and reactive to light. EOM are normal.  Neck: Normal range of motion. Neck supple. No JVD present.  Cardiovascular: Normal rate and regular rhythm. Exam reveals no gallop and no friction rub.  No murmur heard. Pulmonary/Chest: No respiratory distress. He has no wheezes.  Abdominal: He exhibits no distension. There is no  rebound and no guarding.  Musculoskeletal: Normal range of motion.  Erythema and fluctuance to the right inferior knee no pain with ROM of the right knee.   Neurological: He is alert and oriented to person, place, and time.  Skin: No rash noted. No pallor.  Psychiatric: He has a normal mood and affect. His behavior is normal.  Nursing note and vitals reviewed.    ED Treatments / Results  Labs (all labs ordered are listed, but only abnormal results are displayed) Labs Reviewed  COMPREHENSIVE METABOLIC PANEL - Abnormal; Notable for the following components:      Result Value   Sodium 131 (*)    CO2 17 (*)  Glucose, Bld 160 (*)    BUN 37 (*)    Creatinine, Ser 1.72 (*)    Total Protein 5.9 (*)    Albumin 2.9 (*)    GFR calc non Af Amer 34 (*)    GFR calc Af Amer 39 (*)    All other components within normal limits  CBC WITH DIFFERENTIAL/PLATELET - Abnormal; Notable for the following components:   WBC 11.6 (*)    RBC 4.03 (*)    Hemoglobin 12.5 (*)    Neutro Abs 10.7 (*)    Lymphs Abs 0.3 (*)    All other components within normal limits  URINALYSIS, ROUTINE W REFLEX MICROSCOPIC - Abnormal; Notable for the following components:   Color, Urine STRAW (*)    All other components within normal limits  AEROBIC CULTURE (SUPERFICIAL SPECIMEN)  CULTURE, BLOOD (ROUTINE X 2)  CULTURE, BLOOD (ROUTINE X 2)  BASIC METABOLIC PANEL  CBC  SEDIMENTATION RATE  C-REACTIVE PROTEIN  BRAIN NATRIURETIC PEPTIDE  CORTISOL-AM, BLOOD  CREATININE, URINE, RANDOM  SODIUM, URINE, RANDOM  I-STAT CG4 LACTIC ACID, ED  I-STAT CG4 LACTIC ACID, ED    EKG None  Radiology Dg Chest 2 View  Result Date: 05/11/2018 CLINICAL DATA:  Dyspnea on exertion.  Ex-smoker. EXAM: CHEST - 2 VIEW COMPARISON:  01/24/2016. FINDINGS: Normal sized heart. Clear lungs. The lungs are mildly hyperexpanded with mild diffuse prominence of the interstitial markings. Thoracic spine degenerative changes. IMPRESSION: No acute  abnormality.  Mild changes of COPD. Electronically Signed   By: Claudie Revering M.D.   On: 05/11/2018 16:42   Dg Knee Complete 4 Views Right  Result Date: 05/11/2018 CLINICAL DATA:  Right knee redness, warmth and swelling. EXAM: RIGHT KNEE - COMPLETE 4+ VIEW COMPARISON:  None. FINDINGS: Prepatellar soft tissue swelling. Moderate-sized superior patellar enthesophyte. Moderate-sized anterior tibial spine enthesophyte. Medial and lateral meniscal calcification. No effusion. Mild atheromatous arterial calcifications. IMPRESSION: 1. Prepatellar soft tissue swelling.  This could be due to bursitis. 2. No effusion. 3. Chondrocalcinosis. Electronically Signed   By: Claudie Revering M.D.   On: 05/11/2018 16:43    Procedures .Marland KitchenIncision and Drainage Date/Time: 05/11/2018 10:39 PM Performed by: Deno Etienne, DO Authorized by: Deno Etienne, DO   Consent:    Consent obtained:  Verbal   Consent given by:  Patient and spouse   Risks discussed:  Bleeding, incomplete drainage and infection   Alternatives discussed:  Delayed treatment, alternative treatment and observation Location:    Type:  Bursa   Location:  Lower extremity   Lower extremity location:  Knee   Knee location:  R knee Pre-procedure details:    Skin preparation:  Chloraprep Anesthesia (see MAR for exact dosages):    Anesthesia method:  Local infiltration   Local anesthetic:  Lidocaine 2% WITH epi Procedure type:    Complexity:  Complex Procedure details:    Needle aspiration: no     Incision types:  Single straight   Incision depth:  Submucosal   Scalpel blade:  11   Wound management:  Probed and deloculated   Drainage:  Purulent and bloody   Drainage amount:  Copious   Wound treatment:  Wound left open   Packing materials:  None Post-procedure details:    Patient tolerance of procedure:  Tolerated well, no immediate complications   (including critical care time) Emergency Focused Ultrasound Exam Limited Ultrasound of Soft Tissue    Performed and interpreted by Dr. Tyrone Nine Indication: evaluation for infection or foreign body Transverse and Sagittal  views of right knee are obtained in real time for the purposes of evaluation of skin and underlying soft tissues.  Findings:  heterogeneous fluid collection, with hyperemia/edema of surrounding tissue Interpretation:  abscess, with cellulitis Images archived electronically.  CPT Codes:    Lower extremity [phone]     Medications Ordered in ED Medications  ondansetron (ZOFRAN) injection 4 mg (4 mg Intravenous Not Given 05/11/18 2015)  vancomycin (VANCOCIN) 1,500 mg in sodium chloride 0.9 % 500 mL IVPB (has no administration in time range)  acetaminophen (TYLENOL) tablet 650 mg (has no administration in time range)    Or  acetaminophen (TYLENOL) suppository 650 mg (has no administration in time range)  senna-docusate (Senokot-S) tablet 1 tablet (has no administration in time range)  ondansetron (ZOFRAN) tablet 4 mg (has no administration in time range)    Or  ondansetron (ZOFRAN) injection 4 mg (has no administration in time range)  zolpidem (AMBIEN) tablet 5 mg (has no administration in time range)  cefTRIAXone (ROCEPHIN) 1 g in sodium chloride 0.9 % 100 mL IVPB (has no administration in time range)  hydrocortisone sodium succinate (SOLU-CORTEF) 100 MG injection 50 mg (has no administration in time range)  rosuvastatin (CRESTOR) tablet 10 mg (has no administration in time range)  predniSONE (DELTASONE) tablet 5 mg (has no administration in time range)  dipyridamole-aspirin (AGGRENOX) 200-25 MG per 12 hr capsule 1 capsule (has no administration in time range)  loratadine (CLARITIN) tablet 10 mg (has no administration in time range)  aspirin EC tablet 81 mg (has no administration in time range)  0.9 %  sodium chloride infusion (has no administration in time range)  hydrALAZINE (APRESOLINE) injection 5 mg (has no administration in time range)  vancomycin (VANCOCIN)  IVPB 750 mg/150 ml premix (has no administration in time range)  lidocaine-EPINEPHrine (XYLOCAINE W/EPI) 2 %-1:200000 (PF) injection 20 mL (20 mLs Infiltration Given by Other 05/11/18 1932)     Initial Impression / Assessment and Plan / ED Course  I have reviewed the triage vital signs and the nursing notes.  Pertinent labs & imaging results that were available during my care of the patient were reviewed by me and considered in my medical decision making (see chart for details).     84 yo M with a chief complaint of right knee pain and swelling.  He was seen by his family doctor 3 or 4 days ago.  He tried to attempt an I&D and were unsuccessful.  Since then the patient has not felt well has had trouble eating and drinking at home.  Not really had an appetite and had some nausea.  He does have a little bit of a bump in his creatinine and it looks like his baseline is about 1.3 and today is 1.7.  He denies overt fevers but has had some chills.  Exam with a very large fluctuant region to the prepatellar bursa.  He has full range of motion of the knee without pain.  Bedside ultrasound consistent with prepatellar bursitis.  I&D was performed with a large amount of purulent drainage.  As the patient has not done well at home and is failed outpatient therapy will discuss with the hospitalist for admission.   The patients results and plan were reviewed and discussed.   Any x-rays performed were independently reviewed by myself.   Differential diagnosis were considered with the presenting HPI.  Medications  ondansetron (ZOFRAN) injection 4 mg (4 mg Intravenous Not Given 05/11/18 2015)  vancomycin (VANCOCIN) 1,500 mg  in sodium chloride 0.9 % 500 mL IVPB (has no administration in time range)  acetaminophen (TYLENOL) tablet 650 mg (has no administration in time range)    Or  acetaminophen (TYLENOL) suppository 650 mg (has no administration in time range)  senna-docusate (Senokot-S) tablet 1 tablet (has no  administration in time range)  ondansetron (ZOFRAN) tablet 4 mg (has no administration in time range)    Or  ondansetron (ZOFRAN) injection 4 mg (has no administration in time range)  zolpidem (AMBIEN) tablet 5 mg (has no administration in time range)  cefTRIAXone (ROCEPHIN) 1 g in sodium chloride 0.9 % 100 mL IVPB (has no administration in time range)  hydrocortisone sodium succinate (SOLU-CORTEF) 100 MG injection 50 mg (has no administration in time range)  rosuvastatin (CRESTOR) tablet 10 mg (has no administration in time range)  predniSONE (DELTASONE) tablet 5 mg (has no administration in time range)  dipyridamole-aspirin (AGGRENOX) 200-25 MG per 12 hr capsule 1 capsule (has no administration in time range)  loratadine (CLARITIN) tablet 10 mg (has no administration in time range)  aspirin EC tablet 81 mg (has no administration in time range)  0.9 %  sodium chloride infusion (has no administration in time range)  hydrALAZINE (APRESOLINE) injection 5 mg (has no administration in time range)  vancomycin (VANCOCIN) IVPB 750 mg/150 ml premix (has no administration in time range)  lidocaine-EPINEPHrine (XYLOCAINE W/EPI) 2 %-1:200000 (PF) injection 20 mL (20 mLs Infiltration Given by Other 05/11/18 1932)    Vitals:   05/11/18 1930 05/11/18 2030 05/11/18 2041 05/11/18 2117  BP: 138/78  116/80 (!) 138/98  Pulse: (!) 104 96 (!) 104 100  Resp:    16  Temp:    98.1 F (36.7 C)  TempSrc:    Oral  SpO2: 99% 98% 97% 99%  Weight:      Height:        Final diagnoses:  Prepatellar bursitis of right knee    Admission/ observation were discussed with the admitting physician, patient and/or family and they are comfortable with the plan.    Final Clinical Impressions(s) / ED Diagnoses   Final diagnoses:  Prepatellar bursitis of right knee    ED Discharge Orders    None       Deno Etienne, DO 05/11/18 2240

## 2018-05-11 NOTE — H&P (Signed)
History and Physical    Casey Lee UTM:[id number] DOB: Aug 20, 1928 DOA: 05/11/2018  Referring MD/NP/PA:   PCP: [name], MD   Patient coming from:  The patient is coming from home.  At baseline, pt is independent for most of ADL.  Chief Complaint: right knee pain  HPI: Casey Lee is a 84 y.o. male with medical history significant of hyperlipidemia, stroke, SDH, temporal artery arteritis, vertigo, CKD-3, who presents with right knee pain.  Pt states that he has been having right knee pain for about 1 week, which is worsening past 3 days.  The right knee is also swelling, erythema and warmth. He thinks it started when he was kneeling and trying to start his hedge trimmer. The pain is constant, sharp, 10 out of 10 in severity, nonradiating.  Patient denies fever or chills. He saw his family physician who attempted an I&D in the office and was unsuccessful 3 days ago.  He was started on Keflex.  He has took Keflex about 3 days without any improvement.  Patient has some mild dry cough, but no chest pain or shortness of breath.  Patient denies nausea vomiting, diarrhea, abdominal pain, symptoms of UTI or unilateral weakness.  He has generalized weakness.  Patient states that he has bruise in left arm that is because he had blood pressure cuff on for 2 hours in doctor's office 3 days ago. He stopped taking plavix.  ED Course: pt was found to have WBC 11.6, lactic acid of 1.75, worsening renal function, temperature normal, heart rate in 90s, no tachypnea, oxygen saturation 99% on room air.  Chest x-ray is negative for infiltration, showed COPD change. X-ray of R knee showed prepatellar soft tissue swelling. Pt is placed on Med-surg bed for obs. I&d was performed by EDP.   Review of Systems:   General: no fevers, chills, no body weight gain, has poor appetite, has fatigue HEENT: no blurry vision, hearing changes or sore throat Respiratory: no dyspnea, has mild dry coughing, no  wheezing CV: no chest pain, no palpitations GI: no nausea, vomiting, abdominal pain, diarrhea, constipation GU: no dysuria, burning on urination, increased urinary frequency, hematuria  Ext: no leg edema Neuro: no unilateral weakness, numbness, or tingling, no vision change or hearing loss Skin: the right knee is swelling, erythematous and tenderness. Has bruises in left arm MSK: No muscle spasm, no deformity, no limitation of range of movement in spin headache. Has R knee pain. Heme: No easy bruising.  Travel history: No recent long distant travel.  Allergy: No Known Allergies  Past Medical History:  Diagnosis Date  . Hearing loss   . High cholesterol   . Temporal arteritis ([place])   . TIA (transient ischemic attack)   . UTI (lower urinary tract infection)   . Vertigo     Past Surgical History:  Procedure Laterality Date  . ARTERY BIOPSY Right 04/30/2018   Procedure: BIOPSY TEMPORAL ARTERY;  Surgeon: Izora Gala, MD;  Location: [facility];  Service: ENT;  Laterality: Right;  . none      Social History:  reports that he quit smoking about 65 years ago. His smoking use included cigarettes. He has never used smokeless tobacco. He reports that he does not drink alcohol or use drugs.  Family History:  Family History  Problem Relation Age of Onset  . Rheum arthritis Father   . Heart failure Father        Deceased, 62  . Rheum arthritis Sister   .  Diabetes Mellitus II Sister   . Healthy Son        x2     Prior to Admission medications   Medication Sig Start Date End Date Taking? Authorizing Provider  alendronate (FOSAMAX) 70 MG tablet Take 70 mg by mouth once a week. On Monday 01/12/18   [provider]  aspirin EC 81 MG tablet Take 1 tablet (81 mg total) by mouth daily. Patient taking differently: Take 81 mg by mouth daily at 12 noon.  03/19/16   Rosalin Hawking, MD  clopidogrel (PLAVIX) 75 MG tablet Take 75 mg by mouth every evening.  09/08/16   [provider]  Coenzyme Q10 (COQ10 PO) Take 1 tablet by mouth daily.    [provider]  loratadine (CLARITIN) 10 MG tablet Take 10 mg by mouth daily.     [provider]  predniSONE (DELTASONE) 5 MG tablet Take 5 mg by mouth daily. 11/03/17   [provider]  rosuvastatin (CRESTOR) 10 MG tablet Take 10 mg by mouth daily.  02/16/16   [provider]  TURMERIC PO Take 1 tablet by mouth daily.    [provider]    Physical Exam: Vitals:   05/11/18 2041 05/11/18 2117 05/11/18 2300 05/12/18 0435  BP: 116/80 (!) 138/98  120/72  Pulse: (!) 104 100  (!) 46  Resp:  16  16  Temp:  98.1 F (36.7 C)  97.9 F (36.6 C)  TempSrc:  Oral  Oral  SpO2: 97% 99%  98%  Weight:   67.9 kg   Height:       General: Not in acute distress HEENT:       Eyes: PERRL, EOMI, no scleral icterus.       ENT: No discharge from the ears and nose, no pharynx injection, no tonsillar enlargement.        Neck: No JVD, no bruit, no mass felt. Heme: No neck lymph node enlargement. Cardiac: S1/S2, RRR, No murmurs, No gallops or rubs. Respiratory: No rales, wheezing, rhonchi or rubs. GI: Soft, nondistended, nontender, no rebound pain, no organomegaly, BS present. GU: No hematuria Ext: No pitting leg edema bilaterally. 2+DP/PT pulse bilaterally. Musculoskeletal: No joint deformities, No joint redness or warmth, no limitation of ROM in spin. Skin: the right knee is swelling, erythematous and tenderness. Has bruises in left arm Neuro: Alert, oriented X3, cranial nerves II-XII grossly intact, moves all extremities normally. Psych: Patient is not psychotic, no suicidal or hemocidal ideation.  Labs on Admission: I have personally reviewed following labs and imaging studies  CBC: Recent Labs  Lab 05/11/18 1537 05/12/18 0410  WBC 11.6* 8.1  NEUTROABS 10.7*  --   HGB 12.5* 10.4*  HCT 40.0 32.3*  MCV 99.3 96.1  PLT PLATELET CLUMPS NOTED ON SMEAR, UNABLE TO ESTIMATE 096    Basic Metabolic Panel: Recent Labs  Lab 05/11/18 1537 05/12/18 0410  NA 131* 135  K 4.6 4.3  CL 103 107  CO2 17* 20*  GLUCOSE 160* 178*  BUN 37* 31*  CREATININE 1.72* 1.42*  CALCIUM 8.9 8.0*   GFR: Estimated Creatinine Clearance: 35.2 mL/min (A) (by C-G formula based on SCr of 1.42 mg/dL (H)). Liver Function Tests: Recent Labs  Lab 05/11/18 1537  AST 26  ALT 30  ALKPHOS 89  BILITOT 1.2  PROT 5.9*  ALBUMIN 2.9*   No results for input(s): LIPASE, AMYLASE in the last 168 hours. No results for input(s): AMMONIA in the last 168 hours. Coagulation Profile:  No results for input(s): INR, PROTIME in the last 168 hours. Cardiac Enzymes: No results for input(s): CKTOTAL, CKMB, CKMBINDEX, TROPONINI in the last 168 hours. BNP (last 3 results) No results for input(s): PROBNP in the last 8760 hours. HbA1C: No results for input(s): HGBA1C in the last 72 hours. CBG: No results for input(s): GLUCAP in the last 168 hours. Lipid Profile: No results for input(s): CHOL, HDL, LDLCALC, TRIG, CHOLHDL, LDLDIRECT in the last 72 hours. Thyroid Function Tests: No results for input(s): TSH, T4TOTAL, FREET4, T3FREE, THYROIDAB in the last 72 hours. Anemia Panel: No results for input(s): VITAMINB12, FOLATE, FERRITIN, TIBC, IRON, RETICCTPCT in the last 72 hours. Urine analysis:    Component Value Date/Time   COLORURINE STRAW (A) 05/11/2018 2000   APPEARANCEUR CLEAR 05/11/2018 2000   LABSPEC 1.005 05/11/2018 2000   PHURINE 6.0 05/11/2018 2000   GLUCOSEU NEGATIVE 05/11/2018 2000   HGBUR NEGATIVE 05/11/2018 2000   BILIRUBINUR NEGATIVE 05/11/2018 2000   KETONESUR NEGATIVE 05/11/2018 2000   PROTEINUR NEGATIVE 05/11/2018 2000   NITRITE NEGATIVE 05/11/2018 2000   LEUKOCYTESUR NEGATIVE 05/11/2018 2000   Sepsis Labs: @LABRCNTIP (procalcitonin:4,lacticidven:4) ) Recent Results (from the past 240 hour(s))  Wound or Superficial Culture     Status: None (Preliminary result)   Collection Time:  05/11/18  7:28 PM  Result Value Ref Range Status   Specimen Description KNEE BURSA  Final   Special Requests   Final    Normal Performed at [facility], [place] [address] [place], [place] [address]    Gram Stain NO WBC SEEN RARE GRAM POSITIVE COCCI   Final   Culture PENDING  Incomplete   Report Status PENDING  Incomplete     Radiological Exams on Admission: Dg Chest 2 View  Result Date: 05/11/2018 CLINICAL DATA:  Dyspnea on exertion.  Ex-smoker. EXAM: CHEST - 2 VIEW COMPARISON:  01/24/2016. FINDINGS: Normal sized heart. Clear lungs. The lungs are mildly hyperexpanded with mild diffuse prominence of the interstitial markings. Thoracic spine degenerative changes. IMPRESSION: No acute abnormality.  Mild changes of COPD. Electronically Signed   By: Claudie Revering M.D.   On: 05/11/2018 16:42   Dg Knee Complete 4 Views Right  Result Date: 05/11/2018 CLINICAL DATA:  Right knee redness, warmth and swelling. EXAM: RIGHT KNEE - COMPLETE 4+ VIEW COMPARISON:  None. FINDINGS: Prepatellar soft tissue swelling. Moderate-sized superior patellar enthesophyte. Moderate-sized anterior tibial spine enthesophyte. Medial and lateral meniscal calcification. No effusion. Mild atheromatous arterial calcifications. IMPRESSION: 1. Prepatellar soft tissue swelling.  This could be due to bursitis. 2. No effusion. 3. Chondrocalcinosis. Electronically Signed   By: Claudie Revering M.D.   On: 05/11/2018 16:43     EKG:  Not done in ED, will get one.   Assessment/Plan Principal Problem:   Septic prepatellar bursitis of right knee Active Problems:   Cerebral infarction (HCC) - incidental, punctate R periventricular infarct   HLD (hyperlipidemia)   Acute renal failure superimposed on stage 3 chronic kidney disease (HCC)   Temporal arteritis (HCC)   Septic prepatellar bursitis of right knee: s/p of I&d in ED. Not septic.  Hemodynamically stable. - will place on med-surg for obs - Empiric antimicrobial treatment with  vancomycin and Rocephin - Blood cultures x 2  - ESR and CRP - will get Procalcitonin and trend lactic acid levels per sepsis protocol. - IVF: NS 100 75 cc/h - pt refused Narcotics and cannot use NSAIDs due to CKD-III, will give prn tylenol for pain - may need to consult  ortho in AM  Hx of Cerebral infarction ([place]) - incidental, punctate R periventricular infarct: -ASA and Crestor  HLD (hyperlipidemia): -Crestor  Acute renal failure superimposed on stage 3 chronic kidney disease ([place]): Baseline creatinine 1.18 on 01/28/2016, his creatinine is 1.72, BUN 37, not sure if this is acute change or progression of CKD 3 -Avoid renal toxic medications, such as NSAIDs - IV fluid as above, -Follow-up renal function BP map  Temporal arteritis ([place]):  Pt is on prednisone tapering, currently 5 mg daily -Gave 1 dose of Solu-Cortef, 50 mg -Continue home prednisone   DVT ppx: SCD Code Status: Full code Family Communication: None at bed side.     Disposition Plan:  Anticipate discharge back to previous home environment Consults called:  none Admission status: medical floor/obs    Date of Service 05/12/2018    Ivor Costa [facility] Pager [phone]  If 7PM-7AM, please contact night-coverage [email or web address] Password [facility] 05/12/2018, 5:37 AM

## 2018-05-11 NOTE — Progress Notes (Signed)
PHARMACIST - PHYSICIAN ORDER COMMUNICATION  CONCERNING: P&T Medication Policy on Herbal Medications  DESCRIPTION:  This patient's order for:  Co Q 10 and tumeric  has been noted.  This product(s) is classified as an "herbal" or natural product. Due to a lack of definitive safety studies or FDA approval, nonstandard manufacturing practices, plus the potential risk of unknown drug-drug interactions while on inpatient medications, the Pharmacy and Therapeutics Committee does not permit the use of "herbal" or natural products of this type within [facility].   ACTION TAKEN: The pharmacy department is unable to verify this order at this time and your patient has been informed of this safety policy. Please reevaluate patient's clinical condition at discharge and address if the herbal or natural product(s) should be resumed at that time.   Darcelle Herrada A. Levada Dy, PharmD, [place] Pager: [phone] Please utilize Amion for appropriate phone number to reach the unit pharmacist ([place])

## 2018-05-11 NOTE — ED Triage Notes (Addendum)
Pt with R knee redness, warmth and swelling.  Pt has been taken 6 pills of keflex and the infection is getting worse.  Also c/o weakness and and dyspnea on exertion.  Afebrile.  Pt last took 200mg  advil at 1300.

## 2018-05-11 NOTE — Progress Notes (Addendum)
ANTIBIOTIC CONSULT NOTE - INITIAL  Pharmacy Consult for Vancomycin Indication: Right knee prepatellar bursitis with infection  No Known Allergies  Patient Measurements: Height: 5\' 9"  (175.3 cm) Weight: 151 lb 14.4 oz (68.9 kg) IBW/kg (Calculated) : 70.7   Vital Signs: Temp: 98.1 F (36.7 C) (08/09 2117) Temp Source: Oral (08/09 2117) BP: 138/98 (08/09 2117) Pulse Rate: 100 (08/09 2117) Intake/Output from previous day: No intake/output data recorded. Intake/Output from this shift: No intake/output data recorded.  Labs: Recent Labs    05/11/18 1537  WBC 11.6*  HGB 12.5*  PLT PLATELET CLUMPS NOTED ON SMEAR, UNABLE TO ESTIMATE  CREATININE 1.72*   Estimated Creatinine Clearance: 29.5 mL/min (A) (by C-G formula based on SCr of 1.72 mg/dL (H)). No results for input(s): VANCOTROUGH, VANCOPEAK, VANCORANDOM, GENTTROUGH, GENTPEAK, GENTRANDOM, TOBRATROUGH, TOBRAPEAK, TOBRARND, AMIKACINPEAK, AMIKACINTROU, AMIKACIN in the last 72 hours.   Microbiology: No results found for this or any previous visit (from the past 720 hour(s)).  Medical History: Past Medical History:  Diagnosis Date  . Hearing loss   . High cholesterol   . Temporal arteritis ([place])   . TIA (transient ischemic attack)   . UTI (lower urinary tract infection)   . Vertigo      Assessment: 84 yo male pt with R knee redness, warmth and swelling. Pharmacy has been asked to dose vancomycin.   Goal of Therapy:  Vancomycin trough level 15-20 mcg/ml  Plan:  Vancomycin 1500mg  IV x 1, then 750mg  IV q24h Monitor for clinical course, LOT, renal function and fever curve.   Mycah Formica A. Levada Dy, PharmD, [place] Pager: [phone] Please utilize Amion for appropriate phone number to reach the unit pharmacist ([place])   05/11/2018,9:22 PM

## 2018-05-12 ENCOUNTER — Encounter (HOSPITAL_COMMUNITY)
Admission: EM | Disposition: A | Discharge status: Home Health | Payer: Self-pay | Source: Home / Self Care | Attending: Family Medicine

## 2018-05-12 ENCOUNTER — Encounter: Payer: Self-pay | Admitting: Physician Assistant

## 2018-05-12 ENCOUNTER — Observation Stay (HOSPITAL_COMMUNITY): Payer: Medicare Other | Admitting: Certified Registered"

## 2018-05-12 DIAGNOSIS — I639 Cerebral infarction, unspecified: Secondary | ICD-10-CM | POA: Diagnosis not present

## 2018-05-12 DIAGNOSIS — I480 Paroxysmal atrial fibrillation: Secondary | ICD-10-CM | POA: Diagnosis present

## 2018-05-12 DIAGNOSIS — I130 Hypertensive heart and chronic kidney disease with heart failure and stage 1 through stage 4 chronic kidney disease, or unspecified chronic kidney disease: Secondary | ICD-10-CM | POA: Diagnosis not present

## 2018-05-12 DIAGNOSIS — M316 Other giant cell arteritis: Secondary | ICD-10-CM | POA: Diagnosis not present

## 2018-05-12 DIAGNOSIS — M71161 Other infective bursitis, right knee: Secondary | ICD-10-CM | POA: Diagnosis not present

## 2018-05-12 DIAGNOSIS — Z7982 Long term (current) use of aspirin: Secondary | ICD-10-CM | POA: Diagnosis not present

## 2018-05-12 DIAGNOSIS — Z7902 Long term (current) use of antithrombotics/antiplatelets: Secondary | ICD-10-CM | POA: Diagnosis not present

## 2018-05-12 DIAGNOSIS — J449 Chronic obstructive pulmonary disease, unspecified: Secondary | ICD-10-CM | POA: Diagnosis not present

## 2018-05-12 DIAGNOSIS — E785 Hyperlipidemia, unspecified: Secondary | ICD-10-CM | POA: Diagnosis not present

## 2018-05-12 DIAGNOSIS — I483 Typical atrial flutter: Secondary | ICD-10-CM

## 2018-05-12 DIAGNOSIS — M7041 Prepatellar bursitis, right knee: Secondary | ICD-10-CM | POA: Diagnosis not present

## 2018-05-12 DIAGNOSIS — M25561 Pain in right knee: Secondary | ICD-10-CM | POA: Diagnosis present

## 2018-05-12 DIAGNOSIS — N179 Acute kidney failure, unspecified: Secondary | ICD-10-CM | POA: Diagnosis not present

## 2018-05-12 DIAGNOSIS — I5033 Acute on chronic diastolic (congestive) heart failure: Secondary | ICD-10-CM | POA: Diagnosis not present

## 2018-05-12 DIAGNOSIS — Z8673 Personal history of transient ischemic attack (TIA), and cerebral infarction without residual deficits: Secondary | ICD-10-CM | POA: Diagnosis not present

## 2018-05-12 DIAGNOSIS — I4892 Unspecified atrial flutter: Secondary | ICD-10-CM | POA: Diagnosis not present

## 2018-05-12 DIAGNOSIS — H9190 Unspecified hearing loss, unspecified ear: Secondary | ICD-10-CM | POA: Diagnosis not present

## 2018-05-12 DIAGNOSIS — I63311 Cerebral infarction due to thrombosis of right middle cerebral artery: Secondary | ICD-10-CM | POA: Diagnosis not present

## 2018-05-12 DIAGNOSIS — I129 Hypertensive chronic kidney disease with stage 1 through stage 4 chronic kidney disease, or unspecified chronic kidney disease: Secondary | ICD-10-CM | POA: Diagnosis not present

## 2018-05-12 DIAGNOSIS — N183 Chronic kidney disease, stage 3 (moderate): Secondary | ICD-10-CM | POA: Diagnosis not present

## 2018-05-12 DIAGNOSIS — Z79899 Other long term (current) drug therapy: Secondary | ICD-10-CM | POA: Diagnosis not present

## 2018-05-12 DIAGNOSIS — E7800 Pure hypercholesterolemia, unspecified: Secondary | ICD-10-CM | POA: Diagnosis present

## 2018-05-12 DIAGNOSIS — Z87891 Personal history of nicotine dependence: Secondary | ICD-10-CM | POA: Diagnosis not present

## 2018-05-12 DIAGNOSIS — Z7952 Long term (current) use of systemic steroids: Secondary | ICD-10-CM | POA: Diagnosis not present

## 2018-05-12 HISTORY — PX: I & D EXTREMITY: SHX5045

## 2018-05-12 LAB — BASIC METABOLIC PANEL
Anion gap: 8 (ref 5–15)
BUN: 31 mg/dL — ABNORMAL HIGH (ref 8–23)
CHLORIDE: 107 mmol/L (ref 98–111)
CO2: 20 mmol/L — AB (ref 22–32)
CREATININE: 1.42 mg/dL — AB (ref 0.61–1.24)
Calcium: 8 mg/dL — ABNORMAL LOW (ref 8.9–10.3)
GFR calc Af Amer: 50 mL/min — ABNORMAL LOW (ref >60)
GFR calc non Af Amer: 43 mL/min — ABNORMAL LOW (ref >60)
GLUCOSE: 178 mg/dL — AB (ref 70–99)
Potassium: 4.3 mmol/L (ref 3.5–5.1)
Sodium: 135 mmol/L (ref 135–145)

## 2018-05-12 LAB — CBC
HCT: 32.3 % — ABNORMAL LOW (ref 39.0–52.0)
Hemoglobin: 10.4 g/dL — ABNORMAL LOW (ref 13.0–17.0)
MCH: 31 pg (ref 26.0–34.0)
MCHC: 32.2 g/dL (ref 30.0–36.0)
MCV: 96.1 fL (ref 78.0–100.0)
Platelets: 150 10*3/uL (ref 150–400)
RBC: 3.36 MIL/uL — ABNORMAL LOW (ref 4.22–5.81)
RDW: 14.6 % (ref 11.5–15.5)
WBC: 8.1 10*3/uL (ref 4.0–10.5)

## 2018-05-12 LAB — CORTISOL-AM, BLOOD: CORTISOL - AM: 41.4 ug/dL — AB (ref 6.7–22.6)

## 2018-05-12 SURGERY — IRRIGATION AND DEBRIDEMENT EXTREMITY
Anesthesia: General | Site: Knee | Laterality: Right

## 2018-05-12 MED ORDER — CHLORHEXIDINE GLUCONATE 4 % EX LIQD
60.0000 mL | Freq: Once | CUTANEOUS | Status: DC
  Administered 2018-05-12: 4 via TOPICAL

## 2018-05-12 MED ORDER — DEXAMETHASONE SODIUM PHOSPHATE 10 MG/ML IJ SOLN
INTRAMUSCULAR | Status: DC | PRN
  Administered 2018-05-12: 5 mg via INTRAVENOUS

## 2018-05-12 MED ORDER — FENTANYL CITRATE (PF) 250 MCG/5ML IJ SOLN
INTRAMUSCULAR | Status: DC | PRN
  Administered 2018-05-12 (×2): 25 ug via INTRAVENOUS
  Administered 2018-05-12: 50 ug via INTRAVENOUS

## 2018-05-12 MED ORDER — PROPOFOL 10 MG/ML IV BOLUS
INTRAVENOUS | Status: DC | PRN
  Administered 2018-05-12: 20 mg via INTRAVENOUS
  Administered 2018-05-12: 30 mg via INTRAVENOUS
  Administered 2018-05-12: 90 mg via INTRAVENOUS

## 2018-05-12 MED ORDER — 0.9 % SODIUM CHLORIDE (POUR BTL) OPTIME
TOPICAL | Status: DC | PRN
  Administered 2018-05-12: 1000 mL

## 2018-05-12 MED ORDER — FENTANYL CITRATE (PF) 100 MCG/2ML IJ SOLN
25.0000 ug | INTRAMUSCULAR | Status: DC | PRN
  Administered 2018-05-12 (×2): 50 ug via INTRAVENOUS

## 2018-05-12 MED ORDER — ONDANSETRON HCL 4 MG/2ML IJ SOLN
INTRAMUSCULAR | Status: AC
  Filled 2018-05-12: qty 2

## 2018-05-12 MED ORDER — ONDANSETRON HCL 4 MG/2ML IJ SOLN
INTRAMUSCULAR | Status: DC | PRN
  Administered 2018-05-12: 4 mg via INTRAVENOUS

## 2018-05-12 MED ORDER — SODIUM CHLORIDE 0.9 % IR SOLN
Status: DC | PRN
  Administered 2018-05-12: 6000 mL

## 2018-05-12 MED ORDER — LACTATED RINGERS IV SOLN
INTRAVENOUS | Status: DC
  Administered 2018-05-12: 14:00:00 via INTRAVENOUS

## 2018-05-12 MED ORDER — POVIDONE-IODINE 10 % EX SWAB
2.0000 "application " | Freq: Once | CUTANEOUS | Status: DC
  Administered 2018-05-12: 2 via TOPICAL

## 2018-05-12 MED ORDER — PHENYLEPHRINE HCL 10 MG/ML IJ SOLN
INTRAMUSCULAR | Status: DC | PRN
  Administered 2018-05-12 (×2): 120 ug via INTRAVENOUS
  Administered 2018-05-12: 80 ug via INTRAVENOUS

## 2018-05-12 MED ORDER — PROPOFOL 10 MG/ML IV BOLUS
INTRAVENOUS | Status: AC
  Filled 2018-05-12: qty 20

## 2018-05-12 MED ORDER — APIXABAN 5 MG PO TABS
5.0000 mg | ORAL_TABLET | Freq: Two times a day (BID) | ORAL | Status: DC
  Administered 2018-05-13 – 2018-05-16 (×7): 5 mg via ORAL
  Filled 2018-05-12 (×7): qty 1

## 2018-05-12 MED ORDER — LIDOCAINE 2% (20 MG/ML) 5 ML SYRINGE
INTRAMUSCULAR | Status: AC
  Filled 2018-05-12: qty 5

## 2018-05-12 MED ORDER — FENTANYL CITRATE (PF) 250 MCG/5ML IJ SOLN
INTRAMUSCULAR | Status: AC
  Filled 2018-05-12: qty 5

## 2018-05-12 MED ORDER — FENTANYL CITRATE (PF) 100 MCG/2ML IJ SOLN
INTRAMUSCULAR | Status: AC
  Administered 2018-05-12: 50 ug via INTRAVENOUS
  Filled 2018-05-12: qty 2

## 2018-05-12 SURGICAL SUPPLY — 40 items
BANDAGE ACE 4X5 VEL STRL LF (GAUZE/BANDAGES/DRESSINGS) ×3 IMPLANT
BANDAGE ACE 6X5 VEL STRL LF (GAUZE/BANDAGES/DRESSINGS) ×3 IMPLANT
BNDG COHESIVE 4X5 TAN STRL (GAUZE/BANDAGES/DRESSINGS) ×3 IMPLANT
BNDG GAUZE ELAST 4 BULKY (GAUZE/BANDAGES/DRESSINGS) ×3 IMPLANT
BOOTCOVER CLEANROOM LRG (PROTECTIVE WEAR) ×6 IMPLANT
COVER SURGICAL LIGHT HANDLE (MISCELLANEOUS) ×3 IMPLANT
CUFF TOURNIQUET SINGLE 34IN LL (TOURNIQUET CUFF) IMPLANT
DURAPREP 26ML APPLICATOR (WOUND CARE) ×3 IMPLANT
ELECT REM PT RETURN 9FT ADLT (ELECTROSURGICAL)
ELECTRODE REM PT RTRN 9FT ADLT (ELECTROSURGICAL) IMPLANT
EVACUATOR 1/8 PVC DRAIN (DRAIN) IMPLANT
GAUZE IODOFORM PACK 1/2 7832 (GAUZE/BANDAGES/DRESSINGS) ×3 IMPLANT
GAUZE SPONGE 4X4 12PLY STRL (GAUZE/BANDAGES/DRESSINGS) ×3 IMPLANT
GAUZE XEROFORM 1X8 LF (GAUZE/BANDAGES/DRESSINGS) ×3 IMPLANT
GLOVE BIOGEL PI ORTHO PRO SZ8 (GLOVE) ×2
GLOVE ORTHO TXT STRL SZ7.5 (GLOVE) ×3 IMPLANT
GLOVE PI ORTHO PRO STRL SZ8 (GLOVE) ×1 IMPLANT
GLOVE SURG ORTHO 8.0 STRL STRW (GLOVE) ×6 IMPLANT
GOWN STRL REUS W/ TWL LRG LVL3 (GOWN DISPOSABLE) IMPLANT
GOWN STRL REUS W/TWL LRG LVL3 (GOWN DISPOSABLE)
HANDPIECE INTERPULSE COAX TIP (DISPOSABLE)
KIT BASIN OR (CUSTOM PROCEDURE TRAY) ×3 IMPLANT
KIT TURNOVER KIT B (KITS) ×3 IMPLANT
MANIFOLD NEPTUNE II (INSTRUMENTS) ×3 IMPLANT
NS IRRIG 1000ML POUR BTL (IV SOLUTION) ×3 IMPLANT
PACK ORTHO EXTREMITY (CUSTOM PROCEDURE TRAY) ×3 IMPLANT
PAD ABD 8X10 STRL (GAUZE/BANDAGES/DRESSINGS) ×3 IMPLANT
PAD ARMBOARD 7.5X6 YLW CONV (MISCELLANEOUS) ×6 IMPLANT
SET HNDPC FAN SPRY TIP SCT (DISPOSABLE) IMPLANT
SPONGE LAP 18X18 X RAY DECT (DISPOSABLE) ×3 IMPLANT
STOCKINETTE IMPERVIOUS 9X36 MD (GAUZE/BANDAGES/DRESSINGS) ×3 IMPLANT
SUT ETHILON 3 0 PS 1 (SUTURE) IMPLANT
SWAB CULTURE ESWAB REG 1ML (MISCELLANEOUS) IMPLANT
TOWEL OR 17X24 6PK STRL BLUE (TOWEL DISPOSABLE) ×3 IMPLANT
TOWEL OR 17X26 10 PK STRL BLUE (TOWEL DISPOSABLE) ×3 IMPLANT
TUBE CONNECTING 12'X1/4 (SUCTIONS) ×1
TUBE CONNECTING 12X1/4 (SUCTIONS) ×2 IMPLANT
UNDERPAD 30X30 (UNDERPADS AND DIAPERS) ×3 IMPLANT
WATER STERILE IRR 1000ML POUR (IV SOLUTION) ×3 IMPLANT
YANKAUER SUCT BULB TIP NO VENT (SUCTIONS) ×3 IMPLANT

## 2018-05-12 NOTE — Plan of Care (Signed)
  Problem: Education: Goal: Knowledge of General Education information will improve Description Including pain rating scale, medication(s)/side effects and non-pharmacologic comfort measures Outcome: Progressing Note:  POC and orders reviewd with pt.

## 2018-05-12 NOTE — Consult Note (Signed)
ORTHOPAEDIC CONSULTATION  REQUESTING PHYSICIAN: Oswald Hillock, MD     Chief Complaint: Right knee pain  HPI: Casey Lee is a 84 y.o. male who complains of right knee pain and swelling for the last 2 weeks or so.  He was kneeling down on his knee, had pants on, but developed swelling.  It is progressively gotten worse.  Seen in the emergency room last night, had what looks like a very small I&D, and this has demonstrated gram-positive cocci.  He has been able to move his knee, he initially wanted to go home and have this managed as an outpatient, however he is currently on IV antibiotics, and was told to come to the emergency room by his primary doctor when the Keflex he was taking did not improve his swelling and pain.  Movement makes it worse, resting it makes it better.  He denies any other issues with the exception of acute bruising on his left upper extremity, with hemorrhage and ecchymosis that is basically from his mid brachium distal, this was after he had a procedure for the work-up of temporal arteritis, and a blood pressure cuff that was on for 2 hours, cycling, and caused the significant bleeding into the arm.  This is all thought to be as a complication from his anticoagulation.  Past Medical History:  Diagnosis Date  . Hearing loss   . High cholesterol   . Temporal arteritis ([place])   . TIA (transient ischemic attack)   . UTI (lower urinary tract infection)   . Vertigo    Past Surgical History:  Procedure Laterality Date  . ARTERY BIOPSY Right 04/30/2018   Procedure: BIOPSY TEMPORAL ARTERY;  Surgeon: Izora Gala, MD;  Location: [facility];  Service: ENT;  Laterality: Right;  . none     Social History   Socioeconomic History  . Marital status: Married    Spouse name: Not on file  . Number of children: Not on file  . Years of education: Not on file  . Highest education level: Not on file  Occupational History  . Not on file  Social Needs  .  Financial resource strain: Not on file  . Food insecurity:    Worry: Not on file    Inability: Not on file  . Transportation needs:    Medical: Not on file    Non-medical: Not on file  Tobacco Use  . Smoking status: Former Smoker    Types: Cigarettes    Last attempt to quit: 11/07/1952    Years since quitting: 65.5  . Smokeless tobacco: Never Used  Substance and Sexual Activity  . Alcohol use: No    Alcohol/week: 0.0 standard drinks  . Drug use: No  . Sexual activity: Not on file  Lifestyle  . Physical activity:    Days per week: Not on file    Minutes per session: Not on file  . Stress: Not on file  Relationships  . Social connections:    Talks on phone: Not on file    Gets together: Not on file    Attends religious service: Not on file    Active member of club or organization: Not on file    Attends meetings of clubs or organizations: Not on file    Relationship status: Not on file  Other Topics Concern  . Not on file  Social History Narrative   Lives with wife in split level home.  Has 2 sons.     Retired  pharmaceutical rep.     Currently works as a Engineer, site.   Family History  Problem Relation Age of Onset  . Rheum arthritis Father   . Heart failure Father        Deceased, 81  . Rheum arthritis Sister   . Diabetes Mellitus II Sister   . Healthy Son        x2   No Known Allergies   Positive ROS: All other systems have been reviewed and were otherwise negative with the exception of those mentioned in the HPI and as above.  Physical Exam: General: Alert, no acute distress Cardiovascular: 2+ pedal edema on the right lower extremity, as well as to some degree on the left lower extremity. Respiratory: No cyanosis, no use of accessory musculature GI: No organomegaly, abdomen is soft and non-tender Skin: Diffuse erythema around the anterior right knee as indicated in the picture above. Neurologic: Sensation intact distally Psychiatric: Patient is  competent for consent with normal mood and affect Lymphatic: No axillary or cervical lymphadenopathy  MUSCULOSKELETAL: Active motion of the right knee is from about 5 degrees to 80 degrees.  I do not appreciate an effusion.  He has circumferential cellulitis around the patella, concerning for significant sepsis.  Assessment: Principal Problem:   Septic prepatellar bursitis of right knee Active Problems:   Cerebral infarction (HCC) - incidental, punctate R periventricular infarct   HLD (hyperlipidemia)   Acute renal failure superimposed on stage 3 chronic kidney disease (HCC)   Temporal arteritis (HCC)    Plan: I recommended surgical intervention, he initially wanted to go home and have this done as an outpatient, however I have convinced him that it would be best to perform the operation here while he is in the hospital.  He did have breakfast, as well as a sip of soda with his medications at 10 AM.  We will need at least 6 hours n.p.o., and plan for surgery later today per his request.  The risks benefits and alternatives of been discussed at length including the risks for persistent infection, the need for revision surgery, bleeding, nerve injury, numbness laterally, the fact that we will be leaving the wound open, and the risk for persistent morbidity/mortality.  He is willing to proceed.      Johnny Bridge, MD Cell [phone]   05/12/2018 11:46 AM

## 2018-05-12 NOTE — Progress Notes (Signed)
[facility]  PROGRESS NOTE  Casey Lee ZMO:[id number] DOB: March 30, 1928 DOA: 05/11/2018 PCP: [name], MD   Brief HPI:   84 year old male with a history of hyperlipidemia, stroke, temporal arteritis, CKD stage III who came to ED with right knee pain.  Found to have septic prepatellar bursitis of right knee underwent incision and drainage in the ED.  Started on empiric antibiotics.    Subjective   This morning patient denies any pain feels better.   Assessment/Plan:     1. Septic prepatellar bursitis of right knee-status post incision and drainage in the ED. patient started on vancomycin and ceftriaxone.  Will consult orthopedics for further recommendations as needs to look swollen and might need debridement. 2. History of CVA-continue aspirin, Crestor 3. Acute kidney injury on CKD stage III-patient's baseline creatinine is 1.18, now presents with creatinine of 1.72.  Improved with IV fluids to 1.42. 4. Temporal arteritis-patient is on tapering dose of prednisone 5 mg daily.  Continue home dose of prednisone, patient also received 1 dose of Solu-Cortef 50 mg x 1 in the ED.    DVT prophylaxis: SCDs  Code Status: Full code  Family Communication: No family at bedside  Disposition Plan: likely home when medically ready for discharge   Consultants:    Procedures:     Antibiotics:   Anti-infectives (From admission, onward)   Start     Dose/Rate Route Frequency Ordered Stop   05/12/18 2200  vancomycin (VANCOCIN) IVPB 750 mg/150 ml premix     750 mg 150 mL/hr over 60 Minutes Intravenous Every 24 hours 05/11/18 2129     05/11/18 2130  cefTRIAXone (ROCEPHIN) 1 g in sodium chloride 0.9 % 100 mL IVPB     1 g 200 mL/hr over 30 Minutes Intravenous Every 24 hours 05/11/18 2110     05/11/18 2000  doxycycline (VIBRA-TABS) tablet 100 mg  Status:  Discontinued     100 mg Oral  Once 05/11/18 1953 05/11/18 2010   05/11/18 2000  vancomycin (VANCOCIN) 1,500 mg in sodium  chloride 0.9 % 500 mL IVPB     1,500 mg 250 mL/hr over 120 Minutes Intravenous  Once 05/11/18 1953 05/12/18 0148       Objective   Vitals:   05/11/18 2041 05/11/18 2117 05/11/18 2300 05/12/18 0435  BP: 116/80 (!) 138/98  120/72  Pulse: (!) 104 100  (!) 46  Resp:  16  16  Temp:  98.1 F (36.7 C)  97.9 F (36.6 C)  TempSrc:  Oral  Oral  SpO2: 97% 99%  98%  Weight:   67.9 kg   Height:        Intake/Output Summary (Last 24 hours) at 05/12/2018 1324 Last data filed at 05/12/2018 0700 Gross per 24 hour  Intake 120 ml  Output 550 ml  Net -430 ml   Filed Weights   05/11/18 1534 05/11/18 2300  Weight: 68.9 kg 67.9 kg     Physical Examination:    General: Appears in no acute distress  Cardiovascular: S1-S2, regular  Respiratory: Clear to auscultation bilaterally  Abdomen: Soft, nontender, no organomegaly  Extremities: Right knee edematous, erythematous, tender to palpation  Neurologic: Alert, oriented x3, no focal deficit     Data Reviewed: I have personally reviewed following labs and imaging studies  CBG: No results for input(s): GLUCAP in the last 168 hours.  CBC: Recent Labs  Lab 05/11/18 1537 05/12/18 0410  WBC 11.6* 8.1  NEUTROABS 10.7*  --   HGB 12.5*  10.4*  HCT 40.0 32.3*  MCV 99.3 96.1  PLT PLATELET CLUMPS NOTED ON SMEAR, UNABLE TO ESTIMATE 035    Basic Metabolic Panel: Recent Labs  Lab 05/11/18 1537 05/12/18 0410  NA 131* 135  K 4.6 4.3  CL 103 107  CO2 17* 20*  GLUCOSE 160* 178*  BUN 37* 31*  CREATININE 1.72* 1.42*  CALCIUM 8.9 8.0*    Recent Results (from the past 240 hour(s))  Blood culture (routine x 2)     Status: None (Preliminary result)   Collection Time: 05/11/18  3:43 PM  Result Value Ref Range Status   Specimen Description BLOOD RIGHT ANTECUBITAL  Final   Special Requests   Final    BOTTLES DRAWN AEROBIC AND ANAEROBIC Blood Culture adequate volume   Culture   Final    NO GROWTH < 24 HOURS Performed at [facility], [place] [address], [place], [place] [address]    Report Status PENDING  Incomplete  Blood culture (routine x 2)     Status: None (Preliminary result)   Collection Time: 05/11/18  3:48 PM  Result Value Ref Range Status   Specimen Description BLOOD LEFT ANTECUBITAL  Final   Special Requests   Final    BOTTLES DRAWN AEROBIC AND ANAEROBIC Blood Culture adequate volume   Culture   Final    NO GROWTH < 24 HOURS Performed at [facility], [place] [address] [place], [place] [address]    Report Status PENDING  Incomplete  Wound or Superficial Culture     Status: None (Preliminary result)   Collection Time: 05/11/18  7:28 PM  Result Value Ref Range Status   Specimen Description KNEE BURSA  Final   Special Requests Normal  Final   Gram Stain NO WBC SEEN RARE GRAM POSITIVE COCCI   Final   Culture   Final    CULTURE REINCUBATED FOR BETTER GROWTH Performed at [facility], [address], [place], [place] [address]    Report Status PENDING  Incomplete     Liver Function Tests: Recent Labs  Lab 05/11/18 1537  AST 26  ALT 30  ALKPHOS 89  BILITOT 1.2  PROT 5.9*  ALBUMIN 2.9*   No results for input(s): LIPASE, AMYLASE in the last 168 hours. No results for input(s): AMMONIA in the last 168 hours.  Cardiac Enzymes: No results for input(s): CKTOTAL, CKMB, CKMBINDEX, TROPONINI in the last 168 hours. BNP (last 3 results) Recent Labs    05/11/18 2212  BNP 264.7*    ProBNP (last 3 results) No results for input(s): PROBNP in the last 8760 hours.    Studies: Dg Chest 2 View  Result Date: 05/11/2018 CLINICAL DATA:  Dyspnea on exertion.  Ex-smoker. EXAM: CHEST - 2 VIEW COMPARISON:  01/24/2016. FINDINGS: Normal sized heart. Clear lungs. The lungs are mildly hyperexpanded with mild diffuse prominence of the interstitial markings. Thoracic spine degenerative changes. IMPRESSION: No acute abnormality.  Mild changes of COPD. Electronically Signed   By: Claudie Revering M.D.   On:  05/11/2018 16:42   Dg Knee Complete 4 Views Right  Result Date: 05/11/2018 CLINICAL DATA:  Right knee redness, warmth and swelling. EXAM: RIGHT KNEE - COMPLETE 4+ VIEW COMPARISON:  None. FINDINGS: Prepatellar soft tissue swelling. Moderate-sized superior patellar enthesophyte. Moderate-sized anterior tibial spine enthesophyte. Medial and lateral meniscal calcification. No effusion. Mild atheromatous arterial calcifications. IMPRESSION: 1. Prepatellar soft tissue swelling.  This could be due to bursitis. 2. No effusion. 3. Chondrocalcinosis.  Electronically Signed   By: Claudie Revering M.D.   On: 05/11/2018 16:43    Scheduled Meds: . aspirin EC  81 mg Oral Q1200  . chlorhexidine  60 mL Topical Once  . dipyridamole-aspirin  1 capsule Oral BID  . loratadine  10 mg Oral Daily  . ondansetron (ZOFRAN) IV  4 mg Intravenous Once  . povidone-iodine  2 application Topical Once  . predniSONE  5 mg Oral Daily  . rosuvastatin  10 mg Oral Daily      Time spent: 25 min  [facility] Pager [phone]. If 7PM-7AM, please contact night-coverage at [email or web address], Office  [phone]  password [email or web address]  05/12/2018, 1:24 PM  LOS: 0 days

## 2018-05-12 NOTE — Progress Notes (Signed)
Patient off unit to OR.

## 2018-05-12 NOTE — Op Note (Signed)
05/12/2018  4:04 PM  PATIENT:  Casey Lee    PRE-OPERATIVE DIAGNOSIS: Right knee septic prepatellar bursitis  POST-OPERATIVE DIAGNOSIS:  Same  PROCEDURE:   1.  Excision, right prepatellar bursa 2.  Irrigation, debridement, skin, subcutaneous tissue, fascia, prepatellar bursa  SURGEON:  Johnny Bridge, MD  PHYSICIAN ASSISTANT: None  ANESTHESIA:   General  PREOPERATIVE INDICATIONS:  Casey Lee is a  84 y.o. male who presented with 2 weeks of worsening swelling and anterior knee pain with redness.  He had a incision and drainage in the emergency room with a 2 mm incision, but had persistent increasing redness and purulence.  He elected for more definitive surgical management.  The risks benefits and alternatives were discussed with the patient preoperatively including but not limited to the risks of infection, bleeding, nerve injury, persistent infection, the need for further irrigation and debridement, cardiopulmonary complications, the need for revision surgery, among others, and the patient was willing to proceed.  ESTIMATED BLOOD LOSS: 40 mL  OPERATIVE IMPLANTS: Penrose drain tacked, x1 with iodoform packing gauze  OPERATIVE FINDINGS: Considerable septic prepatellar bursitis that was tracking up into the distal portion of the thigh.  The fascia and joint appeared to be intact.  OPERATIVE PROCEDURE: The patient is brought to the operating room and placed in the supine position.  He was on a regimen of Rocephin.  The right lower extremity was prepped and draped in usual sterile fashion.  Timeout performed.  Anterior incision was made over the prepatellar bursa.  The bursa was excised sharply with a scalpel.  A rondure and a curette and a Cobb were then used to debride any of the remaining tissue overlying the patella and going around on either side of the capsule of the joint and the quadriceps tendon.  After complete excisional debridement carried out, I irrigated a total  of 6 L of fluid through the wound.  A clean bed was achieved.  I placed a proximal and distal retention suture to help reapproximate the skin although leaving it fairly wide open.  I packed significant amount of iodoform gauze into the area and also placed a Penrose drain distally.  The wounds were dressed with sterile gauze, and a light compressive wrap was applied to the whole leg because he already had some pretibial edema.  He was awakened and returned to the PACU in stable and satisfactory condition.  There were no complications and he tolerated the procedure well.

## 2018-05-12 NOTE — Progress Notes (Signed)
Received report from Vicente Males, [place] patient resting comfortably in bed denies any needs at this time.

## 2018-05-12 NOTE — Anesthesia Preprocedure Evaluation (Signed)
Anesthesia Evaluation  Patient identified by MRN, date of birth, ID band Patient awake    Reviewed: Allergy & Precautions, NPO status , Patient's Chart, lab work & pertinent test results  History of Anesthesia Complications Negative for: history of anesthetic complications  Airway Mallampati: II  TM Distance: >3 FB Neck ROM: Full    Dental  (+) Teeth Intact, Dental Advisory Given   Pulmonary neg shortness of breath, neg recent URI, former smoker,    breath sounds clear to auscultation       Cardiovascular hypertension, + Peripheral Vascular Disease  + dysrhythmias Atrial Fibrillation  Rhythm:Irregular Rate:Normal  Giant Cell Arteritis   Neuro/Psych TIA   GI/Hepatic negative GI ROS, Neg liver ROS,   Endo/Other    Renal/GU Renal InsufficiencyRenal disease     Musculoskeletal  (+) Arthritis ,   Abdominal   Peds  Hematology  (+) Blood dyscrasia (Plavix), anemia ,   Anesthesia Other Findings Day of surgery medications reviewed with the patient.  Reproductive/Obstetrics                             Anesthesia Physical Anesthesia Plan  ASA: III  Anesthesia Plan: General   Post-op Pain Management:    Induction: Intravenous  PONV Risk Score and Plan: 2 and Ondansetron and Dexamethasone  Airway Management Planned: LMA  Additional Equipment: None  Intra-op Plan:   Post-operative Plan: Extubation in OR  Informed Consent: I have reviewed the patients History and Physical, chart, labs and discussed the procedure including the risks, benefits and alternatives for the proposed anesthesia with the patient or authorized representative who has indicated his/her understanding and acceptance.   Dental advisory given  Plan Discussed with: CRNA and Surgeon  Anesthesia Plan Comments:         Anesthesia Quick Evaluation

## 2018-05-12 NOTE — Progress Notes (Signed)
Dr. Radford Pax passed on request for pt to be seen in atrial fib clinic in follow-up (newly dx atrial flutter in PACU, asymptomatic). Will forward request to afib clinic to call pt with information. Dayna Dunn PA-C

## 2018-05-12 NOTE — Progress Notes (Signed)
Pt's wife located in surgical waitng room on 2nd floor - pt's wife uses a w/c and a 4 prong cane. Pt transferred up to 5N25 by C. Everett Ehrler, RN via w/c -updated 5 n RN on pt family status- pt's son driving down from [place] will be here at 1830.  Pt's wife assisted to geri chair in room. C. Joliana Claflin confirmed chair was locked in place. Cane placed w/in reach of pt's wife.

## 2018-05-12 NOTE — Progress Notes (Signed)
Report called to short stay nurse. CHG complete and Consent signed.

## 2018-05-12 NOTE — Transfer of Care (Signed)
Immediate Anesthesia Transfer of Care Note  Patient: Casey Lee  Procedure(s) Performed: IRRIGATION AND DEBRIDEMENT RIGHT KNEE BURSA (Right Knee)  Patient Location: PACU  Anesthesia Type:General  Level of Consciousness: drowsy and patient cooperative  Airway & Oxygen Therapy: Patient Spontanous Breathing and Patient connected to nasal cannula oxygen  Post-op Assessment: Report given to RN and Post -op Vital signs reviewed and stable  Post vital signs: Reviewed and stable  Last Vitals:  Vitals Value Taken Time  BP    Temp    Pulse    Resp    SpO2      Last Pain:  Vitals:   05/12/18 1000  TempSrc:   PainSc: 3          Complications: No apparent anesthesia complications

## 2018-05-12 NOTE — Progress Notes (Signed)
Called by Dr. Mardelle Matte, the patient's EKG shows atrial flutter/fibrillation with rate controlled.  Likely new onset.  Reviewed the EKG with heart rate of 106, showing atrial flutter.  Patient has chads2vasc score of around 4-5.  I called and discussed with cardiology Dr. Radford Pax, who recommends putting patient on anticoagulation and low-dose Toprol-XL 25 mg daily.  Cardiology will follow patient in the A. fib clinic as outpatient. We will start Eliquis from tomorrow morning.

## 2018-05-12 NOTE — Anesthesia Procedure Notes (Signed)
Procedure Name: LMA Insertion Date/Time: 05/12/2018 3:06 PM Performed by: Bryson Corona, CRNA Pre-anesthesia Checklist: Patient identified, Emergency Drugs available, Suction available and Patient being monitored Patient Re-evaluated:Patient Re-evaluated prior to induction Oxygen Delivery Method: Circle System Utilized Preoxygenation: Pre-oxygenation with 100% oxygen Induction Type: IV induction Ventilation: Mask ventilation without difficulty LMA: LMA inserted LMA Size: 4.0 Number of attempts: 1 Placement Confirmation: positive ETCO2 Tube secured with: Tape Dental Injury: Teeth and Oropharynx as per pre-operative assessment

## 2018-05-12 NOTE — Progress Notes (Signed)
Pt. transported via stretcher from ER to 5N-25; alert and oriented x4; (R) knee red, swollen, and pt. states no pain at present. Pt. oriented to call button and room. No c/o's.

## 2018-05-13 ENCOUNTER — Other Ambulatory Visit: Payer: Self-pay

## 2018-05-13 LAB — BASIC METABOLIC PANEL
Anion gap: 11 (ref 5–15)
BUN: 26 mg/dL — AB (ref 8–23)
CALCIUM: 7.7 mg/dL — AB (ref 8.9–10.3)
CHLORIDE: 108 mmol/L (ref 98–111)
CO2: 18 mmol/L — ABNORMAL LOW (ref 22–32)
CREATININE: 1.29 mg/dL — AB (ref 0.61–1.24)
GFR calc non Af Amer: 48 mL/min — ABNORMAL LOW (ref >60)
GFR, EST AFRICAN AMERICAN: 56 mL/min — AB (ref >60)
Glucose, Bld: 152 mg/dL — ABNORMAL HIGH (ref 70–99)
Potassium: 4.1 mmol/L (ref 3.5–5.1)
SODIUM: 137 mmol/L (ref 135–145)

## 2018-05-13 LAB — CBC
HCT: 31.8 % — ABNORMAL LOW (ref 39.0–52.0)
HEMOGLOBIN: 10.1 g/dL — AB (ref 13.0–17.0)
MCH: 31.2 pg (ref 26.0–34.0)
MCHC: 31.8 g/dL (ref 30.0–36.0)
MCV: 98.1 fL (ref 78.0–100.0)
PLATELETS: 142 10*3/uL — AB (ref 150–400)
RBC: 3.24 MIL/uL — ABNORMAL LOW (ref 4.22–5.81)
RDW: 14.5 % (ref 11.5–15.5)
WBC: 8.8 10*3/uL (ref 4.0–10.5)

## 2018-05-13 MED ORDER — VANCOMYCIN HCL IN DEXTROSE 1-5 GM/200ML-% IV SOLN
1000.0000 mg | INTRAVENOUS | Status: DC
  Administered 2018-05-13: 1000 mg via INTRAVENOUS
  Filled 2018-05-13: qty 200

## 2018-05-13 MED ORDER — KETOROLAC TROMETHAMINE 30 MG/ML IJ SOLN
30.0000 mg | Freq: Four times a day (QID) | INTRAMUSCULAR | Status: DC | PRN
  Administered 2018-05-13 – 2018-05-16 (×2): 30 mg via INTRAVENOUS
  Filled 2018-05-13 (×2): qty 1

## 2018-05-13 NOTE — Progress Notes (Signed)
[facility]  PROGRESS NOTE  Casey Lee GGY:[id number] DOB: 1927-10-18 DOA: 05/11/2018 PCP: [name], MD   Brief HPI:   84 year old male with a history of hyperlipidemia, stroke, temporal arteritis, CKD stage III who came to ED with right knee pain.  Found to have septic prepatellar bursitis of right knee underwent incision and drainage in the ED.  Started on empiric antibiotics.    Subjective   Patient seen and examined, denies pain.  Yesterday EKG showed he was in atrial flutter with rate controlled.  Started on Eliquis after discussion with cardiology.   Assessment/Plan:     1. Septic prepatellar bursitis of right knee-status post excision of right prepatellar bursa, irrigation debridement per Dr. Mardelle Matte.  Culture from the abscess growing staph aureus.  Patient is currently on vancomycin and ceftriaxone. 2. Atrial flutter-rate controlled, EKG yesterday showed atrial flutter with controlled heart rate.  Patient started on Eliquis. CHA2DS2VASc score is around 4.  He does have a history of CVA.  As heart rate is controlled, will not start any beta-blocker at this time. 3. History of CVA-continue Eliquis, Crestor. 4. Acute kidney injury on CKD stage III-patient's baseline creatinine is 1.18, now presents with creatinine of 1.72.  Improved with IV fluids to 1.42. 5. Temporal arteritis-patient is on tapering dose of prednisone 5 mg daily.  Continue home dose of prednisone, patient also received 1 dose of Solu-Cortef 50 mg x 1 in the ED.    DVT prophylaxis: SCDs  Code Status: Full code  Family Communication: No family at bedside  Disposition Plan: likely home when medically ready for discharge   Consultants:    Procedures:     Antibiotics:   Anti-infectives (From admission, onward)   Start     Dose/Rate Route Frequency Ordered Stop   05/13/18 2100  vancomycin (VANCOCIN) IVPB 1000 mg/200 mL premix     1,000 mg 200 mL/hr over 60 Minutes Intravenous Every 24  hours 05/13/18 1051     05/12/18 2200  vancomycin (VANCOCIN) IVPB 750 mg/150 ml premix  Status:  Discontinued     750 mg 150 mL/hr over 60 Minutes Intravenous Every 24 hours 05/11/18 2129 05/13/18 1051   05/11/18 2130  cefTRIAXone (ROCEPHIN) 1 g in sodium chloride 0.9 % 100 mL IVPB     1 g 200 mL/hr over 30 Minutes Intravenous Every 24 hours 05/11/18 2110     05/11/18 2000  doxycycline (VIBRA-TABS) tablet 100 mg  Status:  Discontinued     100 mg Oral  Once 05/11/18 1953 05/11/18 2010   05/11/18 2000  vancomycin (VANCOCIN) 1,500 mg in sodium chloride 0.9 % 500 mL IVPB     1,500 mg 250 mL/hr over 120 Minutes Intravenous  Once 05/11/18 1953 05/12/18 0148       Objective   Vitals:   05/12/18 2018 05/13/18 0048 05/13/18 0540 05/13/18 1222  BP: 131/76 115/69 121/70 (!) 128/48  Pulse: 80 75 72 77  Resp: 16 16 16 19   Temp: 97.8 F (36.6 C) 98.2 F (36.8 C) 97.7 F (36.5 C) 97.9 F (36.6 C)  TempSrc: Oral Oral Oral Oral  SpO2: 96% 98% 98% 99%  Weight:      Height:        Intake/Output Summary (Last 24 hours) at 05/13/2018 1352 Last data filed at 05/13/2018 0940 Gross per 24 hour  Intake 2305.33 ml  Output 880 ml  Net 1425.33 ml   Filed Weights   05/11/18 1534 05/11/18 2300  Weight: 68.9 kg 67.9  kg     Physical Examination:    General: Appears in no acute distress Cardiovascular: S1-S2, regular Respiratory: Clear to auscultation bilaterally Abdomen: Soft, nontender, no organomegaly Musculoskeletal: Right knee in dressing       Data Reviewed: I have personally reviewed following labs and imaging studies  CBG: No results for input(s): GLUCAP in the last 168 hours.  CBC: Recent Labs  Lab 05/11/18 1537 05/12/18 0410 05/13/18 0515  WBC 11.6* 8.1 8.8  NEUTROABS 10.7*  --   --   HGB 12.5* 10.4* 10.1*  HCT 40.0 32.3* 31.8*  MCV 99.3 96.1 98.1  PLT PLATELET CLUMPS NOTED ON SMEAR, UNABLE TO ESTIMATE 150 142*    Basic Metabolic Panel: Recent Labs  Lab  05/11/18 1537 05/12/18 0410 05/13/18 0515  NA 131* 135 137  K 4.6 4.3 4.1  CL 103 107 108  CO2 17* 20* 18*  GLUCOSE 160* 178* 152*  BUN 37* 31* 26*  CREATININE 1.72* 1.42* 1.29*  CALCIUM 8.9 8.0* 7.7*    Recent Results (from the past 240 hour(s))  Blood culture (routine x 2)     Status: None (Preliminary result)   Collection Time: 05/11/18  3:43 PM  Result Value Ref Range Status   Specimen Description BLOOD RIGHT ANTECUBITAL  Final   Special Requests   Final    BOTTLES DRAWN AEROBIC AND ANAEROBIC Blood Culture adequate volume   Culture   Final    NO GROWTH < 24 HOURS Performed at [facility], [address] [employer] [place], [place] [address]    Report Status PENDING  Incomplete  Blood culture (routine x 2)     Status: None (Preliminary result)   Collection Time: 05/11/18  3:48 PM  Result Value Ref Range Status   Specimen Description BLOOD LEFT ANTECUBITAL  Final   Special Requests   Final    BOTTLES DRAWN AEROBIC AND ANAEROBIC Blood Culture adequate volume   Culture   Final    NO GROWTH < 24 HOURS Performed at [facility], [place] [address] [place], [place] [address]    Report Status PENDING  Incomplete  Wound or Superficial Culture     Status: None (Preliminary result)   Collection Time: 05/11/18  7:28 PM  Result Value Ref Range Status   Specimen Description KNEE BURSA  Final   Special Requests   Final    Normal Performed at [facility], [place] [address], [place], [place] [address]    Gram Stain NO WBC SEEN RARE GRAM POSITIVE COCCI   Final   Culture FEW STAPHYLOCOCCUS AUREUS  Final   Report Status PENDING  Incomplete  Aerobic/Anaerobic Culture (surgical/deep wound)     Status: None (Preliminary result)   Collection Time: 05/12/18  4:09 PM  Result Value Ref Range Status   Specimen Description TISSUE RIGHT KNEE PRE PATELLA BURSA  Final   Special Requests NONE  Final   Gram Stain   Final    FEW WBC PRESENT, PREDOMINANTLY PMN FEW GRAM POSITIVE COCCI IN  PAIRS Performed at [facility], [place] [address], [place], [place] [address]    Culture FEW STAPHYLOCOCCUS AUREUS  Final   Report Status PENDING  Incomplete     Liver Function Tests: Recent Labs  Lab 05/11/18 1537  AST 26  ALT 30  ALKPHOS 89  BILITOT 1.2  PROT 5.9*  ALBUMIN 2.9*   No results for input(s): LIPASE, AMYLASE in the last 168 hours. No results for input(s): AMMONIA  in the last 168 hours.  Cardiac Enzymes: No results for input(s): CKTOTAL, CKMB, CKMBINDEX, TROPONINI in the last 168 hours. BNP (last 3 results) Recent Labs    05/11/18 2212  BNP 264.7*    ProBNP (last 3 results) No results for input(s): PROBNP in the last 8760 hours.    Studies: Dg Chest 2 View  Result Date: 05/11/2018 CLINICAL DATA:  Dyspnea on exertion.  Ex-smoker. EXAM: CHEST - 2 VIEW COMPARISON:  01/24/2016. FINDINGS: Normal sized heart. Clear lungs. The lungs are mildly hyperexpanded with mild diffuse prominence of the interstitial markings. Thoracic spine degenerative changes. IMPRESSION: No acute abnormality.  Mild changes of COPD. Electronically Signed   By: Claudie Revering M.D.   On: 05/11/2018 16:42   Dg Knee Complete 4 Views Right  Result Date: 05/11/2018 CLINICAL DATA:  Right knee redness, warmth and swelling. EXAM: RIGHT KNEE - COMPLETE 4+ VIEW COMPARISON:  None. FINDINGS: Prepatellar soft tissue swelling. Moderate-sized superior patellar enthesophyte. Moderate-sized anterior tibial spine enthesophyte. Medial and lateral meniscal calcification. No effusion. Mild atheromatous arterial calcifications. IMPRESSION: 1. Prepatellar soft tissue swelling.  This could be due to bursitis. 2. No effusion. 3. Chondrocalcinosis. Electronically Signed   By: Claudie Revering M.D.   On: 05/11/2018 16:43    Scheduled Meds: . apixaban  5 mg Oral BID  . loratadine  10 mg Oral Daily  . predniSONE  5 mg Oral Daily  . rosuvastatin  10 mg Oral Daily      Time spent: 25 min  [facility] Pager [phone]. If 7PM-7AM, please contact night-coverage at [email or web address], Office  [phone]  password [email or web address]  05/13/2018, 1:52 PM  LOS: 1 day

## 2018-05-13 NOTE — Evaluation (Signed)
Physical Therapy Evaluation Patient Details Name: Casey Lee MRN: [id number] DOB: 1927/10/07 Today's Date: 05/13/2018   History of Present Illness  Pt is a 84 yo male admitted through [facility] ED with right knee pain s/p 1 week. Pt was diagnosed with septic prepatellrar bursitis and underwent an I&D on 05/12/18. Pt developed atrial fibrillation after surgery and was started on medications to control. PMH significant for SDH, temporal ateritis, vertigo, CKD, HLD.   Clinical Impression  Pt presents with the above diagnosis and below deficits for PT evaluation. Prior to admission, pt was independent and working full time as a Cabin crew. Pt was also helping care for his wife who has complications from having the flu last year. Pt is able to perform most mobility with min guard to supervision with no c/o pain this session. Pt's main concern is being able to get down the 11 steps to his basement office and is considering a stair lift in the future. Pt would like to do his therapy outpatient, but suggested home health initially might be beneficial. Advised pt that case management would discuss his options prior to discharge. Pt will benefit form continued acute PT services in order to address stair negotiation and assist with a return to PLOF.     Follow Up Recommendations Outpatient PT;Home health PT(HH vs Outpaitnet. Pt prefers outpatient)    Equipment Recommendations  None recommended by PT    Recommendations for Other Services       Precautions / Restrictions Precautions Precautions: Fall Restrictions Weight Bearing Restrictions: Yes RLE Weight Bearing: Weight bearing as tolerated      Mobility  Bed Mobility               General bed mobility comments: sitting up in recliner when PT arrives  Transfers Overall transfer level: Needs assistance Equipment used: Rolling walker (2 wheeled) Transfers: Sit to/from Stand Sit to Stand: Min guard         General transfer comment: Min  guard for safety with IV  Ambulation/Gait Ambulation/Gait assistance: Supervision;Min guard Gait Distance (Feet): 150 Feet Assistive device: Rolling walker (2 wheeled) Gait Pattern/deviations: Step-through pattern;Antalgic Gait velocity: decreased   General Gait Details: mild antalgic gait with right knee and decreased knee flexion during swing phase  Stairs            Wheelchair Mobility    Modified Rankin (Stroke Patients Only)       Balance Overall balance assessment: Needs assistance Sitting-balance support: No upper extremity supported Sitting balance-Leahy Scale: Normal     Standing balance support: No upper extremity supported;Bilateral upper extremity supported Standing balance-Leahy Scale: Fair Standing balance comment: able to stand briefly to urinate and stand at sink to wash hands                             Pertinent Vitals/Pain Pain Assessment: No/denies pain    Home Living Family/patient expects to be discharged to:: Private residence Living Arrangements: Spouse/significant other Available Help at Discharge: Family Type of Home: House Home Access: Level entry     Home Layout: Multi-level Home Equipment: None      Prior Function Level of Independence: Independent         Comments: 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, educational   Dominant Hand: Right    Extremity/Trunk Assessment   Upper Extremity Assessment Upper Extremity Assessment: Overall WFL for tasks assessed  Lower Extremity Assessment Lower Extremity Assessment: RLE deficits/detail RLE Deficits / Details: ROM deficits and grossly 3+/5     Cervical / Trunk Assessment Cervical / Trunk Assessment: Kyphotic  Communication   Communication: No difficulties  Cognition Arousal/Alertness: Awake/alert Behavior During Therapy: WFL for tasks assessed/performed Overall Cognitive Status: Within Functional Limits for tasks assessed                                         General Comments      Exercises     Assessment/Plan    PT Assessment Patient needs continued PT services  PT Problem List Decreased range of motion;Decreased strength;Decreased activity tolerance;Decreased balance;Decreased mobility;Pain       PT Treatment Interventions DME instruction;Gait training;Stair training;Functional mobility training;Therapeutic activities;Therapeutic exercise;Balance training;Patient/family education    PT Goals (Current goals can be found in the Care Plan section)  Acute Rehab PT Goals Patient Stated Goal: to get home and back to work PT Goal Formulation: With patient Time For Goal Achievement: 05/20/18 Potential to Achieve Goals: Good    Frequency Min 5X/week   Barriers to discharge        Co-evaluation               AM-PAC PT "6 Clicks" Daily Activity  Outcome Measure Difficulty turning over in bed (including adjusting bedclothes, sheets and blankets)?: A Lot Difficulty moving from lying on back to sitting on the side of the bed? : A Lot Difficulty sitting down on and standing up from a chair with arms (e.g., wheelchair, bedside commode, etc,.)?: A Lot Help needed moving to and from a bed to chair (including a wheelchair)?: A Little Help needed walking in hospital room?: A Little Help needed climbing 3-5 steps with a railing? : A Lot 6 Click Score: 14    End of Session Equipment Utilized During Treatment: Gait belt Activity Tolerance: Patient tolerated treatment well Patient left: in chair;with call bell/phone within reach Nurse Communication: Mobility status PT Visit Diagnosis: Unsteadiness on feet (R26.81);Muscle weakness (generalized) (M62.81);History of falling (Z91.81);Pain Pain - Right/Left: Right Pain - part of body: Knee    Time: 1610-9604 PT Time Calculation (min) (ACUTE ONLY): 51 min   Charges:   PT Evaluation $PT Eval Moderate Complexity: 1 Mod PT Treatments $Gait  Training: 8-22 mins $Therapeutic Activity: 8-22 mins        Scheryl Marten PT, DPT   Lakin Romer Sloan Leiter 05/13/2018, 1:27 PM

## 2018-05-13 NOTE — Plan of Care (Signed)
  Problem: Activity: Goal: Risk for activity intolerance will decrease Outcome: Completed/Met   Problem: Pain Managment: Goal: General experience of comfort will improve Outcome: Progressing   Problem: Safety: Goal: Ability to remain free from injury will improve Outcome: Progressing

## 2018-05-13 NOTE — Plan of Care (Signed)
  Problem: Coping: Goal: Level of anxiety will decrease Outcome: Progressing   Problem: Pain Managment: Goal: General experience of comfort will improve Outcome: Progressing   Problem: Safety: Goal: Ability to remain free from injury will improve Outcome: Progressing   Problem: Skin Integrity: Goal: Risk for impaired skin integrity will decrease Outcome: Progressing   

## 2018-05-13 NOTE — Progress Notes (Signed)
ANTIBIOTIC CONSULT NOTE  Pharmacy Consult for Vancomycin Indication: Right knee prepatellar bursitis with infection  No Known Allergies  Patient Measurements: Height: 5\' 9"  (175.3 cm) Weight: 149 lb 11.1 oz (67.9 kg) IBW/kg (Calculated) : 70.7   Vital Signs: Temp: 97.7 F (36.5 C) (08/11 0540) Temp Source: Oral (08/11 0540) BP: 121/70 (08/11 0540) Pulse Rate: 72 (08/11 0540) Intake/Output from previous day: 08/10 0701 - 08/11 0700 In: 2065.3 [P.O.:240; I.V.:1325.3; IV Piggyback:450] Out: 480 [Urine:450; Blood:30] Intake/Output from this shift: No intake/output data recorded.  Labs: Recent Labs    05/11/18 1537 05/11/18 2115 05/12/18 0410 05/13/18 0515  WBC 11.6*  --  8.1 8.8  HGB 12.5*  --  10.4* 10.1*  PLT PLATELET CLUMPS NOTED ON SMEAR, UNABLE TO ESTIMATE  --  150 142*  LABCREA  --  24.67  --   --   CREATININE 1.72*  --  1.42* 1.29*   Estimated Creatinine Clearance: 38.7 mL/min (A) (by C-G formula based on SCr of 1.29 mg/dL (H)). No results for input(s): VANCOTROUGH, VANCOPEAK, VANCORANDOM, GENTTROUGH, GENTPEAK, GENTRANDOM, TOBRATROUGH, TOBRAPEAK, TOBRARND, AMIKACINPEAK, AMIKACINTROU, AMIKACIN in the last 72 hours.    Assessment: 84 yo male pt with R knee redness, warmth and swelling. Pharmacy has been asked to dose vancomycin. SCr trending down.  Goal of Therapy:  Vancomycin trough level 15-20 mcg/ml  Plan:  Increase Vancomycin to 1000mg  IV q24h Monitor clinical progress, cultures/sensitivities, renal function, abx plan Vancomycin trough as indicated   Thank you for allowing [place] to participate in this patients care.   Jens Som, PharmD Please utilize [facility] (under [place]) for appropriate number for your unit pharmacist. 05/13/2018 10:50 AM

## 2018-05-13 NOTE — Progress Notes (Addendum)
Patient ID: Casey Lee, male   DOB: 1927-12-28, 84 y.o.   MRN: [id number]     Subjective:  Patient reports pain as mild.  Patient up with PT and states that he is much better  Objective:   VITALS:   Vitals:   05/12/18 1850 05/12/18 2018 05/13/18 0048 05/13/18 0540  BP: (!) 147/87 131/76 115/69 121/70  Pulse: 75 80 75 72  Resp: 14 16 16 16   Temp: (!) 96.5 F (35.8 C) 97.8 F (36.6 C) 98.2 F (36.8 C) 97.7 F (36.5 C)  TempSrc: Axillary Oral Oral Oral  SpO2: 94% 96% 98% 98%  Weight:      Height:        ABD soft Sensation intact distally Dorsiflexion/Plantar flexion intact Incision: dressing C/D/I and no drainage  Dressing changed. Drain and packing removed.   Lab Results  Component Value Date   WBC 8.8 05/13/2018   HGB 10.1 (L) 05/13/2018   HCT 31.8 (L) 05/13/2018   MCV 98.1 05/13/2018   PLT 142 (L) 05/13/2018   BMET    Component Value Date/Time   NA 137 05/13/2018 0515   K 4.1 05/13/2018 0515   CL 108 05/13/2018 0515   CO2 18 (L) 05/13/2018 0515   GLUCOSE 152 (H) 05/13/2018 0515   BUN 26 (H) 05/13/2018 0515   CREATININE 1.29 (H) 05/13/2018 0515   CALCIUM 7.7 (L) 05/13/2018 0515   GFRNONAA 48 (L) 05/13/2018 0515   GFRAA 56 (L) 05/13/2018 0515     Assessment/Plan: 1 Day Post-Op   Principal Problem:   Septic prepatellar bursitis of right knee Active Problems:   Cerebral infarction ([place]) - incidental, punctate R periventricular infarct   HLD (hyperlipidemia)   Acute renal failure superimposed on stage 3 chronic kidney disease (HCC)   Temporal arteritis (HCC)   Right knee pain   Advance diet Up with therapy Continue plan per medicine WBAT Dry dressing PRN     DOUGLAS PARRY, BRANDON 05/13/2018, 11:37 AM  Discussed and agree with above.   Marchia Bond, MD Cell [phone]

## 2018-05-14 ENCOUNTER — Encounter (HOSPITAL_COMMUNITY): Payer: Self-pay | Admitting: Orthopedic Surgery

## 2018-05-14 DIAGNOSIS — I5033 Acute on chronic diastolic (congestive) heart failure: Secondary | ICD-10-CM

## 2018-05-14 LAB — AEROBIC CULTURE  (SUPERFICIAL SPECIMEN)

## 2018-05-14 LAB — AEROBIC CULTURE W GRAM STAIN (SUPERFICIAL SPECIMEN)
Gram Stain: NONE SEEN
Special Requests: NORMAL

## 2018-05-14 LAB — BASIC METABOLIC PANEL
ANION GAP: 8 (ref 5–15)
BUN: 26 mg/dL — ABNORMAL HIGH (ref 8–23)
CALCIUM: 7.6 mg/dL — AB (ref 8.9–10.3)
CO2: 20 mmol/L — ABNORMAL LOW (ref 22–32)
Chloride: 107 mmol/L (ref 98–111)
Creatinine, Ser: 1.36 mg/dL — ABNORMAL HIGH (ref 0.61–1.24)
GFR calc non Af Amer: 45 mL/min — ABNORMAL LOW (ref >60)
GFR, EST AFRICAN AMERICAN: 52 mL/min — AB (ref >60)
Glucose, Bld: 107 mg/dL — ABNORMAL HIGH (ref 70–99)
POTASSIUM: 3.8 mmol/L (ref 3.5–5.1)
Sodium: 135 mmol/L (ref 135–145)

## 2018-05-14 MED ORDER — METOPROLOL SUCCINATE ER 25 MG PO TB24: 25.0000 mg | ORAL_TABLET | Freq: Two times a day (BID) | ORAL | Status: DC

## 2018-05-14 MED ORDER — SULFAMETHOXAZOLE-TRIMETHOPRIM 400-80 MG PO TABS
1.0000 | ORAL_TABLET | Freq: Two times a day (BID) | ORAL | Status: DC
  Administered 2018-05-14 – 2018-05-16 (×5): 1 via ORAL
  Filled 2018-05-14 (×5): qty 1

## 2018-05-14 MED ORDER — METOPROLOL SUCCINATE ER 25 MG PO TB24
25.0000 mg | ORAL_TABLET | Freq: Every day | ORAL | Status: DC
  Administered 2018-05-14 – 2018-05-16 (×3): 25 mg via ORAL
  Filled 2018-05-14 (×3): qty 1

## 2018-05-14 MED ORDER — FUROSEMIDE 10 MG/ML IJ SOLN
20.0000 mg | Freq: Two times a day (BID) | INTRAMUSCULAR | Status: DC
  Administered 2018-05-14 – 2018-05-15 (×3): 20 mg via INTRAVENOUS
  Filled 2018-05-14 (×3): qty 2

## 2018-05-14 NOTE — Progress Notes (Addendum)
Patient ID: Casey Lee, male   DOB: 1928/05/14, 84 y.o.   MRN: [id number]     Subjective:  Patient reports pain as mild to moderate.  Patient in the chair and very lethargic.  Denies any CP or SOB.  Objective:   VITALS:   Vitals:   05/14/18 0840 05/14/18 1227 05/14/18 1239 05/14/18 1300  BP:  98/84 135/86   Pulse: (!) 110 (!) 133 92 (!) 117  Resp: (!) 25  16   Temp:   98.6 F (37 C) 98.4 F (36.9 C)  TempSrc:   Oral Oral  SpO2:  92% 96% 97%  Weight:      Height:        ABD soft Sensation intact distally Dorsiflexion/Plantar flexion intact Incision: dressing C/D/I and no drainage   Lab Results  Component Value Date   WBC 8.8 05/13/2018   HGB 10.1 (L) 05/13/2018   HCT 31.8 (L) 05/13/2018   MCV 98.1 05/13/2018   PLT 142 (L) 05/13/2018   BMET    Component Value Date/Time   NA 135 05/14/2018 0325   K 3.8 05/14/2018 0325   CL 107 05/14/2018 0325   CO2 20 (L) 05/14/2018 0325   GLUCOSE 107 (H) 05/14/2018 0325   BUN 26 (H) 05/14/2018 0325   CREATININE 1.36 (H) 05/14/2018 0325   CALCIUM 7.6 (L) 05/14/2018 0325   GFRNONAA 45 (L) 05/14/2018 0325   GFRAA 52 (L) 05/14/2018 0325     Assessment/Plan: 2 Days Post-Op   Principal Problem:   Septic prepatellar bursitis of right knee Active Problems:   Cerebral infarction ([place]) - incidental, punctate R periventricular infarct   HLD (hyperlipidemia)   Acute renal failure superimposed on stage 3 chronic kidney disease (HCC)   Temporal arteritis (HCC)   Right knee pain   Advance diet Up with therapy Dry dressing PRN WBAT Continue plan per medicin    DOUGLAS PARRY, BRANDON 05/14/2018, 2:06 PM  Discussed and agree with above.  Appreciate medical mgmt.  Will dc narcotics, monitor mental function.   Dressing clean today, plan for dressing change tomorrow.   Marchia Bond, MD Cell [phone]

## 2018-05-14 NOTE — Progress Notes (Signed)
Pt working with PT an noticed to be more fatigued and weak than yesterday. Also noticed drop in BP. Pt was shivering earlier but not temp at this time, HR staying up in the 110s 120s, started on new beta blocker this AM along with lasix to help with pitting edema. Put on 1L of 02 to help with labored breathing but pt states he does not feel short of breath. Rapid nurse was around and took a look at pt and will keep on her radar. MD made aware. RN will continue to monitor for any changes.

## 2018-05-14 NOTE — Discharge Instructions (Addendum)
Diet: As you were doing prior to hospitalization   Shower:  May shower but keep the wounds dry, use an occlusive plastic wrap, NO SOAKING IN TUB.  If the bandage gets wet, change with a clean dry gauze.  If you have a splint on, leave the splint in place and keep the splint dry with a plastic bag.  Dressing:  Change your dressing daily with wet gauze at the wound, using a wet to dry technique, changing with wet gauze overwrapped with dry gauze and an ace wrap and changing the dressings every day.     Activity:  Increase activity slowly as tolerated, but follow the weight bearing instructions below.  The rules on driving is that you can not be taking narcotics while you drive, and you must feel in control of the vehicle.    Weight Bearing:   As tolerated.      To prevent constipation: you may use a stool softener such as -  Colace (over the counter) 100 mg by mouth twice a day  Drink plenty of fluids (prune juice may be helpful) and high fiber foods Miralax (over the counter) for constipation as needed.    Itching:  If you experience itching with your medications, try taking only a single pain pill, or even half a pain pill at a time.  You may take up to 10 pain pills per day, and you can also use benadryl over the counter for itching or also to help with sleep.   Precautions:  If you experience chest pain or shortness of breath - call 911 immediately for transfer to the hospital emergency department!!  If you develop a fever greater that 101 F, purulent drainage from wound, increased redness or drainage from wound, or calf pain -- Call the office at [phone]                                                Follow- Up Appointment:  Please call for an appointment to be seen in 2 weeks [facility] - [phone]    Information on my medicine - ELIQUIS (apixaban)  Why was Eliquis prescribed for you? Eliquis was prescribed for you to reduce the risk of a blood clot forming that can  cause a stroke if you have a medical condition called atrial fibrillation (a type of irregular heartbeat).  What do You need to know about Eliquis ? Take your Eliquis TWICE DAILY - one tablet in the morning and one tablet in the evening with or without food. If you have difficulty swallowing the tablet whole please discuss with your pharmacist how to take the medication safely.  Take Eliquis exactly as prescribed by your doctor and DO NOT stop taking Eliquis without talking to the doctor who prescribed the medication.  Stopping may increase your risk of developing a stroke.  Refill your prescription before you run out.  After discharge, you should have regular check-up appointments with your healthcare provider that is prescribing your Eliquis.  In the future your dose may need to be changed if your kidney function or weight changes by a significant amount or as you get older.  What do you do if you miss a dose? If you miss a dose, take it as soon as you remember on the same day and resume taking twice daily.  Do not take more than  one dose of ELIQUIS at the same time to make up a missed dose.  Important Safety Information A possible side effect of Eliquis is bleeding. You should call your healthcare provider right away if you experience any of the following: ? Bleeding from an injury or your nose that does not stop. ? Unusual colored urine (red or dark brown) or unusual colored stools (red or black). ? Unusual bruising for unknown reasons. ? A serious fall or if you hit your head (even if there is no bleeding).  Some medicines may interact with Eliquis and might increase your risk of bleeding or clotting while on Eliquis. To help avoid this, consult your healthcare provider or pharmacist prior to using any new prescription or non-prescription medications, including herbals, vitamins, non-steroidal anti-inflammatory drugs (NSAIDs) and supplements.  This website has more information on  Eliquis (apixaban): http://www.eliquis.com/eliquis/home

## 2018-05-14 NOTE — Care Management Note (Addendum)
Case Management Note  Patient Details  Name: Casey Lee MRN: [id number] Date of Birth: 04-20-1928  Subjective/Objective:   Septic Prepatellar bursitis of right knee                 Action/Plan: NCM spoke to pt, wife and son at bedside. Pt lives in home with wife. Son is from [place]. Offered choice for HH/list provided. Wife requested [facility] for [facility] RN/PT, aide. Wife states he has a [place] that will possible assist with a personal care assistant to help if needed. Has RW and bedside commode at home. Contacted [facility] with new referral.   Will need HH RN, PT and aide orders with F2F to include orders of dressing changes to knee.   Expected Discharge Date:                 Expected Discharge Plan:  [place]  In-House Referral:  NA  Discharge planning Services  CM Consult  Post Acute Care Choice:  Home Health Choice offered to:  Spouse  DME Arranged:  N/A DME Agency:  NA  HH Arranged:  PT, Nurse's Aide, RN [place] Agency:  [place]  Status of Service:  In process, will continue to follow  If discussed at Long Length of Stay Meetings, dates discussed:    Additional Comments:  Erenest Rasher, RN 05/14/2018, 4:52 PM

## 2018-05-14 NOTE — Progress Notes (Addendum)
Physical Therapy Treatment Patient Details Name: TEIGAN SAHLI MRN: [id number] DOB: 06-19-1928 Today's Date: 05/14/2018    History of Present Illness Pt is a 84 yo male admitted through [facility] ED with right knee pain s/p 1 week. Pt was diagnosed with septic prepatellrar bursitis and underwent an I&D on 05/12/18. Pt developed atrial fibrillation after surgery and was started on medications to control. PMH significant for SDH, temporal ateritis, vertigo, CKD, HLD.     PT Comments    Pt presenting with decreased function and requiring increased assistance during session this am.  He is noted to have drop in BP with activity.  Pt with poor balance including multiple LOB during session.  Informed nursing of vitals and noticeable DOE.  Placed 1L O2 post session as breathing remains labored.  Will inform supervising PT of need for change in recommendations at this time based on functional mobility.     Follow Up Recommendations  SNF(Prefers OPPT but based on presentation he will require SNF to improve function before returning home to his elderly wife)     Equipment Recommendations  None recommended by PT    Recommendations for Other Services       Precautions / Restrictions Precautions Precautions: Fall Restrictions Weight Bearing Restrictions: Yes RLE Weight Bearing: Weight bearing as tolerated    Mobility  Bed Mobility Overal bed mobility: Needs Assistance Bed Mobility: Supine to Sit     Supine to sit: Mod assist     General bed mobility comments: Pt required cues for hand placement with tactile guidance for technique.  To achieve sitting he required assistance for LE advancement and trunk elevation.  Pt able to maintain static sitting edge of bed.    Transfers Overall transfer level: Needs assistance Equipment used: Rolling walker (2 wheeled) Transfers: Sit to/from Stand Sit to Stand: Min assist;Mod assist         General transfer comment: Cues for hand placement to and  from seated surface.  Pt with posterior lean and required min assistance to stand.  To return to sit he require mod assistance after LOB to the L.    Ambulation/Gait Ambulation/Gait assistance: Min assist;Mod assist(assist level varried.  ) Gait Distance (Feet): 70 Feet Assistive device: Rolling walker (2 wheeled) Gait Pattern/deviations: Step-through pattern;Step-to pattern;Shuffle;Antalgic;Drifts right/left;Staggering left;Trunk flexed Gait velocity: decreased   General Gait Details: Pt performed gait with poor safety awareness and poor use of device with multiple LOB.  Pt slow and unsteady ultimately requiring assistance to chair to obtain vitals.  BP 98/84 upon sitting, after rest break his vitals returned to 135/86.  HR elevated to 130 during gait training and decreased to 118 with seated rest break.  SPo2 93%, with noticeable DOE.     Stairs             Wheelchair Mobility    Modified Rankin (Stroke Patients Only)       Balance Overall balance assessment: Needs assistance   Sitting balance-Leahy Scale: Good       Standing balance-Leahy Scale: Fair                              Cognition Arousal/Alertness: Awake/alert Behavior During Therapy: WFL for tasks assessed/performed Overall Cognitive Status: Within Functional Limits for tasks assessed  Exercises      General Comments        Pertinent Vitals/Pain Pain Assessment: No/denies pain    Home Living                      Prior Function            PT Goals (current goals can now be found in the care plan section) Acute Rehab PT Goals Patient Stated Goal: to get home and back to work Potential to Achieve Goals: Good Progress towards PT goals: Progressing toward goals    Frequency    Min 5X/week      PT Plan Current plan remains appropriate    Co-evaluation              AM-PAC PT "6 Clicks" Daily Activity   Outcome Measure  Difficulty turning over in bed (including adjusting bedclothes, sheets and blankets)?: Unable Difficulty moving from lying on back to sitting on the side of the bed? : Unable Difficulty sitting down on and standing up from a chair with arms (e.g., wheelchair, bedside commode, etc,.)?: Unable Help needed moving to and from a bed to chair (including a wheelchair)?: A Little Help needed walking in hospital room?: A Little Help needed climbing 3-5 steps with a railing? : A Little 6 Click Score: 12    End of Session Equipment Utilized During Treatment: Gait belt Activity Tolerance: Patient tolerated treatment well Patient left: in chair;with call bell/phone within reach Nurse Communication: Mobility status PT Visit Diagnosis: Unsteadiness on feet (R26.81);Muscle weakness (generalized) (M62.81);History of falling (Z91.81);Pain Pain - Right/Left: Right Pain - part of body: Knee     Time: 1208-1237 PT Time Calculation (min) (ACUTE ONLY): 29 min  Charges:  $Gait Training: 8-22 mins $Therapeutic Activity: 8-22 mins                     Governor Rooks, PTA pager [phone]    Cristela Blue 05/14/2018, 12:55 PM

## 2018-05-14 NOTE — Care Management Important Message (Signed)
Important Message  Patient Details  Name: Casey Lee MRN: [id number] Date of Birth: 1928-05-15   Medicare Important Message Given:  Yes    Erenest Rasher, RN 05/14/2018, 4:51 PM

## 2018-05-14 NOTE — Progress Notes (Signed)
[facility]  PROGRESS NOTE  Casey Lee:[id number] DOB: April 20, 1928 DOA: 05/11/2018 PCP: [name], MD   Brief HPI:   84 year old male with a history of hyperlipidemia, stroke, temporal arteritis, CKD stage III who came to ED with right knee pain.  Found to have septic prepatellar bursitis of right knee underwent incision and drainage in the ED.  Started on empiric antibiotics.    Subjective   Patient seen and examined, this morning heart rate went up to 120s 130s.  Appears lethargic.   Assessment/Plan:     1. Septic prepatellar bursitis of right knee-status post excision of right prepatellar bursa, irrigation debridement per Dr. Mardelle Matte.  Culture from the abscess growing staph aureus.  We will switch IV antibiotics to Bactrim 2. Acute on chronic diastolic CHF-patient has grade 1 diastolic dysfunction, appears lethargic since yesterday's incision and drainage.  Likely mild fluid overload.  Will start Lasix 20 mg IV every 12 hours. 3. Atrial flutter-rate controlled, EKG yesterday showed atrial flutter with controlled heart rate.  Patient started on Eliquis. CHA2DS2VASc score is around 4.  He does have a history of CVA.  Will start low-dose Toprol-XL 25 g p.o. daily 4. History of CVA-continue Eliquis, Crestor. 5. Acute kidney injury on CKD stage III-patient's baseline creatinine is 1.18.  Started on IV Lasix for mild fluid overload.  Follow BMP in am. 6. Temporal arteritis-patient is on tapering dose of prednisone 5 mg daily.  Continue home dose of prednisone, patient also received 1 dose of Solu-Cortef 50 mg x 1 in the ED.    DVT prophylaxis: SCDs  Code Status: Full code  Family Communication: No family at bedside  Disposition Plan: likely home when medically ready for discharge   Consultants:    Procedures:     Antibiotics:   Anti-infectives (From admission, onward)   Start     Dose/Rate Route Frequency Ordered Stop   05/14/18 1330   sulfamethoxazole-trimethoprim (BACTRIM,SEPTRA) 400-80 MG per tablet 1 tablet     1 tablet Oral Every 12 hours 05/14/18 1315     05/13/18 2100  vancomycin (VANCOCIN) IVPB 1000 mg/200 mL premix  Status:  Discontinued     1,000 mg 200 mL/hr over 60 Minutes Intravenous Every 24 hours 05/13/18 1051 05/14/18 1314   05/12/18 2200  vancomycin (VANCOCIN) IVPB 750 mg/150 ml premix  Status:  Discontinued     750 mg 150 mL/hr over 60 Minutes Intravenous Every 24 hours 05/11/18 2129 05/13/18 1051   05/11/18 2130  cefTRIAXone (ROCEPHIN) 1 g in sodium chloride 0.9 % 100 mL IVPB     1 g 200 mL/hr over 30 Minutes Intravenous Every 24 hours 05/11/18 2110     05/11/18 2000  doxycycline (VIBRA-TABS) tablet 100 mg  Status:  Discontinued     100 mg Oral  Once 05/11/18 1953 05/11/18 2010   05/11/18 2000  vancomycin (VANCOCIN) 1,500 mg in sodium chloride 0.9 % 500 mL IVPB     1,500 mg 250 mL/hr over 120 Minutes Intravenous  Once 05/11/18 1953 05/12/18 0148       Objective   Vitals:   05/14/18 0840 05/14/18 1227 05/14/18 1239 05/14/18 1300  BP:  98/84 135/86   Pulse: (!) 110 (!) 133 92 (!) 117  Resp: (!) 25  16   Temp:   98.6 F (37 C) 98.4 F (36.9 C)  TempSrc:   Oral Oral  SpO2:  92% 96% 97%  Weight:      Height:  Intake/Output Summary (Last 24 hours) at 05/14/2018 1653 Last data filed at 05/14/2018 1500 Gross per 24 hour  Intake 1120 ml  Output 1900 ml  Net -780 ml   Filed Weights   05/11/18 1534 05/11/18 2300  Weight: 68.9 kg 67.9 kg     Physical Examination:    Mouth: Oral mucosa is moist, no lesions on palate,  Neck: Supple, no deformities, masses, or tenderness Lungs: Normal respiratory effort, decreased breath sounds bilaterally Heart: Regular rate and rhythm, S1 and S2 normal, no murmurs, rubs auscultated Abdomen: BS normoactive,soft,nondistended,non-tender to palpation,no organomegaly Extremities: Right knee in dressing Neuro : Alert and oriented to time, place and  person, No focal deficits      Data Reviewed: I have personally reviewed following labs and imaging studies  CBG: No results for input(s): GLUCAP in the last 168 hours.  CBC: Recent Labs  Lab 05/11/18 1537 05/12/18 0410 05/13/18 0515  WBC 11.6* 8.1 8.8  NEUTROABS 10.7*  --   --   HGB 12.5* 10.4* 10.1*  HCT 40.0 32.3* 31.8*  MCV 99.3 96.1 98.1  PLT PLATELET CLUMPS NOTED ON SMEAR, UNABLE TO ESTIMATE 150 142*    Basic Metabolic Panel: Recent Labs  Lab 05/11/18 1537 05/12/18 0410 05/13/18 0515 05/14/18 0325  NA 131* 135 137 135  K 4.6 4.3 4.1 3.8  CL 103 107 108 107  CO2 17* 20* 18* 20*  GLUCOSE 160* 178* 152* 107*  BUN 37* 31* 26* 26*  CREATININE 1.72* 1.42* 1.29* 1.36*  CALCIUM 8.9 8.0* 7.7* 7.6*    Recent Results (from the past 240 hour(s))  Blood culture (routine x 2)     Status: None (Preliminary result)   Collection Time: 05/11/18  3:43 PM  Result Value Ref Range Status   Specimen Description BLOOD RIGHT ANTECUBITAL  Final   Special Requests   Final    BOTTLES DRAWN AEROBIC AND ANAEROBIC Blood Culture adequate volume   Culture   Final    NO GROWTH 3 DAYS Performed at [facility], [address], [place], [place] [address]    Report Status PENDING  Incomplete  Blood culture (routine x 2)     Status: None (Preliminary result)   Collection Time: 05/11/18  3:48 PM  Result Value Ref Range Status   Specimen Description BLOOD LEFT ANTECUBITAL  Final   Special Requests   Final    BOTTLES DRAWN AEROBIC AND ANAEROBIC Blood Culture adequate volume   Culture   Final    NO GROWTH 3 DAYS Performed at [facility], [place] [address], [place],  [address]    Report Status PENDING  Incomplete  Wound or Superficial Culture     Status: None   Collection Time: 05/11/18  7:28 PM  Result Value Ref Range Status   Specimen Description KNEE BURSA  Final   Special Requests   Final    Normal Performed at [facility], [place] [address]  [place], [place] [address]    Gram Stain NO WBC SEEN RARE GRAM POSITIVE COCCI   Final   Culture FEW STAPHYLOCOCCUS AUREUS  Final   Report Status 05/14/2018 FINAL  Final   Organism ID, Bacteria STAPHYLOCOCCUS AUREUS  Final      Susceptibility   Staphylococcus aureus - MIC*    CIPROFLOXACIN <=0.5 SENSITIVE Sensitive     ERYTHROMYCIN <=0.25 SENSITIVE Sensitive     GENTAMICIN <=0.5 SENSITIVE Sensitive     OXACILLIN 0.5 SENSITIVE Sensitive  TETRACYCLINE <=1 SENSITIVE Sensitive     VANCOMYCIN 1 SENSITIVE Sensitive     TRIMETH/SULFA <=10 SENSITIVE Sensitive     CLINDAMYCIN <=0.25 SENSITIVE Sensitive     RIFAMPIN <=0.5 SENSITIVE Sensitive     Inducible Clindamycin NEGATIVE Sensitive     * FEW STAPHYLOCOCCUS AUREUS  Aerobic/Anaerobic Culture (surgical/deep wound)     Status: None (Preliminary result)   Collection Time: 05/12/18  4:09 PM  Result Value Ref Range Status   Specimen Description TISSUE RIGHT KNEE PRE PATELLA BURSA  Final   Special Requests NONE  Final   Gram Stain   Final    FEW WBC PRESENT, PREDOMINANTLY PMN FEW GRAM POSITIVE COCCI IN PAIRS Performed at [facility], [address] [place], [place] [address]    Culture   Final    FEW STAPHYLOCOCCUS AUREUS NO ANAEROBES ISOLATED; CULTURE IN PROGRESS FOR 5 DAYS    Report Status PENDING  Incomplete   Organism ID, Bacteria STAPHYLOCOCCUS AUREUS  Final      Susceptibility   Staphylococcus aureus - MIC*    CIPROFLOXACIN <=0.5 SENSITIVE Sensitive     ERYTHROMYCIN <=0.25 SENSITIVE Sensitive     GENTAMICIN <=0.5 SENSITIVE Sensitive     OXACILLIN 0.5 SENSITIVE Sensitive     TETRACYCLINE <=1 SENSITIVE Sensitive     VANCOMYCIN 1 SENSITIVE Sensitive     TRIMETH/SULFA <=10 SENSITIVE Sensitive     CLINDAMYCIN <=0.25 SENSITIVE Sensitive     RIFAMPIN <=0.5 SENSITIVE Sensitive     Inducible Clindamycin NEGATIVE Sensitive     * FEW STAPHYLOCOCCUS AUREUS     Liver Function Tests: Recent Labs  Lab 05/11/18 1537  AST 26  ALT  30  ALKPHOS 89  BILITOT 1.2  PROT 5.9*  ALBUMIN 2.9*   No results for input(s): LIPASE, AMYLASE in the last 168 hours. No results for input(s): AMMONIA in the last 168 hours.  Cardiac Enzymes: No results for input(s): CKTOTAL, CKMB, CKMBINDEX, TROPONINI in the last 168 hours. BNP (last 3 results) Recent Labs    05/11/18 2212  BNP 264.7*    ProBNP (last 3 results) No results for input(s): PROBNP in the last 8760 hours.    Studies: No results found.  Scheduled Meds: . apixaban  5 mg Oral BID  . furosemide  20 mg Intravenous Q12H  . loratadine  10 mg Oral Daily  . metoprolol succinate  25 mg Oral Daily  . predniSONE  5 mg Oral Daily  . rosuvastatin  10 mg Oral Daily  . sulfamethoxazole-trimethoprim  1 tablet Oral Q12H      Time spent: 25 min  [facility] Pager [phone]. If 7PM-7AM, please contact night-coverage at [email or web address], Office  [phone]  password [email or web address]  05/14/2018, 4:53 PM  LOS: 2 days

## 2018-05-15 LAB — CBC
HEMATOCRIT: 34.4 % — AB (ref 39.0–52.0)
HEMOGLOBIN: 11.3 g/dL — AB (ref 13.0–17.0)
MCH: 30.8 pg (ref 26.0–34.0)
MCHC: 32.8 g/dL (ref 30.0–36.0)
MCV: 93.7 fL (ref 78.0–100.0)
Platelets: 192 10*3/uL (ref 150–400)
RBC: 3.67 MIL/uL — AB (ref 4.22–5.81)
RDW: 14.6 % (ref 11.5–15.5)
WBC: 9 10*3/uL (ref 4.0–10.5)

## 2018-05-15 LAB — BASIC METABOLIC PANEL
ANION GAP: 13 (ref 5–15)
BUN: 24 mg/dL — ABNORMAL HIGH (ref 8–23)
CHLORIDE: 100 mmol/L (ref 98–111)
CO2: 22 mmol/L (ref 22–32)
Calcium: 8 mg/dL — ABNORMAL LOW (ref 8.9–10.3)
Creatinine, Ser: 1.65 mg/dL — ABNORMAL HIGH (ref 0.61–1.24)
GFR calc non Af Amer: 36 mL/min — ABNORMAL LOW (ref >60)
GFR, EST AFRICAN AMERICAN: 41 mL/min — AB (ref >60)
Glucose, Bld: 112 mg/dL — ABNORMAL HIGH (ref 70–99)
POTASSIUM: 3.6 mmol/L (ref 3.5–5.1)
Sodium: 135 mmol/L (ref 135–145)

## 2018-05-15 LAB — MAGNESIUM: Magnesium: 1.7 mg/dL (ref 1.7–2.4)

## 2018-05-15 MED ORDER — MAGNESIUM SULFATE IN D5W 1-5 GM/100ML-% IV SOLN
1.0000 g | Freq: Once | INTRAVENOUS | Status: AC
  Administered 2018-05-15: 1 g via INTRAVENOUS
  Filled 2018-05-15: qty 100

## 2018-05-15 NOTE — Progress Notes (Addendum)
Patient seen and examined.  Overall doing well, no new complaints, feeling better.  His mental function is improved.  Gerald Stabs, his son is at the bedside.  He wants to go home.  On examination his right knee has an open wound with still some persistent cellulitic changes around the anterior knee, there is a scant amount of drainage, no significant purulence.  The dressings were changed.  I have instructed Gerald Stabs on wet-to-dry dressing changes, he does not believe that they will need home health to achieve wet-to-dry dressing changes.  I will leave that to them to determine.  Impression: Septic right prepatellar bursitis, cultures have grown out methicillin sensitive staph aureus  Plan: Overall doing better, he wants to go home today, this seems reasonable from my standpoint.  Continue wet-to-dry dressing changes, antibiotics per the medical service, discharge with follow-up with me in 1 week for a wound check.  Johnny Bridge, MD

## 2018-05-15 NOTE — Clinical Social Work Note (Signed)
Clinical Social Work Assessment  Patient Details  Name: Casey Lee MRN: [id number] Date of Birth: [birth year]  Date of referral:  05/15/18               Reason for consult:  Discharge Planning                Permission sought to share information with:    Permission granted to share information::     Name::        Agency::     Relationship::     Contact Information:     Housing/Transportation Living arrangements for the past 2 months:  Single Family Home Source of Information:  Patient Patient Interpreter Needed:  None Criminal Activity/Legal Involvement Pertinent to Current Situation/Hospitalization:  No - Comment as needed Significant Relationships:  Spouse Lives with:  Spouse Do you feel safe going back to the place where you live?  Yes Need for family participation in patient care:  Yes (Comment)  Care giving concerns:  CSW received consult for discharge needs. CSW spoke with patient and his wife regarding PT recommendation of SNF placement at time of discharge. Patient refused rehab at [facility].  Patient states that he want to go home and with the assistance of home health care.    Social Worker assessment / plan:  CSW spoke with patient concerning possibility of rehab at [facility] before returning home.  Employment status:  Retired Forensic scientist:  Medicare PT Recommendations:  [place] / Referral to community resources:     Patient/Family's Response to care: Patient recognizes need for rehab but states only wants to receive care at home.   Patient/Family's Understanding of and Emotional Response to Diagnosis, Current Treatment, and Prognosis:  Patient is realistic regarding therapy needs and expressed his wishes to receive rehab at home.  Patient expressed understanding of CSW role and discharge process as well as medical condition. No questions/concerns about plan or treatment.   Emotional Assessment Appearance:  Appears stated  age Attitude/Demeanor/Rapport:  Engaged Affect (typically observed):  Blunt Orientation:  Oriented to Self, Oriented to Place, Oriented to  Time, Oriented to Situation Alcohol / Substance use:  Not Applicable Psych involvement (Current and /or in the community):  No (Comment)  Discharge Needs  Concerns to be addressed:  Basic Needs, Care Coordination Readmission within the last 30 days:    Current discharge risk:  None Barriers to Discharge:  Continued Medical Work up   [employer], [place] 05/15/2018, 3:12 PM

## 2018-05-15 NOTE — Progress Notes (Signed)
PT Cancellation Note  Patient Details Name: Casey Lee MRN: [id number] DOB: October 30, 1927   Cancelled Treatment:    Reason Eval/Treat Not Completed: (P) Other (comment)(Pt sitting on commode having BM on arrival will return for PT tx later this pm.  )   Lynnmarie Lovett Eli Hose 05/15/2018, 1:47 PM  Governor Rooks, PTA pager [phone]

## 2018-05-15 NOTE — Care Management Important Message (Signed)
Important Message  Patient Details  Name: Casey Lee MRN: [id number] Date of Birth: [birth year]   Medicare Important Message Given:  Yes    Melessa Cowell Montine Circle 05/15/2018, 8:05 AM

## 2018-05-15 NOTE — Progress Notes (Addendum)
[facility]  PROGRESS NOTE  Casey Lee OMV:[id number] DOB: Nov 22, 1927 DOA: 05/11/2018 PCP: [name], MD   Brief HPI:   83 year old male with a history of hyperlipidemia, stroke, temporal arteritis, CKD stage III who came to ED with right knee pain.  Found to have septic prepatellar bursitis of right knee underwent incision and drainage in the ED.  Started on empiric antibiotics.    Subjective   Patient seen and examined, continues to lethargic.  Heart rate still around 100s.   Assessment/Plan:     1. Septic prepatellar bursitis of right knee-status post excision of right prepatellar bursa, irrigation debridement per Dr. Mardelle Matte.  Culture from the abscess growing methicillin sensitive staph aureus.  IV antibiotics changed to p.o. Bactrim.   2. Acute on chronic diastolic CHF-patient has grade 1 diastolic dysfunction, appears lethargic since yesterday's incision and drainage.  He was started on IV Lasix 20 mg daily lower with excellent diuresis.  Will discontinue IV Lasix at this time. 3. Atrial flutter-rate controlled, EKG yesterday showed atrial flutter with controlled heart rate.  Patient started on Eliquis. CHA2DS2VASc score is around 4.  He does have a history of CVA.  Also started on low-dose Toprol-XL 25 mg p.o. Daily.  Magnesium level 1.7 and borderline, will give 1 dose of 1 g mag sulfate IV 4. History of CVA-continue Eliquis, Crestor. 5. Acute kidney injury on CKD stage III-patient's baseline creatinine is 1.18.  Started on IV Lasix for mild fluid overload.  Patient's renal function.  This morning shows creatinine 1.65 6. Temporal arteritis-patient is on tapering dose of prednisone 5 mg daily.  Continue home dose of prednisone, patient also received 1 dose of Solu-Cortef 50 mg x 1 in the ED.    DVT prophylaxis: SCDs  Code Status: Full code  Family Communication: Discussed with patient's son at bedside  Disposition Plan: likely  snf   Consultants:    Procedures:     Antibiotics:   Anti-infectives (From admission, onward)   Start     Dose/Rate Route Frequency Ordered Stop   05/14/18 1330  sulfamethoxazole-trimethoprim (BACTRIM,SEPTRA) 400-80 MG per tablet 1 tablet     1 tablet Oral Every 12 hours 05/14/18 1315     05/13/18 2100  vancomycin (VANCOCIN) IVPB 1000 mg/200 mL premix  Status:  Discontinued     1,000 mg 200 mL/hr over 60 Minutes Intravenous Every 24 hours 05/13/18 1051 05/14/18 1314   05/12/18 2200  vancomycin (VANCOCIN) IVPB 750 mg/150 ml premix  Status:  Discontinued     750 mg 150 mL/hr over 60 Minutes Intravenous Every 24 hours 05/11/18 2129 05/13/18 1051   05/11/18 2130  cefTRIAXone (ROCEPHIN) 1 g in sodium chloride 0.9 % 100 mL IVPB     1 g 200 mL/hr over 30 Minutes Intravenous Every 24 hours 05/11/18 2110     05/11/18 2000  doxycycline (VIBRA-TABS) tablet 100 mg  Status:  Discontinued     100 mg Oral  Once 05/11/18 1953 05/11/18 2010   05/11/18 2000  vancomycin (VANCOCIN) 1,500 mg in sodium chloride 0.9 % 500 mL IVPB     1,500 mg 250 mL/hr over 120 Minutes Intravenous  Once 05/11/18 1953 05/12/18 0148       Objective   Vitals:   05/14/18 1300 05/14/18 1842 05/14/18 2122 05/15/18 0422  BP:  123/63 120/60 115/74  Pulse: (!) 117 (!) 102 85 (!) 107  Resp:  20 16 16   Temp: 98.4 F (36.9 C) 98 F (36.7 C) 97.9 F (  36.6 C) 98 F (36.7 C)  TempSrc: Oral Oral Oral Oral  SpO2: 97% 96% 98% 96%  Weight:      Height:        Intake/Output Summary (Last 24 hours) at 05/15/2018 1442 Last data filed at 05/15/2018 1307 Gross per 24 hour  Intake 480 ml  Output 1850 ml  Net -1370 ml   Filed Weights   05/11/18 1534 05/11/18 2300  Weight: 68.9 kg 67.9 kg     Physical Examination:     General: Appears lethargic  Cardiovascular: *S1-S2, irregular  Respiratory: Clear to auscultation bilaterally  Abdomen: Soft, nontender, no organomegaly  Musculoskeletal: Right knee in  dressing, bilateral 1+ pitting edema of the lower extremities right more than left     Data Reviewed: I have personally reviewed following labs and imaging studies  CBG: No results for input(s): GLUCAP in the last 168 hours.  CBC: Recent Labs  Lab 05/11/18 1537 05/12/18 0410 05/13/18 0515 05/15/18 0358  WBC 11.6* 8.1 8.8 9.0  NEUTROABS 10.7*  --   --   --   HGB 12.5* 10.4* 10.1* 11.3*  HCT 40.0 32.3* 31.8* 34.4*  MCV 99.3 96.1 98.1 93.7  PLT PLATELET CLUMPS NOTED ON SMEAR, UNABLE TO ESTIMATE 150 142* 841    Basic Metabolic Panel: Recent Labs  Lab 05/11/18 1537 05/12/18 0410 05/13/18 0515 05/14/18 0325 05/15/18 0358  NA 131* 135 137 135 135  K 4.6 4.3 4.1 3.8 3.6  CL 103 107 108 107 100  CO2 17* 20* 18* 20* 22  GLUCOSE 160* 178* 152* 107* 112*  BUN 37* 31* 26* 26* 24*  CREATININE 1.72* 1.42* 1.29* 1.36* 1.65*  CALCIUM 8.9 8.0* 7.7* 7.6* 8.0*  MG  --   --   --   --  1.7    Recent Results (from the past 240 hour(s))  Blood culture (routine x 2)     Status: None (Preliminary result)   Collection Time: 05/11/18  3:43 PM  Result Value Ref Range Status   Specimen Description BLOOD RIGHT ANTECUBITAL  Final   Special Requests   Final    BOTTLES DRAWN AEROBIC AND ANAEROBIC Blood Culture adequate volume   Culture   Final    NO GROWTH 4 DAYS Performed at [facility], [address], [place], [place] [address]    Report Status PENDING  Incomplete  Blood culture (routine x 2)     Status: None (Preliminary result)   Collection Time: 05/11/18  3:48 PM  Result Value Ref Range Status   Specimen Description BLOOD LEFT ANTECUBITAL  Final   Special Requests   Final    BOTTLES DRAWN AEROBIC AND ANAEROBIC Blood Culture adequate volume   Culture   Final    NO GROWTH 4 DAYS Performed at [facility], [place] [address], [place], [place] [address]    Report Status PENDING  Incomplete  Wound or Superficial Culture     Status: None   Collection Time: 05/11/18  7:28 PM   Result Value Ref Range Status   Specimen Description KNEE BURSA  Final   Special Requests   Final    Normal Performed at [facility], [place] [address], [place], [place] [address]    Gram Stain NO WBC SEEN RARE GRAM POSITIVE COCCI   Final   Culture FEW STAPHYLOCOCCUS AUREUS  Final   Report Status 05/14/2018 FINAL  Final   Organism ID, Bacteria STAPHYLOCOCCUS AUREUS  Final  Susceptibility   Staphylococcus aureus - MIC*    CIPROFLOXACIN <=0.5 SENSITIVE Sensitive     ERYTHROMYCIN <=0.25 SENSITIVE Sensitive     GENTAMICIN <=0.5 SENSITIVE Sensitive     OXACILLIN 0.5 SENSITIVE Sensitive     TETRACYCLINE <=1 SENSITIVE Sensitive     VANCOMYCIN 1 SENSITIVE Sensitive     TRIMETH/SULFA <=10 SENSITIVE Sensitive     CLINDAMYCIN <=0.25 SENSITIVE Sensitive     RIFAMPIN <=0.5 SENSITIVE Sensitive     Inducible Clindamycin NEGATIVE Sensitive     * FEW STAPHYLOCOCCUS AUREUS  Aerobic/Anaerobic Culture (surgical/deep wound)     Status: None (Preliminary result)   Collection Time: 05/12/18  4:09 PM  Result Value Ref Range Status   Specimen Description TISSUE RIGHT KNEE PRE PATELLA BURSA  Final   Special Requests NONE  Final   Gram Stain   Final    FEW WBC PRESENT, PREDOMINANTLY PMN FEW GRAM POSITIVE COCCI IN PAIRS Performed at [facility], [address] [place], [place] [address]    Culture   Final    FEW STAPHYLOCOCCUS AUREUS NO ANAEROBES ISOLATED; CULTURE IN PROGRESS FOR 5 DAYS    Report Status PENDING  Incomplete   Organism ID, Bacteria STAPHYLOCOCCUS AUREUS  Final      Susceptibility   Staphylococcus aureus - MIC*    CIPROFLOXACIN <=0.5 SENSITIVE Sensitive     ERYTHROMYCIN <=0.25 SENSITIVE Sensitive     GENTAMICIN <=0.5 SENSITIVE Sensitive     OXACILLIN 0.5 SENSITIVE Sensitive     TETRACYCLINE <=1 SENSITIVE Sensitive     VANCOMYCIN 1 SENSITIVE Sensitive     TRIMETH/SULFA <=10 SENSITIVE Sensitive     CLINDAMYCIN <=0.25 SENSITIVE Sensitive     RIFAMPIN <=0.5  SENSITIVE Sensitive     Inducible Clindamycin NEGATIVE Sensitive     * FEW STAPHYLOCOCCUS AUREUS     Liver Function Tests: Recent Labs  Lab 05/11/18 1537  AST 26  ALT 30  ALKPHOS 89  BILITOT 1.2  PROT 5.9*  ALBUMIN 2.9*   No results for input(s): LIPASE, AMYLASE in the last 168 hours. No results for input(s): AMMONIA in the last 168 hours.  Cardiac Enzymes: No results for input(s): CKTOTAL, CKMB, CKMBINDEX, TROPONINI in the last 168 hours. BNP (last 3 results) Recent Labs    05/11/18 2212  BNP 264.7*    ProBNP (last 3 results) No results for input(s): PROBNP in the last 8760 hours.    Studies: No results found.  Scheduled Meds: . apixaban  5 mg Oral BID  . loratadine  10 mg Oral Daily  . metoprolol succinate  25 mg Oral Daily  . predniSONE  5 mg Oral Daily  . rosuvastatin  10 mg Oral Daily  . sulfamethoxazole-trimethoprim  1 tablet Oral Q12H      Time spent: 25 min  [facility] Pager [phone]. If 7PM-7AM, please contact night-coverage at [email or web address], Office  [phone]  password [email or web address]  05/15/2018, 2:42 PM  LOS: 3 days

## 2018-05-15 NOTE — Progress Notes (Signed)
Physical Therapy Treatment Patient Details Name: Casey Lee MRN: [id number] DOB: May 16, 1928 Today's Date: 05/15/2018    History of Present Illness Pt is a 84 yo male admitted through [facility] ED with right knee pain s/p 1 week. Pt was diagnosed with septic prepatellrar bursitis and underwent an I&D on 05/12/18. Pt developed atrial fibrillation after surgery and was started on medications to control. PMH significant for SDH, temporal ateritis, vertigo, CKD, HLD.     PT Comments    Pt performed gait training and functional mobility this pm. Overall he is more alert and able to follow commands.  He required decreased assistance but he remains to require assistance of min to min guard assist.  Pt presents with noticeable DOE but unable to obtain accurate pulse ox reading due to poor waveform.  BP stable at 112/68.  I continue to stand by SNF placement for continued rehab before return home.  Pt presents with strength and functional deficits that could be improved with rehab before return home.  Pt is adamant to return home with support from spouse and is refusing placement at this time.  Pt will require HHPT, HHOT and aide if he chooses to return home.  Will continue PT per POC and retrial stairs to improve safety with entry into home and to his bedroom.     Follow Up Recommendations  SNF(Pt refusing SNF placement, will require max out of HHPT, HHOT and HH aide.  )     Equipment Recommendations  None recommended by PT    Recommendations for Other Services       Precautions / Restrictions Precautions Precautions: Fall Restrictions Weight Bearing Restrictions: Yes RLE Weight Bearing: Weight bearing as tolerated    Mobility  Bed Mobility               General bed mobility comments: Pt sitting in recliner on arrival.    Transfers Overall transfer level: Needs assistance Equipment used: Rolling walker (2 wheeled) Transfers: Sit to/from Stand Sit to Stand: Min guard          General transfer comment: Pt attempting to pull into standing and required cues for pushing from seated surface to improve safety with transitions.    Ambulation/Gait Ambulation/Gait assistance: Min guard;Min assist(Pt initially required min guard.  As patient began to fatigue he required increased assistance.  ) Gait Distance (Feet): 120 Feet(required seated rest break before attempting stair training.  ) Assistive device: Rolling walker (2 wheeled) Gait Pattern/deviations: Step-through pattern;Shuffle;Antalgic;Drifts right/left;Staggering left;Trunk flexed Gait velocity: decreased   General Gait Details: Pt required cues for forward gaze and to maintain RW safety.  Pt with difficulty maintaining stance in device during turns and backing.  Pt fatigues quickly which causes poor balance.     Stairs Stairs: Yes Stairs assistance: Min assist Stair Management: Two rails;One rail Right;Forwards Number of Stairs: 5 General stair comments: x2 forwards with B rails.  x3 forwards with R rail.  Pt slow and guarded and required cues for sequencing, placing whole foot on stair, and using hand rail.  Pt with LOB during negotiation of L step.  He demonstrates poor ability to clear step due to weakness.     Wheelchair Mobility    Modified Rankin (Stroke Patients Only)       Balance Overall balance assessment: Needs assistance   Sitting balance-Leahy Scale: Good       Standing balance-Leahy Scale: Fair  Cognition Arousal/Alertness: Awake/alert Behavior During Therapy: WFL for tasks assessed/performed Overall Cognitive Status: Within Functional Limits for tasks assessed                                        Exercises      General Comments        Pertinent Vitals/Pain Pain Assessment: No/denies pain    Home Living                      Prior Function            PT Goals (current goals can now be found in the  care plan section) Acute Rehab PT Goals Patient Stated Goal: to get home and back to work Potential to Achieve Goals: Good Progress towards PT goals: Progressing toward goals    Frequency    Min 5X/week      PT Plan Current plan remains appropriate    Co-evaluation              AM-PAC PT "6 Clicks" Daily Activity  Outcome Measure  Difficulty turning over in bed (including adjusting bedclothes, sheets and blankets)?: Unable Difficulty moving from lying on back to sitting on the side of the bed? : Unable Difficulty sitting down on and standing up from a chair with arms (e.g., wheelchair, bedside commode, etc,.)?: Unable Help needed moving to and from a bed to chair (including a wheelchair)?: A Little Help needed walking in hospital room?: A Little Help needed climbing 3-5 steps with a railing? : A Little 6 Click Score: 12    End of Session Equipment Utilized During Treatment: Gait belt Activity Tolerance: Patient tolerated treatment well Patient left: in chair;with call bell/phone within reach Nurse Communication: Mobility status PT Visit Diagnosis: Unsteadiness on feet (R26.81);Muscle weakness (generalized) (M62.81);History of falling (Z91.81);Pain Pain - Right/Left: Right Pain - part of body: Knee     Time: 1520-1551 PT Time Calculation (min) (ACUTE ONLY): 31 min  Charges:  $Gait Training: 8-22 mins $Therapeutic Activity: 8-22 mins                     Governor Rooks, PTA pager [phone]    Cristela Blue 05/15/2018, 4:32 PM

## 2018-05-15 NOTE — Anesthesia Postprocedure Evaluation (Signed)
Anesthesia Post Note  Patient: Casey Lee  Procedure(s) Performed: IRRIGATION AND DEBRIDEMENT RIGHT KNEE BURSA (Right Knee)     Patient location during evaluation: PACU Anesthesia Type: General Level of consciousness: awake and patient cooperative Pain management: pain level controlled Vital Signs Assessment: post-procedure vital signs reviewed and stable Respiratory status: spontaneous breathing, nonlabored ventilation, respiratory function stable and patient connected to nasal cannula oxygen Cardiovascular status: stable Postop Assessment: no apparent nausea or vomiting Anesthetic complications: no    Last Vitals:  Vitals:   05/14/18 2122 05/15/18 0422  BP: 120/60 115/74  Pulse: 85 (!) 107  Resp: 16 16  Temp: 36.6 C 36.7 C  SpO2: 98% 96%    Last Pain:  Vitals:   05/15/18 0930  TempSrc:   PainSc: 0-No pain                 Roshni Burbano

## 2018-05-16 DIAGNOSIS — M71161 Other infective bursitis, right knee: Principal | ICD-10-CM

## 2018-05-16 LAB — CULTURE, BLOOD (ROUTINE X 2)
CULTURE: NO GROWTH
CULTURE: NO GROWTH
SPECIAL REQUESTS: ADEQUATE
Special Requests: ADEQUATE

## 2018-05-16 LAB — BASIC METABOLIC PANEL
Anion gap: 14 (ref 5–15)
BUN: 36 mg/dL — AB (ref 8–23)
CHLORIDE: 97 mmol/L — AB (ref 98–111)
CO2: 21 mmol/L — AB (ref 22–32)
CREATININE: 1.73 mg/dL — AB (ref 0.61–1.24)
Calcium: 8.2 mg/dL — ABNORMAL LOW (ref 8.9–10.3)
GFR calc Af Amer: 39 mL/min — ABNORMAL LOW (ref >60)
GFR calc non Af Amer: 34 mL/min — ABNORMAL LOW (ref >60)
Glucose, Bld: 119 mg/dL — ABNORMAL HIGH (ref 70–99)
POTASSIUM: 4.2 mmol/L (ref 3.5–5.1)
Sodium: 132 mmol/L — ABNORMAL LOW (ref 135–145)

## 2018-05-16 MED ORDER — OXYCODONE-ACETAMINOPHEN 5-325 MG PO TABS: 1.0000 | ORAL_TABLET | ORAL | 0 refills | Status: AC | PRN

## 2018-05-16 MED ORDER — SULFAMETHOXAZOLE-TRIMETHOPRIM 400-80 MG PO TABS: 1.0000 | ORAL_TABLET | Freq: Two times a day (BID) | ORAL | 0 refills | Status: AC

## 2018-05-16 MED ORDER — APIXABAN 5 MG PO TABS: 5.0000 mg | ORAL_TABLET | Freq: Two times a day (BID) | ORAL | 0 refills | Status: DC

## 2018-05-16 MED ORDER — METOPROLOL SUCCINATE ER 25 MG PO TB24: 25.0000 mg | ORAL_TABLET | Freq: Every day | ORAL | 0 refills | Status: AC

## 2018-05-16 NOTE — Discharge Summary (Signed)
Physician Discharge Summary  Casey Lee YHC:[id number] DOB: 1928/03/29 DOA: 05/11/2018  PCP: [name], MD  Admit date: 05/11/2018 Discharge date: 05/16/2018  Admitted From: Home Disposition:  Home  Discharge Condition:Stable CODE STATUS:FULL Diet recommendation: Heart Healthy  Brief/Interim Summary:  Patient is a 83 year old male with a history of hyperlipidemia, stroke, temporal arteritis, CKD stage III who came to ED with right knee pain.  He was found to have septic prepatellar bursitis of right knee and underwent incision and drainage in the ED.  Started on empiric antibiotics.  Orthopedics was consulted.  Culture of the abscess grew methicillin sensitive staph aureus.  Antibiotics has been changed to oral.  Patient was initially recommended to be discharged to skilled nursing facility by PT but he denied.  He is medically stable to be discharged to home today.  Following problems were addressed during his hospitalization:  1. Septic prepatellar bursitis of right knee-status post excision of right prepatellar bursa, irrigation debridement per Dr. Mardelle Matte.  Culture from the abscess growing methicillin sensitive staph aureus.  IV antibiotics changed to p.o. Bactrim.   2. Acute on chronic diastolic CHF-patient has grade 1 diastolic dysfunction, appears lethargic since yesterday's incision and drainage.  He was started on IV Lasix 20 mg daily lower with excellent diuresis.  Will discontinue IV Lasix at this time. 3. Atrial flutter-rate controlled, EKG showed atrial flutter with controlled heart rate.  Patient started on Eliquis. CHA2DS2VASc score is around 4.  He does have a history of CVA.  Also started on low-dose Toprol-XL 25 mg p.o.  4. History of CVA-continue Eliquis, Crestor. 5. Acute kidney injury on CKD stage III-patient's baseline creatinine is 1.18.  Started was on IV Lasix for mild fluid overload and it has been discontinued. Check BMP in a week. 6. Temporal  arteritis-patient is on  prednisone 5 mg daily.  Continue home dose of prednisone, patient also received 1 dose of Solu-Cortef 50 mg x 1 in the ED.   Discharge Diagnoses:  Principal Problem:   Septic prepatellar bursitis of right knee Active Problems:   Cerebral infarction ([place]) - incidental, punctate R periventricular infarct   HLD (hyperlipidemia)   Acute renal failure superimposed on stage 3 chronic kidney disease (HCC)   Temporal arteritis (HCC)   Right knee pain    Discharge Instructions  Discharge Instructions    Diet - low sodium heart healthy   Complete by:  As directed    Discharge instructions   Complete by:  As directed    1) Follow up with orthopedics as an outpatient. 2) Take prescribed medications as instructed. 3) Follow-up with your PCP as an outpatient in a week.  Do CBC and BMP tests during the follow up.   Increase activity slowly   Complete by:  As directed      Allergies as of 05/16/2018   No Known Allergies     Medication List    STOP taking these medications   aspirin EC 81 MG tablet   cephALEXin 500 MG capsule Commonly known as:  KEFLEX   dipyridamole-aspirin 200-25 MG 12hr capsule Commonly known as:  AGGRENOX     TAKE these medications   alendronate 70 MG tablet Commonly known as:  FOSAMAX Take 70 mg by mouth every Monday.   apixaban 5 MG Tabs tablet Commonly known as:  ELIQUIS Take 1 tablet (5 mg total) by mouth 2 (two) times daily.   COQ10 PO Take 1 tablet by mouth daily.   diphenhydrAMINE 25 mg capsule  Commonly known as:  BENADRYL Take 25 mg by mouth daily as needed for itching.   loratadine 10 MG tablet Commonly known as:  CLARITIN Take 10 mg by mouth daily.   metoprolol succinate 25 MG 24 hr tablet Commonly known as:  TOPROL-XL Take 1 tablet (25 mg total) by mouth daily. Start taking on:  05/17/2018   predniSONE 5 MG tablet Commonly known as:  DELTASONE Take 5 mg by mouth daily.   rosuvastatin 10 MG tablet Commonly  known as:  CRESTOR Take 10 mg by mouth daily.   sulfamethoxazole-trimethoprim 400-80 MG tablet Commonly known as:  BACTRIM,SEPTRA Take 1 tablet by mouth every 12 (twelve) hours for 7 days.   TURMERIC PO Take 1 capsule by mouth daily.      Follow-up Information    Marchia Bond, MD. Schedule an appointment as soon as possible for a visit in 1 week.   Specialty:  Orthopedic Surgery Contact information: [address] [place] [address] [phone]        [facility] Follow up.   Specialty:  [place] Why:  Home Health RN, Physical Therapy, and aide-agency will call to arrange appointment Contact information: [address] [place] [address] [phone]        Marchia Bond, MD. Schedule an appointment as soon as possible for a visit in 1 week.   Specialty:  Orthopedic Surgery Contact information: [address] [place] [address] [phone]        [name], MD. Schedule an appointment as soon as possible for a visit in 1 week(s).   Specialty:  Endocrinology Contact information: [address] [place] [address] [phone]          No Known Allergies  Consultations:  Orthopedics   Procedures/Studies: Dg Chest 2 View  Result Date: 05/11/2018 CLINICAL DATA:  Dyspnea on exertion.  Ex-smoker. EXAM: CHEST - 2 VIEW COMPARISON:  01/24/2016. FINDINGS: Normal sized heart. Clear lungs. The lungs are mildly hyperexpanded with mild diffuse prominence of the interstitial markings. Thoracic spine degenerative changes. IMPRESSION: No acute abnormality.  Mild changes of COPD. Electronically Signed   By: Claudie Revering M.D.   On: 05/11/2018 16:42   Dg Knee Complete 4 Views Right  Result Date: 05/11/2018 CLINICAL DATA:  Right knee redness, warmth and swelling. EXAM: RIGHT KNEE - COMPLETE 4+ VIEW COMPARISON:  None. FINDINGS: Prepatellar soft tissue swelling.  Moderate-sized superior patellar enthesophyte. Moderate-sized anterior tibial spine enthesophyte. Medial and lateral meniscal calcification. No effusion. Mild atheromatous arterial calcifications. IMPRESSION: 1. Prepatellar soft tissue swelling.  This could be due to bursitis. 2. No effusion. 3. Chondrocalcinosis. Electronically Signed   By: Claudie Revering M.D.   On: 05/11/2018 16:43       Subjective: Patient seen and examined the bedside this morning.  Remains comfortable.  No new issues/events.  Stable for discharge home today.  Discharge Exam: Vitals:   05/15/18 2038 05/16/18 0451  BP: 102/82 (!) 122/50  Pulse: 92 86  Resp: 16 14  Temp: 97.7 F (36.5 C) 98.4 F (36.9 C)  SpO2: 95% 98%   Vitals:   05/15/18 0422 05/15/18 1449 05/15/18 2038 05/16/18 0451  BP: 115/74 110/63 102/82 (!) 122/50  Pulse: (!) 107 100 92 86  Resp: 16 16 16 14   Temp: 98 F (36.7 C) 97.8 F (36.6 C) 97.7 F (36.5 C) 98.4 F (36.9 C)  TempSrc: Oral Oral Oral Oral  SpO2: 96% 95%  95% 98%  Weight:      Height:        General: Pt is alert, awake, not in acute distress Cardiovascular: RRR, S1/S2 +, no rubs, no gallops Respiratory: CTA bilaterally, no wheezing, no rhonchi Abdominal: Soft, NT, ND, bowel sounds + Extremities: trace edema, no cyanosis, right knee wrapped with dressings.    The results of significant diagnostics from this hospitalization (including imaging, microbiology, ancillary and laboratory) are listed below for reference.     Microbiology: Recent Results (from the past 240 hour(s))  Blood culture (routine x 2)     Status: None (Preliminary result)   Collection Time: 05/11/18  3:43 PM  Result Value Ref Range Status   Specimen Description BLOOD RIGHT ANTECUBITAL  Final   Special Requests   Final    BOTTLES DRAWN AEROBIC AND ANAEROBIC Blood Culture adequate volume   Culture   Final    NO GROWTH 4 DAYS Performed at [facility], [address], [place], [place] [address]     Report Status PENDING  Incomplete  Blood culture (routine x 2)     Status: None (Preliminary result)   Collection Time: 05/11/18  3:48 PM  Result Value Ref Range Status   Specimen Description BLOOD LEFT ANTECUBITAL  Final   Special Requests   Final    BOTTLES DRAWN AEROBIC AND ANAEROBIC Blood Culture adequate volume   Culture   Final    NO GROWTH 4 DAYS Performed at [facility], [place] [address], [place], [place] [address]    Report Status PENDING  Incomplete  Wound or Superficial Culture     Status: None   Collection Time: 05/11/18  7:28 PM  Result Value Ref Range Status   Specimen Description KNEE BURSA  Final   Special Requests   Final    Normal Performed at [facility], [place] [address] [place], [place] [address]    Gram Stain NO WBC SEEN RARE GRAM POSITIVE COCCI   Final   Culture FEW STAPHYLOCOCCUS AUREUS  Final   Report Status 05/14/2018 FINAL  Final   Organism ID, Bacteria STAPHYLOCOCCUS AUREUS  Final      Susceptibility   Staphylococcus aureus - MIC*    CIPROFLOXACIN <=0.5 SENSITIVE Sensitive     ERYTHROMYCIN <=0.25 SENSITIVE Sensitive     GENTAMICIN <=0.5 SENSITIVE Sensitive     OXACILLIN 0.5 SENSITIVE Sensitive     TETRACYCLINE <=1 SENSITIVE Sensitive     VANCOMYCIN 1 SENSITIVE Sensitive     TRIMETH/SULFA <=10 SENSITIVE Sensitive     CLINDAMYCIN <=0.25 SENSITIVE Sensitive     RIFAMPIN <=0.5 SENSITIVE Sensitive     Inducible Clindamycin NEGATIVE Sensitive     * FEW STAPHYLOCOCCUS AUREUS  Aerobic/Anaerobic Culture (surgical/deep wound)     Status: None (Preliminary result)   Collection Time: 05/12/18  4:09 PM  Result Value Ref Range Status   Specimen Description TISSUE RIGHT KNEE PRE PATELLA BURSA  Final   Special Requests NONE  Final   Gram Stain   Final    FEW WBC PRESENT, PREDOMINANTLY PMN FEW GRAM POSITIVE COCCI IN PAIRS Performed at [facility], [address] [place], [place] [address]    Culture   Final    FEW STAPHYLOCOCCUS  AUREUS NO ANAEROBES ISOLATED; CULTURE IN PROGRESS FOR 5 DAYS    Report Status PENDING  Incomplete   Organism ID, Bacteria STAPHYLOCOCCUS AUREUS  Final      Susceptibility   Staphylococcus aureus -  MIC*    CIPROFLOXACIN <=0.5 SENSITIVE Sensitive     ERYTHROMYCIN <=0.25 SENSITIVE Sensitive     GENTAMICIN <=0.5 SENSITIVE Sensitive     OXACILLIN 0.5 SENSITIVE Sensitive     TETRACYCLINE <=1 SENSITIVE Sensitive     VANCOMYCIN 1 SENSITIVE Sensitive     TRIMETH/SULFA <=10 SENSITIVE Sensitive     CLINDAMYCIN <=0.25 SENSITIVE Sensitive     RIFAMPIN <=0.5 SENSITIVE Sensitive     Inducible Clindamycin NEGATIVE Sensitive     * FEW STAPHYLOCOCCUS AUREUS     Labs: BNP (last 3 results) Recent Labs    05/11/18 2212  BNP 170.0*   Basic Metabolic Panel: Recent Labs  Lab 05/12/18 0410 05/13/18 0515 05/14/18 0325 05/15/18 0358 05/16/18 0514  NA 135 137 135 135 132*  K 4.3 4.1 3.8 3.6 4.2  CL 107 108 107 100 97*  CO2 20* 18* 20* 22 21*  GLUCOSE 178* 152* 107* 112* 119*  BUN 31* 26* 26* 24* 36*  CREATININE 1.42* 1.29* 1.36* 1.65* 1.73*  CALCIUM 8.0* 7.7* 7.6* 8.0* 8.2*  MG  --   --   --  1.7  --    Liver Function Tests: Recent Labs  Lab 05/11/18 1537  AST 26  ALT 30  ALKPHOS 89  BILITOT 1.2  PROT 5.9*  ALBUMIN 2.9*   No results for input(s): LIPASE, AMYLASE in the last 168 hours. No results for input(s): AMMONIA in the last 168 hours. CBC: Recent Labs  Lab 05/11/18 1537 05/12/18 0410 05/13/18 0515 05/15/18 0358  WBC 11.6* 8.1 8.8 9.0  NEUTROABS 10.7*  --   --   --   HGB 12.5* 10.4* 10.1* 11.3*  HCT 40.0 32.3* 31.8* 34.4*  MCV 99.3 96.1 98.1 93.7  PLT PLATELET CLUMPS NOTED ON SMEAR, UNABLE TO ESTIMATE 150 142* 192   Cardiac Enzymes: No results for input(s): CKTOTAL, CKMB, CKMBINDEX, TROPONINI in the last 168 hours. BNP: Invalid input(s): POCBNP CBG: No results for input(s): GLUCAP in the last 168 hours. D-Dimer No results for input(s): DDIMER in the last 72  hours. Hgb A1c No results for input(s): HGBA1C in the last 72 hours. Lipid Profile No results for input(s): CHOL, HDL, LDLCALC, TRIG, CHOLHDL, LDLDIRECT in the last 72 hours. Thyroid function studies No results for input(s): TSH, T4TOTAL, T3FREE, THYROIDAB in the last 72 hours.  Invalid input(s): FREET3 Anemia work up No results for input(s): VITAMINB12, FOLATE, FERRITIN, TIBC, IRON, RETICCTPCT in the last 72 hours. Urinalysis    Component Value Date/Time   COLORURINE STRAW (A) 05/11/2018 2000   APPEARANCEUR CLEAR 05/11/2018 2000   LABSPEC 1.005 05/11/2018 2000   PHURINE 6.0 05/11/2018 2000   GLUCOSEU NEGATIVE 05/11/2018 2000   HGBUR NEGATIVE 05/11/2018 2000   [place] NEGATIVE 05/11/2018 2000   KETONESUR NEGATIVE 05/11/2018 2000   PROTEINUR NEGATIVE 05/11/2018 2000   NITRITE NEGATIVE 05/11/2018 2000   LEUKOCYTESUR NEGATIVE 05/11/2018 2000   Sepsis Labs Invalid input(s): PROCALCITONIN,  WBC,  LACTICIDVEN Microbiology Recent Results (from the past 240 hour(s))  Blood culture (routine x 2)     Status: None (Preliminary result)   Collection Time: 05/11/18  3:43 PM  Result Value Ref Range Status   Specimen Description BLOOD RIGHT ANTECUBITAL  Final   Special Requests   Final    BOTTLES DRAWN AEROBIC AND ANAEROBIC Blood Culture adequate volume   Culture   Final    NO GROWTH 4 DAYS Performed at [facility], [place] [address], [place], [place] [address]  Report Status PENDING  Incomplete  Blood culture (routine x 2)     Status: None (Preliminary result)   Collection Time: 05/11/18  3:48 PM  Result Value Ref Range Status   Specimen Description BLOOD LEFT ANTECUBITAL  Final   Special Requests   Final    BOTTLES DRAWN AEROBIC AND ANAEROBIC Blood Culture adequate volume   Culture   Final    NO GROWTH 4 DAYS Performed at [facility], [address] [place], [place] [address]    Report Status PENDING  Incomplete  Wound or Superficial Culture     Status: None    Collection Time: 05/11/18  7:28 PM  Result Value Ref Range Status   Specimen Description KNEE BURSA  Final   Special Requests   Final    Normal Performed at [facility], [place] [address], [place], [place] [address]    Gram Stain NO WBC SEEN RARE GRAM POSITIVE COCCI   Final   Culture FEW STAPHYLOCOCCUS AUREUS  Final   Report Status 05/14/2018 FINAL  Final   Organism ID, Bacteria STAPHYLOCOCCUS AUREUS  Final      Susceptibility   Staphylococcus aureus - MIC*    CIPROFLOXACIN <=0.5 SENSITIVE Sensitive     ERYTHROMYCIN <=0.25 SENSITIVE Sensitive     GENTAMICIN <=0.5 SENSITIVE Sensitive     OXACILLIN 0.5 SENSITIVE Sensitive     TETRACYCLINE <=1 SENSITIVE Sensitive     VANCOMYCIN 1 SENSITIVE Sensitive     TRIMETH/SULFA <=10 SENSITIVE Sensitive     CLINDAMYCIN <=0.25 SENSITIVE Sensitive     RIFAMPIN <=0.5 SENSITIVE Sensitive     Inducible Clindamycin NEGATIVE Sensitive     * FEW STAPHYLOCOCCUS AUREUS  Aerobic/Anaerobic Culture (surgical/deep wound)     Status: None (Preliminary result)   Collection Time: 05/12/18  4:09 PM  Result Value Ref Range Status   Specimen Description TISSUE RIGHT KNEE PRE PATELLA BURSA  Final   Special Requests NONE  Final   Gram Stain   Final    FEW WBC PRESENT, PREDOMINANTLY PMN FEW GRAM POSITIVE COCCI IN PAIRS Performed at [facility], [address] [place], [place] [address]    Culture   Final    FEW STAPHYLOCOCCUS AUREUS NO ANAEROBES ISOLATED; CULTURE IN PROGRESS FOR 5 DAYS    Report Status PENDING  Incomplete   Organism ID, Bacteria STAPHYLOCOCCUS AUREUS  Final      Susceptibility   Staphylococcus aureus - MIC*    CIPROFLOXACIN <=0.5 SENSITIVE Sensitive     ERYTHROMYCIN <=0.25 SENSITIVE Sensitive     GENTAMICIN <=0.5 SENSITIVE Sensitive     OXACILLIN 0.5 SENSITIVE Sensitive     TETRACYCLINE <=1 SENSITIVE Sensitive     VANCOMYCIN 1 SENSITIVE Sensitive     TRIMETH/SULFA <=10 SENSITIVE Sensitive     CLINDAMYCIN <=0.25 SENSITIVE  Sensitive     RIFAMPIN <=0.5 SENSITIVE Sensitive     Inducible Clindamycin NEGATIVE Sensitive     * FEW STAPHYLOCOCCUS AUREUS    Please note: You were cared for by a hospitalist during your hospital stay. Once you are discharged, your primary care physician will handle any further medical issues. Please note that NO REFILLS for any discharge medications will be authorized once you are discharged, as it is imperative that you return to your primary care physician (or establish a relationship with a primary care physician if you do not have one) for your post hospital discharge needs so that they can reassess your need for medications and  monitor your lab values.    Time coordinating discharge: 40 minutes  SIGNED:   Shelly Coss, MD  [facility] 05/16/2018, 11:29 AM Pager [phone]  If 7PM-7AM, please contact night-coverage [email or web address] Password [email or web address]

## 2018-05-16 NOTE — Evaluation (Addendum)
Occupational Therapy Evaluation Patient Details Name: Casey Lee MRN: [id number] DOB: 02/22/1928 Today's Date: 05/16/2018    History of Present Illness Pt is a 84 yo male admitted through [facility] ED with right knee pain s/p 1 week. Pt was diagnosed with septic prepatellrar bursitis and underwent an I&D on 05/12/18. Pt developed atrial fibrillation after surgery and was started on medications to control. PMH significant for SDH, temporal ateritis, vertigo, CKD, HLD.    Clinical Impression   This 84 y/o male presents with the above. At baseline pt is independent with ADLs, iADLs and mobility, was working. Pt presenting with increased weakness, fatigue, and balance deficits impacting his functional mobility. Pt requiring heavy modA initially with mobility to bathroom using RW, suspect partly due to urgency but also due to weakness and noted some bil knee buckling; pt requires multimodal cues for safe use of RW and maintaining proximity to RW. Pt with improvements in mobility when exiting bathroom however continues to require modA (+2 utilized for safety) for dynamic stability. Pt currently requires minA for UB ADL, maxA for LB ADLs. Feel pt would greatly benefit from [place] SNF stay prior to return home as he is currently at an increased risk for falls. Noted pt has been adamantly refusing SNF and is planning to return home. Recommend SNF at time of discharge, though If pt choosing to return home recommend follow up [facility] services to maximize pt's safety and independence with ADLs and mobility. Recommend pt have 24hr supervision and hands on assist at home for increased safety if returning home. Son present this session and educating on need for hands on assist with mobility at this time, son verbalizing understanding.  Will continue to follow acutely to progress pt towards established OT goals.     Follow Up Recommendations  SNF;Supervision/Assistance - 24 hour(noted pt refusing SNF, recommend follow-up HHOT  if returning home )    Equipment Recommendations  3 in 1 bedside commode           Precautions / Restrictions Precautions Precautions: Fall Restrictions Weight Bearing Restrictions: Yes RLE Weight Bearing: Weight bearing as tolerated      Mobility Bed Mobility               General bed mobility comments: Sitting EOB upon arrival   Transfers Overall transfer level: Needs assistance Equipment used: Rolling walker (2 wheeled) Transfers: Sit to/from Stand Sit to Stand: Min assist         General transfer comment: Pt attempting to pull into standing and required cues for pushing from seated surface to improve safety with transitions. Assist to rise and steady at RW     Balance Overall balance assessment: Needs assistance Sitting-balance support: No upper extremity supported Sitting balance-Leahy Scale: Fair     Standing balance support: No upper extremity supported;Bilateral upper extremity supported Standing balance-Leahy Scale: Poor                             ADL either performed or assessed with clinical judgement   ADL Overall ADL's : Needs assistance/impaired Eating/Feeding: Modified independent;Sitting   Grooming: Set up;Sitting   Upper Body Bathing: Min guard;Sitting   Lower Body Bathing: Sit to/from stand   Upper Body Dressing : Minimal assistance;Sitting Upper Body Dressing Details (indicate cue type and reason): minA for sitting balance while donning overhead and button up shirt  Lower Body Dressing: Maximal assistance;Sit to/from stand Lower Body Dressing Details (indicate cue  type and reason): assist to thread LEs into boxers, steadying assist provided in standing while pt advances boxers over hips  Toilet Transfer: Moderate assistance;Ambulation;RW Toilet Transfer Details (indicate cue type and reason): pt very unsteady and with bil LEs buckling at times during mobility to bathroom, requires modA to remain upright and VCs for  maintaining close proximity to [employer]- Water quality scientist and Hygiene: Moderate assistance;Sit to/from stand       Functional mobility during ADLs: Moderate assistance;+2 for safety/equipment;Rolling walker General ADL Comments: Pt with increased weakness and instability during this session, requiring significant assist for mobility to bathroom; had +2 assist available when exiting bathroom for increased safety due to weakness. Son present during this session and providing education on pt's need for hands on assist at this time during all portions of mobility      Vision         Perception     Praxis      Pertinent Vitals/Pain Pain Assessment: Faces Faces Pain Scale: No hurt Pain Intervention(s): Monitored during session     Hand Dominance Right   Extremity/Trunk Assessment Upper Extremity Assessment Upper Extremity Assessment: Generalized weakness   Lower Extremity Assessment Lower Extremity Assessment: Defer to PT evaluation   Cervical / Trunk Assessment Cervical / Trunk Assessment: Kyphotic   Communication Communication Communication: No difficulties   Cognition Arousal/Alertness: Awake/alert Behavior During Therapy: WFL for tasks assessed/performed Overall Cognitive Status: Impaired/Different from baseline Area of Impairment: Safety/judgement                         Safety/Judgement: Decreased awareness of safety     General Comments: requires cues for safety during mobility    General Comments  pt's son present during session     Exercises     Shoulder Instructions      Home Living Family/patient expects to be discharged to:: Private residence Living Arrangements: Spouse/significant other Available Help at Discharge: Family Type of Home: House Home Access: Level entry     Home Layout: Multi-level Alternate Level Stairs-Number of Steps: 11 down to office/den, 3 up to main house Alternate Level Stairs-Rails: Right Bathroom  Shower/Tub: Occupational psychologist: Standard Bathroom Accessibility: Yes   Home Equipment: None          Prior Functioning/Environment Level of Independence: Independent        Comments: 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 Problem List: Decreased strength;Impaired balance (sitting and/or standing);Decreased activity tolerance;Decreased safety awareness      OT Treatment/Interventions: Self-care/ADL training;DME and/or AE instruction;Therapeutic activities;Balance training;Therapeutic exercise;Patient/family education    OT Goals(Current goals can be found in the care plan section) Acute Rehab OT Goals Patient Stated Goal: to get home and back to work OT Goal Formulation: With patient Time For Goal Achievement: 05/30/18 Potential to Achieve Goals: Good  OT Frequency: Min 2X/week   Barriers to D/C:            Co-evaluation              AM-PAC PT "6 Clicks" Daily Activity     Outcome Measure Help from another person eating meals?: None Help from another person taking care of personal grooming?: A Little Help from another person toileting, which includes using toliet, bedpan, or urinal?: A Lot Help from another person bathing (including washing, rinsing, drying)?: A Lot Help from another person to put on and taking off  regular upper body clothing?: A Little Help from another person to put on and taking off regular lower body clothing?: A Lot 6 Click Score: 16   End of Session Equipment Utilized During Treatment: Gait belt;Rolling walker Nurse Communication: Mobility status  Activity Tolerance: Patient tolerated treatment well Patient left: with call bell/phone within reach;Other (comment);with family/visitor present(sitting EOB )  OT Visit Diagnosis: Muscle weakness (generalized) (M62.81);Unsteadiness on feet (R26.81)                Time: 8811-0315 OT Time Calculation (min): 32 min Charges:  OT General Charges $OT Visit:  1 Visit OT Evaluation $OT Eval Moderate Complexity: 1 Mod OT Treatments $Self Care/Home Management : 8-22 mins  Lou Cal, OT Pager [phone] 05/16/2018  [name] 05/16/2018, 1:01 PM

## 2018-05-16 NOTE — Progress Notes (Addendum)
Patient ID: Casey Lee, male   DOB: 10-10-1927, 84 y.o.   MRN: [id number]     Subjective:  Patient reports pain as mild to moderate.  Patient reports that his knee does not hurt at all but he has overall body pain  Objective:   VITALS:   Vitals:   05/15/18 0422 05/15/18 1449 05/15/18 2038 05/16/18 0451  BP: 115/74 110/63 102/82 (!) 122/50  Pulse: (!) 107 100 92 86  Resp: 16 16 16 14   Temp: 98 F (36.7 C) 97.8 F (36.6 C) 97.7 F (36.5 C) 98.4 F (36.9 C)  TempSrc: Oral Oral Oral Oral  SpO2: 96% 95% 95% 98%  Weight:      Height:        ABD soft Sensation intact distally Dorsiflexion/Plantar flexion intact Incision: dressing C/D/I and no drainage   Lab Results  Component Value Date   WBC 9.0 05/15/2018   HGB 11.3 (L) 05/15/2018   HCT 34.4 (L) 05/15/2018   MCV 93.7 05/15/2018   PLT 192 05/15/2018   BMET    Component Value Date/Time   NA 132 (L) 05/16/2018 0514   K 4.2 05/16/2018 0514   CL 97 (L) 05/16/2018 0514   CO2 21 (L) 05/16/2018 0514   GLUCOSE 119 (H) 05/16/2018 0514   BUN 36 (H) 05/16/2018 0514   CREATININE 1.73 (H) 05/16/2018 0514   CALCIUM 8.2 (L) 05/16/2018 0514   GFRNONAA 34 (L) 05/16/2018 0514   GFRAA 39 (L) 05/16/2018 0514     Assessment/Plan: 4 Days Post-Op   Principal Problem:   Septic prepatellar bursitis of right knee Active Problems:   Cerebral infarction (HCC) - incidental, punctate R periventricular infarct   HLD (hyperlipidemia)   Acute renal failure superimposed on stage 3 chronic kidney disease (HCC)   Temporal arteritis (HCC)   Right knee pain   Advance diet Up with therapy Continue plan per medicine WBAT Dry dressing daily Follow up with DR Casey Lee within one week after [place], Casey Lee 05/16/2018, 8:17 AM  Discussed and agree with above. Plain daily wet to dry dressing changes.    Marchia Bond, MD Cell [phone]

## 2018-05-16 NOTE — Progress Notes (Signed)
PT Cancellation Note  Patient Details Name: Casey Lee MRN: [id number] DOB: Oct 11, 1927   Cancelled Treatment:    Reason Eval/Treat Not Completed: (P) Patient declined, no reason specified(Pt lying supine in bed attempting to get dressed for d/c home.  He refused PT treatment and retrial of stairs.  Pt educated on the importance of rehab to improve function.  Pt reports he will be following up with HH therapies and he wished to rest.  ) Discussed with son need for assistance when standing or walking. Son reports he will be at home with pt initially at d/c.     Cristela Blue 05/16/2018, 1:36 PM

## 2018-05-16 NOTE — Progress Notes (Signed)
OT Cancellation Note  Patient Details Name: Casey Lee MRN: [id number] DOB: 1928-01-09   Cancelled Treatment:    Reason Eval/Treat Not Completed: Other (comment); pt supine in bed and reporting increased nausea this AM, not able to tolerate eating breakfast. Pt requesting OT return at a later time. Will check back as able.  Lou Cal, OT Pager [phone] 05/16/2018   [name] 05/16/2018, 8:28 AM

## 2018-05-16 NOTE — Progress Notes (Signed)
Discharge instructions completed with pt and his son.  They verbalized understanding of the information.  Pt denies chest pain, shortness of breath, dizziness, lightheadedness, and n/v.  Pt discharged home.

## 2018-05-17 DIAGNOSIS — Z7901 Long term (current) use of anticoagulants: Secondary | ICD-10-CM | POA: Diagnosis not present

## 2018-05-17 DIAGNOSIS — Z8673 Personal history of transient ischemic attack (TIA), and cerebral infarction without residual deficits: Secondary | ICD-10-CM | POA: Diagnosis not present

## 2018-05-17 DIAGNOSIS — Z87891 Personal history of nicotine dependence: Secondary | ICD-10-CM | POA: Diagnosis not present

## 2018-05-17 DIAGNOSIS — N183 Chronic kidney disease, stage 3 (moderate): Secondary | ICD-10-CM | POA: Diagnosis not present

## 2018-05-17 DIAGNOSIS — I5033 Acute on chronic diastolic (congestive) heart failure: Secondary | ICD-10-CM | POA: Diagnosis not present

## 2018-05-17 DIAGNOSIS — Z7952 Long term (current) use of systemic steroids: Secondary | ICD-10-CM | POA: Diagnosis not present

## 2018-05-17 DIAGNOSIS — E785 Hyperlipidemia, unspecified: Secondary | ICD-10-CM | POA: Diagnosis not present

## 2018-05-17 DIAGNOSIS — M7041 Prepatellar bursitis, right knee: Secondary | ICD-10-CM | POA: Diagnosis not present

## 2018-05-17 DIAGNOSIS — M316 Other giant cell arteritis: Secondary | ICD-10-CM | POA: Diagnosis not present

## 2018-05-17 DIAGNOSIS — I4892 Unspecified atrial flutter: Secondary | ICD-10-CM | POA: Diagnosis not present

## 2018-05-17 LAB — AEROBIC/ANAEROBIC CULTURE (SURGICAL/DEEP WOUND)

## 2018-05-17 LAB — AEROBIC/ANAEROBIC CULTURE W GRAM STAIN (SURGICAL/DEEP WOUND)

## 2018-05-22 DIAGNOSIS — E785 Hyperlipidemia, unspecified: Secondary | ICD-10-CM | POA: Diagnosis not present

## 2018-05-22 DIAGNOSIS — I4892 Unspecified atrial flutter: Secondary | ICD-10-CM | POA: Diagnosis not present

## 2018-05-22 DIAGNOSIS — M7041 Prepatellar bursitis, right knee: Secondary | ICD-10-CM | POA: Diagnosis not present

## 2018-05-22 DIAGNOSIS — I5033 Acute on chronic diastolic (congestive) heart failure: Secondary | ICD-10-CM | POA: Diagnosis not present

## 2018-05-22 DIAGNOSIS — N183 Chronic kidney disease, stage 3 (moderate): Secondary | ICD-10-CM | POA: Diagnosis not present

## 2018-05-22 DIAGNOSIS — M316 Other giant cell arteritis: Secondary | ICD-10-CM | POA: Diagnosis not present

## 2018-05-24 ENCOUNTER — Ambulatory Visit (HOSPITAL_COMMUNITY)
Admit: 2018-05-24 | Discharge: 2018-05-24 | Disposition: A | Discharge status: Home / Self Care | Payer: Medicare Other | Source: Ambulatory Visit | Attending: Nurse Practitioner | Admitting: Nurse Practitioner

## 2018-05-24 ENCOUNTER — Encounter (HOSPITAL_COMMUNITY): Payer: Self-pay | Admitting: Nurse Practitioner

## 2018-05-24 ENCOUNTER — Telehealth (HOSPITAL_COMMUNITY): Payer: Self-pay | Admitting: *Deleted

## 2018-05-24 VITALS — BP 108/60 | HR 68 | Ht 67.75 in | Wt 141.4 lb

## 2018-05-24 DIAGNOSIS — I4892 Unspecified atrial flutter: Secondary | ICD-10-CM | POA: Insufficient documentation

## 2018-05-24 DIAGNOSIS — Z8249 Family history of ischemic heart disease and other diseases of the circulatory system: Secondary | ICD-10-CM | POA: Diagnosis not present

## 2018-05-24 DIAGNOSIS — Z79899 Other long term (current) drug therapy: Secondary | ICD-10-CM | POA: Diagnosis not present

## 2018-05-24 DIAGNOSIS — E7800 Pure hypercholesterolemia, unspecified: Secondary | ICD-10-CM | POA: Insufficient documentation

## 2018-05-24 DIAGNOSIS — I639 Cerebral infarction, unspecified: Secondary | ICD-10-CM | POA: Diagnosis not present

## 2018-05-24 DIAGNOSIS — H9190 Unspecified hearing loss, unspecified ear: Secondary | ICD-10-CM | POA: Diagnosis not present

## 2018-05-24 DIAGNOSIS — Z7983 Long term (current) use of bisphosphonates: Secondary | ICD-10-CM | POA: Insufficient documentation

## 2018-05-24 DIAGNOSIS — N183 Chronic kidney disease, stage 3 (moderate): Secondary | ICD-10-CM | POA: Diagnosis not present

## 2018-05-24 DIAGNOSIS — I480 Paroxysmal atrial fibrillation: Secondary | ICD-10-CM

## 2018-05-24 DIAGNOSIS — Z87891 Personal history of nicotine dependence: Secondary | ICD-10-CM | POA: Insufficient documentation

## 2018-05-24 DIAGNOSIS — M818 Other osteoporosis without current pathological fracture: Secondary | ICD-10-CM | POA: Diagnosis not present

## 2018-05-24 DIAGNOSIS — Z7901 Long term (current) use of anticoagulants: Secondary | ICD-10-CM | POA: Diagnosis not present

## 2018-05-24 DIAGNOSIS — Z7952 Long term (current) use of systemic steroids: Secondary | ICD-10-CM | POA: Diagnosis not present

## 2018-05-24 DIAGNOSIS — Z833 Family history of diabetes mellitus: Secondary | ICD-10-CM | POA: Insufficient documentation

## 2018-05-24 DIAGNOSIS — R262 Difficulty in walking, not elsewhere classified: Secondary | ICD-10-CM | POA: Diagnosis not present

## 2018-05-24 DIAGNOSIS — M71161 Other infective bursitis, right knee: Secondary | ICD-10-CM | POA: Diagnosis not present

## 2018-05-24 DIAGNOSIS — Z8673 Personal history of transient ischemic attack (TIA), and cerebral infarction without residual deficits: Secondary | ICD-10-CM | POA: Insufficient documentation

## 2018-05-24 DIAGNOSIS — I4891 Unspecified atrial fibrillation: Secondary | ICD-10-CM | POA: Diagnosis not present

## 2018-05-24 DIAGNOSIS — Z8261 Family history of arthritis: Secondary | ICD-10-CM | POA: Diagnosis not present

## 2018-05-24 DIAGNOSIS — M6281 Muscle weakness (generalized): Secondary | ICD-10-CM | POA: Diagnosis not present

## 2018-05-24 LAB — CBC WITH DIFFERENTIAL/PLATELET
BASOS ABS: 0 10*3/uL (ref 0.0–0.1)
BLASTS: 0 %
Band Neutrophils: 4 %
Basophils Relative: 0 %
Eosinophils Absolute: 0.3 10*3/uL (ref 0.0–0.7)
Eosinophils Relative: 3 %
HEMATOCRIT: 37 % — AB (ref 39.0–52.0)
Hemoglobin: 11.7 g/dL — ABNORMAL LOW (ref 13.0–17.0)
LYMPHS PCT: 12 %
Lymphs Abs: 1.1 10*3/uL (ref 0.7–4.0)
MCH: 31 pg (ref 26.0–34.0)
MCHC: 31.6 g/dL (ref 30.0–36.0)
MCV: 97.9 fL (ref 78.0–100.0)
METAMYELOCYTES PCT: 0 %
MONOS PCT: 6 %
Monocytes Absolute: 0.5 10*3/uL (ref 0.1–1.0)
Myelocytes: 0 %
NEUTROS ABS: 6.9 10*3/uL (ref 1.7–7.7)
Neutrophils Relative %: 75 %
OTHER: 0 %
Platelets: 347 10*3/uL (ref 150–400)
Promyelocytes Relative: 0 %
RBC: 3.78 MIL/uL — AB (ref 4.22–5.81)
RDW: 15 % (ref 11.5–15.5)
Smear Review: ADEQUATE
WBC: 8.8 10*3/uL (ref 4.0–10.5)
nRBC: 0 /100 WBC

## 2018-05-24 LAB — BASIC METABOLIC PANEL
ANION GAP: 9 (ref 5–15)
BUN: 21 mg/dL (ref 8–23)
CO2: 21 mmol/L — ABNORMAL LOW (ref 22–32)
Calcium: 8.5 mg/dL — ABNORMAL LOW (ref 8.9–10.3)
Chloride: 103 mmol/L (ref 98–111)
Creatinine, Ser: 2.19 mg/dL — ABNORMAL HIGH (ref 0.61–1.24)
GFR calc non Af Amer: 25 mL/min — ABNORMAL LOW (ref >60)
GFR, EST AFRICAN AMERICAN: 29 mL/min — AB (ref >60)
Glucose, Bld: 114 mg/dL — ABNORMAL HIGH (ref 70–99)
POTASSIUM: 4.3 mmol/L (ref 3.5–5.1)
SODIUM: 133 mmol/L — AB (ref 135–145)

## 2018-05-24 NOTE — Progress Notes (Signed)
Primary Care Physician: [name], MD Referring Physician: [facility] f/u    Casey Lee is a 84 y.o. male with a h/o CVA, CKD, stage 3, that was recently in [facility] with septic prepatellar bursitis fo rt knee and was found to be in new onset atrial flutter. With controlled v rate. He was started on low dose BB and Eliquis 5 mg bid for CHA2DS2VASc score of 4. He was sent to the afib clinic for f/u and today is in [place]. No bleeding issues with eliquis. He is currently in assisted living for rehab from knee surgery, but plans to return home.  Today, he denies symptoms of palpitations, chest pain, shortness of breath, orthopnea, PND, lower extremity edema, dizziness, presyncope, syncope, or neurologic sequela. The patient is tolerating medications without difficulties and is otherwise without complaint today.   Past Medical History:  Diagnosis Date  . Hearing loss   . High cholesterol   . Temporal arteritis ([place])   . TIA (transient ischemic attack)   . UTI (lower urinary tract infection)   . Vertigo    Past Surgical History:  Procedure Laterality Date  . ARTERY BIOPSY Right 04/30/2018   Procedure: BIOPSY TEMPORAL ARTERY;  Surgeon: Izora Gala, MD;  Location: [facility];  Service: ENT;  Laterality: Right;  . I&D EXTREMITY Right 05/12/2018   Procedure: IRRIGATION AND DEBRIDEMENT RIGHT KNEE BURSA;  Surgeon: Marchia Bond, MD;  Location: [facility];  Service: Orthopedics;  Laterality: Right;  . none      Current Outpatient Medications  Medication Sig Dispense Refill  . alendronate (FOSAMAX) 70 MG tablet Take 70 mg by mouth every Monday.   3  . apixaban (ELIQUIS) 5 MG TABS tablet Take 1 tablet (5 mg total) by mouth 2 (two) times daily. 60 tablet 0  . Coenzyme Q10 (COQ10 PO) Take 1 tablet by mouth daily.    . metoprolol succinate (TOPROL-XL) 25 MG 24 hr tablet Take 1 tablet (25 mg total) by mouth daily. 30 tablet 0  . predniSONE (DELTASONE) 5 MG tablet Take 5 mg by mouth daily.    .  rosuvastatin (CRESTOR) 10 MG tablet Take 10 mg by mouth daily.     . TURMERIC PO Take 1 capsule by mouth daily.     . diphenhydrAMINE (BENADRYL) 25 mg capsule Take 25 mg by mouth daily as needed for itching.     . loratadine (CLARITIN) 10 MG tablet Take 10 mg by mouth daily.     Marland Kitchen oxyCODONE-acetaminophen (PERCOCET) 5-325 MG tablet Take 1 tablet by mouth every 4 (four) hours as needed for severe pain. (Patient not taking: Reported on 05/24/2018) 15 tablet 0   No current facility-administered medications for this encounter.     No Known Allergies  Social History   Socioeconomic History  . Marital status: Married    Spouse name: Not on file  . Number of children: Not on file  . Years of education: Not on file  . Highest education level: Not on file  Occupational History  . Not on file  Social Needs  . Financial resource strain: Not on file  . Food insecurity:    Worry: Not on file    Inability: Not on file  . Transportation needs:    Medical: Not on file    Non-medical: Not on file  Tobacco Use  . Smoking status: Former Smoker    Types: Cigarettes    Last attempt to quit: 11/07/1952    Years since quitting:  65.5  . Smokeless tobacco: Never Used  Substance and Sexual Activity  . Alcohol use: No    Alcohol/week: 0.0 standard drinks  . Drug use: No  . Sexual activity: Not on file  Lifestyle  . Physical activity:    Days per week: Not on file    Minutes per session: Not on file  . Stress: Not on file  Relationships  . Social connections:    Talks on phone: Not on file    Gets together: Not on file    Attends religious service: Not on file    Active member of club or organization: Not on file    Attends meetings of clubs or organizations: Not on file    Relationship status: Not on file  . Intimate partner violence:    Fear of current or ex partner: Not on file    Emotionally abused: Not on file    Physically abused: Not on file    Forced sexual activity: Not on file    Other Topics Concern  . Not on file  Social History Narrative   Lives with wife in split level home.  Has 2 sons.     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     Currently works as a Engineer, site.    Family History  Problem Relation Age of Onset  . Rheum arthritis Father   . Heart failure Father        Deceased, 14  . Rheum arthritis Sister   . Diabetes Mellitus II Sister   . Healthy Son        x2    ROS- All systems are reviewed and negative except as per the HPI above  Physical Exam: Vitals:   05/24/18 1155  BP: 108/60  Pulse: 68  Weight: 64.1 kg  Height: 5' 7.75" (1.721 m)   Wt Readings from Last 3 Encounters:  05/24/18 64.1 kg  05/11/18 67.9 kg  04/30/18 68.9 kg    Labs: Lab Results  Component Value Date   NA 132 (L) 05/16/2018   K 4.2 05/16/2018   CL 97 (L) 05/16/2018   CO2 21 (L) 05/16/2018   GLUCOSE 119 (H) 05/16/2018   BUN 36 (H) 05/16/2018   CREATININE 1.73 (H) 05/16/2018   CALCIUM 8.2 (L) 05/16/2018   MG 1.7 05/15/2018   Lab Results  Component Value Date   INR 1.06 01/24/2016   Lab Results  Component Value Date   CHOL 203 (H) 01/25/2016   HDL 50 01/25/2016   LDLCALC 142 (H) 01/25/2016   TRIG 54 01/25/2016     GEN- The patient is well appearing, alert and oriented x 3 today.   Head- normocephalic, atraumatic Eyes-  Sclera clear, conjunctiva pink Ears- hearing intact Oropharynx- clear Neck- supple, no JVP Lymph- no cervical lymphadenopathy Lungs- Clear to ausculation bilaterally, normal work of breathing Heart- Regular rate and rhythm, no murmurs, rubs or gallops, PMI not laterally displaced GI- soft, NT, ND, + BS Extremities- no clubbing, cyanosis, or edema MS- no significant deformity or atrophy Skin- no rash or lesion Psych- euthymic mood, full affect Neuro- strength and sensation are intact  EKG- sinus rhythm at 68 bpm, pr int 180 ms, qrs int 84 ms, qtc 408 ms Epic records reviewed    Assessment and Plan: 1. Paroxysmal atrial  flutter Pt has returned to [facility] Continue 25 mg toprol xl daily  2. Chadsvasc score of 4 Continue eliquis 5 mg bid Advised to stop turmeric as it can have additional blood  thinning effects Bleeding precautions discussed  Cbc/bmet ordered  3.S/p septic prepatellar bursitis rt knee Although pt states that he is not having any pain form rt knee and he feels as though he is progressing from his surgery, he would like to have f/u with surgeon As courtesy to the pt, the call was made to surgeon's office and appointment given to the pt  F/u with PCP afib clinic as needed  [place]. Smriti Barkow, [facility] [address] [place], [place] [address] [phone]

## 2018-05-24 NOTE — Telephone Encounter (Signed)
Per Roderic Palau, NP pt creatinine 2.19 and pt dose of Eliquis should be decreased to 2.5 mg bid.   I called and spoke to rep at [facility], the care facility he is currently staying.  New order was given to split the 5 mg tablets in half and give 2.5 mg bid.  Nurse verbalized understanding.

## 2018-05-25 DIAGNOSIS — N183 Chronic kidney disease, stage 3 (moderate): Secondary | ICD-10-CM | POA: Diagnosis not present

## 2018-05-25 DIAGNOSIS — Z79899 Other long term (current) drug therapy: Secondary | ICD-10-CM | POA: Diagnosis not present

## 2018-05-25 DIAGNOSIS — M818 Other osteoporosis without current pathological fracture: Secondary | ICD-10-CM | POA: Diagnosis not present

## 2018-05-25 DIAGNOSIS — R262 Difficulty in walking, not elsewhere classified: Secondary | ICD-10-CM | POA: Diagnosis not present

## 2018-05-25 DIAGNOSIS — I4891 Unspecified atrial fibrillation: Secondary | ICD-10-CM | POA: Diagnosis not present

## 2018-05-25 DIAGNOSIS — I639 Cerebral infarction, unspecified: Secondary | ICD-10-CM | POA: Diagnosis not present

## 2018-05-25 DIAGNOSIS — D649 Anemia, unspecified: Secondary | ICD-10-CM | POA: Diagnosis not present

## 2018-05-25 DIAGNOSIS — M71161 Other infective bursitis, right knee: Secondary | ICD-10-CM | POA: Diagnosis not present

## 2018-05-28 DIAGNOSIS — I639 Cerebral infarction, unspecified: Secondary | ICD-10-CM | POA: Diagnosis not present

## 2018-05-28 DIAGNOSIS — M818 Other osteoporosis without current pathological fracture: Secondary | ICD-10-CM | POA: Diagnosis not present

## 2018-05-28 DIAGNOSIS — M71161 Other infective bursitis, right knee: Secondary | ICD-10-CM | POA: Diagnosis not present

## 2018-05-28 DIAGNOSIS — R262 Difficulty in walking, not elsewhere classified: Secondary | ICD-10-CM | POA: Diagnosis not present

## 2018-05-28 DIAGNOSIS — N183 Chronic kidney disease, stage 3 (moderate): Secondary | ICD-10-CM | POA: Diagnosis not present

## 2018-05-28 DIAGNOSIS — I4891 Unspecified atrial fibrillation: Secondary | ICD-10-CM | POA: Diagnosis not present

## 2018-05-29 DIAGNOSIS — I639 Cerebral infarction, unspecified: Secondary | ICD-10-CM | POA: Diagnosis not present

## 2018-05-29 DIAGNOSIS — I4891 Unspecified atrial fibrillation: Secondary | ICD-10-CM | POA: Diagnosis not present

## 2018-05-29 DIAGNOSIS — R262 Difficulty in walking, not elsewhere classified: Secondary | ICD-10-CM | POA: Diagnosis not present

## 2018-05-29 DIAGNOSIS — M71161 Other infective bursitis, right knee: Secondary | ICD-10-CM | POA: Diagnosis not present

## 2018-05-29 DIAGNOSIS — M818 Other osteoporosis without current pathological fracture: Secondary | ICD-10-CM | POA: Diagnosis not present

## 2018-05-29 DIAGNOSIS — N183 Chronic kidney disease, stage 3 (moderate): Secondary | ICD-10-CM | POA: Diagnosis not present

## 2018-05-30 DIAGNOSIS — M71161 Other infective bursitis, right knee: Secondary | ICD-10-CM | POA: Diagnosis not present

## 2018-05-30 DIAGNOSIS — N183 Chronic kidney disease, stage 3 (moderate): Secondary | ICD-10-CM | POA: Diagnosis not present

## 2018-05-30 DIAGNOSIS — I4891 Unspecified atrial fibrillation: Secondary | ICD-10-CM | POA: Diagnosis not present

## 2018-05-30 DIAGNOSIS — R262 Difficulty in walking, not elsewhere classified: Secondary | ICD-10-CM | POA: Diagnosis not present

## 2018-05-30 DIAGNOSIS — M818 Other osteoporosis without current pathological fracture: Secondary | ICD-10-CM | POA: Diagnosis not present

## 2018-05-30 DIAGNOSIS — I639 Cerebral infarction, unspecified: Secondary | ICD-10-CM | POA: Diagnosis not present

## 2018-05-31 DIAGNOSIS — N183 Chronic kidney disease, stage 3 (moderate): Secondary | ICD-10-CM | POA: Diagnosis not present

## 2018-05-31 DIAGNOSIS — M7041 Prepatellar bursitis, right knee: Secondary | ICD-10-CM | POA: Diagnosis not present

## 2018-05-31 DIAGNOSIS — I639 Cerebral infarction, unspecified: Secondary | ICD-10-CM | POA: Diagnosis not present

## 2018-05-31 DIAGNOSIS — M818 Other osteoporosis without current pathological fracture: Secondary | ICD-10-CM | POA: Diagnosis not present

## 2018-05-31 DIAGNOSIS — I4891 Unspecified atrial fibrillation: Secondary | ICD-10-CM | POA: Diagnosis not present

## 2018-05-31 DIAGNOSIS — R262 Difficulty in walking, not elsewhere classified: Secondary | ICD-10-CM | POA: Diagnosis not present

## 2018-05-31 DIAGNOSIS — M71161 Other infective bursitis, right knee: Secondary | ICD-10-CM | POA: Diagnosis not present

## 2018-06-01 DIAGNOSIS — M818 Other osteoporosis without current pathological fracture: Secondary | ICD-10-CM | POA: Diagnosis not present

## 2018-06-01 DIAGNOSIS — I639 Cerebral infarction, unspecified: Secondary | ICD-10-CM | POA: Diagnosis not present

## 2018-06-01 DIAGNOSIS — M71161 Other infective bursitis, right knee: Secondary | ICD-10-CM | POA: Diagnosis not present

## 2018-06-01 DIAGNOSIS — R262 Difficulty in walking, not elsewhere classified: Secondary | ICD-10-CM | POA: Diagnosis not present

## 2018-06-01 DIAGNOSIS — I4891 Unspecified atrial fibrillation: Secondary | ICD-10-CM | POA: Diagnosis not present

## 2018-06-01 DIAGNOSIS — M7041 Prepatellar bursitis, right knee: Secondary | ICD-10-CM | POA: Diagnosis not present

## 2018-06-01 DIAGNOSIS — N183 Chronic kidney disease, stage 3 (moderate): Secondary | ICD-10-CM | POA: Diagnosis not present

## 2018-06-04 DIAGNOSIS — M6281 Muscle weakness (generalized): Secondary | ICD-10-CM | POA: Diagnosis not present

## 2018-06-04 DIAGNOSIS — M818 Other osteoporosis without current pathological fracture: Secondary | ICD-10-CM | POA: Diagnosis not present

## 2018-06-04 DIAGNOSIS — I4891 Unspecified atrial fibrillation: Secondary | ICD-10-CM | POA: Diagnosis not present

## 2018-06-04 DIAGNOSIS — R262 Difficulty in walking, not elsewhere classified: Secondary | ICD-10-CM | POA: Diagnosis not present

## 2018-06-04 DIAGNOSIS — I639 Cerebral infarction, unspecified: Secondary | ICD-10-CM | POA: Diagnosis not present

## 2018-06-04 DIAGNOSIS — M71161 Other infective bursitis, right knee: Secondary | ICD-10-CM | POA: Diagnosis not present

## 2018-06-04 DIAGNOSIS — N183 Chronic kidney disease, stage 3 (moderate): Secondary | ICD-10-CM | POA: Diagnosis not present

## 2018-06-05 DIAGNOSIS — M71161 Other infective bursitis, right knee: Secondary | ICD-10-CM | POA: Diagnosis not present

## 2018-06-05 DIAGNOSIS — M818 Other osteoporosis without current pathological fracture: Secondary | ICD-10-CM | POA: Diagnosis not present

## 2018-06-05 DIAGNOSIS — N183 Chronic kidney disease, stage 3 (moderate): Secondary | ICD-10-CM | POA: Diagnosis not present

## 2018-06-05 DIAGNOSIS — D649 Anemia, unspecified: Secondary | ICD-10-CM | POA: Diagnosis not present

## 2018-06-05 DIAGNOSIS — R262 Difficulty in walking, not elsewhere classified: Secondary | ICD-10-CM | POA: Diagnosis not present

## 2018-06-05 DIAGNOSIS — Z79899 Other long term (current) drug therapy: Secondary | ICD-10-CM | POA: Diagnosis not present

## 2018-06-05 DIAGNOSIS — I639 Cerebral infarction, unspecified: Secondary | ICD-10-CM | POA: Diagnosis not present

## 2018-06-05 DIAGNOSIS — I4891 Unspecified atrial fibrillation: Secondary | ICD-10-CM | POA: Diagnosis not present

## 2018-06-06 DIAGNOSIS — M818 Other osteoporosis without current pathological fracture: Secondary | ICD-10-CM | POA: Diagnosis not present

## 2018-06-06 DIAGNOSIS — I4891 Unspecified atrial fibrillation: Secondary | ICD-10-CM | POA: Diagnosis not present

## 2018-06-06 DIAGNOSIS — R262 Difficulty in walking, not elsewhere classified: Secondary | ICD-10-CM | POA: Diagnosis not present

## 2018-06-06 DIAGNOSIS — N183 Chronic kidney disease, stage 3 (moderate): Secondary | ICD-10-CM | POA: Diagnosis not present

## 2018-06-06 DIAGNOSIS — I639 Cerebral infarction, unspecified: Secondary | ICD-10-CM | POA: Diagnosis not present

## 2018-06-06 DIAGNOSIS — M71161 Other infective bursitis, right knee: Secondary | ICD-10-CM | POA: Diagnosis not present

## 2018-06-08 ENCOUNTER — Other Ambulatory Visit: Payer: Self-pay

## 2018-06-08 ENCOUNTER — Emergency Department (HOSPITAL_COMMUNITY): Payer: Medicare Other

## 2018-06-08 ENCOUNTER — Encounter (HOSPITAL_COMMUNITY): Payer: Self-pay | Admitting: Emergency Medicine

## 2018-06-08 ENCOUNTER — Emergency Department (HOSPITAL_COMMUNITY)
Admission: EM | Admit: 2018-06-08 | Discharge: 2018-06-09 | Disposition: A | Discharge status: Home / Self Care | Payer: Medicare Other | Attending: Emergency Medicine | Admitting: Emergency Medicine

## 2018-06-08 DIAGNOSIS — X58XXXA Exposure to other specified factors, initial encounter: Secondary | ICD-10-CM | POA: Diagnosis not present

## 2018-06-08 DIAGNOSIS — S0303XA Dislocation of jaw, bilateral, initial encounter: Secondary | ICD-10-CM | POA: Insufficient documentation

## 2018-06-08 DIAGNOSIS — I129 Hypertensive chronic kidney disease with stage 1 through stage 4 chronic kidney disease, or unspecified chronic kidney disease: Secondary | ICD-10-CM | POA: Diagnosis not present

## 2018-06-08 DIAGNOSIS — M7989 Other specified soft tissue disorders: Secondary | ICD-10-CM | POA: Diagnosis not present

## 2018-06-08 DIAGNOSIS — S0301XA Dislocation of jaw, right side, initial encounter: Secondary | ICD-10-CM | POA: Diagnosis not present

## 2018-06-08 DIAGNOSIS — N183 Chronic kidney disease, stage 3 (moderate): Secondary | ICD-10-CM | POA: Diagnosis not present

## 2018-06-08 DIAGNOSIS — Y929 Unspecified place or not applicable: Secondary | ICD-10-CM | POA: Diagnosis not present

## 2018-06-08 DIAGNOSIS — Y999 Unspecified external cause status: Secondary | ICD-10-CM | POA: Insufficient documentation

## 2018-06-08 DIAGNOSIS — S0993XA Unspecified injury of face, initial encounter: Secondary | ICD-10-CM | POA: Diagnosis present

## 2018-06-08 DIAGNOSIS — R220 Localized swelling, mass and lump, head: Secondary | ICD-10-CM | POA: Diagnosis not present

## 2018-06-08 DIAGNOSIS — Z79899 Other long term (current) drug therapy: Secondary | ICD-10-CM | POA: Diagnosis not present

## 2018-06-08 DIAGNOSIS — Z87891 Personal history of nicotine dependence: Secondary | ICD-10-CM | POA: Diagnosis not present

## 2018-06-08 DIAGNOSIS — Y939 Activity, unspecified: Secondary | ICD-10-CM | POA: Diagnosis not present

## 2018-06-08 DIAGNOSIS — M25561 Pain in right knee: Secondary | ICD-10-CM | POA: Insufficient documentation

## 2018-06-08 DIAGNOSIS — S0300XA Dislocation of jaw, unspecified side, initial encounter: Secondary | ICD-10-CM

## 2018-06-08 DIAGNOSIS — R51 Headache: Secondary | ICD-10-CM | POA: Diagnosis not present

## 2018-06-08 LAB — COMPREHENSIVE METABOLIC PANEL
ALBUMIN: 3.1 g/dL — AB (ref 3.5–5.0)
ALK PHOS: 59 U/L (ref 38–126)
ALT: 19 U/L (ref 0–44)
AST: 19 U/L (ref 15–41)
Anion gap: 10 (ref 5–15)
BUN: 24 mg/dL — AB (ref 8–23)
CALCIUM: 8.8 mg/dL — AB (ref 8.9–10.3)
CO2: 23 mmol/L (ref 22–32)
CREATININE: 1.54 mg/dL — AB (ref 0.61–1.24)
Chloride: 103 mmol/L (ref 98–111)
GFR calc Af Amer: 45 mL/min — ABNORMAL LOW (ref >60)
GFR calc non Af Amer: 39 mL/min — ABNORMAL LOW (ref >60)
GLUCOSE: 173 mg/dL — AB (ref 70–99)
Potassium: 4.6 mmol/L (ref 3.5–5.1)
SODIUM: 136 mmol/L (ref 135–145)
TOTAL PROTEIN: 6.4 g/dL — AB (ref 6.5–8.1)
Total Bilirubin: 0.9 mg/dL (ref 0.3–1.2)

## 2018-06-08 LAB — CBC
HEMATOCRIT: 38.5 % — AB (ref 39.0–52.0)
Hemoglobin: 12 g/dL — ABNORMAL LOW (ref 13.0–17.0)
MCH: 31.1 pg (ref 26.0–34.0)
MCHC: 31.2 g/dL (ref 30.0–36.0)
MCV: 99.7 fL (ref 78.0–100.0)
Platelets: 216 10*3/uL (ref 150–400)
RBC: 3.86 MIL/uL — ABNORMAL LOW (ref 4.22–5.81)
RDW: 15.7 % — ABNORMAL HIGH (ref 11.5–15.5)
WBC: 12.8 10*3/uL — ABNORMAL HIGH (ref 4.0–10.5)

## 2018-06-08 LAB — SEDIMENTATION RATE: Sed Rate: 44 mm/hr — ABNORMAL HIGH (ref 0–16)

## 2018-06-08 NOTE — ED Triage Notes (Signed)
Pt presents with jaw pain with hx of temporal arteritis; currently on prednisone; pt also was in rehab for knee, R knee is swollen and tender

## 2018-06-09 ENCOUNTER — Encounter (HOSPITAL_COMMUNITY): Payer: Self-pay | Admitting: Emergency Medicine

## 2018-06-09 ENCOUNTER — Emergency Department (HOSPITAL_COMMUNITY): Payer: Medicare Other

## 2018-06-09 DIAGNOSIS — S0303XA Dislocation of jaw, bilateral, initial encounter: Secondary | ICD-10-CM | POA: Diagnosis not present

## 2018-06-09 DIAGNOSIS — R220 Localized swelling, mass and lump, head: Secondary | ICD-10-CM | POA: Diagnosis not present

## 2018-06-09 DIAGNOSIS — S0300XA Dislocation of jaw, unspecified side, initial encounter: Secondary | ICD-10-CM | POA: Diagnosis not present

## 2018-06-09 DIAGNOSIS — R51 Headache: Secondary | ICD-10-CM | POA: Diagnosis not present

## 2018-06-09 MED ORDER — KETOROLAC TROMETHAMINE 30 MG/ML IJ SOLN
15.0000 mg | Freq: Once | INTRAMUSCULAR | Status: AC
  Administered 2018-06-09: 15 mg via INTRAVENOUS
  Filled 2018-06-09: qty 1

## 2018-06-09 MED ORDER — IOHEXOL 300 MG/ML  SOLN
75.0000 mL | Freq: Once | INTRAMUSCULAR | Status: AC | PRN
  Administered 2018-06-09: 75 mL via INTRAVENOUS

## 2018-06-09 MED ORDER — SODIUM CHLORIDE 0.9 % IV SOLN
INTRAVENOUS | Status: DC
  Administered 2018-06-09: 01:00:00 via INTRAVENOUS

## 2018-06-09 NOTE — ED Notes (Signed)
Pt and son verbalized understanding of dc instructions, vss, nad. Pt escorted home by son at bedside

## 2018-06-09 NOTE — ED Notes (Signed)
Patient transported to X-ray 

## 2018-06-09 NOTE — ED Provider Notes (Signed)
[facility] EMERGENCY DEPARTMENT Provider Note   CSN: [id number] Arrival date & time: 06/08/18  2040     History   Chief Complaint Chief Complaint  Patient presents with  . Jaw Pain  . Knee Pain    HPI Casey Lee is a 84 y.o. male.  The history is provided by the patient.  Knee Pain   This is a chronic problem. The current episode started more than 1 week ago. The problem occurs constantly. The problem has been rapidly improving. The pain is present in the right knee (states he saw his orthopedic surgeon who is happy with the progress, no drainage.  ). The quality of the pain is described as aching. The pain is mild. Pertinent negatives include no stiffness and no itching. The symptoms are aggravated by activity. He has tried nothing for the symptoms. The treatment provided no relief. Family history is significant for no rheumatoid arthritis.  Illness  This is a new problem. The current episode started 2 days ago. The problem occurs constantly. The problem has not changed since onset.Pertinent negatives include no chest pain, no abdominal pain, no headaches and no shortness of breath. Nothing aggravates the symptoms. Nothing relieves the symptoms. He has tried nothing for the symptoms. The treatment provided no relief.  Has jaw pain for 2 days.  No f/c/r.  No swelling.  No dental pain.    Past Medical History:  Diagnosis Date  . Hearing loss   . High cholesterol   . Temporal arteritis ([place])   . TIA (transient ischemic attack)   . UTI (lower urinary tract infection)   . Vertigo     Patient Active Problem List   Diagnosis Date Noted  . Right knee pain 05/12/2018  . Acute renal failure superimposed on stage 3 chronic kidney disease ([place]) 05/11/2018  . Septic prepatellar bursitis of right knee 05/11/2018  . Temporal arteritis ([place])   . HLD (hyperlipidemia) 03/19/2016  . SDH (subdural hematoma) ([place]) 01/28/2016  . BPPV (benign paroxysmal positional  vertigo) 01/28/2016  . Cerebral infarction [facility]) - incidental, punctate R periventricular infarct 01/28/2016  . Essential hypertension 01/28/2016  . Hyperlipidemia LDL goal <70 01/28/2016  . Acute encephalopathy   . Traumatic hemorrhage of cerebrum with loss of consciousness ([place]) - tiny L caudate 01/24/2016    Past Surgical History:  Procedure Laterality Date  . ARTERY BIOPSY Right 04/30/2018   Procedure: BIOPSY TEMPORAL ARTERY;  Surgeon: Izora Gala, MD;  Location: [facility];  Service: ENT;  Laterality: Right;  . I&D EXTREMITY Right 05/12/2018   Procedure: IRRIGATION AND DEBRIDEMENT RIGHT KNEE BURSA;  Surgeon: Marchia Bond, MD;  Location: [facility];  Service: Orthopedics;  Laterality: Right;  . none          Home Medications    Prior to Admission medications   Medication Sig Start Date End Date Taking? Authorizing Provider  alendronate (FOSAMAX) 70 MG tablet Take 70 mg by mouth every Monday.  01/12/18   [provider]  apixaban (ELIQUIS) 5 MG TABS tablet Take 1 tablet (5 mg total) by mouth 2 (two) times daily. 05/16/18   Shelly Coss, MD  Coenzyme Q10 (COQ10 PO) Take 1 tablet by mouth daily.    [provider]  diphenhydrAMINE (BENADRYL) 25 mg capsule Take 25 mg by mouth daily as needed for itching.     [provider]  loratadine (CLARITIN) 10 MG tablet Take 10 mg by mouth daily.     [provider]  metoprolol succinate (TOPROL-XL) 25 MG 24 hr tablet Take 1 tablet (25 mg total) by mouth daily. 05/17/18   Shelly Coss, MD  oxyCODONE-acetaminophen (PERCOCET) 5-325 MG tablet Take 1 tablet by mouth every 4 (four) hours as needed for severe pain. Patient not taking: Reported on 05/24/2018 05/16/18 05/16/19  Shelly Coss, MD  predniSONE (DELTASONE) 5 MG tablet Take 5 mg by mouth daily. 11/03/17   [provider]  rosuvastatin (CRESTOR) 10 MG tablet Take 10 mg by mouth daily.  02/16/16   [provider]  TURMERIC PO  Take 1 capsule by mouth daily.     [provider]    Family History Family History  Problem Relation Age of Onset  . Rheum arthritis Father   . Heart failure Father        Deceased, 77  . Rheum arthritis Sister   . Diabetes Mellitus II Sister   . Healthy Son        x2    Social History Social History   Tobacco Use  . Smoking status: Former Smoker    Types: Cigarettes    Last attempt to quit: 11/07/1952    Years since quitting: 65.6  . Smokeless tobacco: Never Used  Substance Use Topics  . Alcohol use: No    Alcohol/week: 0.0 standard drinks  . Drug use: No     Allergies   Patient has no known allergies.   Review of Systems Review of Systems  HENT: Negative for congestion, dental problem, drooling, ear discharge, ear pain, facial swelling, sore throat, trouble swallowing and voice change.        Jaw pain  Respiratory: Negative for shortness of breath.   Cardiovascular: Negative for chest pain, palpitations and leg swelling.  Gastrointestinal: Negative for abdominal pain.  Musculoskeletal: Positive for arthralgias. Negative for stiffness.  Skin: Negative for itching.  Neurological: Negative for headaches.  All other systems reviewed and are negative.    Physical Exam Updated Vital Signs BP 104/61   Pulse 73   Temp 97.9 F (36.6 C) (Oral)   Resp 16   Ht 5\' 9"  (1.753 m)   Wt 72.1 kg   SpO2 95%   BMI 23.48 kg/m   Physical Exam  Constitutional: He is oriented to person, place, and time. He appears well-developed and well-nourished. No distress.  HENT:  Head: Normocephalic and atraumatic.  Nose: Nose normal.  Mouth/Throat: No oral lesions. No oropharyngeal exudate.  Limited mouth opening prior to procedure  Eyes: Pupils are equal, round, and reactive to light. Conjunctivae are normal.  Neck: Normal range of motion. Neck supple.  Cardiovascular: Normal rate, regular rhythm, normal heart sounds and intact distal pulses.  Pulmonary/Chest: Effort  normal and breath sounds normal. No stridor. No respiratory distress. He has no wheezes. He has no rales.  Abdominal: Soft. Bowel sounds are normal. He exhibits no mass. There is no tenderness. There is no rebound and no guarding.  Musculoskeletal: Normal range of motion.       Legs: Neurological: He is alert and oriented to person, place, and time. He displays normal reflexes.  Skin: Skin is warm and dry. Capillary refill takes less than 2 seconds.  Psychiatric: He has a normal mood and affect.  Nursing note and vitals reviewed.    ED Treatments / Results  Labs (all labs ordered are listed, but only abnormal results are displayed) Results for orders placed or performed during the hospital encounter of 06/08/18  CBC  Result Value  Ref Range   WBC 12.8 (H) 4.0 - 10.5 K/uL   RBC 3.86 (L) 4.22 - 5.81 MIL/uL   Hemoglobin 12.0 (L) 13.0 - 17.0 g/dL   HCT 38.5 (L) 39.0 - 52.0 %   MCV 99.7 78.0 - 100.0 fL   MCH 31.1 26.0 - 34.0 pg   MCHC 31.2 30.0 - 36.0 g/dL   RDW 15.7 (H) 11.5 - 15.5 %   Platelets 216 150 - 400 K/uL  Comprehensive metabolic panel  Result Value Ref Range   Sodium 136 135 - 145 mmol/L   Potassium 4.6 3.5 - 5.1 mmol/L   Chloride 103 98 - 111 mmol/L   CO2 23 22 - 32 mmol/L   Glucose, Bld 173 (H) 70 - 99 mg/dL   BUN 24 (H) 8 - 23 mg/dL   Creatinine, Ser 1.54 (H) 0.61 - 1.24 mg/dL   Calcium 8.8 (L) 8.9 - 10.3 mg/dL   Total Protein 6.4 (L) 6.5 - 8.1 g/dL   Albumin 3.1 (L) 3.5 - 5.0 g/dL   AST 19 15 - 41 U/L   ALT 19 0 - 44 U/L   Alkaline Phosphatase 59 38 - 126 U/L   Total Bilirubin 0.9 0.3 - 1.2 mg/dL   GFR calc non Af Amer 39 (L) >60 mL/min   GFR calc Af Amer 45 (L) >60 mL/min   Anion gap 10 5 - 15  Sedimentation rate  Result Value Ref Range   Sed Rate 44 (H) 0 - 16 mm/hr   Dg Mandible 4 Views  Result Date: 06/09/2018 CLINICAL DATA:  Bilateral anterior subluxation of the temporomandibular joints seen on previous CT. Postreduction. Patient is altered mental  status and was unable to tolerate positioning for images. Limited imaging was obtained. EXAM: MANDIBLE - 4+ VIEW COMPARISON:  CT maxillofacial 06/09/2018 FINDINGS: Examination is limited by patient positioning. No acute fracture is demonstrated to involve the mandibles. Multiple dental reconstructions are noted. The right mandibular head appears appropriately located within the temporomandibular joint. The left temporomandibular joint is not well demonstrated. Can't exclude residual subluxation. IMPRESSION: Limited examination. Right temporomandibular joint appears intact. Left temporomandibular joint is not well demonstrated and residual subluxation is not excluded. Electronically Signed   By: Lucienne Capers M.D.   On: 06/09/2018 05:28   Dg Chest 2 View  Result Date: 05/11/2018 CLINICAL DATA:  Dyspnea on exertion.  Ex-smoker. EXAM: CHEST - 2 VIEW COMPARISON:  01/24/2016. FINDINGS: Normal sized heart. Clear lungs. The lungs are mildly hyperexpanded with mild diffuse prominence of the interstitial markings. Thoracic spine degenerative changes. IMPRESSION: No acute abnormality.  Mild changes of COPD. Electronically Signed   By: Claudie Revering M.D.   On: 05/11/2018 16:42   Dg Knee 2 Views Right  Result Date: 06/08/2018 CLINICAL DATA:  Right knee pain, swelling and tenderness. Currently taking prednisone. EXAM: RIGHT KNEE - 1-2 VIEW COMPARISON:  05/11/2018. FINDINGS: Stable mild medial and lateral meniscal calcification. The previously seen prepatellar soft tissue swelling has resolved. Stable moderate sized anterior patellar enthesophyte and spurring at the anterior tibial tubercle. No effusion. IMPRESSION: 1. Resolved prepatellar soft tissue swelling. 2. Stable chondrocalcinosis. Electronically Signed   By: Claudie Revering M.D.   On: 06/08/2018 22:06   Ct Soft Tissue Neck W Contrast  Result Date: 06/09/2018 CLINICAL DATA:  Jaw swelling with facial pain. History of temporal arteritis. EXAM: CT MAXILLOFACIAL WITH  CONTRAST CT NECK WITH CONTRAST TECHNIQUE: Multidetector CT imaging of the neck and the maxillofacial structures was performed with intravenous contrast.  Multiplanar CT image reconstructions were also generated. CONTRAST:  42mL OMNIPAQUE IOHEXOL 300 MG/ML  SOLN COMPARISON:  None. FINDINGS: CT NECK FINDINGS PHARYNX AND LARYNX: --Nasopharynx: Fossae of Rosenmuller are clear. Normal adenoid tonsils for age. --Oral cavity and oropharynx: The palatine and lingual tonsils are normal. The visible oral cavity and floor of mouth are normal. --Hypopharynx: Normal vallecula and pyriform sinuses. --Larynx: Normal epiglottis and pre-epiglottic space. Normal aryepiglottic and vocal folds. --Retropharyngeal space: No abscess, effusion or lymphadenopathy. SALIVARY GLANDS: --Parotid: No mass lesion or inflammation. No sialolithiasis or ductal dilatation. --Submandibular: Symmetric without inflammation. No sialolithiasis or ductal dilatation. --Sublingual: Normal. No ranula or other visible lesion of the base of tongue and floor of mouth. THYROID: Normal. LYMPH NODES: No enlarged or abnormal density lymph nodes. VASCULAR: There is calcific atherosclerosis of the aortic arch and both carotid bifurcations. SKELETON: No bony spinal canal stenosis. No lytic or blastic lesions. Grade 1 C5-6 anterolisthesis. UPPER CHEST: Clear. OTHER: None. CT MAXILLOFACIAL FINDINGS Osseous: No facial fracture. Dental: Mild bilateral degenerative change of the temporomandibular joints. There is bilateral anterior subluxation of the temporomandibular joints, left worse than right. Multiple dental fillings but no periapical lucency. The hard palate is intact. Orbits: The globes are intact. Normal appearance of the intra- and extraconal fat. Symmetric extraocular muscles. Sinuses: No fluid levels or advanced mucosal thickening. Soft tissues: Normal visualized extracranial soft tissues. Limited intracranial: Normal. IMPRESSION: 1. Mild right and moderate left  anterior subluxation at the temporomandibular joints with left-greater-than-right degenerative changes. 2. No periapical abscess or other acute dental abnormality. 3. No acute abnormality of the soft tissues of the neck. 4. Bilateral carotid bifurcation and aortic atherosclerosis (ICD10-I70.0). Electronically Signed   By: Ulyses Jarred M.D.   On: 06/09/2018 02:59   Ct Maxillofacial W Contrast  Result Date: 06/09/2018 CLINICAL DATA:  Jaw swelling with facial pain. History of temporal arteritis. EXAM: CT MAXILLOFACIAL WITH CONTRAST CT NECK WITH CONTRAST TECHNIQUE: Multidetector CT imaging of the neck and the maxillofacial structures was performed with intravenous contrast. Multiplanar CT image reconstructions were also generated. CONTRAST:  70mL OMNIPAQUE IOHEXOL 300 MG/ML  SOLN COMPARISON:  None. FINDINGS: CT NECK FINDINGS PHARYNX AND LARYNX: --Nasopharynx: Fossae of Rosenmuller are clear. Normal adenoid tonsils for age. --Oral cavity and oropharynx: The palatine and lingual tonsils are normal. The visible oral cavity and floor of mouth are normal. --Hypopharynx: Normal vallecula and pyriform sinuses. --Larynx: Normal epiglottis and pre-epiglottic space. Normal aryepiglottic and vocal folds. --Retropharyngeal space: No abscess, effusion or lymphadenopathy. SALIVARY GLANDS: --Parotid: No mass lesion or inflammation. No sialolithiasis or ductal dilatation. --Submandibular: Symmetric without inflammation. No sialolithiasis or ductal dilatation. --Sublingual: Normal. No ranula or other visible lesion of the base of tongue and floor of mouth. THYROID: Normal. LYMPH NODES: No enlarged or abnormal density lymph nodes. VASCULAR: There is calcific atherosclerosis of the aortic arch and both carotid bifurcations. SKELETON: No bony spinal canal stenosis. No lytic or blastic lesions. Grade 1 C5-6 anterolisthesis. UPPER CHEST: Clear. OTHER: None. CT MAXILLOFACIAL FINDINGS Osseous: No facial fracture. Dental: Mild bilateral  degenerative change of the temporomandibular joints. There is bilateral anterior subluxation of the temporomandibular joints, left worse than right. Multiple dental fillings but no periapical lucency. The hard palate is intact. Orbits: The globes are intact. Normal appearance of the intra- and extraconal fat. Symmetric extraocular muscles. Sinuses: No fluid levels or advanced mucosal thickening. Soft tissues: Normal visualized extracranial soft tissues. Limited intracranial: Normal. IMPRESSION: 1. Mild right and moderate left anterior subluxation at the temporomandibular  joints with left-greater-than-right degenerative changes. 2. No periapical abscess or other acute dental abnormality. 3. No acute abnormality of the soft tissues of the neck. 4. Bilateral carotid bifurcation and aortic atherosclerosis (ICD10-I70.0). Electronically Signed   By: Ulyses Jarred M.D.   On: 06/09/2018 02:59   Dg Knee Complete 4 Views Right  Result Date: 05/11/2018 CLINICAL DATA:  Right knee redness, warmth and swelling. EXAM: RIGHT KNEE - COMPLETE 4+ VIEW COMPARISON:  None. FINDINGS: Prepatellar soft tissue swelling. Moderate-sized superior patellar enthesophyte. Moderate-sized anterior tibial spine enthesophyte. Medial and lateral meniscal calcification. No effusion. Mild atheromatous arterial calcifications. IMPRESSION: 1. Prepatellar soft tissue swelling.  This could be due to bursitis. 2. No effusion. 3. Chondrocalcinosis. Electronically Signed   By: Claudie Revering M.D.   On: 05/11/2018 16:43    EKG None  Radiology Dg Mandible 4 Views  Result Date: 06/09/2018 CLINICAL DATA:  Bilateral anterior subluxation of the temporomandibular joints seen on previous CT. Postreduction. Patient is altered mental status and was unable to tolerate positioning for images. Limited imaging was obtained. EXAM: MANDIBLE - 4+ VIEW COMPARISON:  CT maxillofacial 06/09/2018 FINDINGS: Examination is limited by patient positioning. No acute fracture is  demonstrated to involve the mandibles. Multiple dental reconstructions are noted. The right mandibular head appears appropriately located within the temporomandibular joint. The left temporomandibular joint is not well demonstrated. Can't exclude residual subluxation. IMPRESSION: Limited examination. Right temporomandibular joint appears intact. Left temporomandibular joint is not well demonstrated and residual subluxation is not excluded. Electronically Signed   By: Lucienne Capers M.D.   On: 06/09/2018 05:28   Dg Knee 2 Views Right  Result Date: 06/08/2018 CLINICAL DATA:  Right knee pain, swelling and tenderness. Currently taking prednisone. EXAM: RIGHT KNEE - 1-2 VIEW COMPARISON:  05/11/2018. FINDINGS: Stable mild medial and lateral meniscal calcification. The previously seen prepatellar soft tissue swelling has resolved. Stable moderate sized anterior patellar enthesophyte and spurring at the anterior tibial tubercle. No effusion. IMPRESSION: 1. Resolved prepatellar soft tissue swelling. 2. Stable chondrocalcinosis. Electronically Signed   By: Claudie Revering M.D.   On: 06/08/2018 22:06   Ct Soft Tissue Neck W Contrast  Result Date: 06/09/2018 CLINICAL DATA:  Jaw swelling with facial pain. History of temporal arteritis. EXAM: CT MAXILLOFACIAL WITH CONTRAST CT NECK WITH CONTRAST TECHNIQUE: Multidetector CT imaging of the neck and the maxillofacial structures was performed with intravenous contrast. Multiplanar CT image reconstructions were also generated. CONTRAST:  41mL OMNIPAQUE IOHEXOL 300 MG/ML  SOLN COMPARISON:  None. FINDINGS: CT NECK FINDINGS PHARYNX AND LARYNX: --Nasopharynx: Fossae of Rosenmuller are clear. Normal adenoid tonsils for age. --Oral cavity and oropharynx: The palatine and lingual tonsils are normal. The visible oral cavity and floor of mouth are normal. --Hypopharynx: Normal vallecula and pyriform sinuses. --Larynx: Normal epiglottis and pre-epiglottic space. Normal aryepiglottic and  vocal folds. --Retropharyngeal space: No abscess, effusion or lymphadenopathy. SALIVARY GLANDS: --Parotid: No mass lesion or inflammation. No sialolithiasis or ductal dilatation. --Submandibular: Symmetric without inflammation. No sialolithiasis or ductal dilatation. --Sublingual: Normal. No ranula or other visible lesion of the base of tongue and floor of mouth. THYROID: Normal. LYMPH NODES: No enlarged or abnormal density lymph nodes. VASCULAR: There is calcific atherosclerosis of the aortic arch and both carotid bifurcations. SKELETON: No bony spinal canal stenosis. No lytic or blastic lesions. Grade 1 C5-6 anterolisthesis. UPPER CHEST: Clear. OTHER: None. CT MAXILLOFACIAL FINDINGS Osseous: No facial fracture. Dental: Mild bilateral degenerative change of the temporomandibular joints. There is bilateral anterior subluxation of the temporomandibular joints,  left worse than right. Multiple dental fillings but no periapical lucency. The hard palate is intact. Orbits: The globes are intact. Normal appearance of the intra- and extraconal fat. Symmetric extraocular muscles. Sinuses: No fluid levels or advanced mucosal thickening. Soft tissues: Normal visualized extracranial soft tissues. Limited intracranial: Normal. IMPRESSION: 1. Mild right and moderate left anterior subluxation at the temporomandibular joints with left-greater-than-right degenerative changes. 2. No periapical abscess or other acute dental abnormality. 3. No acute abnormality of the soft tissues of the neck. 4. Bilateral carotid bifurcation and aortic atherosclerosis (ICD10-I70.0). Electronically Signed   By: Ulyses Jarred M.D.   On: 06/09/2018 02:59   Ct Maxillofacial W Contrast  Result Date: 06/09/2018 CLINICAL DATA:  Jaw swelling with facial pain. History of temporal arteritis. EXAM: CT MAXILLOFACIAL WITH CONTRAST CT NECK WITH CONTRAST TECHNIQUE: Multidetector CT imaging of the neck and the maxillofacial structures was performed with  intravenous contrast. Multiplanar CT image reconstructions were also generated. CONTRAST:  59mL OMNIPAQUE IOHEXOL 300 MG/ML  SOLN COMPARISON:  None. FINDINGS: CT NECK FINDINGS PHARYNX AND LARYNX: --Nasopharynx: Fossae of Rosenmuller are clear. Normal adenoid tonsils for age. --Oral cavity and oropharynx: The palatine and lingual tonsils are normal. The visible oral cavity and floor of mouth are normal. --Hypopharynx: Normal vallecula and pyriform sinuses. --Larynx: Normal epiglottis and pre-epiglottic space. Normal aryepiglottic and vocal folds. --Retropharyngeal space: No abscess, effusion or lymphadenopathy. SALIVARY GLANDS: --Parotid: No mass lesion or inflammation. No sialolithiasis or ductal dilatation. --Submandibular: Symmetric without inflammation. No sialolithiasis or ductal dilatation. --Sublingual: Normal. No ranula or other visible lesion of the base of tongue and floor of mouth. THYROID: Normal. LYMPH NODES: No enlarged or abnormal density lymph nodes. VASCULAR: There is calcific atherosclerosis of the aortic arch and both carotid bifurcations. SKELETON: No bony spinal canal stenosis. No lytic or blastic lesions. Grade 1 C5-6 anterolisthesis. UPPER CHEST: Clear. OTHER: None. CT MAXILLOFACIAL FINDINGS Osseous: No facial fracture. Dental: Mild bilateral degenerative change of the temporomandibular joints. There is bilateral anterior subluxation of the temporomandibular joints, left worse than right. Multiple dental fillings but no periapical lucency. The hard palate is intact. Orbits: The globes are intact. Normal appearance of the intra- and extraconal fat. Symmetric extraocular muscles. Sinuses: No fluid levels or advanced mucosal thickening. Soft tissues: Normal visualized extracranial soft tissues. Limited intracranial: Normal. IMPRESSION: 1. Mild right and moderate left anterior subluxation at the temporomandibular joints with left-greater-than-right degenerative changes. 2. No periapical abscess or  other acute dental abnormality. 3. No acute abnormality of the soft tissues of the neck. 4. Bilateral carotid bifurcation and aortic atherosclerosis (ICD10-I70.0). Electronically Signed   By: Ulyses Jarred M.D.   On: 06/09/2018 02:59    Procedures Reduction of dislocation Date/Time: 06/09/2018 6:42 AM Performed by: Veatrice Kells, MD Authorized by: Veatrice Kells, MD  Consent: Verbal consent obtained. Written consent not obtained. Risks and benefits: risks, benefits and alternatives were discussed Consent given by: patient Patient understanding: patient states understanding of the procedure being performed Patient identity confirmed: arm band Preparation: Patient was prepped and draped in the usual sterile fashion. Local anesthesia used: no  Anesthesia: Local anesthesia used: no  Sedation: Patient sedated: no  Comments: Reduction of subluxed jaw, FROM with normal opening and closing of the mouth and normal bite post procedure.      (including critical care time)  Medications Ordered in ED Medications  0.9 %  sodium chloride infusion ( Intravenous Stopped 06/09/18 0400)  iohexol (OMNIPAQUE) 300 MG/ML solution 75 mL (75 mLs Intravenous Contrast  Given 06/09/18 0217)  ketorolac (TORADOL) 30 MG/ML injection 15 mg (15 mg Intravenous Given 06/09/18 0409)     Initial Impression / Assessment and Plan / ED Course  I have reviewed the triage vital signs and the nursing notes.  Pertinent labs & imaging results that were available during my care of the patient were reviewed by me and considered in my medical decision making (see chart for details).  Clinical Course as of Jun 09 599  Sat Jun 09, 2018  0404 CT Maxillofacial W Contrast [JC]    Clinical Course User Index [JC] Brenton Grills, Student-PA      Final Clinical Impressions(s) / ED Diagnoses   Final diagnoses:  Right knee pain, unspecified chronicity  Jaw dislocation, initial encounter    Normal excursion of the jaw, full  opening of mouth, pain improved.  Follow up with ENT for ongoing issues.     Return for fevers >100.4 unrelieved by medication, shortness of breath, intractable vomiting, or diarrhea, Inability to tolerate liquids or food, cough, altered mental status or any concerns. No signs of systemic illness or infection. The patient is nontoxic-appearing on exam and vital signs are within normal limits.   I have reviewed the triage vital signs and the nursing notes. Pertinent labs &imaging results that were available during my care of the patient were reviewed by me and considered in my medical decision making (see chart for details).  After history, exam, and medical workup I feel the patient has been appropriately medically screened and is safe for discharge home. Pertinent diagnoses were discussed with the patient. Patient was given return precautions.     [place]il[place] Mcphee, MD 06/09/18 [phone]

## 2018-06-18 DIAGNOSIS — M7041 Prepatellar bursitis, right knee: Secondary | ICD-10-CM | POA: Diagnosis not present

## 2018-06-23 DIAGNOSIS — Z23 Encounter for immunization: Secondary | ICD-10-CM | POA: Diagnosis not present

## 2018-07-02 DIAGNOSIS — H04123 Dry eye syndrome of bilateral lacrimal glands: Secondary | ICD-10-CM | POA: Diagnosis not present

## 2018-07-02 DIAGNOSIS — H25813 Combined forms of age-related cataract, bilateral: Secondary | ICD-10-CM | POA: Diagnosis not present

## 2018-07-02 DIAGNOSIS — H35033 Hypertensive retinopathy, bilateral: Secondary | ICD-10-CM | POA: Diagnosis not present

## 2018-07-02 DIAGNOSIS — M315 Giant cell arteritis with polymyalgia rheumatica: Secondary | ICD-10-CM | POA: Diagnosis not present

## 2018-07-03 DIAGNOSIS — S0300XD Dislocation of jaw, unspecified side, subsequent encounter: Secondary | ICD-10-CM | POA: Diagnosis not present

## 2018-07-12 DIAGNOSIS — G459 Transient cerebral ischemic attack, unspecified: Secondary | ICD-10-CM | POA: Diagnosis not present

## 2018-07-12 DIAGNOSIS — Z7901 Long term (current) use of anticoagulants: Secondary | ICD-10-CM | POA: Diagnosis not present

## 2018-07-12 DIAGNOSIS — I4891 Unspecified atrial fibrillation: Secondary | ICD-10-CM | POA: Diagnosis not present

## 2018-07-16 DIAGNOSIS — M7041 Prepatellar bursitis, right knee: Secondary | ICD-10-CM | POA: Diagnosis not present

## 2018-07-30 DIAGNOSIS — H2513 Age-related nuclear cataract, bilateral: Secondary | ICD-10-CM | POA: Diagnosis not present

## 2018-07-30 DIAGNOSIS — H2511 Age-related nuclear cataract, right eye: Secondary | ICD-10-CM | POA: Diagnosis not present

## 2018-08-14 DIAGNOSIS — H2511 Age-related nuclear cataract, right eye: Secondary | ICD-10-CM | POA: Diagnosis not present

## 2018-08-14 DIAGNOSIS — H2181 Floppy iris syndrome: Secondary | ICD-10-CM | POA: Diagnosis not present

## 2018-08-14 DIAGNOSIS — H25811 Combined forms of age-related cataract, right eye: Secondary | ICD-10-CM | POA: Diagnosis not present

## 2018-09-10 DIAGNOSIS — H18831 Recurrent erosion of cornea, right eye: Secondary | ICD-10-CM | POA: Diagnosis not present

## 2018-09-17 DIAGNOSIS — Z961 Presence of intraocular lens: Secondary | ICD-10-CM | POA: Diagnosis not present

## 2018-09-28 DIAGNOSIS — H04123 Dry eye syndrome of bilateral lacrimal glands: Secondary | ICD-10-CM | POA: Diagnosis not present

## 2018-09-28 DIAGNOSIS — H5711 Ocular pain, right eye: Secondary | ICD-10-CM | POA: Diagnosis not present

## 2018-09-28 DIAGNOSIS — H168 Other keratitis: Secondary | ICD-10-CM | POA: Diagnosis not present

## 2018-09-28 DIAGNOSIS — M315 Giant cell arteritis with polymyalgia rheumatica: Secondary | ICD-10-CM | POA: Diagnosis not present

## 2018-11-07 DIAGNOSIS — Z7952 Long term (current) use of systemic steroids: Secondary | ICD-10-CM | POA: Diagnosis not present

## 2018-11-07 DIAGNOSIS — M810 Age-related osteoporosis without current pathological fracture: Secondary | ICD-10-CM | POA: Diagnosis not present

## 2018-11-07 DIAGNOSIS — R768 Other specified abnormal immunological findings in serum: Secondary | ICD-10-CM | POA: Diagnosis not present

## 2018-11-07 DIAGNOSIS — N189 Chronic kidney disease, unspecified: Secondary | ICD-10-CM | POA: Diagnosis not present

## 2018-11-07 DIAGNOSIS — R269 Unspecified abnormalities of gait and mobility: Secondary | ICD-10-CM | POA: Diagnosis not present

## 2018-11-12 DIAGNOSIS — H2512 Age-related nuclear cataract, left eye: Secondary | ICD-10-CM | POA: Diagnosis not present

## 2018-11-12 DIAGNOSIS — Z961 Presence of intraocular lens: Secondary | ICD-10-CM | POA: Diagnosis not present

## 2018-12-17 DIAGNOSIS — H2512 Age-related nuclear cataract, left eye: Secondary | ICD-10-CM | POA: Diagnosis not present

## 2018-12-17 DIAGNOSIS — I10 Essential (primary) hypertension: Secondary | ICD-10-CM | POA: Diagnosis not present

## 2018-12-17 DIAGNOSIS — Z961 Presence of intraocular lens: Secondary | ICD-10-CM | POA: Diagnosis not present

## 2018-12-17 DIAGNOSIS — H1859 Other hereditary corneal dystrophies: Secondary | ICD-10-CM | POA: Diagnosis not present

## 2019-01-02 DIAGNOSIS — H30141 Acute posterior multifocal placoid pigment epitheliopathy, right eye: Secondary | ICD-10-CM | POA: Diagnosis not present

## 2019-01-02 DIAGNOSIS — H2512 Age-related nuclear cataract, left eye: Secondary | ICD-10-CM | POA: Diagnosis not present

## 2019-01-02 DIAGNOSIS — Z9889 Other specified postprocedural states: Secondary | ICD-10-CM | POA: Diagnosis not present

## 2019-01-02 DIAGNOSIS — H25012 Cortical age-related cataract, left eye: Secondary | ICD-10-CM | POA: Diagnosis not present

## 2019-02-20 DIAGNOSIS — H16101 Unspecified superficial keratitis, right eye: Secondary | ICD-10-CM | POA: Diagnosis not present

## 2019-04-10 DIAGNOSIS — H168 Other keratitis: Secondary | ICD-10-CM | POA: Diagnosis not present

## 2019-04-10 DIAGNOSIS — M315 Giant cell arteritis with polymyalgia rheumatica: Secondary | ICD-10-CM | POA: Diagnosis not present

## 2019-04-10 DIAGNOSIS — H2512 Age-related nuclear cataract, left eye: Secondary | ICD-10-CM | POA: Diagnosis not present

## 2019-04-10 DIAGNOSIS — H04123 Dry eye syndrome of bilateral lacrimal glands: Secondary | ICD-10-CM | POA: Diagnosis not present

## 2019-05-01 DIAGNOSIS — H1820 Unspecified corneal edema: Secondary | ICD-10-CM | POA: Diagnosis not present

## 2019-05-01 DIAGNOSIS — Z961 Presence of intraocular lens: Secondary | ICD-10-CM | POA: Diagnosis not present

## 2019-05-01 DIAGNOSIS — H2512 Age-related nuclear cataract, left eye: Secondary | ICD-10-CM | POA: Diagnosis not present

## 2019-05-01 DIAGNOSIS — H16101 Unspecified superficial keratitis, right eye: Secondary | ICD-10-CM | POA: Diagnosis not present

## 2019-05-02 ENCOUNTER — Other Ambulatory Visit: Payer: Self-pay

## 2019-05-08 DIAGNOSIS — T148XXA Other injury of unspecified body region, initial encounter: Secondary | ICD-10-CM | POA: Diagnosis not present

## 2019-05-08 DIAGNOSIS — L219 Seborrheic dermatitis, unspecified: Secondary | ICD-10-CM | POA: Diagnosis not present

## 2019-05-08 DIAGNOSIS — S81802A Unspecified open wound, left lower leg, initial encounter: Secondary | ICD-10-CM | POA: Diagnosis not present

## 2019-05-28 DIAGNOSIS — L810 Postinflammatory hyperpigmentation: Secondary | ICD-10-CM | POA: Diagnosis not present

## 2019-06-03 DIAGNOSIS — Z7952 Long term (current) use of systemic steroids: Secondary | ICD-10-CM | POA: Diagnosis not present

## 2019-06-03 DIAGNOSIS — R269 Unspecified abnormalities of gait and mobility: Secondary | ICD-10-CM | POA: Diagnosis not present

## 2019-06-03 DIAGNOSIS — N189 Chronic kidney disease, unspecified: Secondary | ICD-10-CM | POA: Diagnosis not present

## 2019-06-03 DIAGNOSIS — R768 Other specified abnormal immunological findings in serum: Secondary | ICD-10-CM | POA: Diagnosis not present

## 2019-06-03 DIAGNOSIS — M810 Age-related osteoporosis without current pathological fracture: Secondary | ICD-10-CM | POA: Diagnosis not present

## 2019-06-05 DIAGNOSIS — T8529XD Other mechanical complication of intraocular lens, subsequent encounter: Secondary | ICD-10-CM | POA: Diagnosis not present

## 2019-06-05 DIAGNOSIS — H2512 Age-related nuclear cataract, left eye: Secondary | ICD-10-CM | POA: Diagnosis not present

## 2019-06-05 DIAGNOSIS — H1820 Unspecified corneal edema: Secondary | ICD-10-CM | POA: Diagnosis not present

## 2019-06-05 DIAGNOSIS — Z961 Presence of intraocular lens: Secondary | ICD-10-CM | POA: Diagnosis not present

## 2019-06-05 DIAGNOSIS — H16101 Unspecified superficial keratitis, right eye: Secondary | ICD-10-CM | POA: Diagnosis not present

## 2019-06-06 DIAGNOSIS — Z23 Encounter for immunization: Secondary | ICD-10-CM | POA: Diagnosis not present

## 2019-08-12 DIAGNOSIS — H04123 Dry eye syndrome of bilateral lacrimal glands: Secondary | ICD-10-CM | POA: Diagnosis not present

## 2019-08-12 DIAGNOSIS — H2512 Age-related nuclear cataract, left eye: Secondary | ICD-10-CM | POA: Diagnosis not present

## 2019-08-12 DIAGNOSIS — H168 Other keratitis: Secondary | ICD-10-CM | POA: Diagnosis not present

## 2019-08-12 DIAGNOSIS — H1811 Bullous keratopathy, right eye: Secondary | ICD-10-CM | POA: Diagnosis not present

## 2019-08-12 DIAGNOSIS — Z961 Presence of intraocular lens: Secondary | ICD-10-CM | POA: Diagnosis not present

## 2019-12-02 DIAGNOSIS — M810 Age-related osteoporosis without current pathological fracture: Secondary | ICD-10-CM | POA: Diagnosis not present

## 2019-12-02 DIAGNOSIS — Z7952 Long term (current) use of systemic steroids: Secondary | ICD-10-CM | POA: Diagnosis not present

## 2019-12-02 DIAGNOSIS — R269 Unspecified abnormalities of gait and mobility: Secondary | ICD-10-CM | POA: Diagnosis not present

## 2019-12-02 DIAGNOSIS — R5383 Other fatigue: Secondary | ICD-10-CM | POA: Diagnosis not present

## 2019-12-02 DIAGNOSIS — Z8679 Personal history of other diseases of the circulatory system: Secondary | ICD-10-CM | POA: Diagnosis not present

## 2019-12-02 DIAGNOSIS — N189 Chronic kidney disease, unspecified: Secondary | ICD-10-CM | POA: Diagnosis not present

## 2019-12-02 DIAGNOSIS — E039 Hypothyroidism, unspecified: Secondary | ICD-10-CM | POA: Diagnosis not present

## 2019-12-02 DIAGNOSIS — R768 Other specified abnormal immunological findings in serum: Secondary | ICD-10-CM | POA: Diagnosis not present

## 2020-03-13 DIAGNOSIS — Z961 Presence of intraocular lens: Secondary | ICD-10-CM | POA: Diagnosis not present

## 2020-03-13 DIAGNOSIS — T8529XD Other mechanical complication of intraocular lens, subsequent encounter: Secondary | ICD-10-CM | POA: Diagnosis not present

## 2020-03-13 DIAGNOSIS — H2512 Age-related nuclear cataract, left eye: Secondary | ICD-10-CM | POA: Diagnosis not present

## 2020-03-13 DIAGNOSIS — H16101 Unspecified superficial keratitis, right eye: Secondary | ICD-10-CM | POA: Diagnosis not present

## 2020-04-17 DIAGNOSIS — T8529XD Other mechanical complication of intraocular lens, subsequent encounter: Secondary | ICD-10-CM | POA: Diagnosis not present

## 2020-04-17 DIAGNOSIS — Z01818 Encounter for other preprocedural examination: Secondary | ICD-10-CM | POA: Diagnosis not present

## 2020-04-17 DIAGNOSIS — Z961 Presence of intraocular lens: Secondary | ICD-10-CM | POA: Diagnosis not present

## 2020-04-17 DIAGNOSIS — H16101 Unspecified superficial keratitis, right eye: Secondary | ICD-10-CM | POA: Diagnosis not present

## 2020-04-17 DIAGNOSIS — H2512 Age-related nuclear cataract, left eye: Secondary | ICD-10-CM | POA: Diagnosis not present

## 2020-04-28 DIAGNOSIS — Z8673 Personal history of transient ischemic attack (TIA), and cerebral infarction without residual deficits: Secondary | ICD-10-CM | POA: Diagnosis not present

## 2020-04-28 DIAGNOSIS — H25812 Combined forms of age-related cataract, left eye: Secondary | ICD-10-CM | POA: Diagnosis not present

## 2020-04-28 DIAGNOSIS — Z7901 Long term (current) use of anticoagulants: Secondary | ICD-10-CM | POA: Diagnosis not present

## 2020-04-28 DIAGNOSIS — I10 Essential (primary) hypertension: Secondary | ICD-10-CM | POA: Diagnosis not present

## 2020-04-28 HISTORY — PX: CATARACT EXTRACTION: SUR2

## 2020-04-29 DIAGNOSIS — Z961 Presence of intraocular lens: Secondary | ICD-10-CM | POA: Diagnosis not present

## 2020-04-29 DIAGNOSIS — Z48810 Encounter for surgical aftercare following surgery on the sense organs: Secondary | ICD-10-CM | POA: Diagnosis not present

## 2020-05-18 ENCOUNTER — Other Ambulatory Visit: Payer: Self-pay

## 2020-05-18 ENCOUNTER — Encounter (HOSPITAL_COMMUNITY): Payer: Self-pay | Admitting: *Deleted

## 2020-05-18 ENCOUNTER — Emergency Department (HOSPITAL_COMMUNITY)
Admission: EM | Admit: 2020-05-18 | Discharge: 2020-05-18 | Disposition: A | Discharge status: Home / Self Care | Payer: Medicare Other | Attending: Emergency Medicine | Admitting: Emergency Medicine

## 2020-05-18 DIAGNOSIS — Z79899 Other long term (current) drug therapy: Secondary | ICD-10-CM | POA: Insufficient documentation

## 2020-05-18 DIAGNOSIS — I129 Hypertensive chronic kidney disease with stage 1 through stage 4 chronic kidney disease, or unspecified chronic kidney disease: Secondary | ICD-10-CM | POA: Diagnosis not present

## 2020-05-18 DIAGNOSIS — X58XXXA Exposure to other specified factors, initial encounter: Secondary | ICD-10-CM | POA: Insufficient documentation

## 2020-05-18 DIAGNOSIS — Z87891 Personal history of nicotine dependence: Secondary | ICD-10-CM | POA: Diagnosis not present

## 2020-05-18 DIAGNOSIS — N183 Chronic kidney disease, stage 3 unspecified: Secondary | ICD-10-CM | POA: Insufficient documentation

## 2020-05-18 DIAGNOSIS — Z7901 Long term (current) use of anticoagulants: Secondary | ICD-10-CM | POA: Diagnosis not present

## 2020-05-18 DIAGNOSIS — Y9289 Other specified places as the place of occurrence of the external cause: Secondary | ICD-10-CM | POA: Diagnosis not present

## 2020-05-18 DIAGNOSIS — S61216A Laceration without foreign body of right little finger without damage to nail, initial encounter: Secondary | ICD-10-CM | POA: Insufficient documentation

## 2020-05-18 DIAGNOSIS — Y9389 Activity, other specified: Secondary | ICD-10-CM | POA: Diagnosis not present

## 2020-05-18 DIAGNOSIS — Y999 Unspecified external cause status: Secondary | ICD-10-CM | POA: Diagnosis not present

## 2020-05-18 DIAGNOSIS — S60946A Unspecified superficial injury of right little finger, initial encounter: Secondary | ICD-10-CM | POA: Diagnosis present

## 2020-05-18 HISTORY — DX: Unspecified atrial fibrillation: I48.91

## 2020-05-18 NOTE — Discharge Instructions (Addendum)
Please return for any problem.  Keep dressing in place for the next 48 hours.  Follow-up closely with your regular primary care provider.  Confirm that your tetanus is up-to-date with your regular primary care provider tomorrow.  If bleeding returns apply direct pressure to the area for 30 minutes.  If Bleeding does not stop after 30 minutes of pressure please return to the ED for further evaluation.
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
## 2020-05-18 NOTE — ED Triage Notes (Signed)
Pt cut right 5th finger on a piece of glass and since he is on eliquis pt was concerned about excessive bleeding. Pt has controlled bleeding at this time with a bandage.

## 2020-06-03 DIAGNOSIS — N189 Chronic kidney disease, unspecified: Secondary | ICD-10-CM | POA: Diagnosis not present

## 2020-06-03 DIAGNOSIS — Z8679 Personal history of other diseases of the circulatory system: Secondary | ICD-10-CM | POA: Diagnosis not present

## 2020-06-03 DIAGNOSIS — R269 Unspecified abnormalities of gait and mobility: Secondary | ICD-10-CM | POA: Diagnosis not present

## 2020-06-03 DIAGNOSIS — Z7952 Long term (current) use of systemic steroids: Secondary | ICD-10-CM | POA: Diagnosis not present

## 2020-06-03 DIAGNOSIS — M810 Age-related osteoporosis without current pathological fracture: Secondary | ICD-10-CM | POA: Diagnosis not present

## 2020-06-03 DIAGNOSIS — R768 Other specified abnormal immunological findings in serum: Secondary | ICD-10-CM | POA: Diagnosis not present

## 2020-07-01 DIAGNOSIS — Z23 Encounter for immunization: Secondary | ICD-10-CM | POA: Diagnosis not present

## 2020-07-24 DIAGNOSIS — Z23 Encounter for immunization: Secondary | ICD-10-CM | POA: Diagnosis not present

## 2020-09-01 DIAGNOSIS — H532 Diplopia: Secondary | ICD-10-CM | POA: Diagnosis not present

## 2020-09-01 DIAGNOSIS — H5000 Unspecified esotropia: Secondary | ICD-10-CM | POA: Diagnosis not present

## 2020-09-01 DIAGNOSIS — H509 Unspecified strabismus: Secondary | ICD-10-CM | POA: Diagnosis not present

## 2020-09-01 DIAGNOSIS — H5021 Vertical strabismus, right eye: Secondary | ICD-10-CM | POA: Diagnosis not present

## 2020-09-10 DIAGNOSIS — Z20822 Contact with and (suspected) exposure to covid-19: Secondary | ICD-10-CM | POA: Diagnosis not present

## 2020-09-18 DIAGNOSIS — H524 Presbyopia: Secondary | ICD-10-CM | POA: Diagnosis not present

## 2020-09-18 DIAGNOSIS — H52203 Unspecified astigmatism, bilateral: Secondary | ICD-10-CM | POA: Diagnosis not present

## 2020-09-18 DIAGNOSIS — Z8679 Personal history of other diseases of the circulatory system: Secondary | ICD-10-CM | POA: Diagnosis not present

## 2020-12-25 DIAGNOSIS — Z8679 Personal history of other diseases of the circulatory system: Secondary | ICD-10-CM | POA: Diagnosis not present

## 2020-12-25 DIAGNOSIS — M810 Age-related osteoporosis without current pathological fracture: Secondary | ICD-10-CM | POA: Diagnosis not present

## 2020-12-25 DIAGNOSIS — R269 Unspecified abnormalities of gait and mobility: Secondary | ICD-10-CM | POA: Diagnosis not present

## 2020-12-25 DIAGNOSIS — N189 Chronic kidney disease, unspecified: Secondary | ICD-10-CM | POA: Diagnosis not present

## 2020-12-25 DIAGNOSIS — Z7952 Long term (current) use of systemic steroids: Secondary | ICD-10-CM | POA: Diagnosis not present

## 2020-12-25 DIAGNOSIS — R768 Other specified abnormal immunological findings in serum: Secondary | ICD-10-CM | POA: Diagnosis not present

## 2021-01-06 DIAGNOSIS — H1820 Unspecified corneal edema: Secondary | ICD-10-CM | POA: Diagnosis not present

## 2021-01-06 DIAGNOSIS — Z961 Presence of intraocular lens: Secondary | ICD-10-CM | POA: Diagnosis not present

## 2021-03-02 DIAGNOSIS — H5053 Vertical heterophoria: Secondary | ICD-10-CM | POA: Diagnosis not present

## 2021-03-02 DIAGNOSIS — H509 Unspecified strabismus: Secondary | ICD-10-CM | POA: Diagnosis not present

## 2021-03-02 DIAGNOSIS — H5021 Vertical strabismus, right eye: Secondary | ICD-10-CM | POA: Diagnosis not present

## 2021-03-02 DIAGNOSIS — H5051 Esophoria: Secondary | ICD-10-CM | POA: Diagnosis not present

## 2021-03-09 DIAGNOSIS — Z23 Encounter for immunization: Secondary | ICD-10-CM | POA: Diagnosis not present

## 2021-03-30 ENCOUNTER — Emergency Department (HOSPITAL_COMMUNITY): Payer: Medicare Other

## 2021-03-30 ENCOUNTER — Other Ambulatory Visit: Payer: Self-pay

## 2021-03-30 ENCOUNTER — Inpatient Hospital Stay (HOSPITAL_COMMUNITY)
Admission: EM | Admit: 2021-03-30 | Discharge: 2021-04-04 | DRG: 871 | Disposition: A | Discharge status: Home Health | Payer: Medicare Other | Attending: Internal Medicine | Admitting: Internal Medicine

## 2021-03-30 ENCOUNTER — Encounter (HOSPITAL_COMMUNITY): Payer: Self-pay | Admitting: Internal Medicine

## 2021-03-30 DIAGNOSIS — I4819 Other persistent atrial fibrillation: Secondary | ICD-10-CM | POA: Diagnosis present

## 2021-03-30 DIAGNOSIS — Z7901 Long term (current) use of anticoagulants: Secondary | ICD-10-CM

## 2021-03-30 DIAGNOSIS — Z8261 Family history of arthritis: Secondary | ICD-10-CM | POA: Diagnosis not present

## 2021-03-30 DIAGNOSIS — I1310 Hypertensive heart and chronic kidney disease without heart failure, with stage 1 through stage 4 chronic kidney disease, or unspecified chronic kidney disease: Secondary | ICD-10-CM | POA: Diagnosis present

## 2021-03-30 DIAGNOSIS — Z87440 Personal history of urinary (tract) infections: Secondary | ICD-10-CM

## 2021-03-30 DIAGNOSIS — I480 Paroxysmal atrial fibrillation: Secondary | ICD-10-CM | POA: Diagnosis present

## 2021-03-30 DIAGNOSIS — Z0389 Encounter for observation for other suspected diseases and conditions ruled out: Secondary | ICD-10-CM | POA: Diagnosis not present

## 2021-03-30 DIAGNOSIS — N1832 Chronic kidney disease, stage 3b: Secondary | ICD-10-CM | POA: Diagnosis present

## 2021-03-30 DIAGNOSIS — R6520 Severe sepsis without septic shock: Secondary | ICD-10-CM | POA: Diagnosis present

## 2021-03-30 DIAGNOSIS — N390 Urinary tract infection, site not specified: Secondary | ICD-10-CM | POA: Diagnosis present

## 2021-03-30 DIAGNOSIS — R410 Disorientation, unspecified: Secondary | ICD-10-CM | POA: Diagnosis not present

## 2021-03-30 DIAGNOSIS — Z8249 Family history of ischemic heart disease and other diseases of the circulatory system: Secondary | ICD-10-CM | POA: Diagnosis not present

## 2021-03-30 DIAGNOSIS — A419 Sepsis, unspecified organism: Secondary | ICD-10-CM | POA: Diagnosis present

## 2021-03-30 DIAGNOSIS — M316 Other giant cell arteritis: Secondary | ICD-10-CM | POA: Diagnosis present

## 2021-03-30 DIAGNOSIS — R0609 Other forms of dyspnea: Secondary | ICD-10-CM | POA: Diagnosis present

## 2021-03-30 DIAGNOSIS — I10 Essential (primary) hypertension: Secondary | ICD-10-CM | POA: Diagnosis present

## 2021-03-30 DIAGNOSIS — U071 COVID-19: Secondary | ICD-10-CM | POA: Diagnosis present

## 2021-03-30 DIAGNOSIS — G9341 Metabolic encephalopathy: Secondary | ICD-10-CM | POA: Diagnosis present

## 2021-03-30 DIAGNOSIS — Z7952 Long term (current) use of systemic steroids: Secondary | ICD-10-CM | POA: Diagnosis not present

## 2021-03-30 DIAGNOSIS — Z833 Family history of diabetes mellitus: Secondary | ICD-10-CM

## 2021-03-30 DIAGNOSIS — N400 Enlarged prostate without lower urinary tract symptoms: Secondary | ICD-10-CM | POA: Diagnosis present

## 2021-03-30 DIAGNOSIS — R059 Cough, unspecified: Secondary | ICD-10-CM | POA: Diagnosis not present

## 2021-03-30 DIAGNOSIS — J811 Chronic pulmonary edema: Secondary | ICD-10-CM | POA: Diagnosis not present

## 2021-03-30 DIAGNOSIS — A4159 Other Gram-negative sepsis: Secondary | ICD-10-CM | POA: Diagnosis present

## 2021-03-30 DIAGNOSIS — E785 Hyperlipidemia, unspecified: Secondary | ICD-10-CM | POA: Diagnosis present

## 2021-03-30 DIAGNOSIS — R509 Fever, unspecified: Secondary | ICD-10-CM | POA: Diagnosis not present

## 2021-03-30 DIAGNOSIS — R0602 Shortness of breath: Secondary | ICD-10-CM

## 2021-03-30 DIAGNOSIS — R0689 Other abnormalities of breathing: Secondary | ICD-10-CM | POA: Diagnosis not present

## 2021-03-30 DIAGNOSIS — Z8673 Personal history of transient ischemic attack (TIA), and cerebral infarction without residual deficits: Secondary | ICD-10-CM | POA: Diagnosis not present

## 2021-03-30 DIAGNOSIS — I517 Cardiomegaly: Secondary | ICD-10-CM | POA: Diagnosis not present

## 2021-03-30 DIAGNOSIS — R000 Tachycardia, unspecified: Secondary | ICD-10-CM | POA: Diagnosis not present

## 2021-03-30 DIAGNOSIS — R42 Dizziness and giddiness: Secondary | ICD-10-CM | POA: Diagnosis not present

## 2021-03-30 LAB — CBC WITH DIFFERENTIAL/PLATELET
Abs Immature Granulocytes: 0.1 10*3/uL — ABNORMAL HIGH (ref 0.00–0.07)
Basophils Absolute: 0 10*3/uL (ref 0.0–0.1)
Basophils Relative: 0 %
Eosinophils Absolute: 0 10*3/uL (ref 0.0–0.5)
Eosinophils Relative: 0 %
HCT: 43.1 % (ref 39.0–52.0)
Hemoglobin: 13.7 g/dL (ref 13.0–17.0)
Immature Granulocytes: 1 %
Lymphocytes Relative: 5 %
Lymphs Abs: 0.7 10*3/uL (ref 0.7–4.0)
MCH: 30.6 pg (ref 26.0–34.0)
MCHC: 31.8 g/dL (ref 30.0–36.0)
MCV: 96.2 fL (ref 80.0–100.0)
Monocytes Absolute: 1.1 10*3/uL — ABNORMAL HIGH (ref 0.1–1.0)
Monocytes Relative: 7 %
Neutro Abs: 13 10*3/uL — ABNORMAL HIGH (ref 1.7–7.7)
Neutrophils Relative %: 87 %
Platelets: 148 10*3/uL — ABNORMAL LOW (ref 150–400)
RBC: 4.48 MIL/uL (ref 4.22–5.81)
RDW: 13.4 % (ref 11.5–15.5)
WBC: 14.9 10*3/uL — ABNORMAL HIGH (ref 4.0–10.5)
nRBC: 0 % (ref 0.0–0.2)

## 2021-03-30 LAB — URINALYSIS, ROUTINE W REFLEX MICROSCOPIC
Bilirubin Urine: NEGATIVE
Glucose, UA: NEGATIVE mg/dL
Ketones, ur: NEGATIVE mg/dL
Nitrite: NEGATIVE
Protein, ur: NEGATIVE mg/dL
Specific Gravity, Urine: 1.017 (ref 1.005–1.030)
pH: 5 (ref 5.0–8.0)

## 2021-03-30 LAB — COMPREHENSIVE METABOLIC PANEL
ALT: 22 U/L (ref 0–44)
AST: 26 U/L (ref 15–41)
Albumin: 3.6 g/dL (ref 3.5–5.0)
Alkaline Phosphatase: 48 U/L (ref 38–126)
Anion gap: 11 (ref 5–15)
BUN: 25 mg/dL — ABNORMAL HIGH (ref 8–23)
CO2: 25 mmol/L (ref 22–32)
Calcium: 9.7 mg/dL (ref 8.9–10.3)
Chloride: 100 mmol/L (ref 98–111)
Creatinine, Ser: 1.75 mg/dL — ABNORMAL HIGH (ref 0.61–1.24)
GFR, Estimated: 37 mL/min — ABNORMAL LOW (ref >60)
Glucose, Bld: 164 mg/dL — ABNORMAL HIGH (ref 70–99)
Potassium: 4 mmol/L (ref 3.5–5.1)
Sodium: 136 mmol/L (ref 135–145)
Total Bilirubin: 1 mg/dL (ref 0.3–1.2)
Total Protein: 6.3 g/dL — ABNORMAL LOW (ref 6.5–8.1)

## 2021-03-30 LAB — RESP PANEL BY RT-PCR (FLU A&B, COVID) ARPGX2
Influenza A by PCR: NEGATIVE
Influenza B by PCR: NEGATIVE
SARS Coronavirus 2 by RT PCR: NEGATIVE

## 2021-03-30 LAB — LACTIC ACID, PLASMA
Lactic Acid, Venous: 2.5 mmol/L (ref 0.5–1.9)
Lactic Acid, Venous: 1.5 mmol/L (ref 0.5–1.9)

## 2021-03-30 LAB — APTT: aPTT: 32 seconds (ref 24–36)

## 2021-03-30 LAB — PROTIME-INR
INR: 1.6 — ABNORMAL HIGH (ref 0.8–1.2)
Prothrombin Time: 19.2 seconds — ABNORMAL HIGH (ref 11.4–15.2)

## 2021-03-30 LAB — PROCALCITONIN: Procalcitonin: <0.1 ng/mL

## 2021-03-30 MED ORDER — LORATADINE 10 MG PO TABS
10.0000 mg | ORAL_TABLET | Freq: Every day | ORAL | Status: DC
  Administered 2021-03-31 – 2021-04-04 (×5): 10 mg via ORAL
  Filled 2021-03-30 (×5): qty 1

## 2021-03-30 MED ORDER — ACETAMINOPHEN 650 MG RE SUPP: 650.0000 mg | Freq: Four times a day (QID) | RECTAL | Status: DC | PRN

## 2021-03-30 MED ORDER — APIXABAN 2.5 MG PO TABS
2.5000 mg | ORAL_TABLET | Freq: Two times a day (BID) | ORAL | Status: DC
  Administered 2021-03-30 – 2021-04-04 (×10): 2.5 mg via ORAL
  Filled 2021-03-30 (×10): qty 1

## 2021-03-30 MED ORDER — APIXABAN 5 MG PO TABS: 5.0000 mg | ORAL_TABLET | Freq: Two times a day (BID) | ORAL | Status: DC

## 2021-03-30 MED ORDER — ACETAMINOPHEN 325 MG PO TABS: 650.0000 mg | ORAL_TABLET | Freq: Four times a day (QID) | ORAL | Status: DC | PRN

## 2021-03-30 MED ORDER — SODIUM CHLORIDE 0.9% FLUSH
3.0000 mL | Freq: Two times a day (BID) | INTRAVENOUS | Status: DC
  Administered 2021-03-30 – 2021-04-03 (×9): 3 mL via INTRAVENOUS

## 2021-03-30 MED ORDER — SODIUM CHLORIDE 0.9 % IV SOLN
2.0000 g | INTRAVENOUS | Status: DC
  Administered 2021-03-30: 2 g via INTRAVENOUS
  Filled 2021-03-30: qty 2

## 2021-03-30 MED ORDER — HYDROCORTISONE NA SUCCINATE PF 100 MG IJ SOLR
50.0000 mg | Freq: Four times a day (QID) | INTRAMUSCULAR | Status: DC
  Administered 2021-03-30 – 2021-03-31 (×4): 50 mg via INTRAVENOUS
  Filled 2021-03-30 (×4): qty 2

## 2021-03-30 MED ORDER — ONDANSETRON HCL 4 MG PO TABS: 4.0000 mg | ORAL_TABLET | Freq: Four times a day (QID) | ORAL | Status: DC | PRN

## 2021-03-30 MED ORDER — LACTATED RINGERS IV SOLN: INTRAVENOUS | Status: DC

## 2021-03-30 MED ORDER — METRONIDAZOLE 500 MG/100ML IV SOLN
500.0000 mg | Freq: Three times a day (TID) | INTRAVENOUS | Status: DC
  Administered 2021-03-31 (×2): 500 mg via INTRAVENOUS
  Filled 2021-03-30 (×2): qty 100

## 2021-03-30 MED ORDER — VANCOMYCIN HCL 750 MG/150ML IV SOLN
750.0000 mg | INTRAVENOUS | Status: DC
  Filled 2021-03-30: qty 150

## 2021-03-30 MED ORDER — LACTATED RINGERS IV BOLUS (SEPSIS): 1500.0000 mL | Freq: Once | INTRAVENOUS | Status: DC

## 2021-03-30 MED ORDER — SODIUM CHLORIDE 0.9 % IV SOLN
1.0000 g | Freq: Once | INTRAVENOUS | Status: AC
  Administered 2021-03-30: 1 g via INTRAVENOUS
  Filled 2021-03-30: qty 10

## 2021-03-30 MED ORDER — ACETAMINOPHEN 500 MG PO TABS
1000.0000 mg | ORAL_TABLET | Freq: Once | ORAL | Status: AC
  Administered 2021-03-30: 1000 mg via ORAL
  Filled 2021-03-30: qty 2

## 2021-03-30 MED ORDER — METRONIDAZOLE 500 MG/100ML IV SOLN
500.0000 mg | Freq: Once | INTRAVENOUS | Status: AC
  Administered 2021-03-30: 500 mg via INTRAVENOUS
  Filled 2021-03-30: qty 100

## 2021-03-30 MED ORDER — LACTATED RINGERS IV BOLUS (SEPSIS): 250.0000 mL | Freq: Once | INTRAVENOUS | Status: DC

## 2021-03-30 MED ORDER — LACTATED RINGERS IV BOLUS (SEPSIS)
500.0000 mL | Freq: Once | INTRAVENOUS | Status: AC
  Administered 2021-03-30: 500 mL via INTRAVENOUS

## 2021-03-30 MED ORDER — ONDANSETRON HCL 4 MG/2ML IJ SOLN: 4.0000 mg | Freq: Four times a day (QID) | INTRAMUSCULAR | Status: DC | PRN

## 2021-03-30 MED ORDER — VANCOMYCIN HCL 1500 MG/300ML IV SOLN
1500.0000 mg | Freq: Once | INTRAVENOUS | Status: AC
  Administered 2021-03-30: 1500 mg via INTRAVENOUS
  Filled 2021-03-30 (×2): qty 300

## 2021-03-30 NOTE — H&P (Addendum)
History and Physical    Casey Lee:[id number] DOB: 1928/02/21 DOA: 03/30/2021  PCP: [name], MD Consultants:  Kathlene November - rheumatology Patient coming from:  Home - lives with wife; NOK: Wife, Casey Lee, [phone]  Chief Complaint: Weakness  HPI: Casey Lee is a 87 y.o. male with medical history significant of afib; HLD; and TIA presenting with weakness.  He reports that he drives his wife to HD 3 times a week and they were preparing to go today when he became acutely fuzzy headed.  He remains mildly confused, able to answer questions but with mild cognitive slowing that appears to be different from his baseline.  He reports mild periodic cough that is no different from baseline.  No known COVID exposures.  Mild urinary frequency without other urinary symptoms.  No GI symptoms.   ED Course: Likely sepsis - FUO, ?UTI.  High functioning at baseline and "couldn't get it together today."  +fatigue, nonfocal.  +cough x 3-4 days, T101.6, mild delirium.  COVID pending.  WBC 14.  Review of Systems: As per HPI; otherwise review of systems reviewed and negative.    COVID Vaccine Status:  Complete plus 2 boosters  Past Medical History:  Diagnosis Date   Atrial fibrillation (HCC)    Hearing loss    High cholesterol    Temporal arteritis (HCC)    TIA (transient ischemic attack)    UTI (lower urinary tract infection)    Vertigo     Past Surgical History:  Procedure Laterality Date   ARTERY BIOPSY Right 04/30/2018   Procedure: BIOPSY TEMPORAL ARTERY;  Surgeon: Izora Gala, MD;  Location: [facility];  Service: ENT;  Laterality: Right;   CATARACT EXTRACTION Right 04/28/2020   I & D EXTREMITY Right 05/12/2018   Procedure: IRRIGATION AND DEBRIDEMENT RIGHT KNEE BURSA;  Surgeon: Marchia Bond, MD;  Location: [facility];  Service: Orthopedics;  Laterality: Right;   none      Social History   Socioeconomic History   Marital status: Married    Spouse name: Not  on file   Number of children: Not on file   Years of education: Not on file   Highest education level: Not on file  Occupational History   Not on file  Tobacco Use   Smoking status: Former    Pack years: 0.00    Types: Cigarettes    Quit date: 11/07/1952    Years since quitting: 68.4   Smokeless tobacco: Never  Vaping Use   Vaping Use: Never used  Substance and Sexual Activity   Alcohol use: No    Alcohol/week: 0.0 standard drinks   Drug use: No   Sexual activity: Not on file  Other Topics Concern   Not on file  Social History Narrative   Lives with wife in split level home.  Has 2 sons.     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     Currently works as a Engineer, site.   Social Determinants of Health   Financial Resource Strain: Not on file  Food Insecurity: Not on file  Transportation Needs: Not on file  Physical Activity: Not on file  Stress: Not on file  Social Connections: Not on file  Intimate Partner Violence: Not on file    Allergies  Allergen Reactions   Fosamax [Alendronate] Other (See Comments)    SEVERE JAW(BONE) PAIN    Family History  Problem Relation Age of Onset   Rheum arthritis Father    Heart failure Father  Deceased, 11   Rheum arthritis Sister    Diabetes Mellitus II Sister    Healthy Son        x2    Prior to Admission medications   Medication Sig Start Date End Date Taking? Authorizing Provider  apixaban (ELIQUIS) 5 MG TABS tablet Take 1 tablet (5 mg total) by mouth 2 (two) times daily. 05/16/18  Yes Shelly Coss, MD  Calcium-Phosphorus-Vitamin D (CALCIUM/D3 ADULT GUMMIES PO) Take 3 tablets by mouth See admin instructions. CHEW 3 gummies by mouth daily   Yes [provider]  Coenzyme Q10 (COQ10 PO) Take 1 capsule by mouth daily with lunch.   Yes [provider]  loratadine (CLARITIN) 10 MG tablet Take 10 mg by mouth daily.   Yes [provider]  metoprolol succinate (TOPROL-XL) 25 MG 24 hr tablet Take 1  tablet (25 mg total) by mouth daily. 05/17/18  Yes Shelly Coss, MD  predniSONE (DELTASONE) 5 MG tablet Take 5 mg by mouth daily. 11/03/17  Yes [provider]    Physical Exam: Vitals:   03/30/21 1530 03/30/21 1600 03/30/21 1630 03/30/21 1700  BP: 111/77 95/69 97/64  118/88  Pulse: 92 90 99 (!) 107  Resp: (!) 26 (!) 28 (!) 23 (!) 25  Temp:      TempSrc:      SpO2: 93% 95% 94% 94%  Weight:    66 kg  Height:    5\' 9"  (1.753 m)     General:  Appears calm and comfortable and is in NAD Eyes:  PERRL, EOMI, normal lids, iris ENT:  hard of hearing, grossly normal lips & tongue, mmm Neck:  no LAD, masses or thyromegaly Cardiovascular:  RRR, no m/r/g. No LE edema.  Respiratory:   ?LUL rhonchi, otherwise CTA.  Mildly increased respiratory effort. Abdomen:  soft, NT, ND, no suprapubic TTP Back:   normal alignment, no CVAT Skin:  no rash or induration seen on limited exam Musculoskeletal:  grossly normal tone BUE/BLE, good ROM, no bony abnormality Psychiatric:  grossly normal mood and affect, speech fluent and appropriate, AOx3, minimally confused Neurologic:  CN 2-12 grossly intact, moves all extremities in coordinated fashion    Radiological Exams on Admission: Independently reviewed - see discussion in A/P where applicable  DG Chest Port 1 View  Result Date: 03/30/2021 CLINICAL DATA:  Question sepsis EXAM: PORTABLE CHEST 1 VIEW COMPARISON:  05/11/2018 FINDINGS: The heart size and mediastinal contours are within normal limits. Both lungs are clear. The visualized skeletal structures are unremarkable. IMPRESSION: No active disease. Electronically Signed   By: Franchot Gallo M.D.   On: 03/30/2021 14:58    EKG: Independently reviewed.  Afib with rate 114; nonspecific ST changes with no evidence of acute ischemia   Labs on Admission: I have personally reviewed the available labs and imaging studies at the time of the admission.  Pertinent labs:   Glucose 164 BUN  25/Creatinine 1.75/GFR 37 - likely chronic Lactate 2.5 WBC 14.9 INR 1.6 UA: Small Hgb, small LE, many bacteria Blood cultures pending   Assessment/Plan Principal Problem:   Sepsis due to undetermined organism [facility]) Active Problems:   Essential hypertension   Hyperlipidemia LDL goal <70   PAF (paroxysmal atrial fibrillation) (HCC)   Sepsis -Sepsis indicates life-threatening organ dysfunction with mortality >10%, caused by dysregulation to host response.   -SIRS criteria in this patient includes: Leukocytosis, fever, tachycardia, tachypnea  -Patient has evidence of acute organ failure with elevated lactate >2; mild encephalopathy that is not  easily explained by another condition. -While awaiting blood cultures, this appears to be a preseptic condition. -Sepsis protocol initiated -Patient did not have initial lactate >4 or SBP <90/MAP <65 and so has not received the 30 cc/kg IVF bolus. -Suspected source is COVID vs. CAP (too early to see on CXR) vs. UTI -Blood and urine cultures pending -Will admit due to: advanced age, concern for hemodynamic instability -Treat with IV Flagyl/Cefepime/Vanc for undifferentiated sepsis -Will trend lactate to ensure improvement -Will order procalcitonin level.  -Hold Prednisone (on this for presumed rheumatologic issue chronically) and treat with stress dosed steroids  Afib -Hold Toprol XL (rate control) due to borderline hypotension -Continue Eliquis  HLD -He does not appear to be taking medications for this issue at this time       Note: This patient has been tested and is pending for the novel coronavirus COVID-19. He has been fully vaccinated against COVID-19.    DVT prophylaxis:  Eliquis Code Status:  Full - confirmed with patient Family Communication: None present; I spoke with his wife by telephone at the time of admission Disposition Plan:  The patient is from: home  Anticipated d/c is to: home without [facility] services   Anticipated  d/c date will depend on clinical response to treatment, likely 2-3 days  Patient is currently: acutely ill Consults called: None  Admission status: Admit - It is my clinical opinion that admission to [place] is reasonable and necessary because of the expectation that this patient will require hospital care that crosses at least 2 midnights to treat this condition based on the medical complexity of the problems presented.  Given the aforementioned information, the predictability of an adverse outcome is felt to be significant.     Karmen Bongo MD [facility]   How to contact the [facility] Attending or Consulting provider [place] or covering provider during after hours [place], for this patient?  Check the care team in [facility] and look for a) attending/consulting [facility] provider listed and b) the [facility] team listed Log into [email or web address] and use [facility]'s universal password to access. If you do not have the password, please contact the hospital operator. Locate the [facility] provider you are looking for under [facility] and page to a number that you can be directly reached. If you still have difficulty reaching the provider, please page the [facility] (Director on Call) for the Hospitalists listed on amion for assistance.   03/30/2021, 5:42 PM

## 2021-03-30 NOTE — ED Triage Notes (Signed)
Pt BIB [facility] after episode of weakness this am, pt tried to sit on edge of bed and missed. Pt mildly febrile and reports weakness and feeling tired this morning when leaving for a drs appt. Pt A&Ox4 for EMS and on assessment. Pt was incontinent of urine upon EMS assessment. Pt has hx of afib.

## 2021-03-30 NOTE — ED Notes (Signed)
Family updated as to patient's status. Spoke with Kincaid, pt's son, and provided update on pt status and verified baseline.

## 2021-03-30 NOTE — ED Notes (Signed)
Neshawn Aird (Son) of Axl in 9 called asking for an update at [phone]

## 2021-03-30 NOTE — Sepsis Progress Note (Signed)
eLink is monitoring this Code Sepsis. °

## 2021-03-30 NOTE — Progress Notes (Signed)
Pharmacy Antibiotic Note  Casey Lee is a 87 y.o. male admitted on 03/30/2021 with sepsis; unknown source.  Pharmacy has been consulted for vancomycin and cefepime dosing.  Plan: Cefepime 2g q24h Vancomycin 1500mg  once then Vancomycin 750mg  IV every 24 hours.  Goal trough 15-20 mcg/mL. Pharmacy to adjust dosing/frequency as needed based on renal function F/U MRSA PCR - cough noted x 3-4 days     Temp (24hrs), Avg:100.7 F (38.2 C), Min:99.8 F (37.7 C), Max:101.6 F (38.7 C)  Recent Labs  Lab 03/30/21 1415 03/30/21 1419  WBC 14.9*  --   CREATININE 1.75*  --   LATICACIDVEN  --  2.5*    CrCl cannot be calculated (Unknown ideal weight.).    Allergies  Allergen Reactions   Fosamax [Alendronate] Other (See Comments)    SEVERE JAW(BONE) PAIN    Antimicrobials this admission: Ceftriaxone x 1  Vancomycin 6/28 >> Metronidazole 6/28 >> Cefepime 6/28>>  Dose adjustments this admission: N/A  Microbiology results: Pending  Thank you for allowing pharmacy to be a part of this patient's care.  Heloise Purpura 03/30/2021 4:11 PM

## 2021-03-30 NOTE — ED Provider Notes (Signed)
[facility] EMERGENCY DEPARTMENT Provider Note   CSN: [id number] Arrival date & time:        History Chief Complaint  Patient presents with   Weakness    Casey Lee is a 87 y.o. male.  Patient is a 87 year old male with history of atrial fibrillation on Eliquis, prior TIA, recurrent UTIs, CKD, hypertension, hyperlipidemia who is presenting today with generalized weakness.  Patient reports today he has just not felt himself.  He has noted a little bit of a cough over the last 3 to 4 days and some mild shortness of breath this morning but reports that he got out of bed this morning he ate breakfast but then his wife reported that the patient was going to take her to dialysis and he walked into the carport and just blankly stared at her and walked back into the house.  He reports he just could not get it together to take her to dialysis.  She said that he went in to sit on the side of the bed and he sat too far on the edge and slid down to the floor.  He never lost consciousness or hit his head.  He had no injury from the fall.  Wife reports he just was not acting himself.  When fire arrived patient was awake and alert.  They did note a temperature of 100.9 by a temporal thermometer and a 20 point drop in orthostatics.  Patient denies any chest pain, abdominal pain, nausea or vomiting.  He has no headache.  No known sick contacts.  He has had his COVID-vaccine and boosters x2.  The history is provided by the patient, the EMS personnel and the spouse.  Weakness     Past Medical History:  Diagnosis Date   Atrial fibrillation (HCC)    Hearing loss    High cholesterol    Temporal arteritis (HCC)    TIA (transient ischemic attack)    UTI (lower urinary tract infection)    Vertigo     Patient Active Problem List   Diagnosis Date Noted   Right knee pain 05/12/2018   Acute renal failure superimposed on stage 3 chronic kidney disease ([place]) 05/11/2018   Septic  prepatellar bursitis of right knee 05/11/2018   Temporal arteritis (HCC)    HLD (hyperlipidemia) 03/19/2016   SDH (subdural hematoma) (HCC) 01/28/2016   BPPV (benign paroxysmal positional vertigo) 01/28/2016   Cerebral infarction ([place]) - incidental, punctate R periventricular infarct 01/28/2016   Essential hypertension 01/28/2016   Hyperlipidemia LDL goal <70 01/28/2016   Acute encephalopathy    Traumatic hemorrhage of cerebrum with loss of consciousness ([place]) - tiny L caudate 01/24/2016    Past Surgical History:  Procedure Laterality Date   ARTERY BIOPSY Right 04/30/2018   Procedure: BIOPSY TEMPORAL ARTERY;  Surgeon: Izora Gala, MD;  Location: [facility];  Service: ENT;  Laterality: Right;   CATARACT EXTRACTION Right 04/28/2020   I & D EXTREMITY Right 05/12/2018   Procedure: IRRIGATION AND DEBRIDEMENT RIGHT KNEE BURSA;  Surgeon: Marchia Bond, MD;  Location: [facility];  Service: Orthopedics;  Laterality: Right;   none         Family History  Problem Relation Age of Onset   Rheum arthritis Father    Heart failure Father        Deceased, 46   Rheum arthritis Sister    Diabetes Mellitus II Sister    Healthy Son  x2    Social History   Tobacco Use   Smoking status: Former    Pack years: 0.00    Types: Cigarettes    Quit date: 11/07/1952    Years since quitting: 68.4   Smokeless tobacco: Never  Vaping Use   Vaping Use: Never used  Substance Use Topics   Alcohol use: No    Alcohol/week: 0.0 standard drinks   Drug use: No    Home Medications Prior to Admission medications   Medication Sig Start Date End Date Taking? Authorizing Provider  alendronate (FOSAMAX) 70 MG tablet Take 70 mg by mouth every Monday.  01/12/18   [provider]  apixaban (ELIQUIS) 5 MG TABS tablet Take 1 tablet (5 mg total) by mouth 2 (two) times daily. 05/16/18   Shelly Coss, MD  Coenzyme Q10 (COQ10 PO) Take 1 tablet by mouth daily.    [provider]   diphenhydrAMINE (BENADRYL) 25 mg capsule Take 25 mg by mouth daily as needed for itching.     [provider]  loratadine (CLARITIN) 10 MG tablet Take 10 mg by mouth daily.     [provider]  metoprolol succinate (TOPROL-XL) 25 MG 24 hr tablet Take 1 tablet (25 mg total) by mouth daily. 05/17/18   Shelly Coss, MD  predniSONE (DELTASONE) 5 MG tablet Take 5 mg by mouth daily. 11/03/17   [provider]  rosuvastatin (CRESTOR) 10 MG tablet Take 10 mg by mouth daily.  02/16/16   [provider]  TURMERIC PO Take 1 capsule by mouth daily.     [provider]    Allergies    Patient has no known allergies.  Review of Systems   Review of Systems  Neurological:  Positive for weakness.  All other systems reviewed and are negative.  Physical Exam Updated Vital Signs There were no vitals taken for this visit.  Physical Exam Vitals and nursing note reviewed.  Constitutional:      General: He is not in acute distress.    Appearance: Normal appearance. He is well-developed.  HENT:     Head: Normocephalic and atraumatic.     Mouth/Throat:     Mouth: Mucous membranes are moist.  Eyes:     Conjunctiva/sclera: Conjunctivae normal.     Pupils: Pupils are equal, round, and reactive to light.  Cardiovascular:     Rate and Rhythm: Normal rate and regular rhythm.     Heart sounds: No murmur heard. Pulmonary:     Effort: Pulmonary effort is normal. No respiratory distress.     Breath sounds: Examination of the right-lower field reveals rales. Examination of the left-lower field reveals rales. Rales present. No decreased breath sounds or wheezing.  Abdominal:     General: There is no distension.     Palpations: Abdomen is soft.     Tenderness: There is no abdominal tenderness. There is no guarding or rebound.  Musculoskeletal:        General: No tenderness. Normal range of motion.     Cervical back: Normal range of motion and neck supple.     Right  lower leg: No edema.     Left lower leg: No edema.  Skin:    General: Skin is warm and dry.     Findings: No erythema or rash.  Neurological:     Mental Status: He is alert and oriented to person, place, and time. Mental status is at baseline.     Sensory: No sensory deficit.  Motor: No weakness.  Psychiatric:        Mood and Affect: Mood normal.        Behavior: Behavior normal.    ED Results / Procedures / Treatments   Labs (all labs ordered are listed, but only abnormal results are displayed) Labs Reviewed  RESP PANEL BY RT-PCR (FLU A&B, COVID) ARPGX2  CULTURE, BLOOD (ROUTINE X 2)  CULTURE, BLOOD (ROUTINE X 2)  URINE CULTURE  LACTIC ACID, PLASMA  LACTIC ACID, PLASMA  COMPREHENSIVE METABOLIC PANEL  CBC WITH DIFFERENTIAL/PLATELET  PROTIME-INR  APTT  URINALYSIS, ROUTINE W REFLEX MICROSCOPIC    EKG None  Radiology No results found.  Procedures Procedures   Medications Ordered in ED Medications  lactated ringers infusion (has no administration in time range)  cefTRIAXone (ROCEPHIN) 1 g in sodium chloride 0.9 % 100 mL IVPB (has no administration in time range)  lactated ringers bolus 500 mL (has no administration in time range)    ED Course  I have reviewed the triage vital signs and the nursing notes.  Pertinent labs & imaging results that were available during my care of the patient were reviewed by me and considered in my medical decision making (see chart for details).    MDM Rules/Calculators/A&P                          Elderly male with multiple medical problems presenting today with symptoms concerning for sepsis.  Patient was febrile, generally weak and has had a cough.  Also he was noted to be incontinent of urine at his home which wife reported was fairly regularly.  Patient is displaying no localized symptoms concerning for stroke at this time.  He is awake and alert.  Patient does have rales bilaterally in the lower lobes.  He has no abdominal pain  or evidence of fluid overload at this time.  4:39 PM Patient has a leukocytosis of 14,000 today, UA with small leukocytes and 11-20 white cells with many bacteria.  Unclear if this is truly his source.  He was covered with Rocephin but because there were no other acute findings his coverage was broa CMP with creatinine of 1.75 which seems to be within his baseline.  Dened to fever of unknown origin.  Lactic acid is elevated at 2.5.  Chest x-ray within normal limits.  COVID is still pending.  Given patient's mild delirium, fever of unknown origin will admit for further care.  MDM   Amount and/or Complexity of Data Reviewed Clinical lab tests: ordered and reviewed Tests in the radiology section of CPT: ordered and reviewed Tests in the medicine section of CPT: ordered and reviewed Independent visualization of images, tracings, or specimens: yes    Final Clinical Impression(s) / ED Diagnoses Final diagnoses:  Sepsis without acute organ dysfunction, due to unspecified organism [facility])    Rx / DC Orders ED Discharge Orders     None        Blanchie Dessert, MD 03/30/21 1641

## 2021-03-30 NOTE — ED Notes (Signed)
Sepsis admit   Carmin Muskrat, MD 03/30/21 [phone]

## 2021-03-31 ENCOUNTER — Inpatient Hospital Stay (HOSPITAL_COMMUNITY): Payer: Medicare Other

## 2021-03-31 DIAGNOSIS — I480 Paroxysmal atrial fibrillation: Secondary | ICD-10-CM

## 2021-03-31 LAB — BASIC METABOLIC PANEL
Anion gap: 9 (ref 5–15)
BUN: 29 mg/dL — ABNORMAL HIGH (ref 8–23)
CO2: 20 mmol/L — ABNORMAL LOW (ref 22–32)
Calcium: 9.3 mg/dL (ref 8.9–10.3)
Chloride: 107 mmol/L (ref 98–111)
Creatinine, Ser: 1.6 mg/dL — ABNORMAL HIGH (ref 0.61–1.24)
GFR, Estimated: 41 mL/min — ABNORMAL LOW (ref >60)
Glucose, Bld: 169 mg/dL — ABNORMAL HIGH (ref 70–99)
Potassium: 4.4 mmol/L (ref 3.5–5.1)
Sodium: 136 mmol/L (ref 135–145)

## 2021-03-31 LAB — CBC
HCT: 39.9 % (ref 39.0–52.0)
Hemoglobin: 13.1 g/dL (ref 13.0–17.0)
MCH: 31.1 pg (ref 26.0–34.0)
MCHC: 32.8 g/dL (ref 30.0–36.0)
MCV: 94.8 fL (ref 80.0–100.0)
Platelets: 135 10*3/uL — ABNORMAL LOW (ref 150–400)
RBC: 4.21 MIL/uL — ABNORMAL LOW (ref 4.22–5.81)
RDW: 13.5 % (ref 11.5–15.5)
WBC: 17.2 10*3/uL — ABNORMAL HIGH (ref 4.0–10.5)
nRBC: 0 % (ref 0.0–0.2)

## 2021-03-31 MED ORDER — SODIUM CHLORIDE 0.9 % IV SOLN
1.0000 g | INTRAVENOUS | Status: DC
  Administered 2021-03-31 – 2021-04-02 (×3): 1 g via INTRAVENOUS
  Filled 2021-03-31 (×4): qty 10

## 2021-03-31 MED ORDER — HYDROCORTISONE NA SUCCINATE PF 100 MG IJ SOLR
25.0000 mg | Freq: Three times a day (TID) | INTRAMUSCULAR | Status: DC
  Administered 2021-03-31 – 2021-04-01 (×2): 25 mg via INTRAVENOUS
  Filled 2021-03-31 (×2): qty 2

## 2021-03-31 MED ORDER — FUROSEMIDE 10 MG/ML IJ SOLN
20.0000 mg | Freq: Once | INTRAMUSCULAR | Status: AC
  Administered 2021-03-31: 20 mg via INTRAVENOUS
  Filled 2021-03-31: qty 2

## 2021-03-31 MED ORDER — BACITRACIN ZINC 500 UNIT/GM EX OINT
TOPICAL_OINTMENT | Freq: Two times a day (BID) | CUTANEOUS | Status: DC
  Filled 2021-03-31: qty 28.4

## 2021-03-31 MED ORDER — MELATONIN 5 MG PO TABS: 5.0000 mg | ORAL_TABLET | Freq: Once | ORAL | Status: DC

## 2021-03-31 MED ORDER — TAMSULOSIN HCL 0.4 MG PO CAPS
0.4000 mg | ORAL_CAPSULE | Freq: Every day | ORAL | Status: DC
  Administered 2021-03-31 – 2021-04-03 (×4): 0.4 mg via ORAL
  Filled 2021-03-31 (×4): qty 1

## 2021-03-31 NOTE — Progress Notes (Signed)
NEW ADMISSION NOTE New Admission Note:   Arrival Method: [facility] Mental Orientation: A&O x4 Telemetry: 5M13 Assessment: Completed Skin: Completed with Chorito Bokiagon,RN Intact, Pt does not have any pressure ulcers. Skin tear was noted on left elbow. Dressing placed on wound; consult placed. IV: 20G LFA & 20G RAC Pain: 0/10 Tubes: none present Safety Measures: Safety Fall Prevention Plan has been given, discussed and signed Admission: Completed [facility] Orientation: Patient has been orientated to the room, unit and staff. Family: none present   Orders have been reviewed and implemented. Will continue to monitor the patient. Call light has been placed within reach and bed alarm has been activated.

## 2021-03-31 NOTE — Evaluation (Signed)
Physical Therapy Evaluation Patient Details Name: Casey Lee MRN: [id number] DOB: 05-Apr-1928 Today's Date: 03/31/2021   History of Present Illness  87yo admitted 6/28 with c/o acute confusion and AMS different from his baseline. Found to be in sepsis. PMH Afib, HOH, HLD, temporal arteritis, TIA, UTI, vertigo  Clinical Impression   Patient received sitting on the couch in the room, pleasantly confused but cooperative with therapy. Able to gait train in the hallway on a light min guard basis with mild gait deviation but suspect he may be close to baseline. Did struggle quite a bit with stair training and needed heavy MinA to maintain balance and safety. Left standing in room with NT providing direct supervision/care. Would benefit from skilled HHPT f/u, and definitely recommend 24/7S for safety moving forward.     Follow Up Recommendations Home health PT;Supervision/Assistance - 24 hour    Equipment Recommendations  Rolling walker with 5" wheels    Recommendations for Other Services       Precautions / Restrictions Precautions Precautions: Fall Restrictions Weight Bearing Restrictions: No      Mobility  Bed Mobility               General bed mobility comments: OOB sitting on couch upon entry    Transfers Overall transfer level: Needs assistance   Transfers: Sit to/from Stand Sit to Stand: Supervision         General transfer comment: S for general safety, no physical assist given  Ambulation/Gait Ambulation/Gait assistance: Min guard Gait Distance (Feet): 200 Feet Assistive device: None Gait Pattern/deviations: Step-through pattern;Decreased step length - right;Decreased step length - left;Trunk flexed Gait velocity: decreased   General Gait Details: slow but steady in the hallway, is a bit stiff with mild impairment in gait mechanics however able to mobilize well without significant LOB or unsteadiness  Stairs Stairs: Yes Stairs assistance: Min  assist Stair Management: One rail Left;Step to pattern;Forwards Number of Stairs: 4 General stair comments: unsteady on steps requiring close MinA for balance and Mod VC for safety; unable to perform without railing  Wheelchair Mobility    Modified Rankin (Stroke Patients Only)       Balance Overall balance assessment: Needs assistance Sitting-balance support: Feet supported Sitting balance-Leahy Scale: Normal     Standing balance support: No upper extremity supported;During functional activity Standing balance-Leahy Scale: Fair                               Pertinent Vitals/Pain Pain Assessment: Faces Faces Pain Scale: No hurt Pain Intervention(s): Limited activity within patient's tolerance;Monitored during session    Home Living Family/patient expects to be discharged to:: Private residence Living Arrangements: Spouse/significant other Available Help at Discharge: Family Type of Home: House Home Access: Stairs to enter Entrance Stairs-Rails: None Entrance Stairs-Number of Steps: 2 STE no rails Home Layout: Multi-level Home Equipment: None      Prior Function Level of Independence: Independent               Hand Dominance        Extremity/Trunk Assessment   Upper Extremity Assessment Upper Extremity Assessment: Generalized weakness    Lower Extremity Assessment Lower Extremity Assessment: Generalized weakness    Cervical / Trunk Assessment Cervical / Trunk Assessment: Kyphotic  Communication   Communication: No difficulties  Cognition Arousal/Alertness: Awake/alert Behavior During Therapy: Impulsive Overall Cognitive Status: Impaired/Different from baseline Area of Impairment: Attention;Memory;Following commands;Safety/judgement;Awareness;Problem solving  Current Attention Level: Selective Memory: Decreased short-term memory Following Commands: Follows one step commands inconsistently;Follows one step  commands with increased time Safety/Judgement: Decreased awareness of safety;Decreased awareness of deficits Awareness: Intellectual Problem Solving: Difficulty sequencing;Requires verbal cues General Comments: pleasant but impulsive- does seem to have some short term memory issues, tells me he had 3 boosters and could not remember where his room is. Cognition definitely seems off, unsure if impulsivity is baseline or not      General Comments      Exercises     Assessment/Plan    PT Assessment Patient needs continued PT services  PT Problem List Decreased strength;Decreased cognition;Decreased safety awareness;Decreased balance;Decreased mobility       PT Treatment Interventions DME instruction;Balance training;Gait training;Stair training;Cognitive remediation;Functional mobility training;Patient/family education;Therapeutic activities;Therapeutic exercise    PT Goals (Current goals can be found in the Care Plan section)  Acute Rehab PT Goals Patient Stated Goal: go home and get back to routine PT Goal Formulation: With patient Time For Goal Achievement: 04/14/21 Potential to Achieve Goals: Fair    Frequency Min 3X/week   Barriers to discharge        Co-evaluation               AM-PAC PT "6 Clicks" Mobility  Outcome Measure Help needed turning from your back to your side while in a flat bed without using bedrails?: None Help needed moving from lying on your back to sitting on the side of a flat bed without using bedrails?: None Help needed moving to and from a bed to a chair (including a wheelchair)?: None Help needed standing up from a chair using your arms (e.g., wheelchair or bedside chair)?: None Help needed to walk in hospital room?: A Little Help needed climbing 3-5 steps with a railing? : A Little 6 Click Score: 22    End of Session Equipment Utilized During Treatment: Gait belt Activity Tolerance: Patient tolerated treatment well Patient left: Other  (comment) (standing beside bed with NT in room attending) Nurse Communication: Mobility status PT Visit Diagnosis: Unsteadiness on feet (R26.81);Muscle weakness (generalized) (M62.81)    Time: 6712-4580 PT Time Calculation (min) (ACUTE ONLY): 16 min   Charges:   PT Evaluation $PT Eval Moderate Complexity: 1 Mod         Windell Norfolk, DPT, PN1   Supplemental Physical Therapist [place]    Pager [phone] Acute Rehab Office [phone]

## 2021-03-31 NOTE — Plan of Care (Signed)
  Problem: Health Behavior/Discharge Planning: Goal: Ability to manage health-related needs will improve 03/31/2021 0418 by Jacqualine Code, RN Outcome: Progressing 03/31/2021 0301 by Jacqualine Code, RN Outcome: Progressing   Problem: Activity: Goal: Risk for activity intolerance will decrease Outcome: Progressing   Problem: Safety: Goal: Ability to remain free from injury will improve 03/31/2021 0418 by Jacqualine Code, RN Outcome: Progressing 03/31/2021 0301 by Jacqualine Code, RN Outcome: Progressing   Problem: Skin Integrity: Goal: Risk for impaired skin integrity will decrease 03/31/2021 0418 by Jacqualine Code, RN Outcome: Progressing 03/31/2021 0301 by Jacqualine Code, RN Outcome: Progressing

## 2021-03-31 NOTE — Progress Notes (Addendum)
This writer was informed by telemetry pt was not seen on monitor. Writer went to the room to investigate why pt. Leads were not connected. Pt was sitting at the edge of the bed and had taken them off. Pt was asked why he took off the leads and he stated " The doctor said I don't need them. Why do I have to keep wearing them". Pt was asked questions regarding orientation and could not answer appropriately. Pt started yelling and telling this Probation officer and the aide also present to get out of his room. We explained that he was a fall risk but he insisted that we exit his room. MD was notified about pt condition. Will continue to monitor.  Dr. Nevada Crane came up to assess pt. and telesitter was ordered.

## 2021-03-31 NOTE — Plan of Care (Signed)
  Problem: Health Behavior/Discharge Planning: Goal: Ability to manage health-related needs will improve Outcome: Progressing   Problem: Safety: Goal: Ability to remain free from injury will improve Outcome: Progressing   Problem: Skin Integrity: Goal: Risk for impaired skin integrity will decrease Outcome: Progressing   

## 2021-03-31 NOTE — Discharge Instructions (Addendum)
Person Under Monitoring Name: Casey Lee  Location: [place] [address]   Infection Prevention Recommendations for Individuals Confirmed to have, or Being Evaluated for, 2019 Novel Coronavirus (COVID-19) Infection Who Receive Care at Home  Individuals who are confirmed to have, or are being evaluated for, COVID-19 should follow the prevention steps below until a healthcare provider or local or state health department says they can return to normal activities.  Stay home except to get medical care You should restrict activities outside your home, except for getting medical care. Do not go to work, school, or public areas, and do not use public transportation or taxis.  Call ahead before visiting your doctor Before your medical appointment, call the healthcare provider and tell them that you have, or are being evaluated for, COVID-19 infection. This will help the healthcare provider's office take steps to keep other people from getting infected. Ask your healthcare provider to call the local or state health department.  Monitor your symptoms Seek prompt medical attention if your illness is worsening (e.g., difficulty breathing). Before going to your medical appointment, call the healthcare provider and tell them that you have, or are being evaluated for, COVID-19 infection. Ask your healthcare provider to call the local or state health department.  Wear a facemask You should wear a facemask that covers your nose and mouth when you are in the same room with other people and when you visit a healthcare provider. People who live with or visit you should also wear a facemask while they are in the same room with you.  Separate yourself from other people in your home As much as possible, you should stay in a different room from other people in your home. Also, you should use a separate bathroom, if available.  Avoid sharing household items You should  not share dishes, drinking glasses, cups, eating utensils, towels, bedding, or other items with other people in your home. After using these items, you should wash them thoroughly with soap and water.  Cover your coughs and sneezes Cover your mouth and nose with a tissue when you cough or sneeze, or you can cough or sneeze into your sleeve. Throw used tissues in a lined trash can, and immediately wash your hands with soap and water for at least 20 seconds or use an alcohol-based hand rub.  Wash your [facility] your hands often and thoroughly with soap and water for at least 20 seconds. You can use an alcohol-based hand sanitizer if soap and water are not available and if your hands are not visibly dirty. Avoid touching your eyes, nose, and mouth with unwashed hands.   Prevention Steps for Caregivers and Household Members of Individuals Confirmed to have, or Being Evaluated for, COVID-19 Infection Being Cared for in the Home  If you live with, or provide care at home for, a person confirmed to have, or being evaluated for, COVID-19 infection please follow these guidelines to prevent infection:  Follow healthcare provider's instructions Make sure that you understand and can help the patient follow any healthcare provider instructions for all care.  Provide for the patient's basic needs You should help the patient with basic needs in the home and provide support for getting groceries, prescriptions, and other personal needs.  Monitor the patient's symptoms If they are getting sicker, call his or her medical provider and tell them that the patient has, or is being evaluated for, COVID-19 infection. This will help the healthcare provider's office  take steps to keep other people from getting infected. Ask the healthcare provider to call the local or state health department.  Limit the number of people who have contact with the patient If possible, have only one caregiver for the  patient. Other household members should stay in another home or place of residence. If this is not possible, they should stay in another room, or be separated from the patient as much as possible. Use a separate bathroom, if available. Restrict visitors who do not have an essential need to be in the home.  Keep older adults, very young children, and other sick people away from the patient Keep older adults, very young children, and those who have compromised immune systems or chronic health conditions away from the patient. This includes people with chronic heart, lung, or kidney conditions, diabetes, and cancer.  Ensure good ventilation Make sure that shared spaces in the home have good air flow, such as from an air conditioner or an opened window, weather permitting.  Wash your hands often Wash your hands often and thoroughly with soap and water for at least 20 seconds. You can use an alcohol based hand sanitizer if soap and water are not available and if your hands are not visibly dirty. Avoid touching your eyes, nose, and mouth with unwashed hands. Use disposable paper towels to dry your hands. If not available, use dedicated cloth towels and replace them when they become wet.  Wear a facemask and gloves Wear a disposable facemask at all times in the room and gloves when you touch or have contact with the patient's blood, body fluids, and/or secretions or excretions, such as sweat, saliva, sputum, nasal mucus, vomit, urine, or feces.  Ensure the mask fits over your nose and mouth tightly, and do not touch it during use. Throw out disposable facemasks and gloves after using them. Do not reuse. Wash your hands immediately after removing your facemask and gloves. If your personal clothing becomes contaminated, carefully remove clothing and launder. Wash your hands after handling contaminated clothing. Place all used disposable facemasks, gloves, and other waste in a lined container before  disposing them with other household waste. Remove gloves and wash your hands immediately after handling these items.  Do not share dishes, glasses, or other household items with the patient Avoid sharing household items. You should not share dishes, drinking glasses, cups, eating utensils, towels, bedding, or other items with a patient who is confirmed to have, or being evaluated for, COVID-19 infection. After the person uses these items, you should wash them thoroughly with soap and water.  Wash laundry thoroughly Immediately remove and wash clothes or bedding that have blood, body fluids, and/or secretions or excretions, such as sweat, saliva, sputum, nasal mucus, vomit, urine, or feces, on them. Wear gloves when handling laundry from the patient. Read and follow directions on labels of laundry or clothing items and detergent. In general, wash and dry with the warmest temperatures recommended on the label.  Clean all areas the individual has used often Clean all touchable surfaces, such as counters, tabletops, doorknobs, bathroom fixtures, toilets, phones, keyboards, tablets, and bedside tables, every day. Also, clean any surfaces that may have blood, body fluids, and/or secretions or excretions on them. Wear gloves when cleaning surfaces the patient has come in contact with. Use a diluted bleach solution (e.g., dilute bleach with 1 part bleach and 10 parts water) or a household disinfectant with a label that says EPA-registered for coronaviruses. To make a bleach  solution at home, add 1 tablespoon of bleach to 1 quart (4 cups) of water. For a larger supply, add  cup of bleach to 1 gallon (16 cups) of water. Read labels of cleaning products and follow recommendations provided on product labels. Labels contain instructions for safe and effective use of the cleaning product including precautions you should take when applying the product, such as wearing gloves or eye protection and making sure you  have good ventilation during use of the product. Remove gloves and wash hands immediately after cleaning.  Monitor yourself for signs and symptoms of illness Caregivers and household members are considered close contacts, should monitor their health, and will be asked to limit movement outside of the home to the extent possible. Follow the monitoring steps for close contacts listed on the symptom monitoring form.   ? If you have additional questions, contact your local health department or call the epidemiologist on call at [phone] (available 24/7). ? This guidance is subject to change. For the most up-to-date guidance from [facility], please refer to their website: [email or web address].pl   Information on my medicine - ELIQUIS (apixaban)  This medication education was reviewed with me or my healthcare representative as part of my discharge preparation.  The pharmacist that spoke with me during my hospital stay was:    Why was Eliquis prescribed for you? Eliquis was prescribed for you to reduce the risk of a blood clot forming that can cause a stroke if you have a medical condition called atrial fibrillation (a type of irregular heartbeat).  What do You need to know about Eliquis ? Take your Eliquis TWICE DAILY - one tablet in the morning and one tablet in the evening with or without food. If you have difficulty swallowing the tablet whole please discuss with your pharmacist how to take the medication safely.  Take Eliquis exactly as prescribed by your doctor and DO NOT stop taking Eliquis without talking to the doctor who prescribed the medication.  Stopping may increase your risk of developing a stroke.  Refill your prescription before you run out.  After discharge, you should have regular check-up appointments with your healthcare provider that is prescribing your Eliquis.  In the future your dose may need to be changed if your  kidney function or weight changes by a significant amount or as you get older.  What do you do if you miss a dose? If you miss a dose, take it as soon as you remember on the same day and resume taking twice daily.  Do not take more than one dose of ELIQUIS at the same time to make up a missed dose.  Important Safety Information A possible side effect of Eliquis is bleeding. You should call your healthcare provider right away if you experience any of the following: Bleeding from an injury or your nose that does not stop. Unusual colored urine (red or dark brown) or unusual colored stools (red or black). Unusual bruising for unknown reasons. A serious fall or if you hit your head (even if there is no bleeding).  Some medicines may interact with Eliquis and might increase your risk of bleeding or clotting while on Eliquis. To help avoid this, consult your healthcare provider or pharmacist prior to using any new prescription or non-prescription medications, including herbals, vitamins, non-steroidal anti-inflammatory drugs (NSAIDs) and supplements.  This website has more information on Eliquis (apixaban): http://[email or web address].com/[email or web address]

## 2021-03-31 NOTE — Consult Note (Signed)
WOC Nurse Consult Note: Reason for Consult: left elbow skin tear Wound type: trauma Pressure Injury POA: N/A Measurement: 0.8cm x 0.5cm x 0.1cm Wound bed: red, dry Drainage (amount, consistency, odor) none Periwound: intact Dressing procedure/placement/frequency: I will provide Nursing with guidance for the care of this partial thickness lesion using a thin layer of bacitracin ointment applied twice daily after cleansing and topped with a dry gauze 2x2. This will be secured with a silicone foam dressing.   [place] nursing team will not follow, but will remain available to this patient, the nursing and medical teams.  Please re-consult if needed. Thanks, Maudie Flakes, MSN, RN, [place], Arther Abbott  Pager# [phone]

## 2021-03-31 NOTE — Progress Notes (Signed)
PROGRESS NOTE        PATIENT DETAILS Name: Casey Lee Age: 87 y.o. Sex: male Date of Birth: Dec 06, 1927 Admit Date: 03/30/2021 Admitting Physician Karmen Bongo, MD LSL:HTDSK, [name], MD  Brief Narrative: Patient is a 87 y.o. male persistent atrial fibrillation on Eliquis, HLD-we will presented with fever, slight confusion-he was found to have sepsis physiology and started on broad-spectrum antimicrobial therapy.  He does acknowledge dysuria for 3 days prior to this hospitalization.  He has a long history of urinary urgency/hesitancy and poor stream.  Significant events: 6/28>> admit for sepsis-likely due to UTI.  Significant studies: 6/28>> CXR: No pneumonia 6/29>> CXR: Possible mild CHF.  Antimicrobial therapy: Cefepime: 6/28 x 1 Vancomycin: 6/2 x1 Flagyl: 6/28>> 6/29 Ceftriaxone: 6/29>>  Microbiology data: 6/28>> COVID/influenza PCR: Negative 6/28>> blood culture: No growth 6/28>> urine culture: Pending  Procedures : None  Consults: None  DVT Prophylaxis : apixaban (ELIQUIS) tablet 2.5 mg Start: 03/30/21 2200 apixaban (ELIQUIS) tablet 2.5 mg    Subjective: Afebrile overnight-some mild shortness of breath with exertion this morning.  Acknowledges 3-day history of dysuria-however after starting antimicrobial therapy-dysuria has resolved.  Acknowledges hesitancy/urgency with urination-and poor stream for the past several years.  Assessment/Plan: Sepsis due to complicated UTI: Sepsis physiology has improved-stop vancomycin/cefepime/Flagyl-we will start IV ceftriaxone.  Start titrating down stress dose hydrocortisone.  Await culture data.  Suspect complicated UTI caused by underlying BPH.  Probable BPH: Acknowledges symptoms of prostatism for the past few years-starting Flomax-once infection issues overnight-we will need outpatient follow-up with urology.  Monitor with frequent bladder scans while inpatient-however does not have a  distended bladder on my exam.  CKD stage IIIb: Creatinine close to baseline.  Follow periodically.  Persistent atrial fibrillation: Continue Eliquis (dose adjusted by pharmacy to 2.5 mg twice daily)-hold Toprol for now.  Mild exertional dyspnea: Volume status stable-few bibasilar rales-on room air.  We will give 1 dose of IV Lasix-and reassess tomorrow.   Diet: Diet Order             Diet regular Room service appropriate? Yes; Fluid consistency: Thin  Diet effective now                    Code Status: Full code   Family Communication: Son at bedside   Disposition Plan: Home vs Home with Home health vs SNF when ready for discharge Status is: Inpatient  Remains inpatient appropriate because:Inpatient level of care appropriate due to severity of illness  Dispo: The patient is from: Home              Anticipated d/c is to: Home              Patient currently is not medically stable to d/c.   Difficult to place patient No   Barriers to Discharge: Resolving sepsis physiology-on IV antibiotics-awaiting culture data  Antimicrobial agents: Anti-infectives (From admission, onward)    Start     Dose/Rate Route Frequency Ordered Stop   03/31/21 1700  vancomycin (VANCOREADY) IVPB 750 mg/150 mL        750 mg 150 mL/hr over 60 Minutes Intravenous Every 24 hours 03/30/21 1719     03/31/21 0000  metroNIDAZOLE (FLAGYL) IVPB 500 mg        500 mg 100 mL/hr over 60 Minutes Intravenous Every 8 hours 03/30/21 1717  03/30/21 1800  ceFEPIme (MAXIPIME) 2 g in sodium chloride 0.9 % 100 mL IVPB        2 g 200 mL/hr over 30 Minutes Intravenous Every 24 hours 03/30/21 1717     03/30/21 1730  vancomycin (VANCOREADY) IVPB 1500 mg/300 mL        1,500 mg 150 mL/hr over 120 Minutes Intravenous  Once 03/30/21 1608 03/30/21 2031   03/30/21 1615  metroNIDAZOLE (FLAGYL) IVPB 500 mg        500 mg 100 mL/hr over 60 Minutes Intravenous  Once 03/30/21 1608 03/30/21 1732   03/30/21 1400   cefTRIAXone (ROCEPHIN) 1 g in sodium chloride 0.9 % 100 mL IVPB        1 g 200 mL/hr over 30 Minutes Intravenous  Once 03/30/21 1358 03/30/21 1523        Time spent: 35 minutes-Greater than 50% of this time was spent in counseling, explanation of diagnosis, planning of further management, and coordination of care.  MEDICATIONS: Scheduled Meds:  apixaban  2.5 mg Oral BID   bacitracin   Topical BID   hydrocortisone sod succinate (SOLU-CORTEF) inj  50 mg Intravenous Q6H   loratadine  10 mg Oral Daily   sodium chloride flush  3 mL Intravenous Q12H   Continuous Infusions:  ceFEPime (MAXIPIME) IV Stopped (03/30/21 1931)   metronidazole Stopped (03/31/21 0949)   vancomycin     PRN Meds:.acetaminophen **OR** acetaminophen, ondansetron **OR** ondansetron (ZOFRAN) IV   PHYSICAL EXAM: Vital signs: Vitals:   03/30/21 2012 03/30/21 2120 03/31/21 0529 03/31/21 1014  BP: 99/73  127/79 122/70  Pulse: 78  90 84  Resp: 17  18 18   Temp: 97.8 F (36.6 C)  97.7 F (36.5 C) (!) 97.5 F (36.4 C)  TempSrc: Oral  Tympanic Oral  SpO2: 95%  93% 94%  Weight:  66.2 kg 67.1 kg   Height:  5\' 9"  (1.753 m)     Filed Weights   03/30/21 1700 03/30/21 2120 03/31/21 0529  Weight: 66 kg 66.2 kg 67.1 kg   Body mass index is 21.85 kg/m.   Gen Exam:Alert awake-not in any distress HEENT:atraumatic, normocephalic Chest: B/L clear to auscultation anteriorly CVS:S1S2 regular Abdomen:soft non tender, non distended Extremities:no edema Neurology: Non focal Skin: no rash  I have personally reviewed following labs and imaging studies  LABORATORY DATA: CBC: Recent Labs  Lab 03/30/21 1415 03/31/21 0426  WBC 14.9* 17.2*  NEUTROABS 13.0*  --   HGB 13.7 13.1  HCT 43.1 39.9  MCV 96.2 94.8  PLT 148* 135*    Basic Metabolic Panel: Recent Labs  Lab 03/30/21 1415 03/31/21 0426  NA 136 136  K 4.0 4.4  CL 100 107  CO2 25 20*  GLUCOSE 164* 169*  BUN 25* 29*  CREATININE 1.75* 1.60*  CALCIUM  9.7 9.3    GFR: Estimated Creatinine Clearance: 29.1 mL/min (A) (by C-G formula based on SCr of 1.6 mg/dL (H)).  Liver Function Tests: Recent Labs  Lab 03/30/21 1415  AST 26  ALT 22  ALKPHOS 48  BILITOT 1.0  PROT 6.3*  ALBUMIN 3.6   No results for input(s): LIPASE, AMYLASE in the last 168 hours. No results for input(s): AMMONIA in the last 168 hours.  Coagulation Profile: Recent Labs  Lab 03/30/21 1415  INR 1.6*    Cardiac Enzymes: No results for input(s): CKTOTAL, CKMB, CKMBINDEX, TROPONINI in the last 168 hours.  BNP (last 3 results) No results for input(s): PROBNP in the last 8760  hours.  Lipid Profile: No results for input(s): CHOL, HDL, LDLCALC, TRIG, CHOLHDL, LDLDIRECT in the last 72 hours.  Thyroid Function Tests: No results for input(s): TSH, T4TOTAL, FREET4, T3FREE, THYROIDAB in the last 72 hours.  Anemia Panel: No results for input(s): VITAMINB12, FOLATE, FERRITIN, TIBC, IRON, RETICCTPCT in the last 72 hours.  Urine analysis:    Component Value Date/Time   COLORURINE YELLOW 03/30/2021 1435   APPEARANCEUR HAZY (A) 03/30/2021 1435   LABSPEC 1.017 03/30/2021 1435   PHURINE 5.0 03/30/2021 1435   GLUCOSEU NEGATIVE 03/30/2021 1435   HGBUR SMALL (A) 03/30/2021 1435   BILIRUBINUR NEGATIVE 03/30/2021 1435   KETONESUR NEGATIVE 03/30/2021 1435   PROTEINUR NEGATIVE 03/30/2021 1435   NITRITE NEGATIVE 03/30/2021 1435   LEUKOCYTESUR SMALL (A) 03/30/2021 1435    Sepsis Labs: Lactic Acid, Venous    Component Value Date/Time   LATICACIDVEN 1.5 03/30/2021 1631    MICROBIOLOGY: Recent Results (from the past 240 hour(s))  Blood Culture (routine x 2)     Status: None (Preliminary result)   Collection Time: 03/30/21  2:19 PM   Specimen: BLOOD  Result Value Ref Range Status   Specimen Description BLOOD LEFT ANTECUBITAL  Final   Special Requests   Final    BOTTLES DRAWN AEROBIC AND ANAEROBIC Blood Culture adequate volume   Culture   Final    NO GROWTH < 24  HOURS Performed at [facility], [place] [address], [place], [place] [address]    Report Status PENDING  Incomplete  Blood Culture (routine x 2)     Status: None (Preliminary result)   Collection Time: 03/30/21  2:19 PM   Specimen: BLOOD  Result Value Ref Range Status   Specimen Description BLOOD RIGHT ANTECUBITAL  Final   Special Requests   Final    BOTTLES DRAWN AEROBIC AND ANAEROBIC Blood Culture adequate volume   Culture   Final    NO GROWTH < 24 HOURS Performed at [facility], [place] [address] [place], [place] [address]    Report Status PENDING  Incomplete  Resp Panel by RT-PCR (Flu A&B, Covid) Nasopharyngeal Swab     Status: None   Collection Time: 03/30/21  2:52 PM   Specimen: Nasopharyngeal Swab; Nasopharyngeal(NP) swabs in vial transport medium  Result Value Ref Range Status   SARS Coronavirus 2 by RT PCR NEGATIVE NEGATIVE Final    Comment: (NOTE) SARS-CoV-2 target nucleic acids are NOT DETECTED.  The SARS-CoV-2 RNA is generally detectable in upper respiratory specimens during the acute phase of infection. The lowest concentration of SARS-CoV-2 viral copies this assay can detect is 138 copies/mL. A negative result does not preclude SARS-Cov-2 infection and should not be used as the sole basis for treatment or other patient management decisions. A negative result may occur with  improper specimen collection/handling, submission of specimen other than nasopharyngeal swab, presence of viral mutation(s) within the areas targeted by this assay, and inadequate number of viral copies(<138 copies/mL). A negative result must be combined with clinical observations, patient history, and epidemiological information. The expected result is Negative.  Fact Sheet for Patients:  EntrepreneurPulse.com.au  Fact Sheet for Healthcare Providers:  IncredibleEmployment.be  This test is no t yet approved or cleared by the [place] FDA and   has been authorized for detection and/or diagnosis of SARS-CoV-2 by FDA under an Emergency Use Authorization (EUA). This EUA will remain  in effect (meaning this test can be used) for the duration of the COVID-19 declaration under Section  564(b)(1) of the Act, 21 U.S.C.section 360bbb-3(b)(1), unless the authorization is terminated  or revoked sooner.       Influenza A by PCR NEGATIVE NEGATIVE Final   Influenza B by PCR NEGATIVE NEGATIVE Final    Comment: (NOTE) The Xpert Xpress SARS-CoV-2/FLU/RSV plus assay is intended as an aid in the diagnosis of influenza from Nasopharyngeal swab specimens and should not be used as a sole basis for treatment. Nasal washings and aspirates are unacceptable for Xpert Xpress SARS-CoV-2/FLU/RSV testing.  Fact Sheet for Patients: EntrepreneurPulse.com.au  Fact Sheet for Healthcare Providers: IncredibleEmployment.be  This test is not yet approved or cleared by the [place] FDA and has been authorized for detection and/or diagnosis of SARS-CoV-2 by FDA under an Emergency Use Authorization (EUA). This EUA will remain in effect (meaning this test can be used) for the duration of the COVID-19 declaration under Section 564(b)(1) of the Act, 21 U.S.C. section 360bbb-3(b)(1), unless the authorization is terminated or revoked.  Performed at [facility], [place] [address] [place], [place] [address]     RADIOLOGY STUDIES/RESULTS: DG Chest Port 1 View  Result Date: 03/30/2021 CLINICAL DATA:  Question sepsis EXAM: PORTABLE CHEST 1 VIEW COMPARISON:  05/11/2018 FINDINGS: The heart size and mediastinal contours are within normal limits. Both lungs are clear. The visualized skeletal structures are unremarkable. IMPRESSION: No active disease. Electronically Signed   By: Franchot Gallo M.D.   On: 03/30/2021 14:58   DG Chest Port 1V same Day  Result Date: 03/31/2021 CLINICAL DATA:  Shortness of breath.  Cough. EXAM:  PORTABLE CHEST 1 VIEW COMPARISON:  03/30/2021. FINDINGS: Cardiomegaly. Mild pulmonary venous congestion. Very mild bilateral interstitial prominence. Findings suggest mild CHF. Pneumonitis cannot be completely excluded. No pleural effusion or pneumothorax. IMPRESSION: Cardiomegaly with mild pulmonary venous congestion and very mild bilateral interstitial prominence. Findings suggest mild CHF. Pneumonitis cannot be completely excluded. Electronically Signed   By: Marcello Moores  Register   On: 03/31/2021 08:05     LOS: 1 day   Oren Binet, MD  [facility]    To contact the attending provider between 7A-7P or the covering provider during after hours 7P-7A, please log into the web site [email or web address] and access using universal [place] password for that web site. If you do not have the password, please call the hospital operator.  03/31/2021, 12:29 PM

## 2021-03-31 NOTE — Plan of Care (Signed)
  Problem: Activity: Goal: Risk for activity intolerance will decrease Outcome: Progressing   Problem: Skin Integrity: Goal: Risk for impaired skin integrity will decrease Outcome: Progressing   

## 2021-04-01 LAB — PROCALCITONIN: Procalcitonin: 0.24 ng/mL

## 2021-04-01 LAB — BRAIN NATRIURETIC PEPTIDE: B Natriuretic Peptide: 515.2 pg/mL — ABNORMAL HIGH (ref 0.0–100.0)

## 2021-04-01 LAB — MAGNESIUM: Magnesium: 1.8 mg/dL (ref 1.7–2.4)

## 2021-04-01 MED ORDER — HALOPERIDOL LACTATE 5 MG/ML IJ SOLN: 2.0000 mg | Freq: Four times a day (QID) | INTRAMUSCULAR | Status: DC | PRN

## 2021-04-01 MED ORDER — HYDROCORTISONE NA SUCCINATE PF 100 MG IJ SOLR
25.0000 mg | Freq: Every day | INTRAMUSCULAR | Status: AC
  Administered 2021-04-02: 25 mg via INTRAVENOUS
  Filled 2021-04-01: qty 2

## 2021-04-01 MED ORDER — LACTATED RINGERS IV SOLN: INTRAVENOUS | Status: AC

## 2021-04-01 MED ORDER — LORAZEPAM 2 MG/ML IJ SOLN
INTRAMUSCULAR | Status: AC
  Administered 2021-04-01: 2 mg via INTRAVENOUS
  Filled 2021-04-01: qty 1

## 2021-04-01 MED ORDER — HALOPERIDOL LACTATE 5 MG/ML IJ SOLN
2.5000 mg | INTRAMUSCULAR | Status: DC
  Filled 2021-04-01: qty 1

## 2021-04-01 MED ORDER — LACTATED RINGERS IV SOLN: INTRAVENOUS | Status: DC

## 2021-04-01 MED ORDER — LORAZEPAM 2 MG/ML IJ SOLN: 2.0000 mg | INTRAMUSCULAR | Status: AC

## 2021-04-01 NOTE — Progress Notes (Signed)
PROGRESS NOTE        PATIENT DETAILS Name: Casey Lee Age: 87 y.o. Sex: male Date of Birth: September 02, 1928 Admit Date: 03/30/2021 Admitting Physician Karmen Bongo, MD HQI:ONGEX, [name], MD  Brief Narrative: Patient is a 87 y.o. male persistent atrial fibrillation on Eliquis, HLD-we will presented with fever, slight confusion-he was found to have sepsis physiology and started on broad-spectrum antimicrobial therapy.  He does acknowledge dysuria for 3 days prior to this hospitalization.  He has a long history of urinary urgency/hesitancy and poor stream.  Significant events: 6/28>> admit for sepsis-likely due to UTI.  Significant studies: 6/28>> CXR: No pneumonia 6/29>> CXR: Possible mild CHF.  Antimicrobial therapy: Cefepime: 6/28 x 1 Vancomycin: 6/2 x1 Flagyl: 6/28>> 6/29 Ceftriaxone: 6/29>>  Microbiology data: 6/28>> COVID/influenza PCR: Negative 6/28>> blood culture: No growth 6/28>> urine culture: Pending  Procedures : None  Consults: None  DVT Prophylaxis : apixaban (ELIQUIS) tablet 2.5 mg Start: 03/30/21 2200 apixaban (ELIQUIS) tablet 2.5 mg    Subjective:  Patient in bed sedated after he received moderate dose of Ativan early in the morning for agitation, does not appear to be in any distress.  Assessment/Plan:  Sepsis due to complicated UTI: On Rocephin. Await culture data, noted prelim urine cultures with Citrobacter.  Suspect complicated UTI caused by underlying BPH.  Continue gentle IV fluids and supportive care. Taper off Steroids.  Sepsis pathophysiology has almost completely resolved.  Severe delirium.  Supportive care, fall precautions, son updated bedside on 04/01/2021, Haldol as needed, avoid Ativan.    Probable BPH: Acknowledges symptoms of prostatism for the past few years-starting Flomax-once infection issues overnight-we will need outpatient follow-up with urology.  Monitor with frequent bladder scans while  inpatient-however does not have a distended bladder on my exam.  CKD stage IIIb: Creatinine close to baseline.  Follow periodically.  Persistent atrial fibrillation: Continue Eliquis (dose adjusted by pharmacy to 2.5 mg twice daily)-hold Toprol for now.  Mild exertional dyspnea: Volume status stable-few bibasilar rales-on room air.  We will give 1 dose of IV Lasix-resolved.   Diet: Diet Order             Diet regular Room service appropriate? Yes; Fluid consistency: Thin  Diet effective now                    Code Status: Full code   Family Communication: Son at bedside - 04/01/21   Disposition Plan: Home vs Home with Home health vs SNF when ready for discharge Status is: Inpatient  Remains inpatient appropriate because:Inpatient level of care appropriate due to severity of illness  Dispo: The patient is from: Home              Anticipated d/c is to: Home              Patient currently is not medically stable to d/c.   Difficult to place patient No   Barriers to Discharge: Resolving sepsis physiology-on IV antibiotics-awaiting culture data  Antimicrobial agents: Anti-infectives (From admission, onward)    Start     Dose/Rate Route Frequency Ordered Stop   03/31/21 1700  vancomycin (VANCOREADY) IVPB 750 mg/150 mL  Status:  Discontinued        750 mg 150 mL/hr over 60 Minutes Intravenous Every 24 hours 03/30/21 1719 03/31/21 1238   03/31/21 1330  cefTRIAXone (ROCEPHIN)  1 g in sodium chloride 0.9 % 100 mL IVPB        1 g 200 mL/hr over 30 Minutes Intravenous Every 24 hours 03/31/21 1238     03/31/21 0000  metroNIDAZOLE (FLAGYL) IVPB 500 mg  Status:  Discontinued        500 mg 100 mL/hr over 60 Minutes Intravenous Every 8 hours 03/30/21 1717 03/31/21 1238   03/30/21 1800  ceFEPIme (MAXIPIME) 2 g in sodium chloride 0.9 % 100 mL IVPB  Status:  Discontinued        2 g 200 mL/hr over 30 Minutes Intravenous Every 24 hours 03/30/21 1717 03/31/21 1238   03/30/21 1730   vancomycin (VANCOREADY) IVPB 1500 mg/300 mL        1,500 mg 150 mL/hr over 120 Minutes Intravenous  Once 03/30/21 1608 03/30/21 2031   03/30/21 1615  metroNIDAZOLE (FLAGYL) IVPB 500 mg        500 mg 100 mL/hr over 60 Minutes Intravenous  Once 03/30/21 1608 03/30/21 1732   03/30/21 1400  cefTRIAXone (ROCEPHIN) 1 g in sodium chloride 0.9 % 100 mL IVPB        1 g 200 mL/hr over 30 Minutes Intravenous  Once 03/30/21 1358 03/30/21 1523        Time spent: 35 minutes-Greater than 50% of this time was spent in counseling, explanation of diagnosis, planning of further management, and coordination of care.  MEDICATIONS: Scheduled Meds:  apixaban  2.5 mg Oral BID   bacitracin   Topical BID   [START ON 04/02/2021] hydrocortisone sod succinate (SOLU-CORTEF) inj  25 mg Intravenous Daily   loratadine  10 mg Oral Daily   melatonin  5 mg Oral Once   sodium chloride flush  3 mL Intravenous Q12H   tamsulosin  0.4 mg Oral QPC supper   Continuous Infusions:  cefTRIAXone (ROCEPHIN)  IV 200 mL/hr at 04/01/21 1022   lactated ringers     PRN Meds:.acetaminophen **OR** [DISCONTINUED] acetaminophen, haloperidol lactate, [DISCONTINUED] ondansetron **OR** ondansetron (ZOFRAN) IV   PHYSICAL EXAM: Vital signs: Vitals:   03/31/21 1014 03/31/21 1640 03/31/21 2045 04/01/21 1001  BP: 122/70 110/72 107/78 125/67  Pulse: 84 91 87 83  Resp: 18 18 17 19   Temp: (!) 97.5 F (36.4 C) 98.2 F (36.8 C) 98.4 F (36.9 C) 98.1 F (36.7 C)  TempSrc: Oral     SpO2: 94% 96% 95% 97%  Weight:   67.1 kg   Height:       Filed Weights   03/30/21 2120 03/31/21 0529 03/31/21 2045  Weight: 66.2 kg 67.1 kg 67.1 kg   Body mass index is 21.85 kg/m.   Gen Exam:  Somnolent, moves all 4 extremities to stimuli [place].AT,PERRAL Supple Neck,No JVD, No cervical lymphadenopathy appriciated.  Symmetrical Chest wall movement, Good air movement bilaterally, CTAB RRR,No Gallops, Rubs or new Murmurs, No Parasternal Heave +ve  B.Sounds, Abd Soft, No tenderness, No organomegaly appriciated, No rebound - guarding or rigidity. No Cyanosis, Clubbing or edema, No new Rash or bruise  I have personally reviewed following labs and imaging studies  LABORATORY DATA: CBC: Recent Labs  Lab 03/30/21 1415 03/31/21 0426  WBC 14.9* 17.2*  NEUTROABS 13.0*  --   HGB 13.7 13.1  HCT 43.1 39.9  MCV 96.2 94.8  PLT 148* 135*    Basic Metabolic Panel: Recent Labs  Lab 03/30/21 1415 03/31/21 0426 04/01/21 0748  NA 136 136  --   K 4.0 4.4  --   CL 100  107  --   CO2 25 20*  --   GLUCOSE 164* 169*  --   BUN 25* 29*  --   CREATININE 1.75* 1.60*  --   CALCIUM 9.7 9.3  --   MG  --   --  1.8    GFR: Estimated Creatinine Clearance: 29.1 mL/min (A) (by C-G formula based on SCr of 1.6 mg/dL (H)).  Liver Function Tests: Recent Labs  Lab 03/30/21 1415  AST 26  ALT 22  ALKPHOS 48  BILITOT 1.0  PROT 6.3*  ALBUMIN 3.6   No results for input(s): LIPASE, AMYLASE in the last 168 hours. No results for input(s): AMMONIA in the last 168 hours.  Coagulation Profile: Recent Labs  Lab 03/30/21 1415  INR 1.6*    Cardiac Enzymes: No results for input(s): CKTOTAL, CKMB, CKMBINDEX, TROPONINI in the last 168 hours.  BNP (last 3 results) No results for input(s): PROBNP in the last 8760 hours.  Lipid Profile: No results for input(s): CHOL, HDL, LDLCALC, TRIG, CHOLHDL, LDLDIRECT in the last 72 hours.  Thyroid Function Tests: No results for input(s): TSH, T4TOTAL, FREET4, T3FREE, THYROIDAB in the last 72 hours.  Anemia Panel: No results for input(s): VITAMINB12, FOLATE, FERRITIN, TIBC, IRON, RETICCTPCT in the last 72 hours.  Urine analysis:    Component Value Date/Time   COLORURINE YELLOW 03/30/2021 1435   APPEARANCEUR HAZY (A) 03/30/2021 1435   LABSPEC 1.017 03/30/2021 1435   PHURINE 5.0 03/30/2021 1435   GLUCOSEU NEGATIVE 03/30/2021 1435   HGBUR SMALL (A) 03/30/2021 1435   BILIRUBINUR NEGATIVE 03/30/2021 1435    KETONESUR NEGATIVE 03/30/2021 1435   PROTEINUR NEGATIVE 03/30/2021 1435   NITRITE NEGATIVE 03/30/2021 1435   LEUKOCYTESUR SMALL (A) 03/30/2021 1435    Sepsis Labs: Lactic Acid, Venous    Component Value Date/Time   LATICACIDVEN 1.5 03/30/2021 1631    MICROBIOLOGY: Recent Results (from the past 240 hour(s))  Blood Culture (routine x 2)     Status: None (Preliminary result)   Collection Time: 03/30/21  2:19 PM   Specimen: BLOOD  Result Value Ref Range Status   Specimen Description BLOOD LEFT ANTECUBITAL  Final   Special Requests   Final    BOTTLES DRAWN AEROBIC AND ANAEROBIC Blood Culture adequate volume   Culture   Final    NO GROWTH 2 DAYS Performed at [facility], [place] [address] [place], [place] [address]    Report Status PENDING  Incomplete  Blood Culture (routine x 2)     Status: None (Preliminary result)   Collection Time: 03/30/21  2:19 PM   Specimen: BLOOD  Result Value Ref Range Status   Specimen Description BLOOD RIGHT ANTECUBITAL  Final   Special Requests   Final    BOTTLES DRAWN AEROBIC AND ANAEROBIC Blood Culture adequate volume   Culture   Final    NO GROWTH 2 DAYS Performed at [facility], [place] [address], [place], [place] [address]    Report Status PENDING  Incomplete  Resp Panel by RT-PCR (Flu A&B, Covid) Nasopharyngeal Swab     Status: None   Collection Time: 03/30/21  2:52 PM   Specimen: Nasopharyngeal Swab; Nasopharyngeal(NP) swabs in vial transport medium  Result Value Ref Range Status   SARS Coronavirus 2 by RT PCR NEGATIVE NEGATIVE Final    Comment: (NOTE) SARS-CoV-2 target nucleic acids are NOT DETECTED.  The SARS-CoV-2 RNA is generally detectable in upper respiratory specimens during the acute phase of infection. The lowest concentration  of SARS-CoV-2 viral copies this assay can detect is 138 copies/mL. A negative result does not preclude SARS-Cov-2 infection and should not be used as the sole basis for treatment or other  patient management decisions. A negative result may occur with  improper specimen collection/handling, submission of specimen other than nasopharyngeal swab, presence of viral mutation(s) within the areas targeted by this assay, and inadequate number of viral copies(<138 copies/mL). A negative result must be combined with clinical observations, patient history, and epidemiological information. The expected result is Negative.  Fact Sheet for Patients:  EntrepreneurPulse.com.au  Fact Sheet for Healthcare Providers:  IncredibleEmployment.be  This test is no t yet approved or cleared by the [place] FDA and  has been authorized for detection and/or diagnosis of SARS-CoV-2 by FDA under an Emergency Use Authorization (EUA). This EUA will remain  in effect (meaning this test can be used) for the duration of the COVID-19 declaration under Section 564(b)(1) of the Act, 21 U.S.C.section 360bbb-3(b)(1), unless the authorization is terminated  or revoked sooner.       Influenza A by PCR NEGATIVE NEGATIVE Final   Influenza B by PCR NEGATIVE NEGATIVE Final    Comment: (NOTE) The Xpert Xpress SARS-CoV-2/FLU/RSV plus assay is intended as an aid in the diagnosis of influenza from Nasopharyngeal swab specimens and should not be used as a sole basis for treatment. Nasal washings and aspirates are unacceptable for Xpert Xpress SARS-CoV-2/FLU/RSV testing.  Fact Sheet for Patients: EntrepreneurPulse.com.au  Fact Sheet for Healthcare Providers: IncredibleEmployment.be  This test is not yet approved or cleared by the [place] FDA and has been authorized for detection and/or diagnosis of SARS-CoV-2 by FDA under an Emergency Use Authorization (EUA). This EUA will remain in effect (meaning this test can be used) for the duration of the COVID-19 declaration under Section 564(b)(1) of the Act, 21 U.S.C. section  360bbb-3(b)(1), unless the authorization is terminated or revoked.  Performed at [facility], [place] [address] [place], [place] [address]   Urine culture     Status: Abnormal (Preliminary result)   Collection Time: 03/30/21  5:32 PM   Specimen: In/Out Cath Urine  Result Value Ref Range Status   Specimen Description IN/OUT CATH URINE  Final   Special Requests NONE  Final   Culture (A)  Final    >=100,000 COLONIES/mL CITROBACTER KOSERI SUSCEPTIBILITIES TO FOLLOW Performed at [facility], [place] [address], [place],  [address]    Report Status PENDING  Incomplete    RADIOLOGY STUDIES/RESULTS: DG Chest Port 1 View  Result Date: 03/30/2021 CLINICAL DATA:  Question sepsis EXAM: PORTABLE CHEST 1 VIEW COMPARISON:  05/11/2018 FINDINGS: The heart size and mediastinal contours are within normal limits. Both lungs are clear. The visualized skeletal structures are unremarkable. IMPRESSION: No active disease. Electronically Signed   By: Franchot Gallo M.D.   On: 03/30/2021 14:58   DG Chest Port 1V same Day  Result Date: 03/31/2021 CLINICAL DATA:  Shortness of breath.  Cough. EXAM: PORTABLE CHEST 1 VIEW COMPARISON:  03/30/2021. FINDINGS: Cardiomegaly. Mild pulmonary venous congestion. Very mild bilateral interstitial prominence. Findings suggest mild CHF. Pneumonitis cannot be completely excluded. No pleural effusion or pneumothorax. IMPRESSION: Cardiomegaly with mild pulmonary venous congestion and very mild bilateral interstitial prominence. Findings suggest mild CHF. Pneumonitis cannot be completely excluded. Electronically Signed   By: Marcello Moores  Register   On: 03/31/2021 08:05     LOS: 2 days  Signature  Lala Lund M.D on 04/01/2021 at 11:31 AM   -  To page go to [email or web address]

## 2021-04-01 NOTE — Plan of Care (Signed)
  Problem: Activity: Goal: Risk for activity intolerance will decrease Outcome: Progressing   Problem: Safety: Goal: Ability to remain free from injury will improve Outcome: Progressing   

## 2021-04-01 NOTE — Progress Notes (Signed)
Physical Therapy Treatment Patient Details Name: Casey Lee MRN: [id number] DOB: Feb 28, 1928 Today's Date: 04/01/2021    History of Present Illness 87yo admitted 6/28 with c/o acute confusion and AMS different from his baseline. Found to be in sepsis. PMH Afib, HOH, HLD, temporal arteritis, TIA, UTI, vertigo    PT Comments    Bed alarm going off when PT entered room, son present trying to get pt's LE back in bed. Son reports pt has been lethargic and confused since Ativan dose last night. Pt attempted to redirect pt back to bed but unsuccessful so assist to safe sitting with modA for bringing trunk to upright. Pt requiring varying levels of assist to maintain seated balance, eventually able to steady enough to put shoes on for transfer to standing. Pt requires modA for coming to standing and max A for lateral stepping with face to face support to come to the HoB. Pt requiring increase multimodal cuing for all activity, following approximately 10% of time. Hopefully once pt has metabolized medication he will have increase command follow and safety. PT continues to recommend HHPT. PT will continue to follow acutely.     Follow Up Recommendations  Home health PT;Supervision/Assistance - 24 hour     Equipment Recommendations  Rolling walker with 5" wheels       Precautions / Restrictions Precautions Precautions: Fall Restrictions Weight Bearing Restrictions: No    Mobility  Bed Mobility Overal bed mobility: Needs Assistance Bed Mobility: Supine to Sit;Sit to Supine     Supine to sit: Mod assist Sit to supine: Min assist   General bed mobility comments: bed alarm sounding on entry with pt LE out of bed, pt with eyes closed and limited response to therapist as he attempts to slide towards EoB, provided maximal multimodal cues for bringing trunk to upright to sit on EoB, pt requires min A for managing LE back into bed.    Transfers Overall transfer level: Needs assistance    Transfers: Sit to/from Stand Sit to Stand: Mod assist         General transfer comment: modA for power up and steadying at EoB, maximal cuing for bringing CoG over BoS, pt with increased posterior lean and steadying against bed.  Ambulation/Gait Ambulation/Gait assistance: Max assist Gait Distance (Feet): 3 Feet Assistive device: 1 person hand held assist Gait Pattern/deviations: Step-to pattern;Decreased step length - right;Decreased step length - left;Shuffle Gait velocity: decreased Gait velocity interpretation: <1.31 ft/sec, indicative of household ambulator General Gait Details: maxA for steadying with lateral stepping towards HoB, maximal multimodal cuing for stepping including increased vc for opening eyes.       Balance Overall balance assessment: Needs assistance Sitting-balance support: Feet supported Sitting balance-Leahy Scale: Zero Sitting balance - Comments: in seated EoB requires min-max A for upright, pt pushing against therapist to keep from providing support, eventually able to support himself long enought to put shoes on for stepping to HoB, less resistant with familiar task of putting shoes on                                    Cognition Arousal/Alertness: Lethargic;Suspect due to medications Behavior During Therapy: Flat affect Overall Cognitive Status: Difficult to assess                                 General Comments: Lethargic after  receiving ativan this AM, so unable to fully assess cognition. Pt's son present and reports sleeping and confusion all day, pt answers to his name 50%, and follows commands 30%         General Comments General comments (skin integrity, edema, etc.): Pt son present during session and provides information about Ativan administration last night.      Pertinent Vitals/Pain Pain Assessment: Faces Faces Pain Scale: No hurt Pain Intervention(s): Limited activity within patient's  tolerance;Monitored during session;Repositioned     PT Goals (current goals can now be found in the care plan section) Acute Rehab PT Goals Patient Stated Goal: go home and get back to routine PT Goal Formulation: With patient Time For Goal Achievement: 04/14/21 Potential to Achieve Goals: Fair Progress towards PT goals: Not progressing toward goals - comment (lethargic and confused likely due to medication)    Frequency    Min 3X/week      PT Plan Current plan remains appropriate       AM-PAC PT "6 Clicks" Mobility   Outcome Measure  Help needed turning from your back to your side while in a flat bed without using bedrails?: Total Help needed moving from lying on your back to sitting on the side of a flat bed without using bedrails?: A Lot Help needed moving to and from a bed to a chair (including a wheelchair)?: A Lot Help needed standing up from a chair using your arms (e.g., wheelchair or bedside chair)?: A Lot Help needed to walk in hospital room?: A Lot Help needed climbing 3-5 steps with a railing? : Total 6 Click Score: 10    End of Session Equipment Utilized During Treatment: Gait belt Activity Tolerance: Patient limited by fatigue;Treatment limited secondary to agitation Patient left: in bed;with call bell/phone within reach;with bed alarm set;with family/visitor present Nurse Communication: Mobility status PT Visit Diagnosis: Unsteadiness on feet (R26.81);Muscle weakness (generalized) (M62.81)     Time: 8366-2947 PT Time Calculation (min) (ACUTE ONLY): 17 min  Charges:  $Therapeutic Activity: 8-22 mins                     Deryk Bozman B. Migdalia Dk PT, DPT Acute Rehabilitation Services Pager [phone] Office [phone]    [place] 04/01/2021, 3:00 PM

## 2021-04-01 NOTE — Progress Notes (Signed)
OT Cancellation Note  Patient Details Name: Casey Lee MRN: [id number] DOB: 1928-07-31   Cancelled Treatment:    Reason Eval/Treat Not Completed: Fatigue/lethargy limiting ability to participate Checked on pt after lunch for further OOB assessment with OT. However, pt still lethargic, resting in bed and did not waken to voice. Will follow-up tomorrow as schedule permits.  Layla Maw 04/01/2021, 3:03 PM

## 2021-04-01 NOTE — Evaluation (Signed)
 Occupational Therapy Evaluation Patient Details Name: Casey Lee MRN: [id number] DOB: 11-Jan-1928 Today's Date: 04/01/2021    History of Present Illness 87yo admitted 6/28 with c/o acute confusion and AMS different from his baseline. Found to be in sepsis. PMH Afib, HOH, HLD, temporal arteritis, TIA, UTI, vertigo   Clinical Impression   PTA, pt lives with spouse and typically Independent with ADLs, IADLs and mobility without AD. 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 Pt presents now with increased confusion impacting safety during daily tasks. Pt recently received ativan after confusion/agitation overnight, so lethargic this AM and unable to get an accurate picture of current functional abilities. Pt's son present, so engaged in conversation on pt's cognition, benefits of familiar environment/routine, home modifications for safety and recommended initial 24/7 supervision for safety in returning to daily routine. Hope to follow-up again for further OOB activities pending improvements in pt lethargy.     Follow Up Recommendations  Home health OT;Supervision/Assistance - 24 hour    Equipment Recommendations  Tub/shower seat    Recommendations for Other Services       Precautions / Restrictions Precautions Precautions: Fall Restrictions Weight Bearing Restrictions: No      Mobility Bed Mobility               General bed mobility comments: deferred - lethargy    Transfers                 General transfer comment: deferred - lethargy    Balance Overall balance assessment:  (to be further assessed)                                         ADL either performed or assessed with clinical judgement   ADL Overall ADL's : Needs assistance/impaired                                       General ADL Comments: To be further assessed, given ativan this AM so unable to get accurate picture of level of assist due to lethargy. Would need  increased assist due to lethargy     Vision Patient Visual Report: No change from baseline Vision Assessment?: No apparent visual deficits     Perception     Praxis      Pertinent Vitals/Pain Pain Assessment: Faces Faces Pain Scale: No hurt Pain Intervention(s): Limited activity within patient's tolerance;Monitored during session     Hand Dominance Right   Extremity/Trunk Assessment Upper Extremity Assessment Upper Extremity Assessment: Generalized weakness   Lower Extremity Assessment Lower Extremity Assessment: Defer to PT evaluation   Cervical / Trunk Assessment Cervical / Trunk Assessment: Kyphotic   Communication Communication Communication: No difficulties   Cognition Arousal/Alertness: Lethargic;Suspect due to medications Behavior During Therapy: Flat affect Overall Cognitive Status: Difficult to assess                                 General Comments: Lethargic after receiving ativan this AM, so unable to fully assess cognition. Pt's son present and reports continued confusion when he initially awoke this morning   General Comments  Pt's son present and engaged in conversation about cognition (confusion,benefits of being in home environment), support at home, therapy follow-up and home modifications to  maximize safety (may move computer to main floor if pt wants to go downstairs, increasing fall risk)    Exercises     Shoulder Instructions      Home Living Family/patient expects to be discharged to:: Private residence Living Arrangements: Spouse/significant other Available Help at Discharge: Family Type of Home: House Home Access: Stairs to enter Technical  of Steps: 2 STE no rails Entrance Stairs-Rails: None Home Layout: Multi-level Alternate Level Stairs-Number of Steps: 11 down to office/den and 3 to main house; patient unreliable historian so some history taken from prior charting Alternate Level Stairs-Rails:  Right Bathroom Shower/Tub: Walk-in shower         Home Equipment: None   Additional Comments: lives in split level home. Pt's son is arranging for 24/7 supervision initially between family, also looking at home care agencies as needed      Prior Functioning/Environment Level of Independence: Independent        Comments: No use of AD, was driving his wife to/from dialysis        OT Problem List: Decreased strength;Impaired balance (sitting and/or standing);Decreased cognition;Decreased safety awareness      OT Treatment/Interventions: Self-care/ADL training;Therapeutic exercise;DME and/or AE instruction;Therapeutic activities;Patient/family education;Balance training    OT Goals(Current goals can be found in the care plan section) Acute Rehab OT Goals Patient Stated Goal: go home and get back to routine OT Goal Formulation: With family Time For Goal Achievement: 04/15/21 Potential to Achieve Goals: Good  OT Frequency: Min 2X/week   Barriers to D/C:            Co-evaluation              AM-PAC OT "6 Clicks" Daily Activity     Outcome Measure Help from another person eating meals?: A Little Help from another person taking care of personal grooming?: A Little Help from another person toileting, which includes using toliet, bedpan, or urinal?: A Little Help from another person bathing (including washing, rinsing, drying)?: A Little Help from another person to put on and taking off regular upper body clothing?: A Little Help from another person to put on and taking off regular lower body clothing?: A Little 6 Click Score: 18   End of Session Nurse Communication: Mobility status  Activity Tolerance: Patient limited by lethargy;Patient limited by fatigue Patient left: in bed;with call bell/phone within reach;with bed alarm set;with family/visitor present  OT Visit Diagnosis: Unsteadiness on feet (R26.81);Other abnormalities of gait and mobility (R26.89);Other  symptoms and signs involving cognitive function                Time: 6433-2951 OT Time Calculation (min): 14 min Charges:  OT General Charges $OT Visit: 1 Visit OT Evaluation $OT Eval Low Complexity: 1 Low  Malachy Chamber, OTR/L Acute Rehab Services Office: [phone]   Layla Maw 04/01/2021, 9:35 AM

## 2021-04-01 NOTE — Progress Notes (Addendum)
Patient pulling lines off, tele monitor leads out and pulling IV lines off the iv . Restarted iv infusion with a new bag. Changed linens and put on hospital gown for patient. as patient got wet from iv fluid. Back in bed with new blankets. Patient still confused with son arriving. MD was notified earlier of patient pulling leads off and with verbal orders.

## 2021-04-01 NOTE — Progress Notes (Signed)
Pt woke up from bed agitated and said he had to leave. Pt started to get dressed and refused any redirection. Pt wandered into the hallway and tried to go into other patients rooms but was redirected. Pt was informed his son was on the way to see him but continued to refuse redirection or going to his room. Dr. Nevada Crane and security were notified. Security was able to escort pt. back to his room and Ativan was ordered and given. Pt son arrived at the bedside and attempted to convince his father to stay in the hospital.Pt is resting in bed, son is at the bedside. Will continue to monitor.

## 2021-04-02 ENCOUNTER — Inpatient Hospital Stay (HOSPITAL_COMMUNITY): Payer: Medicare Other

## 2021-04-02 LAB — COMPREHENSIVE METABOLIC PANEL
ALT: 24 U/L (ref 0–44)
AST: 30 U/L (ref 15–41)
Albumin: 2.9 g/dL — ABNORMAL LOW (ref 3.5–5.0)
Alkaline Phosphatase: 56 U/L (ref 38–126)
Anion gap: 11 (ref 5–15)
BUN: 31 mg/dL — ABNORMAL HIGH (ref 8–23)
CO2: 22 mmol/L (ref 22–32)
Calcium: 8.8 mg/dL — ABNORMAL LOW (ref 8.9–10.3)
Chloride: 106 mmol/L (ref 98–111)
Creatinine, Ser: 1.68 mg/dL — ABNORMAL HIGH (ref 0.61–1.24)
GFR, Estimated: 38 mL/min — ABNORMAL LOW (ref >60)
Glucose, Bld: 127 mg/dL — ABNORMAL HIGH (ref 70–99)
Potassium: 3.3 mmol/L — ABNORMAL LOW (ref 3.5–5.1)
Sodium: 139 mmol/L (ref 135–145)
Total Bilirubin: 1.3 mg/dL — ABNORMAL HIGH (ref 0.3–1.2)
Total Protein: 5.4 g/dL — ABNORMAL LOW (ref 6.5–8.1)

## 2021-04-02 LAB — CBC WITH DIFFERENTIAL/PLATELET
Abs Immature Granulocytes: 0.1 10*3/uL — ABNORMAL HIGH (ref 0.00–0.07)
Basophils Absolute: 0 10*3/uL (ref 0.0–0.1)
Basophils Relative: 0 %
Eosinophils Absolute: 0 10*3/uL (ref 0.0–0.5)
Eosinophils Relative: 0 %
HCT: 40.7 % (ref 39.0–52.0)
Hemoglobin: 13.4 g/dL (ref 13.0–17.0)
Immature Granulocytes: 1 %
Lymphocytes Relative: 9 %
Lymphs Abs: 1.3 10*3/uL (ref 0.7–4.0)
MCH: 31 pg (ref 26.0–34.0)
MCHC: 32.9 g/dL (ref 30.0–36.0)
MCV: 94.2 fL (ref 80.0–100.0)
Monocytes Absolute: 1 10*3/uL (ref 0.1–1.0)
Monocytes Relative: 7 %
Neutro Abs: 11.6 10*3/uL — ABNORMAL HIGH (ref 1.7–7.7)
Neutrophils Relative %: 83 %
Platelets: 146 10*3/uL — ABNORMAL LOW (ref 150–400)
RBC: 4.32 MIL/uL (ref 4.22–5.81)
RDW: 13.4 % (ref 11.5–15.5)
WBC: 14.1 10*3/uL — ABNORMAL HIGH (ref 4.0–10.5)
nRBC: 0 % (ref 0.0–0.2)

## 2021-04-02 LAB — D-DIMER, QUANTITATIVE: D-Dimer, Quant: 0.58 ug/mL-FEU — ABNORMAL HIGH (ref 0.00–0.50)

## 2021-04-02 LAB — URINE CULTURE: Culture: >=100000 — AB

## 2021-04-02 LAB — C-REACTIVE PROTEIN: CRP: 13.6 mg/dL — ABNORMAL HIGH (ref <1.0)

## 2021-04-02 LAB — BRAIN NATRIURETIC PEPTIDE: B Natriuretic Peptide: 427.4 pg/mL — ABNORMAL HIGH (ref 0.0–100.0)

## 2021-04-02 LAB — SARS CORONAVIRUS 2 (TAT 6-24 HRS): SARS Coronavirus 2: POSITIVE — AB

## 2021-04-02 LAB — MAGNESIUM: Magnesium: 1.7 mg/dL (ref 1.7–2.4)

## 2021-04-02 LAB — PROCALCITONIN: Procalcitonin: 0.29 ng/mL

## 2021-04-02 MED ORDER — METHYLPREDNISOLONE SODIUM SUCC 125 MG IJ SOLR: 125.0000 mg | Freq: Once | INTRAMUSCULAR | Status: AC | PRN

## 2021-04-02 MED ORDER — ALBUTEROL SULFATE HFA 108 (90 BASE) MCG/ACT IN AERS
2.0000 | INHALATION_SPRAY | Freq: Once | RESPIRATORY_TRACT | Status: AC | PRN
  Filled 2021-04-02: qty 6.7

## 2021-04-02 MED ORDER — BEBTELOVIMAB 175 MG/2 ML IV (EUA)
175.0000 mg | Freq: Once | INTRAMUSCULAR | Status: AC
  Administered 2021-04-02: 175 mg via INTRAVENOUS
  Filled 2021-04-02: qty 2

## 2021-04-02 MED ORDER — LACTATED RINGERS IV SOLN: INTRAVENOUS | Status: AC

## 2021-04-02 MED ORDER — DIPHENHYDRAMINE HCL 50 MG/ML IJ SOLN: 50.0000 mg | Freq: Once | INTRAMUSCULAR | Status: AC | PRN

## 2021-04-02 MED ORDER — SODIUM CHLORIDE 0.9 % IV SOLN: INTRAVENOUS | Status: AC | PRN

## 2021-04-02 MED ORDER — DEXAMETHASONE SODIUM PHOSPHATE 10 MG/ML IJ SOLN
6.0000 mg | INTRAMUSCULAR | Status: DC
  Administered 2021-04-02 – 2021-04-04 (×3): 6 mg via INTRAVENOUS
  Filled 2021-04-02 (×3): qty 0.6

## 2021-04-02 MED ORDER — EPINEPHRINE 0.3 MG/0.3ML IJ SOAJ
0.3000 mg | Freq: Once | INTRAMUSCULAR | Status: AC | PRN
  Filled 2021-04-02: qty 0.6

## 2021-04-02 MED ORDER — POTASSIUM CHLORIDE CRYS ER 20 MEQ PO TBCR
40.0000 meq | EXTENDED_RELEASE_TABLET | Freq: Once | ORAL | Status: AC
  Administered 2021-04-02: 40 meq via ORAL
  Filled 2021-04-02: qty 2

## 2021-04-02 MED ORDER — FAMOTIDINE IN NACL 20-0.9 MG/50ML-% IV SOLN
20.0000 mg | Freq: Once | INTRAVENOUS | Status: AC | PRN
  Filled 2021-04-02: qty 50

## 2021-04-02 NOTE — Care Management Important Message (Signed)
Important Message  Patient Details  Name: Casey Lee MRN: [id number] Date of Birth: 20-Dec-1927   Medicare Important Message Given:  Yes - Important Message mailed due to current National Emergency  Verbal consent obtained due to current National Emergency  Relationship to patient: Self Contact Name: Mario Call Date: 04/02/21  Time: 1134 Phone: [phone] Outcome: No Answer/Busy Important Message mailed to: Patient address on file    [name] 04/02/2021, 11:34 AM

## 2021-04-02 NOTE — Progress Notes (Signed)
Vitals pre monoclonal antibody injection given Bebtelovimab   04/02/21 1359  Vitals  Temp 97.6 F (36.4 C)  Temp Source Oral  BP 104/76  MAP (mmHg) 86  BP Location Left Arm  BP Method Automatic  Patient Position (if appropriate) Lying  Pulse Rate (!) 110  Pulse Rate Source Monitor  Resp 16  MEWS COLOR  MEWS Score Color Green  Oxygen Therapy  SpO2 96 %  O2 Device Room Air  MEWS Score  MEWS Temp 0  MEWS Systolic 0  MEWS Pulse 1  MEWS RR 0  MEWS LOC 0  MEWS Score 1   Vitals post monoclonal antibody injection given Bebtelovimab. Bp cuff repositioned and BP improved to 103/79 (89)   04/02/21 1415  Vitals  Temp (!) 97.4 F (36.3 C)  Temp Source Oral  BP (!) 89/68  MAP (mmHg) 75  BP Location Left Arm  BP Method Automatic  Patient Position (if appropriate) Lying  Pulse Rate (!) 110  Pulse Rate Source Monitor  Resp 18  Level of Consciousness  Level of Consciousness Alert  MEWS COLOR  MEWS Score Color Yellow  Oxygen Therapy  SpO2 93 %  O2 Device Room Air  MEWS Score  MEWS Temp 0  MEWS Systolic 1  MEWS Pulse 1  MEWS RR 0  MEWS LOC 0  MEWS Score 2    Pt has no s/s of hypersensitivity reaction. Pt resting with ease and breathing WDL on RA. Pt reports no pain or distress. Pt is lethargic which is NOT a change from previous assessment prior to monoclonal antibody injection. Will continue to monitor. Pt does have irregular rhythm (same as this am) and has history of A-fib. Will update MD on pt current HR slightly more elevated than this am since PT and mobility occurred.

## 2021-04-02 NOTE — Progress Notes (Addendum)
PROGRESS NOTE        PATIENT DETAILS Name: Casey Lee Age: 87 y.o. Sex: male Date of Birth: 1928-02-26 Admit Date: 03/30/2021 Admitting Physician Karmen Bongo, MD WYO:VZCHY, [name], MD  Brief Narrative: Patient is a 87 y.o. male persistent atrial fibrillation on Eliquis, HLD-we will presented with fever, slight confusion-he was found to have sepsis physiology and started on broad-spectrum antimicrobial therapy.  He does acknowledge dysuria for 3 days prior to this hospitalization.  He has a long history of urinary urgency/hesitancy and poor stream.  Significant events: 6/28>> admit for sepsis-likely due to UTI.  Significant studies: 6/28>> CXR: No pneumonia 6/29>> CXR: Possible mild CHF.  Antimicrobial therapy: Cefepime: 6/28 x 1 Vancomycin: 6/2 x1 Flagyl: 6/28>> 6/29 Ceftriaxone: 6/29>>  Microbiology data: 6/28>> COVID/influenza PCR: Negative 6/28>> blood culture: No growth 6/28>> urine culture: Pending  Procedures : None  Consults: None  DVT Prophylaxis : apixaban (ELIQUIS) tablet 2.5 mg Start: 03/30/21 2200 apixaban (ELIQUIS) tablet 2.5 mg    Subjective:  Patient in bed appears to be in no distress but somewhat confused, he denies any headache chest or abdominal pain.  Denies any shortness of breath.  Assessment/Plan:  Sepsis due to complicated UTI: Due to pansensitive Citrobacter, improving on Rocephin, continue Rocephin and gentle IV fluids, sepsis pathophysiology has resolved.  Acute Covid Infection - CXR ? +ve, CRP high could be due to UTI however cannot rule out Covid injury, will start on moderate dose IV steroids along with monoclonal antibody infusion.  Due to his Eliquis he cannot take Paxilovid, DW son.  Wife who is a dialysis patient recently diagnosed with COVID-19 infection as well.  Severe delirium.  Due to combination of #1 and 2 above,  Haldol as needed, avoid Ativan.    Probable BPH: Acknowledges symptoms of  prostatism for the past few years-starting Flomax-once infection issues overnight-we will need outpatient follow-up with urology.  Monitor with frequent bladder scans while inpatient-however does not have a distended bladder on my exam.  Random bladder scan showed 100 cc of urine.  CKD stage IIIb: Creatinine close to baseline.  Follow periodically.  Persistent atrial fibrillation: Continue Eliquis (dose adjusted by pharmacy to 2.5 mg twice daily)-hold Toprol for now.  Mild exertional dyspnea reported prior to hospitalization: Question if this is due to COVID-19 infection prior to hospitalization.  See plan above   Diet: Diet Order             Diet regular Room service appropriate? Yes; Fluid consistency: Thin  Diet effective now                    Code Status: Full code   Family Communication: Son at bedside - 04/01/21, son in detail on 04/02/2021   Disposition Plan: Home vs Home with Home health vs SNF when ready for discharge Status is: Inpatient  Remains inpatient appropriate because:Inpatient level of care appropriate due to severity of illness  Dispo: The patient is from: Home              Anticipated d/c is to: Home              Patient currently is not medically stable to d/c.   Difficult to place patient No   Barriers to Discharge: Resolving sepsis physiology-on IV antibiotics-awaiting culture data  Antimicrobial agents: Anti-infectives (From admission, onward)  Start     Dose/Rate Route Frequency Ordered Stop   03/31/21 1700  vancomycin (VANCOREADY) IVPB 750 mg/150 mL  Status:  Discontinued        750 mg 150 mL/hr over 60 Minutes Intravenous Every 24 hours 03/30/21 1719 03/31/21 1238   03/31/21 1330  cefTRIAXone (ROCEPHIN) 1 g in sodium chloride 0.9 % 100 mL IVPB        1 g 200 mL/hr over 30 Minutes Intravenous Every 24 hours 03/31/21 1238     03/31/21 0000  metroNIDAZOLE (FLAGYL) IVPB 500 mg  Status:  Discontinued        500 mg 100 mL/hr over 60 Minutes  Intravenous Every 8 hours 03/30/21 1717 03/31/21 1238   03/30/21 1800  ceFEPIme (MAXIPIME) 2 g in sodium chloride 0.9 % 100 mL IVPB  Status:  Discontinued        2 g 200 mL/hr over 30 Minutes Intravenous Every 24 hours 03/30/21 1717 03/31/21 1238   03/30/21 1730  vancomycin (VANCOREADY) IVPB 1500 mg/300 mL        1,500 mg 150 mL/hr over 120 Minutes Intravenous  Once 03/30/21 1608 03/30/21 2031   03/30/21 1615  metroNIDAZOLE (FLAGYL) IVPB 500 mg        500 mg 100 mL/hr over 60 Minutes Intravenous  Once 03/30/21 1608 03/30/21 1732   03/30/21 1400  cefTRIAXone (ROCEPHIN) 1 g in sodium chloride 0.9 % 100 mL IVPB        1 g 200 mL/hr over 30 Minutes Intravenous  Once 03/30/21 1358 03/30/21 1523        Time spent: 35 minutes-Greater than 50% of this time was spent in counseling, explanation of diagnosis, planning of further management, and coordination of care.  MEDICATIONS: Scheduled Meds:  apixaban  2.5 mg Oral BID   bacitracin   Topical BID   dexamethasone (DECADRON) injection  6 mg Intravenous Q24H   loratadine  10 mg Oral Daily   melatonin  5 mg Oral Once   sodium chloride flush  3 mL Intravenous Q12H   tamsulosin  0.4 mg Oral QPC supper   Continuous Infusions:  cefTRIAXone (ROCEPHIN)  IV 1 g (04/02/21 0755)   lactated ringers     PRN Meds:.acetaminophen **OR** [DISCONTINUED] acetaminophen, haloperidol lactate, [DISCONTINUED] ondansetron **OR** ondansetron (ZOFRAN) IV   PHYSICAL EXAM: Vital signs: Vitals:   04/01/21 2121 04/02/21 0344 04/02/21 0346 04/02/21 1038  BP: 116/73 110/65  96/63  Pulse: (!) 106 (!) 112 91 72  Resp: 18 20  16   Temp: 97.8 F (36.6 C) (!) 97.4 F (36.3 C) 100.1 F (37.8 C) 98.3 F (36.8 C)  TempSrc: Oral Oral Oral   SpO2: 92% 91% 90% 95%  Weight:  67.1 kg    Height:       Filed Weights   03/31/21 0529 03/31/21 2045 04/02/21 0344  Weight: 67.1 kg 67.1 kg 67.1 kg   Body mass index is 21.85 kg/m.   Gen Exam:  Awake but confused,  moving all 4 extremities Protivin.AT,PERRAL Supple Neck,No JVD, No cervical lymphadenopathy appriciated.  Symmetrical Chest wall movement, Good air movement bilaterally, CTAB RRR,No Gallops, Rubs or new Murmurs, No Parasternal Heave +ve B.Sounds, Abd Soft, No tenderness, No organomegaly appriciated, No rebound - guarding or rigidity. No Cyanosis, Clubbing or edema, No new Rash or bruise   I have personally reviewed following labs and imaging studies  LABORATORY DATA: CBC: Recent Labs  Lab 03/30/21 1415 03/31/21 0426 04/02/21 0443  WBC 14.9* 17.2* 14.1*  NEUTROABS 13.0*  --  11.6*  HGB 13.7 13.1 13.4  HCT 43.1 39.9 40.7  MCV 96.2 94.8 94.2  PLT 148* 135* 146*    Basic Metabolic Panel: Recent Labs  Lab 03/30/21 1415 03/31/21 0426 04/01/21 0748 04/02/21 0443  NA 136 136  --  139  K 4.0 4.4  --  3.3*  CL 100 107  --  106  CO2 25 20*  --  22  GLUCOSE 164* 169*  --  127*  BUN 25* 29*  --  31*  CREATININE 1.75* 1.60*  --  1.68*  CALCIUM 9.7 9.3  --  8.8*  MG  --   --  1.8 1.7    GFR: Estimated Creatinine Clearance: 27.7 mL/min (A) (by C-G formula based on SCr of 1.68 mg/dL (H)).  Liver Function Tests: Recent Labs  Lab 03/30/21 1415 04/02/21 0443  AST 26 30  ALT 22 24  ALKPHOS 48 56  BILITOT 1.0 1.3*  PROT 6.3* 5.4*  ALBUMIN 3.6 2.9*   No results for input(s): LIPASE, AMYLASE in the last 168 hours. No results for input(s): AMMONIA in the last 168 hours.  Coagulation Profile: Recent Labs  Lab 03/30/21 1415  INR 1.6*    Cardiac Enzymes: No results for input(s): CKTOTAL, CKMB, CKMBINDEX, TROPONINI in the last 168 hours.  BNP (last 3 results) No results for input(s): PROBNP in the last 8760 hours.  Lipid Profile: No results for input(s): CHOL, HDL, LDLCALC, TRIG, CHOLHDL, LDLDIRECT in the last 72 hours.  Thyroid Function Tests: No results for input(s): TSH, T4TOTAL, FREET4, T3FREE, THYROIDAB in the last 72 hours.  Anemia Panel: No results for input(s):  VITAMINB12, FOLATE, FERRITIN, TIBC, IRON, RETICCTPCT in the last 72 hours.  Urine analysis:    Component Value Date/Time   COLORURINE YELLOW 03/30/2021 1435   APPEARANCEUR HAZY (A) 03/30/2021 1435   LABSPEC 1.017 03/30/2021 1435   PHURINE 5.0 03/30/2021 1435   GLUCOSEU NEGATIVE 03/30/2021 1435   HGBUR SMALL (A) 03/30/2021 1435   BILIRUBINUR NEGATIVE 03/30/2021 1435   KETONESUR NEGATIVE 03/30/2021 1435   PROTEINUR NEGATIVE 03/30/2021 1435   NITRITE NEGATIVE 03/30/2021 1435   LEUKOCYTESUR SMALL (A) 03/30/2021 1435    Sepsis Labs: Lactic Acid, Venous    Component Value Date/Time   LATICACIDVEN 1.5 03/30/2021 1631    MICROBIOLOGY: Recent Results (from the past 240 hour(s))  Blood Culture (routine x 2)     Status: None (Preliminary result)   Collection Time: 03/30/21  2:19 PM   Specimen: BLOOD  Result Value Ref Range Status   Specimen Description BLOOD LEFT ANTECUBITAL  Final   Special Requests   Final    BOTTLES DRAWN AEROBIC AND ANAEROBIC Blood Culture adequate volume   Culture   Final    NO GROWTH 3 DAYS Performed at [facility], [place] [address] [place], [place] [address]    Report Status PENDING  Incomplete  Blood Culture (routine x 2)     Status: None (Preliminary result)   Collection Time: 03/30/21  2:19 PM   Specimen: BLOOD  Result Value Ref Range Status   Specimen Description BLOOD RIGHT ANTECUBITAL  Final   Special Requests   Final    BOTTLES DRAWN AEROBIC AND ANAEROBIC Blood Culture adequate volume   Culture   Final    NO GROWTH 3 DAYS Performed at [facility], [place] [address] [place], [place] [address]    Report Status PENDING  Incomplete  Resp Panel by RT-PCR (Flu  A&B, Covid) Nasopharyngeal Swab     Status: None   Collection Time: 03/30/21  2:52 PM   Specimen: Nasopharyngeal Swab; Nasopharyngeal(NP) swabs in vial transport medium  Result Value Ref Range Status   SARS Coronavirus 2 by RT PCR NEGATIVE NEGATIVE Final    Comment:  (NOTE) SARS-CoV-2 target nucleic acids are NOT DETECTED.  The SARS-CoV-2 RNA is generally detectable in upper respiratory specimens during the acute phase of infection. The lowest concentration of SARS-CoV-2 viral copies this assay can detect is 138 copies/mL. A negative result does not preclude SARS-Cov-2 infection and should not be used as the sole basis for treatment or other patient management decisions. A negative result may occur with  improper specimen collection/handling, submission of specimen other than nasopharyngeal swab, presence of viral mutation(s) within the areas targeted by this assay, and inadequate number of viral copies(<138 copies/mL). A negative result must be combined with clinical observations, patient history, and epidemiological information. The expected result is Negative.  Fact Sheet for Patients:  EntrepreneurPulse.com.au  Fact Sheet for Healthcare Providers:  IncredibleEmployment.be  This test is no t yet approved or cleared by the [place] FDA and  has been authorized for detection and/or diagnosis of SARS-CoV-2 by FDA under an Emergency Use Authorization (EUA). This EUA will remain  in effect (meaning this test can be used) for the duration of the COVID-19 declaration under Section 564(b)(1) of the Act, 21 U.S.C.section 360bbb-3(b)(1), unless the authorization is terminated  or revoked sooner.       Influenza A by PCR NEGATIVE NEGATIVE Final   Influenza B by PCR NEGATIVE NEGATIVE Final    Comment: (NOTE) The Xpert Xpress SARS-CoV-2/FLU/RSV plus assay is intended as an aid in the diagnosis of influenza from Nasopharyngeal swab specimens and should not be used as a sole basis for treatment. Nasal washings and aspirates are unacceptable for Xpert Xpress SARS-CoV-2/FLU/RSV testing.  Fact Sheet for Patients: EntrepreneurPulse.com.au  Fact Sheet for Healthcare  Providers: IncredibleEmployment.be  This test is not yet approved or cleared by the [place] FDA and has been authorized for detection and/or diagnosis of SARS-CoV-2 by FDA under an Emergency Use Authorization (EUA). This EUA will remain in effect (meaning this test can be used) for the duration of the COVID-19 declaration under Section 564(b)(1) of the Act, 21 U.S.C. section 360bbb-3(b)(1), unless the authorization is terminated or revoked.  Performed at [facility], [place] [address] [place], [place] [address]   Urine culture     Status: Abnormal   Collection Time: 03/30/21  5:32 PM   Specimen: In/Out Cath Urine  Result Value Ref Range Status   Specimen Description IN/OUT CATH URINE  Final   Special Requests   Final    NONE Performed at [facility], [place] [address] [place], [place] [address]    Culture >=100,000 COLONIES/mL CITROBACTER KOSERI (A)  Final   Report Status 04/02/2021 FINAL  Final   Organism ID, Bacteria CITROBACTER KOSERI (A)  Final      Susceptibility   Citrobacter koseri - MIC*    CEFAZOLIN <=4 SENSITIVE Sensitive     CEFEPIME <=0.12 SENSITIVE Sensitive     CEFTRIAXONE <=0.25 SENSITIVE Sensitive     CIPROFLOXACIN <=0.25 SENSITIVE Sensitive     GENTAMICIN <=1 SENSITIVE Sensitive     IMIPENEM <=0.25 SENSITIVE Sensitive     NITROFURANTOIN <=16 SENSITIVE Sensitive     TRIMETH/SULFA <=20 SENSITIVE Sensitive     PIP/TAZO <=4 SENSITIVE Sensitive     * >=  100,000 COLONIES/mL CITROBACTER KOSERI  SARS CORONAVIRUS 2 (TAT 6-24 HRS) Nasopharyngeal Nasopharyngeal Swab     Status: Abnormal   Collection Time: 04/02/21  4:27 AM   Specimen: Nasopharyngeal Swab  Result Value Ref Range Status   SARS Coronavirus 2 POSITIVE (A) NEGATIVE Final    Comment: (NOTE) SARS-CoV-2 target nucleic acids are DETECTED.  The SARS-CoV-2 RNA is generally detectable in upper and lower respiratory specimens during the acute phase of infection.  Positive results are indicative of the presence of SARS-CoV-2 RNA. Clinical correlation with patient history and other diagnostic information is  necessary to determine patient infection status. Positive results do not rule out bacterial infection or co-infection with other viruses.  The expected result is Negative.  Fact Sheet for Patients: SugarRoll.be  Fact Sheet for Healthcare Providers: https://www.woods-mathews.com/  This test is not yet approved or cleared by the [place] FDA and  has been authorized for detection and/or diagnosis of SARS-CoV-2 by FDA under an Emergency Use Authorization (EUA). This EUA will remain  in effect (meaning this test can be used) for the duration of the COVID-19 declaration under Section 564(b)(1) of the Act, 21 U. S.C. section 360bbb-3(b)(1), unless the authorization is terminated or revoked sooner.   Performed at [facility], [place] [address] [place], [place] [address]     RADIOLOGY STUDIES/RESULTS:  DG Chest Port 1 View  Result Date: 04/02/2021 CLINICAL DATA:  Shortness of breath. EXAM: PORTABLE CHEST 1 VIEW COMPARISON:  03/31/2021 prior studies FINDINGS: Increased bilateral hazy opacities within both LOWER lungs noted. Cardiomegaly and mild pulmonary vascular congestion noted. There is no evidence of pneumothorax or acute bony abnormality. No large pleural effusions are present. IMPRESSION: Increased bilateral LOWER lung hazy opacities which may represent airspace disease/pneumonia versus edema. Electronically Signed   By: Margarette Canada M.D.   On: 04/02/2021 09:36     LOS: 3 days  Signature  Lala Lund M.D on 04/02/2021 at 12:19 PM   -  To page go to [email or web address]

## 2021-04-02 NOTE — Progress Notes (Signed)
Physical Therapy Treatment Patient Details Name: Casey Lee MRN: [id number] DOB: 1928/05/18 Today's Date: 04/02/2021    History of Present Illness 87yo admitted 6/28 with c/o acute confusion and AMS different from his baseline. Found to be in sepsis. PMH Afib, HOH, HLD, temporal arteritis, TIA, UTI, vertigo. COVID+    PT Comments    Pt demonstrates ability to perform bed mobility and transfers without requiring physical assistance to perform. Pt tolerates ambulation of household distances with and without an AD, demonstrating moderate instability and multiple LOB with mod A to correct. Pt demonstrating cognitive deficits, failing to remember he has been diagnoses with COVID. Pt requires assistance for stair negotiation as he experiences instability. Pt denies losing balance during session and feels as though his mobility is fine. Pt denies sustaining any falls or losing balance PTA. Pt is currently at a risk for falls and raises concerns for safety. Pt demonstrates generalized weakness and deficits in cognition, balance, functional mobility, and gait and will benefit from continued acute PT to improve functional mobility and safety. SPT recommends SNF placement to meet the physical assistance requirements the pt currently needs and to assist with return to prior level.   Follow Up Recommendations  Supervision/Assistance - 24 hour;SNF     Equipment Recommendations       Recommendations for Other Services       Precautions / Restrictions Precautions Precautions: Fall Restrictions Weight Bearing Restrictions: No    Mobility  Bed Mobility Overal bed mobility: Needs Assistance Bed Mobility: Supine to Sit     Supine to sit: HOB elevated;Min guard Sit to supine: Min guard   General bed mobility comments: min G for safety and HOB elevated    Transfers Overall transfer level: Needs assistance Equipment used: Rolling walker (2 wheeled);None Transfers: Sit to/from Stand Sit to  Stand: Min guard (3x) Stand pivot transfers: Min assist       General transfer comment: Pt min G for sit>stand, initially with RW and then without device.  Ambulation/Gait Ambulation/Gait assistance: Min assist;Mod assist Gait Distance (Feet): 100 Feet Assistive device: 1 person hand held assist;None;Rolling walker (2 wheeled) Gait Pattern/deviations: Step-to pattern;Decreased step length - right;Decreased step length - left;Shuffle;Step-through pattern Gait velocity: decreased Gait velocity interpretation: 1.31 - 2.62 ft/sec, indicative of limited community ambulator General Gait Details: pt ambulates with instability without use of AD. Gait trial with RW and pt requiring multiple cues for safety and device management with a propsensity of lifting walker from floor and with poor obstacle management. Pt loses balance multiple times during gait with one overt LOB and mod A to correct. Pt reports his walking is fine and denies losing balance during session.   Stairs Stairs: Yes Stairs assistance: Mod assist Stair Management: No rails;Step to pattern (6 inch step in room due to covid precautions) Number of Stairs: 4 General stair comments: unstead with steps requiring mod A and HHA for stability and to maintain balance as pt loses balance posteriorly.   Wheelchair Mobility    Modified Rankin (Stroke Patients Only)       Balance Overall balance assessment: Needs assistance Sitting-balance support: Feet supported Sitting balance-Leahy Scale: Fair Sitting balance - Comments: pt able to don socks without UE support sitting at EOB   Standing balance support: During functional activity;Single extremity supported;No upper extremity supported Standing balance-Leahy Scale: Poor Standing balance comment: pt loses balance multiple times throughout session, requring external support  Cognition Arousal/Alertness: Awake/alert Behavior During Therapy:  Flat affect Overall Cognitive Status: Impaired/Different from baseline Area of Impairment: Attention;Memory;Following commands;Safety/judgement;Awareness;Problem solving                   Current Attention Level: Selective Memory: Decreased short-term memory;Decreased recall of precautions Following Commands: Follows one step commands inconsistently;Follows one step commands with increased time Safety/Judgement: Decreased awareness of safety;Decreased awareness of deficits Awareness: Intellectual Problem Solving: Slow processing;Difficulty sequencing;Requires verbal cues;Requires tactile cues General Comments: Pt asked if he has his shoes in his hospital room, with pt stating they are not here. When SPT later notices and informs pt his shoes are in the room, pt states he knows. Pt asking about his wife and if she has COVID. Pt fails to remember being told he is COVID + by nurse earlier in the day.      Exercises      General Comments General comments (skin integrity, edema, etc.): Pt unaware of his COVID diagnosis despite being informed by nurse earlier in the day. When pt debriefed on his gait and instability/LOB, pt denies losing his balance and reports he feels his walking is fine.      Pertinent Vitals/Pain Pain Assessment: No/denies pain Faces Pain Scale: No hurt    Home Living                      Prior Function            PT Goals (current goals can now be found in the care plan section) Acute Rehab PT Goals Patient Stated Goal: go home Progress towards PT goals: Progressing toward goals    Frequency    Min 3X/week      PT Plan Current plan remains appropriate    Co-evaluation              AM-PAC PT "6 Clicks" Mobility   Outcome Measure  Help needed turning from your back to your side while in a flat bed without using bedrails?: A Little Help needed moving from lying on your back to sitting on the side of a flat bed without using  bedrails?: A Little Help needed moving to and from a bed to a chair (including a wheelchair)?: A Little Help needed standing up from a chair using your arms (e.g., wheelchair or bedside chair)?: A Little Help needed to walk in hospital room?: A Lot Help needed climbing 3-5 steps with a railing? : A Lot 6 Click Score: 16    End of Session Equipment Utilized During Treatment: Gait belt Activity Tolerance: Patient limited by fatigue Patient left: in chair;with call bell/phone within reach;with nursing/sitter in room;with chair alarm set Nurse Communication: Mobility status PT Visit Diagnosis: Unsteadiness on feet (R26.81);Other abnormalities of gait and mobility (R26.89);Muscle weakness (generalized) (M62.81)     Time: 1453-1540 PT Time Calculation (min) (ACUTE ONLY): 47 min  Charges:  $Gait Training: 23-37 mins $Therapeutic Activity: 8-22 mins                     Acute Rehab  Pager: [phone]    [employer] 04/02/2021, 4:54 PM

## 2021-04-02 NOTE — Progress Notes (Signed)
Son called earlier in the night stating that the pt's wife suddenly got sick and tested positive for covid, Pt was her caregiver and last had contact the day of admission 03/30/21. During rounding pt was noted to have labored breathing, looked flushed, temp was 100.1, bp 110/65, HR 112, 02 90% on RA RR26.  On cal MD notified and Verbal phone order was given to obtain Covid Test. Swap was sent to the lab.

## 2021-04-02 NOTE — Progress Notes (Signed)
Pt daughter in law called for updated. She is not listed as a contact. Unable to give her information at this time. Called and spoke with son and gave him updated. He did not give permission at this time to add dtr in law to contact list. Son is updated, he reports that his Mother is currently being pick up via EMS as she has been dx with COVID as well. He hopes to visit his father today.

## 2021-04-02 NOTE — Progress Notes (Signed)
Occupational Therapy Treatment Patient Details Name: Casey Lee MRN: [id number] DOB: [birth year] Today's Date: 04/02/2021    History of present illness 87yo admitted 6/28 with c/o acute confusion and AMS different from his baseline. Found to be in sepsis. PMH Afib, HOH, HLD, temporal arteritis, TIA, UTI, vertigo. COVID+   OT comments  Treatment session with focus on orientation, activity tolerance, and standing balance.  Pt received supine in bed asleep but easily aroused.  Pt oriented to time and place with cue for name of hospital.  Pt completed bed mobility with min guard and min-min guard for sit >stand and min assist for stand pivot to simulate toilet transfers.  Pt did stand ~2 mins with min guard to close supervision while voiding urine.  Pt reports some lightheadedness while in standing, O2 95% and HR 117 on RA.  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     Follow Up Recommendations  Home health OT;Supervision/Assistance - 24 hour    Equipment Recommendations  Tub/shower seat    Recommendations for Other Services      Precautions / Restrictions Precautions Precautions: Fall Restrictions Weight Bearing Restrictions: No       Mobility Bed Mobility Overal bed mobility: Needs Assistance Bed Mobility: Supine to Sit;Sit to Supine     Supine to sit: Min guard Sit to supine: Min guard        Transfers Overall transfer level: Needs assistance   Transfers: Sit to/from [facility] Sit to Stand: Min assist Stand pivot transfers: Min assist       General transfer comment: Min - min guard sit > stand and min assist stand pivot to chair to simulate toilet transfers to [facility].  Pt requires Min guard due to reports of lightheadedness in standing    Balance Overall balance assessment: Needs assistance Sitting-balance support: Feet supported Sitting  balance-Leahy Scale: Fair Sitting balance - Comments: maintaining sitting balance EOB with min guard   Standing balance support: No upper extremity supported;During functional activity Standing balance-Leahy Scale: Fair Standing balance comment: standing for toileting needs                           ADL either performed or assessed with clinical judgement   ADL Overall ADL's : Needs assistance/impaired     Grooming: Wash/dry face;Sitting;Set up                   Toilet Transfer: Stand-pivot;Minimal assistance;BSC   Toileting- Clothing Manipulation and Hygiene: Min guard Toileting - Clothing Manipulation Details (indicate cue type and reason): Pt stood to void, Min guard for standing balance due to reports of lightheadedness     Functional mobility during ADLs: Minimal assistance General ADL Comments: Min assist - Min guard due to pt reports of lightheadedness     Vision Patient Visual Report: No change from baseline Vision Assessment?: No apparent visual deficits          Cognition Arousal/Alertness: Lethargic Behavior During Therapy: Flat affect Overall Cognitive Status: Difficult to assess Area of Impairment: Attention;Memory;Following commands;Safety/judgement;Awareness;Problem solving                   Current Attention Level: Selective Memory: Decreased short-term memory Following Commands: Follows one step commands inconsistently;Follows one step commands with increased time Safety/Judgement: Decreased awareness of safety;Decreased awareness of deficits Awareness: Intellectual Problem Solving: Difficulty  sequencing;Requires verbal cues                     Pertinent Vitals/ Pain       Pain Assessment: Faces Faces Pain Scale: No hurt         Frequency  Min 2X/week        Progress Toward Goals  OT Goals(current goals can now be found in the care plan section)  Progress towards OT goals: Progressing toward goals  Acute  Rehab OT Goals Patient Stated Goal: go home OT Goal Formulation: With patient Time For Goal Achievement: 04/15/21 Potential to Achieve Goals: Good  Plan Discharge plan remains appropriate       AM-PAC OT "6 Clicks" Daily Activity     Outcome Measure   Help from another person eating meals?: A Little Help from another person taking care of personal grooming?: A Little Help from another person toileting, which includes using toliet, bedpan, or urinal?: A Little Help from another person bathing (including washing, rinsing, drying)?: A Little Help from another person to put on and taking off regular upper body clothing?: A Little Help from another person to put on and taking off regular lower body clothing?: A Little 6 Click Score: 18    End of Session Equipment Utilized During Treatment: Gait belt  OT Visit Diagnosis: Unsteadiness on feet (R26.81);Other abnormalities of gait and mobility (R26.89);Other symptoms and signs involving cognitive function   Activity Tolerance Patient limited by lethargy;Patient limited by fatigue   Patient Left in bed;with call bell/phone within reach;with bed alarm set   Nurse Communication Mobility status (oxygen status)        Time: 0277-4128 OT Time Calculation (min): 18 min  Charges: OT General Charges $OT Visit: 1 Visit OT Treatments $Self Care/Home Management : 8-22 mins    Simonne Come [phone] 04/02/2021, 1:38 PM

## 2021-04-03 DIAGNOSIS — I10 Essential (primary) hypertension: Secondary | ICD-10-CM

## 2021-04-03 DIAGNOSIS — E785 Hyperlipidemia, unspecified: Secondary | ICD-10-CM

## 2021-04-03 LAB — COMPREHENSIVE METABOLIC PANEL
ALT: 24 U/L (ref 0–44)
AST: 25 U/L (ref 15–41)
Albumin: 2.5 g/dL — ABNORMAL LOW (ref 3.5–5.0)
Alkaline Phosphatase: 52 U/L (ref 38–126)
Anion gap: 11 (ref 5–15)
BUN: 35 mg/dL — ABNORMAL HIGH (ref 8–23)
CO2: 21 mmol/L — ABNORMAL LOW (ref 22–32)
Calcium: 8.8 mg/dL — ABNORMAL LOW (ref 8.9–10.3)
Chloride: 106 mmol/L (ref 98–111)
Creatinine, Ser: 1.65 mg/dL — ABNORMAL HIGH (ref 0.61–1.24)
GFR, Estimated: 39 mL/min — ABNORMAL LOW (ref >60)
Glucose, Bld: 229 mg/dL — ABNORMAL HIGH (ref 70–99)
Potassium: 4.1 mmol/L (ref 3.5–5.1)
Sodium: 138 mmol/L (ref 135–145)
Total Bilirubin: 0.7 mg/dL (ref 0.3–1.2)
Total Protein: 5.2 g/dL — ABNORMAL LOW (ref 6.5–8.1)

## 2021-04-03 LAB — CBC WITH DIFFERENTIAL/PLATELET
Abs Immature Granulocytes: 0.06 10*3/uL (ref 0.00–0.07)
Basophils Absolute: 0 10*3/uL (ref 0.0–0.1)
Basophils Relative: 0 %
Eosinophils Absolute: 0 10*3/uL (ref 0.0–0.5)
Eosinophils Relative: 0 %
HCT: 36.5 % — ABNORMAL LOW (ref 39.0–52.0)
Hemoglobin: 12.2 g/dL — ABNORMAL LOW (ref 13.0–17.0)
Immature Granulocytes: 1 %
Lymphocytes Relative: 5 %
Lymphs Abs: 0.5 10*3/uL — ABNORMAL LOW (ref 0.7–4.0)
MCH: 31 pg (ref 26.0–34.0)
MCHC: 33.4 g/dL (ref 30.0–36.0)
MCV: 92.9 fL (ref 80.0–100.0)
Monocytes Absolute: 0.3 10*3/uL (ref 0.1–1.0)
Monocytes Relative: 4 %
Neutro Abs: 7.7 10*3/uL (ref 1.7–7.7)
Neutrophils Relative %: 90 %
Platelets: 146 10*3/uL — ABNORMAL LOW (ref 150–400)
RBC: 3.93 MIL/uL — ABNORMAL LOW (ref 4.22–5.81)
RDW: 13.5 % (ref 11.5–15.5)
WBC: 8.6 10*3/uL (ref 4.0–10.5)
nRBC: 0 % (ref 0.0–0.2)

## 2021-04-03 LAB — PROCALCITONIN: Procalcitonin: 0.24 ng/mL

## 2021-04-03 LAB — D-DIMER, QUANTITATIVE: D-Dimer, Quant: 0.59 ug/mL-FEU — ABNORMAL HIGH (ref 0.00–0.50)

## 2021-04-03 LAB — BRAIN NATRIURETIC PEPTIDE: B Natriuretic Peptide: 384.2 pg/mL — ABNORMAL HIGH (ref 0.0–100.0)

## 2021-04-03 LAB — C-REACTIVE PROTEIN: CRP: 18.3 mg/dL — ABNORMAL HIGH (ref <1.0)

## 2021-04-03 LAB — MAGNESIUM: Magnesium: 1.9 mg/dL (ref 1.7–2.4)

## 2021-04-03 MED ORDER — FUROSEMIDE 40 MG PO TABS
40.0000 mg | ORAL_TABLET | Freq: Once | ORAL | Status: AC
  Administered 2021-04-03: 40 mg via ORAL
  Filled 2021-04-03: qty 1

## 2021-04-03 MED ORDER — CEFDINIR 300 MG PO CAPS
300.0000 mg | ORAL_CAPSULE | Freq: Every day | ORAL | Status: DC
  Administered 2021-04-03 – 2021-04-04 (×2): 300 mg via ORAL
  Filled 2021-04-03 (×2): qty 1

## 2021-04-03 NOTE — Progress Notes (Signed)
PROGRESS NOTE        PATIENT DETAILS Name: Casey Lee Age: 87 y.o. Sex: male Date of Birth: 09-18-1928 Admit Date: 03/30/2021 Admitting Physician Karmen Bongo, MD LAG:TXMIW, [name], MD  Brief Narrative: Patient is a 87 y.o. male persistent atrial fibrillation on Eliquis, HLD-we will presented with fever, slight confusion-he was found to have sepsis physiology and started on broad-spectrum antimicrobial therapy.  He does acknowledge dysuria for 3 days prior to this hospitalization.  He has a long history of urinary urgency/hesitancy and poor stream.  Significant events: 6/28>> admit for sepsis-likely due to UTI. 7/1>> positive for COVID (spouse at home tested positive)  Significant studies: 6/28>> CXR: No pneumonia 6/29>> CXR: Possible mild CHF. 7/01>> CXR: Increased bilateral lower zone hazy opacities-pneumonia versus edema.  Antimicrobial therapy: Cefepime: 6/28 x 1 Vancomycin: 6/2 x1 Flagyl: 6/28>> 6/29 Ceftriaxone: 6/29>> 7/1 Cefdinir: 7/2>>  Covid 19 therapy: 7/1>>monoclonal antibody x1 Decadron: 7/1>>  Microbiology data: 6/28>> COVID/influenza PCR: Negative 6/28>> blood culture: No growth 6/28>> urine culture: Pansensitive Citrobacter.  Procedures : None  Consults: None  DVT Prophylaxis : apixaban (ELIQUIS) tablet 2.5 mg Start: 03/30/21 2200 apixaban (ELIQUIS) tablet 2.5 mg    Subjective: Completely awake and alert-does get slightly short of breath with ambulation.  On room air.  Frustrated that his breakfast was cold this morning.  Assessment/Plan: Sepsis due to complicated UTI: Sepsis physiology has resolved-stop IV Rocephin-transition to [place].  We will plan on a total of 7 days of treatment.    COVID-19 infection: Initially COVID negative-but on 7/1-his spouse tested positive for COVID-subsequently repeat COVID-19 PCR was positive on 7/1.  He is not hypoxic on room air-but he continues to complain of exertional  dyspnea-chest x-ray with bilateral infiltrates-no evidence of volume overload on exam-however BNP significantly elevated.  Unclear whether this is pneumonia or CHF.  We will give 1 dose of Lasix today-await echo-reassess on 7/3.   Received monoclonal antibody on 7/1.    Severe delirium: Resolved-completely awake and alert this morning.  Probably due to sepsis from UTI and COVID-19.  Continue Haldol as needed.  Probable BPH: Acknowledges symptoms of prostatism for the past few years-starting Flomax-once infection issues overnight-we will need outpatient follow-up with urology.  Likely the cause of UTI.  CKD stage IIIb: Creatinine close to baseline.  Follow periodically.  Persistent atrial fibrillation: Continue Eliquis (dose adjusted by pharmacy to 2.5 mg twice daily)-hold Toprol for now.  History of giant cell arteritis: Appears to be on chronic steroids-currently on Decadron.  Diet: Diet Order             Diet regular Room service appropriate? Yes; Fluid consistency: Thin  Diet effective now                    Code Status: Full code   Family Communication: Son-Bill-over the phone on 7/2.    Disposition Plan: Status is: Inpatient  Remains inpatient appropriate because:Inpatient level of care appropriate due to severity of illness  Dispo: The patient is from: Home              Anticipated d/c is to: Home              Patient currently is not medically stable to d/c.   Difficult to place patient No   Barriers to Discharge: Ambulate today-ensure stability-Case manager to talk  with family to arrange for home health needs.  If patient continues to improve-potential discharge home on 7/3.  Antimicrobial agents: Anti-infectives (From admission, onward)    Start     Dose/Rate Route Frequency Ordered Stop   04/03/21 1000  cefdinir (OMNICEF) capsule 300 mg        300 mg Oral Daily 04/03/21 0907 04/06/21 0959   03/31/21 1700  vancomycin (VANCOREADY) IVPB 750 mg/150 mL  Status:   Discontinued        750 mg 150 mL/hr over 60 Minutes Intravenous Every 24 hours 03/30/21 1719 03/31/21 1238   03/31/21 1330  cefTRIAXone (ROCEPHIN) 1 g in sodium chloride 0.9 % 100 mL IVPB  Status:  Discontinued        1 g 200 mL/hr over 30 Minutes Intravenous Every 24 hours 03/31/21 1238 04/03/21 0907   03/31/21 0000  metroNIDAZOLE (FLAGYL) IVPB 500 mg  Status:  Discontinued        500 mg 100 mL/hr over 60 Minutes Intravenous Every 8 hours 03/30/21 1717 03/31/21 1238   03/30/21 1800  ceFEPIme (MAXIPIME) 2 g in sodium chloride 0.9 % 100 mL IVPB  Status:  Discontinued        2 g 200 mL/hr over 30 Minutes Intravenous Every 24 hours 03/30/21 1717 03/31/21 1238   03/30/21 1730  vancomycin (VANCOREADY) IVPB 1500 mg/300 mL        1,500 mg 150 mL/hr over 120 Minutes Intravenous  Once 03/30/21 1608 03/30/21 2031   03/30/21 1615  metroNIDAZOLE (FLAGYL) IVPB 500 mg        500 mg 100 mL/hr over 60 Minutes Intravenous  Once 03/30/21 1608 03/30/21 1732   03/30/21 1400  cefTRIAXone (ROCEPHIN) 1 g in sodium chloride 0.9 % 100 mL IVPB        1 g 200 mL/hr over 30 Minutes Intravenous  Once 03/30/21 1358 03/30/21 1523        Time spent: 25 minutes-Greater than 50% of this time was spent in counseling, explanation of diagnosis, planning of further management, and coordination of care.  MEDICATIONS: Scheduled Meds:  apixaban  2.5 mg Oral BID   bacitracin   Topical BID   cefdinir  300 mg Oral Daily   dexamethasone (DECADRON) injection  6 mg Intravenous Q24H   loratadine  10 mg Oral Daily   melatonin  5 mg Oral Once   sodium chloride flush  3 mL Intravenous Q12H   tamsulosin  0.4 mg Oral QPC supper   Continuous Infusions:  sodium chloride     famotidine (PEPCID) IV     lactated ringers 75 mL/hr at 04/02/21 1600   PRN Meds:.sodium chloride, acetaminophen **OR** [DISCONTINUED] acetaminophen, albuterol, diphenhydrAMINE, EPINEPHrine, famotidine (PEPCID) IV, haloperidol lactate, methylPREDNISolone  (SOLU-MEDROL) injection, [DISCONTINUED] ondansetron **OR** ondansetron (ZOFRAN) IV   PHYSICAL EXAM: Vital signs: Vitals:   04/02/21 1530 04/02/21 2116 04/03/21 0500 04/03/21 0938  BP: 108/72 138/86 113/84 (!) 145/90  Pulse: (!) 110 100 (!) 108 (!) 109  Resp: 16 20 18 18   Temp: 97.6 F (36.4 C) 97.9 F (36.6 C) (!) 97.3 F (36.3 C) 98.4 F (36.9 C)  TempSrc: Oral Oral Oral   SpO2: 95% 98% 91% 96%  Weight:      Height:       Filed Weights   03/31/21 0529 03/31/21 2045 04/02/21 0344  Weight: 67.1 kg 67.1 kg 67.1 kg   Body mass index is 21.85 kg/m.   Gen Exam:  Gen Exam:Alert awake-not in any distress HEENT:atraumatic,  normocephalic Chest: B/L clear to auscultation anteriorly CVS:S1S2 regular Abdomen:soft non tender, non distended Extremities:no edema Neurology: Non focal Skin: no rash    I have personally reviewed following labs and imaging studies  LABORATORY DATA: CBC: Recent Labs  Lab 03/30/21 1415 03/31/21 0426 04/02/21 0443 04/03/21 0344  WBC 14.9* 17.2* 14.1* 8.6  NEUTROABS 13.0*  --  11.6* 7.7  HGB 13.7 13.1 13.4 12.2*  HCT 43.1 39.9 40.7 36.5*  MCV 96.2 94.8 94.2 92.9  PLT 148* 135* 146* 146*     Basic Metabolic Panel: Recent Labs  Lab 03/30/21 1415 03/31/21 0426 04/01/21 0748 04/02/21 0443 04/03/21 0344  NA 136 136  --  139 138  K 4.0 4.4  --  3.3* 4.1  CL 100 107  --  106 106  CO2 25 20*  --  22 21*  GLUCOSE 164* 169*  --  127* 229*  BUN 25* 29*  --  31* 35*  CREATININE 1.75* 1.60*  --  1.68* 1.65*  CALCIUM 9.7 9.3  --  8.8* 8.8*  MG  --   --  1.8 1.7 1.9     GFR: Estimated Creatinine Clearance: 28.2 mL/min (A) (by C-G formula based on SCr of 1.65 mg/dL (H)).  Liver Function Tests: Recent Labs  Lab 03/30/21 1415 04/02/21 0443 04/03/21 0344  AST 26 30 25   ALT 22 24 24   ALKPHOS 48 56 52  BILITOT 1.0 1.3* 0.7  PROT 6.3* 5.4* 5.2*  ALBUMIN 3.6 2.9* 2.5*    No results for input(s): LIPASE, AMYLASE in the last 168  hours. No results for input(s): AMMONIA in the last 168 hours.  Coagulation Profile: Recent Labs  Lab 03/30/21 1415  INR 1.6*     Cardiac Enzymes: No results for input(s): CKTOTAL, CKMB, CKMBINDEX, TROPONINI in the last 168 hours.  BNP (last 3 results) No results for input(s): PROBNP in the last 8760 hours.  Lipid Profile: No results for input(s): CHOL, HDL, LDLCALC, TRIG, CHOLHDL, LDLDIRECT in the last 72 hours.  Thyroid Function Tests: No results for input(s): TSH, T4TOTAL, FREET4, T3FREE, THYROIDAB in the last 72 hours.  Anemia Panel: No results for input(s): VITAMINB12, FOLATE, FERRITIN, TIBC, IRON, RETICCTPCT in the last 72 hours.  Urine analysis:    Component Value Date/Time   COLORURINE YELLOW 03/30/2021 1435   APPEARANCEUR HAZY (A) 03/30/2021 1435   LABSPEC 1.017 03/30/2021 1435   PHURINE 5.0 03/30/2021 1435   GLUCOSEU NEGATIVE 03/30/2021 1435   HGBUR SMALL (A) 03/30/2021 1435   BILIRUBINUR NEGATIVE 03/30/2021 1435   KETONESUR NEGATIVE 03/30/2021 1435   PROTEINUR NEGATIVE 03/30/2021 1435   NITRITE NEGATIVE 03/30/2021 1435   LEUKOCYTESUR SMALL (A) 03/30/2021 1435    Sepsis Labs: Lactic Acid, Venous    Component Value Date/Time   LATICACIDVEN 1.5 03/30/2021 1631    MICROBIOLOGY: Recent Results (from the past 240 hour(s))  Blood Culture (routine x 2)     Status: None (Preliminary result)   Collection Time: 03/30/21  2:19 PM   Specimen: BLOOD  Result Value Ref Range Status   Specimen Description BLOOD LEFT ANTECUBITAL  Final   Special Requests   Final    BOTTLES DRAWN AEROBIC AND ANAEROBIC Blood Culture adequate volume   Culture   Final    NO GROWTH 4 DAYS Performed at [facility], [place] [address], [place], [place] [address]    Report Status PENDING  Incomplete  Blood Culture (routine x 2)     Status: None (Preliminary result)  Collection Time: 03/30/21  2:19 PM   Specimen: BLOOD  Result Value Ref Range Status   Specimen Description BLOOD  RIGHT ANTECUBITAL  Final   Special Requests   Final    BOTTLES DRAWN AEROBIC AND ANAEROBIC Blood Culture adequate volume   Culture   Final    NO GROWTH 4 DAYS Performed at [facility], [address] [place], [place] [address]    Report Status PENDING  Incomplete  Resp Panel by RT-PCR (Flu A&B, Covid) Nasopharyngeal Swab     Status: None   Collection Time: 03/30/21  2:52 PM   Specimen: Nasopharyngeal Swab; Nasopharyngeal(NP) swabs in vial transport medium  Result Value Ref Range Status   SARS Coronavirus 2 by RT PCR NEGATIVE NEGATIVE Final    Comment: (NOTE) SARS-CoV-2 target nucleic acids are NOT DETECTED.  The SARS-CoV-2 RNA is generally detectable in upper respiratory specimens during the acute phase of infection. The lowest concentration of SARS-CoV-2 viral copies this assay can detect is 138 copies/mL. A negative result does not preclude SARS-Cov-2 infection and should not be used as the sole basis for treatment or other patient management decisions. A negative result may occur with  improper specimen collection/handling, submission of specimen other than nasopharyngeal swab, presence of viral mutation(s) within the areas targeted by this assay, and inadequate number of viral copies(<138 copies/mL). A negative result must be combined with clinical observations, patient history, and epidemiological information. The expected result is Negative.  Fact Sheet for Patients:  EntrepreneurPulse.com.au  Fact Sheet for Healthcare Providers:  IncredibleEmployment.be  This test is no t yet approved or cleared by the [place] FDA and  has been authorized for detection and/or diagnosis of SARS-CoV-2 by FDA under an Emergency Use Authorization (EUA). This EUA will remain  in effect (meaning this test can be used) for the duration of the COVID-19 declaration under Section 564(b)(1) of the Act, 21 U.S.C.section 360bbb-3(b)(1), unless the  authorization is terminated  or revoked sooner.       Influenza A by PCR NEGATIVE NEGATIVE Final   Influenza B by PCR NEGATIVE NEGATIVE Final    Comment: (NOTE) The Xpert Xpress SARS-CoV-2/FLU/RSV plus assay is intended as an aid in the diagnosis of influenza from Nasopharyngeal swab specimens and should not be used as a sole basis for treatment. Nasal washings and aspirates are unacceptable for Xpert Xpress SARS-CoV-2/FLU/RSV testing.  Fact Sheet for Patients: EntrepreneurPulse.com.au  Fact Sheet for Healthcare Providers: IncredibleEmployment.be  This test is not yet approved or cleared by the [place] FDA and has been authorized for detection and/or diagnosis of SARS-CoV-2 by FDA under an Emergency Use Authorization (EUA). This EUA will remain in effect (meaning this test can be used) for the duration of the COVID-19 declaration under Section 564(b)(1) of the Act, 21 U.S.C. section 360bbb-3(b)(1), unless the authorization is terminated or revoked.  Performed at [facility], [place] [address], [place], [place] [address]   Urine culture     Status: Abnormal   Collection Time: 03/30/21  5:32 PM   Specimen: In/Out Cath Urine  Result Value Ref Range Status   Specimen Description IN/OUT CATH URINE  Final   Special Requests   Final    NONE Performed at [facility], [place] [address], [place], [place] [address]    Culture >=100,000 COLONIES/mL CITROBACTER KOSERI (A)  Final   Report Status 04/02/2021 FINAL  Final   Organism ID, Bacteria CITROBACTER KOSERI (A)  Final  Susceptibility   Citrobacter koseri - MIC*    CEFAZOLIN <=4 SENSITIVE Sensitive     CEFEPIME <=0.12 SENSITIVE Sensitive     CEFTRIAXONE <=0.25 SENSITIVE Sensitive     CIPROFLOXACIN <=0.25 SENSITIVE Sensitive     GENTAMICIN <=1 SENSITIVE Sensitive     IMIPENEM <=0.25 SENSITIVE Sensitive     NITROFURANTOIN <=16 SENSITIVE Sensitive     TRIMETH/SULFA <=20  SENSITIVE Sensitive     PIP/TAZO <=4 SENSITIVE Sensitive     * >=100,000 COLONIES/mL CITROBACTER KOSERI  SARS CORONAVIRUS 2 (TAT 6-24 HRS) Nasopharyngeal Nasopharyngeal Swab     Status: Abnormal   Collection Time: 04/02/21  4:27 AM   Specimen: Nasopharyngeal Swab  Result Value Ref Range Status   SARS Coronavirus 2 POSITIVE (A) NEGATIVE Final    Comment: (NOTE) SARS-CoV-2 target nucleic acids are DETECTED.  The SARS-CoV-2 RNA is generally detectable in upper and lower respiratory specimens during the acute phase of infection. Positive results are indicative of the presence of SARS-CoV-2 RNA. Clinical correlation with patient history and other diagnostic information is  necessary to determine patient infection status. Positive results do not rule out bacterial infection or co-infection with other viruses.  The expected result is Negative.  Fact Sheet for Patients: SugarRoll.be  Fact Sheet for Healthcare Providers: https://www.woods-mathews.com/  This test is not yet approved or cleared by the [place] FDA and  has been authorized for detection and/or diagnosis of SARS-CoV-2 by FDA under an Emergency Use Authorization (EUA). This EUA will remain  in effect (meaning this test can be used) for the duration of the COVID-19 declaration under Section 564(b)(1) of the Act, 21 U. S.C. section 360bbb-3(b)(1), unless the authorization is terminated or revoked sooner.   Performed at [facility], [place] [address], [place], [place] [address]     RADIOLOGY STUDIES/RESULTS:  DG Chest Port 1 View  Result Date: 04/02/2021 CLINICAL DATA:  Shortness of breath. EXAM: PORTABLE CHEST 1 VIEW COMPARISON:  03/31/2021 prior studies FINDINGS: Increased bilateral hazy opacities within both LOWER lungs noted. Cardiomegaly and mild pulmonary vascular congestion noted. There is no evidence of pneumothorax or acute bony abnormality. No large pleural effusions  are present. IMPRESSION: Increased bilateral LOWER lung hazy opacities which may represent airspace disease/pneumonia versus edema. Electronically Signed   By: Margarette Canada M.D.   On: 04/02/2021 09:36     LOS: 4 days  Signature  Oren Binet M.D on 04/03/2021 at 12:10 PM   -  To page go to [email or web address]

## 2021-04-03 NOTE — Plan of Care (Signed)
  Problem: Activity: Goal: Risk for activity intolerance will decrease Outcome: Progressing   Problem: Pain Managment: Goal: General experience of comfort will improve Outcome: Progressing   

## 2021-04-03 NOTE — Progress Notes (Signed)
Ok to transition ceftriaxone to cefdinir to complete 7d of therapy for his complicated UTI per Dr. Sloan Leiter.  Onnie Boer, PharmD, BCIDP, AAHIVP, CPP Infectious Disease Pharmacist 04/03/2021 9:08 AM

## 2021-04-04 ENCOUNTER — Other Ambulatory Visit (HOSPITAL_COMMUNITY): Payer: Medicare Other

## 2021-04-04 DIAGNOSIS — U071 COVID-19: Secondary | ICD-10-CM

## 2021-04-04 LAB — CBC WITH DIFFERENTIAL/PLATELET
Abs Immature Granulocytes: 0.06 10*3/uL (ref 0.00–0.07)
Basophils Absolute: 0 10*3/uL (ref 0.0–0.1)
Basophils Relative: 0 %
Eosinophils Absolute: 0 10*3/uL (ref 0.0–0.5)
Eosinophils Relative: 0 %
HCT: 37.9 % — ABNORMAL LOW (ref 39.0–52.0)
Hemoglobin: 12.4 g/dL — ABNORMAL LOW (ref 13.0–17.0)
Immature Granulocytes: 0 %
Lymphocytes Relative: 6 %
Lymphs Abs: 0.7 10*3/uL (ref 0.7–4.0)
MCH: 30.5 pg (ref 26.0–34.0)
MCHC: 32.7 g/dL (ref 30.0–36.0)
MCV: 93.3 fL (ref 80.0–100.0)
Monocytes Absolute: 0.5 10*3/uL (ref 0.1–1.0)
Monocytes Relative: 4 %
Neutro Abs: 12.2 10*3/uL — ABNORMAL HIGH (ref 1.7–7.7)
Neutrophils Relative %: 90 %
Platelets: 159 10*3/uL (ref 150–400)
RBC: 4.06 MIL/uL — ABNORMAL LOW (ref 4.22–5.81)
RDW: 13.3 % (ref 11.5–15.5)
WBC: 13.5 10*3/uL — ABNORMAL HIGH (ref 4.0–10.5)
nRBC: 0 % (ref 0.0–0.2)

## 2021-04-04 LAB — CULTURE, BLOOD (ROUTINE X 2)
Culture: NO GROWTH
Culture: NO GROWTH
Special Requests: ADEQUATE
Special Requests: ADEQUATE

## 2021-04-04 LAB — COMPREHENSIVE METABOLIC PANEL
ALT: 26 U/L (ref 0–44)
AST: 23 U/L (ref 15–41)
Albumin: 2.8 g/dL — ABNORMAL LOW (ref 3.5–5.0)
Alkaline Phosphatase: 50 U/L (ref 38–126)
Anion gap: 11 (ref 5–15)
BUN: 44 mg/dL — ABNORMAL HIGH (ref 8–23)
CO2: 22 mmol/L (ref 22–32)
Calcium: 8.7 mg/dL — ABNORMAL LOW (ref 8.9–10.3)
Chloride: 105 mmol/L (ref 98–111)
Creatinine, Ser: 1.74 mg/dL — ABNORMAL HIGH (ref 0.61–1.24)
GFR, Estimated: 37 mL/min — ABNORMAL LOW (ref >60)
Glucose, Bld: 215 mg/dL — ABNORMAL HIGH (ref 70–99)
Potassium: 3.8 mmol/L (ref 3.5–5.1)
Sodium: 138 mmol/L (ref 135–145)
Total Bilirubin: 0.7 mg/dL (ref 0.3–1.2)
Total Protein: 5.7 g/dL — ABNORMAL LOW (ref 6.5–8.1)

## 2021-04-04 LAB — BRAIN NATRIURETIC PEPTIDE: B Natriuretic Peptide: 462 pg/mL — ABNORMAL HIGH (ref 0.0–100.0)

## 2021-04-04 LAB — MAGNESIUM: Magnesium: 1.9 mg/dL (ref 1.7–2.4)

## 2021-04-04 LAB — D-DIMER, QUANTITATIVE: D-Dimer, Quant: 0.67 ug/mL-FEU — ABNORMAL HIGH (ref 0.00–0.50)

## 2021-04-04 LAB — C-REACTIVE PROTEIN: CRP: 9 mg/dL — ABNORMAL HIGH (ref <1.0)

## 2021-04-04 MED ORDER — BENZONATATE 100 MG PO CAPS: 100.0000 mg | ORAL_CAPSULE | Freq: Three times a day (TID) | ORAL | 0 refills | Status: AC | PRN

## 2021-04-04 MED ORDER — BACITRACIN ZINC 500 UNIT/GM EX OINT: TOPICAL_OINTMENT | Freq: Two times a day (BID) | CUTANEOUS | 0 refills | Status: AC

## 2021-04-04 MED ORDER — APIXABAN 2.5 MG PO TABS: 2.5000 mg | ORAL_TABLET | Freq: Two times a day (BID) | ORAL | 1 refills | Status: AC

## 2021-04-04 MED ORDER — PREDNISONE 5 MG PO TABS: ORAL_TABLET | ORAL | 0 refills | Status: DC

## 2021-04-04 MED ORDER — ALBUTEROL SULFATE HFA 108 (90 BASE) MCG/ACT IN AERS: 2.0000 | INHALATION_SPRAY | Freq: Four times a day (QID) | RESPIRATORY_TRACT | 0 refills | Status: AC | PRN

## 2021-04-04 MED ORDER — CEFDINIR 300 MG PO CAPS: 300.0000 mg | ORAL_CAPSULE | Freq: Every day | ORAL | 0 refills | Status: DC

## 2021-04-04 MED ORDER — TAMSULOSIN HCL 0.4 MG PO CAPS
0.4000 mg | ORAL_CAPSULE | Freq: Every day | ORAL | 1 refills | Status: AC
Start: 2021-04-04 — End: ?

## 2021-04-04 NOTE — Discharge Summary (Signed)
PATIENT DETAILS Name: Casey Lee Age: 87 y.o. Sex: male Date of Birth: July 26, 1928 MRN: [id number]. Admitting Physician: Karmen Bongo, MD IOE:VOJJK, [name], MD  Admit Date: 03/30/2021 Discharge date: 04/04/2021  Recommendations for Outpatient Follow-up:  Follow up with PCP in 1-2 weeks Please obtain CMP/CBC in one week Repeat Chest Xray in 4-6 week Needs outpatient referral to urology.   Admitted From:  Home  Disposition: Home with home health services (doesn't want to  go w SNF-elderly wife at home)   Crary: Yes  Equipment/Devices: None  Discharge Condition: Stable  CODE STATUS: FULL CODE  Diet recommendation:  Diet Order             Diet - low sodium heart healthy           Diet regular Room service appropriate? Yes; Fluid consistency: Thin  Diet effective now                    Brief Narrative: Patient is a 87 y.o. male persistent atrial fibrillation on Eliquis, HLD, giant cell arteritis (on chronic prednisone) presented with fever, slight confusion-he was found to have sepsis physiology and started on broad-spectrum antimicrobial therapy.  He does acknowledge dysuria for 3 days prior to this hospitalization.  He has a long history of urinary urgency/hesitancy and poor stream.   Significant events: 6/28>> admit for sepsis-likely due to UTI. 7/1>> positive for COVID (spouse at home tested positive)   Significant studies: 6/28>> CXR: No pneumonia 6/29>> CXR: Possible mild CHF. 7/01>> CXR: Increased bilateral lower zone hazy opacities-pneumonia versus edema.   Antimicrobial therapy: Cefepime: 6/28 x 1 Vancomycin: 6/2 x1 Flagyl: 6/28>> 6/29 Ceftriaxone: 6/29>> 7/1 Cefdinir: 7/2>>   Covid 19 therapy: 7/1>>monoclonal antibody x1 Decadron: 7/1>>   Microbiology data: 6/28>> COVID/influenza PCR: Negative 6/28>> blood culture: No growth 6/28>> urine culture: Pansensitive Citrobacter.   Procedures : None   Consults: None     Brief Hospital Course: Sepsis due to complicated UTI: Sepsis physiology has resolved-initially on Rocephin-has been transitioned to Omnicef-plan is to complete a total of 7 days of treatment.  T   COVID-19 infection: Initially COVID negative-but on 7/1-his spouse tested positive for COVID-subsequently repeat COVID-19 PCR was positive on 7/1.  He is not hypoxic-he is on room air-he has very mild exertional dyspnea that has actually gotten better over the past few days.  Chest x-ray is suggestive of either CHF or pneumonia-both BNP and CRP is significantly elevated.  He was treated with as needed doses of Lasix-although he did not have any peripheral edema-he was kept in negative balance.  He was also placed on higher doses of steroids-with these measures he has improved-his exertional dyspnea is markedly improved over the past few days.  Plan is to discharge him on tapering prednisone.  He is aware that if his shortness of breath worsens-he needs to seek immediate medical attention.     Severe delirium: Resolved-completely awake and alert this morning.  Probably due to sepsis from UTI and COVID-19.     Probable BPH: Acknowledges symptoms of prostatism for the past few years-starting Flomax-once infection issues overnight-we will need outpatient follow-up with urology.  Likely the cause of UTI.   CKD stage IIIb: Creatinine close to baseline.  Follow periodically.   Persistent atrial fibrillation: Continue Eliquis (dose adjusted by pharmacy to 2.5 mg twice daily)-hold Toprol for now.   History of giant cell arteritis: On chronic steroids-see above    Procedures/Studies: None  Discharge Diagnoses:  Principal Problem:   Sepsis due to undetermined organism [facility]) Active Problems:   Essential hypertension   Hyperlipidemia LDL goal <70   PAF (paroxysmal atrial fibrillation) [facility])   Discharge Instructions:    Person Under Monitoring Name: Casey Lee  Location: [place] [address]   Infection Prevention Recommendations for Individuals Confirmed to have, or Being Evaluated for, 2019 Novel Coronavirus (COVID-19) Infection Who Receive Care at Home  Individuals who are confirmed to have, or are being evaluated for, COVID-19 should follow the prevention steps below until a healthcare provider or local or state health department says they can return to normal activities.  Stay home except to get medical care You should restrict activities outside your home, except for getting medical care. Do not go to work, school, or public areas, and do not use public transportation or taxis.  Call ahead before visiting your doctor Before your medical appointment, call the healthcare provider and tell them that you have, or are being evaluated for, COVID-19 infection. This will help the healthcare provider's office take steps to keep other people from getting infected. Ask your healthcare provider to call the local or state health department.  Monitor your symptoms Seek prompt medical attention if your illness is worsening (e.g., difficulty breathing). Before going to your medical appointment, call the healthcare provider and tell them that you have, or are being evaluated for, COVID-19 infection. Ask your healthcare provider to call the local or state health department.  Wear a facemask You should wear a facemask that covers your nose and mouth when you are in the same room with other people and when you visit a healthcare provider. People who live with or visit you should also wear a facemask while they are in the same room with you.  Separate yourself from other people in your home As much as possible, you should stay in a different room from other people in your home. Also, you should use a separate bathroom, if available.  Avoid sharing household items You should not share dishes, drinking glasses, cups, eating utensils, towels, bedding, or  other items with other people in your home. After using these items, you should wash them thoroughly with soap and water.  Cover your coughs and sneezes Cover your mouth and nose with a tissue when you cough or sneeze, or you can cough or sneeze into your sleeve. Throw used tissues in a lined trash can, and immediately wash your hands with soap and water for at least 20 seconds or use an alcohol-based hand rub.  Wash your [facility] your hands often and thoroughly with soap and water for at least 20 seconds. You can use an alcohol-based hand sanitizer if soap and water are not available and if your hands are not visibly dirty. Avoid touching your eyes, nose, and mouth with unwashed hands.   Prevention Steps for Caregivers and Household Members of Individuals Confirmed to have, or Being Evaluated for, COVID-19 Infection Being Cared for in the Home  If you live with, or provide care at home for, a person confirmed to have, or being evaluated for, COVID-19 infection please follow these guidelines to prevent infection:  Follow healthcare provider's instructions Make sure that you understand and can help the patient follow any healthcare provider instructions for all care.  Provide for the patient's basic needs You should help the patient with basic needs in the home and provide support for getting groceries, prescriptions, and other personal needs.  Monitor  the patient's symptoms If they are getting sicker, call his or her medical provider and tell them that the patient has, or is being evaluated for, COVID-19 infection. This will help the healthcare provider's office take steps to keep other people from getting infected. Ask the healthcare provider to call the local or state health department.  Limit the number of people who have contact with the patient If possible, have only one caregiver for the patient. Other household members should stay in another home or place of residence. If  this is not possible, they should stay in another room, or be separated from the patient as much as possible. Use a separate bathroom, if available. Restrict visitors who do not have an essential need to be in the home.  Keep older adults, very young children, and other sick people away from the patient Keep older adults, very young children, and those who have compromised immune systems or chronic health conditions away from the patient. This includes people with chronic heart, lung, or kidney conditions, diabetes, and cancer.  Ensure good ventilation Make sure that shared spaces in the home have good air flow, such as from an air conditioner or an opened window, weather permitting.  Wash your hands often Wash your hands often and thoroughly with soap and water for at least 20 seconds. You can use an alcohol based hand sanitizer if soap and water are not available and if your hands are not visibly dirty. Avoid touching your eyes, nose, and mouth with unwashed hands. Use disposable paper towels to dry your hands. If not available, use dedicated cloth towels and replace them when they become wet.  Wear a facemask and gloves Wear a disposable facemask at all times in the room and gloves when you touch or have contact with the patient's blood, body fluids, and/or secretions or excretions, such as sweat, saliva, sputum, nasal mucus, vomit, urine, or feces.  Ensure the mask fits over your nose and mouth tightly, and do not touch it during use. Throw out disposable facemasks and gloves after using them. Do not reuse. Wash your hands immediately after removing your facemask and gloves. If your personal clothing becomes contaminated, carefully remove clothing and launder. Wash your hands after handling contaminated clothing. Place all used disposable facemasks, gloves, and other waste in a lined container before disposing them with other household waste. Remove gloves and wash your hands immediately  after handling these items.  Do not share dishes, glasses, or other household items with the patient Avoid sharing household items. You should not share dishes, drinking glasses, cups, eating utensils, towels, bedding, or other items with a patient who is confirmed to have, or being evaluated for, COVID-19 infection. After the person uses these items, you should wash them thoroughly with soap and water.  Wash laundry thoroughly Immediately remove and wash clothes or bedding that have blood, body fluids, and/or secretions or excretions, such as sweat, saliva, sputum, nasal mucus, vomit, urine, or feces, on them. Wear gloves when handling laundry from the patient. Read and follow directions on labels of laundry or clothing items and detergent. In general, wash and dry with the warmest temperatures recommended on the label.  Clean all areas the individual has used often Clean all touchable surfaces, such as counters, tabletops, doorknobs, bathroom fixtures, toilets, phones, keyboards, tablets, and bedside tables, every day. Also, clean any surfaces that may have blood, body fluids, and/or secretions or excretions on them. Wear gloves when cleaning surfaces the patient has come  in contact with. Use a diluted bleach solution (e.g., dilute bleach with 1 part bleach and 10 parts water) or a household disinfectant with a label that says EPA-registered for coronaviruses. To make a bleach solution at home, add 1 tablespoon of bleach to 1 quart (4 cups) of water. For a larger supply, add  cup of bleach to 1 gallon (16 cups) of water. Read labels of cleaning products and follow recommendations provided on product labels. Labels contain instructions for safe and effective use of the cleaning product including precautions you should take when applying the product, such as wearing gloves or eye protection and making sure you have good ventilation during use of the product. Remove gloves and wash hands immediately  after cleaning.  Monitor yourself for signs and symptoms of illness Caregivers and household members are considered close contacts, should monitor their health, and will be asked to limit movement outside of the home to the extent possible. Follow the monitoring steps for close contacts listed on the symptom monitoring form.   ? If you have additional questions, contact your local health department or call the epidemiologist on call at [phone] (available 24/7). ? This guidance is subject to change. For the most up-to-date guidance from CDC, please refer to their website: YouBlogs.pl    Activity:  As tolerated with Full fall precautions use walker/cane & assistance as needed   Discharge Instructions     Call MD for:  difficulty breathing, headache or visual disturbances   Complete by: As directed    Diet - low sodium heart healthy   Complete by: As directed    Discharge instructions   Complete by: As directed    1.)  10 days of isolation from 7/1  2.)  If you develop worsening shortness of breath-please seek immediate medical attention  3.)  Please ask for referral to urology from your primary care practitioner   Follow with Primary MD  [name], MD in 1-2 weeks  Please get a complete blood count and chemistry panel checked by your Primary MD at your next visit, and again as instructed by your Primary MD.  Get Medicines reviewed and adjusted: Please take all your medications with you for your next visit with your Primary MD  Laboratory/radiological data: Please request your Primary MD to go over all hospital tests and procedure/radiological results at the follow up, please ask your Primary MD to get all Hospital records sent to his/her office.  In some cases, they will be blood work, cultures and biopsy results pending at the time of your discharge. Please request that your primary care M.D. follows up on  these results.  Also Note the following: If you experience worsening of your admission symptoms, develop shortness of breath, life threatening emergency, suicidal or homicidal thoughts you must seek medical attention immediately by calling 911 or calling your MD immediately  if symptoms less severe.  You must read complete instructions/literature along with all the possible adverse reactions/side effects for all the Medicines you take and that have been prescribed to you. Take any new Medicines after you have completely understood and accpet all the possible adverse reactions/side effects.   Do not drive when taking Pain medications or sleeping medications (Benzodaizepines)  Do not take more than prescribed Pain, Sleep and Anxiety Medications. It is not advisable to combine anxiety,sleep and pain medications without talking with your primary care practitioner  Special Instructions: If you have smoked or chewed Tobacco  in the last 2 yrs please  stop smoking, stop any regular Alcohol  and or any Recreational drug use.  Wear Seat belts while driving.  Please note: You were cared for by a hospitalist during your hospital stay. Once you are discharged, your primary care physician will handle any further medical issues. Please note that NO REFILLS for any discharge medications will be authorized once you are discharged, as it is imperative that you return to your primary care physician (or establish a relationship with a primary care physician if you do not have one) for your post hospital discharge needs so that they can reassess your need for medications and monitor your lab values.   Discharge wound care:   Complete by: As directed    Apply a thin layer of bacitracin ointment, top with dry gauze 2x2 and secure with small silicone bordered foam dressing. May reuse silicone foam for up to 3 days.  Perform wound care twice daily.   Increase activity slowly   Complete by: As directed        Allergies as of 04/04/2021       Reactions   Fosamax [alendronate] Other (See Comments)   SEVERE JAW(BONE) PAIN        Medication List     TAKE these medications    albuterol 108 (90 Base) MCG/ACT inhaler Commonly known as: VENTOLIN HFA Inhale 2 puffs into the lungs every 6 (six) hours as needed for wheezing or shortness of breath.   apixaban 2.5 MG Tabs tablet Commonly known as: ELIQUIS Take 1 tablet (2.5 mg total) by mouth 2 (two) times daily. What changed:  medication strength how much to take   bacitracin ointment Apply topically 2 (two) times daily for 2 days. Apply to left elbow skin tear in a thin layer after cleansing.   benzonatate 100 MG capsule Commonly known as: Tessalon Perles Take 1 capsule (100 mg total) by mouth 3 (three) times daily as needed for cough.   CALCIUM/D3 ADULT GUMMIES PO Take 3 tablets by mouth See admin instructions. CHEW 3 gummies by mouth daily   cefdinir 300 MG capsule Commonly known as: OMNICEF Take 1 capsule (300 mg total) by mouth daily.   COQ10 PO Take 1 capsule by mouth daily with lunch.   loratadine 10 MG tablet Commonly known as: CLARITIN Take 10 mg by mouth daily.   metoprolol succinate 25 MG 24 hr tablet Commonly known as: TOPROL-XL Take 1 tablet (25 mg total) by mouth daily.   predniSONE 5 MG tablet Commonly known as: DELTASONE Take 6 tablets (30 mg) p.o. daily for 2 days, then, Take 4 tablets (20 mg) p.o. daily for 2 days, then, Take 2 tablets (10 mg) p.o. daily for 2 days, then, Resume your usual dosing of 5 mg p.o. daily. What changed:  how much to take how to take this when to take this additional instructions   tamsulosin 0.4 MG Caps capsule Commonly known as: FLOMAX Take 1 capsule (0.4 mg total) by mouth daily after supper.               Durable Medical Equipment  (From admission, onward)           Start     Ordered   04/01/21 1132  For home use only DME Walker rolling  Once        Comments: 5 wheel  Question Answer Comment  Walker: With 5 Inch Wheels   Patient needs a walker to treat with the following condition Weakness  04/01/21 1131              Discharge Care Instructions  (From admission, onward)           Start     Ordered   04/04/21 0000  Discharge wound care:       Comments: Apply a thin layer of bacitracin ointment, top with dry gauze 2x2 and secure with small silicone bordered foam dressing. May reuse silicone foam for up to 3 days.  Perform wound care twice daily.   04/04/21 1018            Follow-up Information     [name], MD. Schedule an appointment as soon as possible for a visit in 1 week(s).   Specialty: Endocrinology Why: Hospital follow up Contact information: [address] [place] [address] [phone]                Allergies  Allergen Reactions   Fosamax [Alendronate] Other (See Comments)    SEVERE JAW(BONE) PAIN      Consultations:  None   Other Procedures/Studies: DG Chest Port 1 View  Result Date: 04/02/2021 CLINICAL DATA:  Shortness of breath. EXAM: PORTABLE CHEST 1 VIEW COMPARISON:  03/31/2021 prior studies FINDINGS: Increased bilateral hazy opacities within both LOWER lungs noted. Cardiomegaly and mild pulmonary vascular congestion noted. There is no evidence of pneumothorax or acute bony abnormality. No large pleural effusions are present. IMPRESSION: Increased bilateral LOWER lung hazy opacities which may represent airspace disease/pneumonia versus edema. Electronically Signed   By: Margarette Canada M.D.   On: 04/02/2021 09:36   DG Chest Port 1 View  Result Date: 03/30/2021 CLINICAL DATA:  Question sepsis EXAM: PORTABLE CHEST 1 VIEW COMPARISON:  05/11/2018 FINDINGS: The heart size and mediastinal contours are within normal limits. Both lungs are clear. The visualized skeletal structures are unremarkable. IMPRESSION: No active disease. Electronically Signed   By: Franchot Gallo M.D.   On: 03/30/2021 14:58   DG Chest Port 1V same Day  Result Date: 03/31/2021 CLINICAL DATA:  Shortness of breath.  Cough. EXAM: PORTABLE CHEST 1 VIEW COMPARISON:  03/30/2021. FINDINGS: Cardiomegaly. Mild pulmonary venous congestion. Very mild bilateral interstitial prominence. Findings suggest mild CHF. Pneumonitis cannot be completely excluded. No pleural effusion or pneumothorax. IMPRESSION: Cardiomegaly with mild pulmonary venous congestion and very mild bilateral interstitial prominence. Findings suggest mild CHF. Pneumonitis cannot be completely excluded. Electronically Signed   By: Marcello Moores  Register   On: 03/31/2021 08:05     TODAY-DAY OF DISCHARGE:  Subjective:   Casey Lee today has no headache,no chest abdominal pain,no new weakness tingling or numbness, feels much better wants to go home today.   Objective:   Blood pressure 133/85, pulse (!) 109, temperature (!) 97.5 F (36.4 C), temperature source Oral, resp. rate 18, height 5\' 9"  (1.753 m), weight 67.1 kg, SpO2 94 %.  Intake/Output Summary (Last 24 hours) at 04/04/2021 1019 Last data filed at 04/04/2021 0733 Gross per 24 hour  Intake 240 ml  Output 700 ml  Net -460 ml   Filed Weights   03/31/21 0529 03/31/21 2045 04/02/21 0344  Weight: 67.1 kg 67.1 kg 67.1 kg    Exam: Awake Alert, Oriented *3, No new F.N deficits, Normal affect [place].AT,PERRAL Supple Neck,No JVD, No cervical lymphadenopathy appriciated.  Symmetrical Chest wall movement, Good air movement bilaterally, CTAB RRR,No Gallops,Rubs or new Murmurs, No Parasternal Heave +ve B.Sounds, Abd Soft, Non tender, No organomegaly appriciated, No rebound -guarding or rigidity. No Cyanosis,  Clubbing or edema, No new Rash or bruise   PERTINENT RADIOLOGIC STUDIES: DG Chest Port 1 View  Result Date: 04/02/2021 CLINICAL DATA:  Shortness of breath. EXAM: PORTABLE CHEST 1 VIEW COMPARISON:  03/31/2021 prior studies FINDINGS: Increased bilateral hazy opacities within  both LOWER lungs noted. Cardiomegaly and mild pulmonary vascular congestion noted. There is no evidence of pneumothorax or acute bony abnormality. No large pleural effusions are present. IMPRESSION: Increased bilateral LOWER lung hazy opacities which may represent airspace disease/pneumonia versus edema. Electronically Signed   By: Margarette Canada M.D.   On: 04/02/2021 09:36   DG Chest Port 1 View  Result Date: 03/30/2021 CLINICAL DATA:  Question sepsis EXAM: PORTABLE CHEST 1 VIEW COMPARISON:  05/11/2018 FINDINGS: The heart size and mediastinal contours are within normal limits. Both lungs are clear. The visualized skeletal structures are unremarkable. IMPRESSION: No active disease. Electronically Signed   By: Franchot Gallo M.D.   On: 03/30/2021 14:58   DG Chest Port 1V same Day  Result Date: 03/31/2021 CLINICAL DATA:  Shortness of breath.  Cough. EXAM: PORTABLE CHEST 1 VIEW COMPARISON:  03/30/2021. FINDINGS: Cardiomegaly. Mild pulmonary venous congestion. Very mild bilateral interstitial prominence. Findings suggest mild CHF. Pneumonitis cannot be completely excluded. No pleural effusion or pneumothorax. IMPRESSION: Cardiomegaly with mild pulmonary venous congestion and very mild bilateral interstitial prominence. Findings suggest mild CHF. Pneumonitis cannot be completely excluded. Electronically Signed   By: Marcello Moores  Register   On: 03/31/2021 08:05     PERTINENT LAB RESULTS: CBC: Recent Labs    04/03/21 0344 04/04/21 0638  WBC 8.6 13.5*  HGB 12.2* 12.4*  HCT 36.5* 37.9*  PLT 146* 159   CMET CMP     Component Value Date/Time   NA 138 04/04/2021 0638   K 3.8 04/04/2021 0638   CL 105 04/04/2021 0638   CO2 22 04/04/2021 0638   GLUCOSE 215 (H) 04/04/2021 0638   BUN 44 (H) 04/04/2021 0638   CREATININE 1.74 (H) 04/04/2021 0638   CALCIUM 8.7 (L) 04/04/2021 0638   PROT 5.7 (L) 04/04/2021 0638   ALBUMIN 2.8 (L) 04/04/2021 0638   AST 23 04/04/2021 0638   ALT 26 04/04/2021 0638   ALKPHOS 50  04/04/2021 0638   BILITOT 0.7 04/04/2021 0638   GFRNONAA 37 (L) 04/04/2021 0638   GFRAA 45 (L) 06/08/2018 2116    GFR Estimated Creatinine Clearance: 26.8 mL/min (A) (by C-G formula based on SCr of 1.74 mg/dL (H)). No results for input(s): LIPASE, AMYLASE in the last 72 hours. No results for input(s): CKTOTAL, CKMB, CKMBINDEX, TROPONINI in the last 72 hours. Invalid input(s): [place]    04/03/21 0344 04/04/21 0638  DDIMER 0.59* 0.67*   No results for input(s): HGBA1C in the last 72 hours. No results for input(s): CHOL, HDL, LDLCALC, TRIG, CHOLHDL, LDLDIRECT in the last 72 hours. No results for input(s): TSH, T4TOTAL, T3FREE, THYROIDAB in the last 72 hours.  Invalid input(s): FREET3 No results for input(s): VITAMINB12, FOLATE, FERRITIN, TIBC, IRON, RETICCTPCT in the last 72 hours. Coags: No results for input(s): INR in the last 72 hours.  Invalid input(s): PT Microbiology: Recent Results (from the past 240 hour(s))  Blood Culture (routine x 2)     Status: None   Collection Time: 03/30/21  2:19 PM   Specimen: BLOOD  Result Value Ref Range Status   Specimen Description BLOOD LEFT ANTECUBITAL  Final   Special Requests   Final    BOTTLES DRAWN AEROBIC AND ANAEROBIC Blood Culture adequate volume  Culture   Final    NO GROWTH 5 DAYS Performed at [facility], [place] [address] [place], [place] [address]    Report Status 04/04/2021 FINAL  Final  Blood Culture (routine x 2)     Status: None   Collection Time: 03/30/21  2:19 PM   Specimen: BLOOD  Result Value Ref Range Status   Specimen Description BLOOD RIGHT ANTECUBITAL  Final   Special Requests   Final    BOTTLES DRAWN AEROBIC AND ANAEROBIC Blood Culture adequate volume   Culture   Final    NO GROWTH 5 DAYS Performed at [facility], [place] [address] [place], [place] [address]    Report Status 04/04/2021 FINAL  Final  Resp Panel by RT-PCR (Flu A&B, Covid) Nasopharyngeal Swab     Status: None    Collection Time: 03/30/21  2:52 PM   Specimen: Nasopharyngeal Swab; Nasopharyngeal(NP) swabs in vial transport medium  Result Value Ref Range Status   SARS Coronavirus 2 by RT PCR NEGATIVE NEGATIVE Final    Comment: (NOTE) SARS-CoV-2 target nucleic acids are NOT DETECTED.  The SARS-CoV-2 RNA is generally detectable in upper respiratory specimens during the acute phase of infection. The lowest concentration of SARS-CoV-2 viral copies this assay can detect is 138 copies/mL. A negative result does not preclude SARS-Cov-2 infection and should not be used as the sole basis for treatment or other patient management decisions. A negative result may occur with  improper specimen collection/handling, submission of specimen other than nasopharyngeal swab, presence of viral mutation(s) within the areas targeted by this assay, and inadequate number of viral copies(<138 copies/mL). A negative result must be combined with clinical observations, patient history, and epidemiological information. The expected result is Negative.  Fact Sheet for Patients:  EntrepreneurPulse.com.au  Fact Sheet for Healthcare Providers:  IncredibleEmployment.be  This test is no t yet approved or cleared by the [place] FDA and  has been authorized for detection and/or diagnosis of SARS-CoV-2 by FDA under an Emergency Use Authorization (EUA). This EUA will remain  in effect (meaning this test can be used) for the duration of the COVID-19 declaration under Section 564(b)(1) of the Act, 21 U.S.C.section 360bbb-3(b)(1), unless the authorization is terminated  or revoked sooner.       Influenza A by PCR NEGATIVE NEGATIVE Final   Influenza B by PCR NEGATIVE NEGATIVE Final    Comment: (NOTE) The Xpert Xpress SARS-CoV-2/FLU/RSV plus assay is intended as an aid in the diagnosis of influenza from Nasopharyngeal swab specimens and should not be used as a sole basis for treatment.  Nasal washings and aspirates are unacceptable for Xpert Xpress SARS-CoV-2/FLU/RSV testing.  Fact Sheet for Patients: EntrepreneurPulse.com.au  Fact Sheet for Healthcare Providers: IncredibleEmployment.be  This test is not yet approved or cleared by the [place] FDA and has been authorized for detection and/or diagnosis of SARS-CoV-2 by FDA under an Emergency Use Authorization (EUA). This EUA will remain in effect (meaning this test can be used) for the duration of the COVID-19 declaration under Section 564(b)(1) of the Act, 21 U.S.C. section 360bbb-3(b)(1), unless the authorization is terminated or revoked.  Performed at [facility], [place] [address] [place],  [address]   Urine culture     Status: Abnormal   Collection Time: 03/30/21  5:32 PM   Specimen: In/Out Cath Urine  Result Value Ref Range Status   Specimen Description IN/OUT CATH URINE  Final   Special Requests   Final  NONE Performed at [facility], [place] [address] [place], [place] [address]    Culture >=100,000 COLONIES/mL CITROBACTER KOSERI (A)  Final   Report Status 04/02/2021 FINAL  Final   Organism ID, Bacteria CITROBACTER KOSERI (A)  Final      Susceptibility   Citrobacter koseri - MIC*    CEFAZOLIN <=4 SENSITIVE Sensitive     CEFEPIME <=0.12 SENSITIVE Sensitive     CEFTRIAXONE <=0.25 SENSITIVE Sensitive     CIPROFLOXACIN <=0.25 SENSITIVE Sensitive     GENTAMICIN <=1 SENSITIVE Sensitive     IMIPENEM <=0.25 SENSITIVE Sensitive     NITROFURANTOIN <=16 SENSITIVE Sensitive     TRIMETH/SULFA <=20 SENSITIVE Sensitive     PIP/TAZO <=4 SENSITIVE Sensitive     * >=100,000 COLONIES/mL CITROBACTER KOSERI  SARS CORONAVIRUS 2 (TAT 6-24 HRS) Nasopharyngeal Nasopharyngeal Swab     Status: Abnormal   Collection Time: 04/02/21  4:27 AM   Specimen: Nasopharyngeal Swab  Result Value Ref Range Status   SARS Coronavirus 2 POSITIVE (A) NEGATIVE Final    Comment:  (NOTE) SARS-CoV-2 target nucleic acids are DETECTED.  The SARS-CoV-2 RNA is generally detectable in upper and lower respiratory specimens during the acute phase of infection. Positive results are indicative of the presence of SARS-CoV-2 RNA. Clinical correlation with patient history and other diagnostic information is  necessary to determine patient infection status. Positive results do not rule out bacterial infection or co-infection with other viruses.  The expected result is Negative.  Fact Sheet for Patients: SugarRoll.be  Fact Sheet for Healthcare Providers: https://www.woods-mathews.com/  This test is not yet approved or cleared by the [place] FDA and  has been authorized for detection and/or diagnosis of SARS-CoV-2 by FDA under an Emergency Use Authorization (EUA). This EUA will remain  in effect (meaning this test can be used) for the duration of the COVID-19 declaration under Section 564(b)(1) of the Act, 21 U. S.C. section 360bbb-3(b)(1), unless the authorization is terminated or revoked sooner.   Performed at [facility], [place] [address], [place], [place] [address]     FURTHER DISCHARGE INSTRUCTIONS:  Get Medicines reviewed and adjusted: Please take all your medications with you for your next visit with your Primary MD  Laboratory/radiological data: Please request your Primary MD to go over all hospital tests and procedure/radiological results at the follow up, please ask your Primary MD to get all Hospital records sent to his/her office.  In some cases, they will be blood work, cultures and biopsy results pending at the time of your discharge. Please request that your primary care M.D. goes through all the records of your hospital data and follows up on these results.  Also Note the following: If you experience worsening of your admission symptoms, develop shortness of breath, life threatening emergency,  suicidal or homicidal thoughts you must seek medical attention immediately by calling 911 or calling your MD immediately  if symptoms less severe.  You must read complete instructions/literature along with all the possible adverse reactions/side effects for all the Medicines you take and that have been prescribed to you. Take any new Medicines after you have completely understood and accpet all the possible adverse reactions/side effects.   Do not drive when taking Pain medications or sleeping medications (Benzodaizepines)  Do not take more than prescribed Pain, Sleep and Anxiety Medications. It is not advisable to combine anxiety,sleep and pain medications without talking with your primary care practitioner  Special Instructions: If you have smoked or chewed  Tobacco  in the last 2 yrs please stop smoking, stop any regular Alcohol  and or any Recreational drug use.  Wear Seat belts while driving.  Please note: You were cared for by a hospitalist during your hospital stay. Once you are discharged, your primary care physician will handle any further medical issues. Please note that NO REFILLS for any discharge medications will be authorized once you are discharged, as it is imperative that you return to your primary care physician (or establish a relationship with a primary care physician if you do not have one) for your post hospital discharge needs so that they can reassess your need for medications and monitor your lab values.  Total Time spent coordinating discharge including counseling, education and face to face time equals 35 minutes.  SignedOren Binet 04/04/2021 10:19 AM

## 2021-04-04 NOTE — Plan of Care (Signed)
  Problem: Health Behavior/Discharge Planning: Goal: Ability to manage health-related needs will improve Outcome: Progressing   Problem: Pain Managment: Goal: General experience of comfort will improve Outcome: Progressing   Problem: Safety: Goal: Ability to remain free from injury will improve Outcome: Progressing   Problem: Skin Integrity: Goal: Risk for impaired skin integrity will decrease Outcome: Progressing

## 2021-04-04 NOTE — Progress Notes (Signed)
DISCHARGE NOTE HOME Casey MICHAELSEN to be discharged Home per MD order. Discussed prescriptions and follow up appointments with the patient. Prescriptions given to patient; medication list explained in detail. Patient verbalized understanding.  Skin clean, dry and intact without evidence of skin break down, no evidence of skin tears noted. IV catheter discontinued intact. Site without signs and symptoms of complications. Dressing and pressure applied. Pt denies pain at the site currently. No complaints noted.  Patient free of lines, drains, and wounds.   An After Visit Summary (AVS) was printed and given to the patient. Patient escorted via wheelchair, and discharged home via private auto.  Berneta Levins, RN

## 2021-04-04 NOTE — TOC Initial Note (Addendum)
Transition of Care [facility]) - Initial/Assessment Note    Patient Details  Name: Casey Lee MRN: [id number] Date of Birth: 16-Dec-1927  [facility] CM/SW Contact:    Casey Crews, RN Phone Number: [phone] 04/04/2021, 11:10 AM  Clinical Narrative:                  Spoke with patient's son, Casey Lee, on his mobile phone. Casey Lee is from [place] but is currently staying with his parents indefinitely to provide support. Demographics verified. Current PCP is at [facility] and patient has a pending appointment for lab work. PTA patient was ambulatory and provided assistance and dialysis transportation for spouse. Discussed transportation resources and home care resources. Casey Lee agreeable to referral to [facility] to further his existing research in this area. Discussed HH needs and educated about the difference between home health and home care services. Offered choice of home health. [facility] accepted referral. Casey Lee to provide transportation home. TOC following for transportation needs.   Update: Spoke with son on his mobile to advise of accepting [facility] agency.  Expected Discharge Plan: [place] Barriers to Discharge: No Barriers Identified   Patient Goals and CMS Choice Patient states their goals for this hospitalization and ongoing recovery are:: return home with family support CMS Medicare.gov Compare Post Acute Care list provided to:: Patient Represenative (must comment) Casey Lee (son)) Choice offered to / list presented to : Adult Children  Expected Discharge Plan and Services Expected Discharge Plan: [place]   Discharge Planning Services: CM Consult Post Acute Care Choice: [place] arrangements for the past 2 months: [place] Expected Discharge Date: 04/04/21               DME Arranged: N/A DME Agency: NA       HH Arranged: RN, PT, OT, Nurse's Aide, Social Work [employer] Agency: [place] Date [place]: 04/04/21 Time [place]: 1109 Representative spoke with at [place] Arrangements/Services Living arrangements for the past 2 months: [place] Lives with:: Self, Spouse Patient language and need for interpreter reviewed:: Yes Do you feel safe going back to the place where you live?: Yes      Need for Family Participation in Patient Care: Yes (Comment) Care giver support system in place?: Yes (comment) Current home services: DME (walker) Criminal Activity/Legal Involvement Pertinent to Current Situation/Hospitalization: No - Comment as needed  Activities of Daily Living Home Assistive Devices/Equipment: Eyeglasses ADL Screening (condition at time of admission) Patient's cognitive ability adequate to safely complete daily activities?: Yes Is the patient deaf or have difficulty hearing?: No Does the patient have difficulty seeing, even when wearing glasses/contacts?: No Does the patient have difficulty concentrating, remembering, or making decisions?: No Patient able to express need for assistance with ADLs?: Yes Does the patient have difficulty dressing or bathing?: No Independently performs ADLs?: No Communication: Independent Dressing (OT): Needs assistance Is this a change from baseline?: Change from baseline, expected to last <3days Grooming: Needs assistance Is this a change from baseline?: Change from baseline, expected to last <3 days Feeding: Independent Bathing: Needs assistance Is this a change from baseline?: Change from baseline, expected to last <3 days Toileting: Needs assistance Is this a change from baseline?: Change from baseline, expected to last <3 days In/Out Bed: Needs assistance Is this a change from baseline?: Change from baseline, expected to last <3 days Walks in Home: Independent  Does the patient have difficulty walking or climbing stairs?: No Weakness of Legs: Both Weakness of Arms/Hands:  None  Permission Sought/Granted Permission sought to share information with : Family Supports    Share Information with NAME: Casey Lee     Permission granted to share info w Relationship: son  Permission granted to share info w Contact Information: [phone]  Emotional Assessment         Alcohol / Substance Use: Not Applicable Psych Involvement: No (comment)  Admission diagnosis:  Sepsis due to undetermined organism ([place]) [A41.9] Sepsis without acute organ dysfunction, due to unspecified organism [facility]) [A41.9] Patient Active Problem List   Diagnosis Date Noted   Sepsis due to undetermined organism ([place]) 03/30/2021   PAF (paroxysmal atrial fibrillation) ([place]) 03/30/2021   Right knee pain 05/12/2018   Acute renal failure superimposed on stage 3 chronic kidney disease ([place]) 05/11/2018   Septic prepatellar bursitis of right knee 05/11/2018   Temporal arteritis ([place])    HLD (hyperlipidemia) 03/19/2016   SDH (subdural hematoma) ([place]) 01/28/2016   BPPV (benign paroxysmal positional vertigo) 01/28/2016   Cerebral infarction ([place]) - incidental, punctate R periventricular infarct 01/28/2016   Essential hypertension 01/28/2016   Hyperlipidemia LDL goal <70 01/28/2016   Acute encephalopathy    Traumatic hemorrhage of cerebrum with loss of consciousness ([place]) - tiny L caudate 01/24/2016   PCP:  Casey Kraft, MD Pharmacy:   [facility], [place] [address] Phone: [phone] Fax: [phone]     Social Determinants of Health (SDOH) Interventions    Readmission Risk Interventions No flowsheet data found.

## 2021-04-07 DIAGNOSIS — R42 Dizziness and giddiness: Secondary | ICD-10-CM | POA: Diagnosis not present

## 2021-04-07 DIAGNOSIS — I7789 Other specified disorders of arteries and arterioles: Secondary | ICD-10-CM | POA: Diagnosis not present

## 2021-04-07 DIAGNOSIS — U071 COVID-19: Secondary | ICD-10-CM | POA: Diagnosis not present

## 2021-04-07 DIAGNOSIS — I480 Paroxysmal atrial fibrillation: Secondary | ICD-10-CM | POA: Diagnosis not present

## 2021-04-07 DIAGNOSIS — N390 Urinary tract infection, site not specified: Secondary | ICD-10-CM | POA: Diagnosis not present

## 2021-04-07 DIAGNOSIS — A419 Sepsis, unspecified organism: Secondary | ICD-10-CM | POA: Diagnosis not present

## 2021-04-07 DIAGNOSIS — E785 Hyperlipidemia, unspecified: Secondary | ICD-10-CM | POA: Diagnosis not present

## 2021-04-07 DIAGNOSIS — Z8673 Personal history of transient ischemic attack (TIA), and cerebral infarction without residual deficits: Secondary | ICD-10-CM | POA: Diagnosis not present

## 2021-04-07 DIAGNOSIS — Z741 Need for assistance with personal care: Secondary | ICD-10-CM | POA: Diagnosis not present

## 2021-04-07 DIAGNOSIS — N1832 Chronic kidney disease, stage 3b: Secondary | ICD-10-CM | POA: Diagnosis not present

## 2021-04-08 DIAGNOSIS — A419 Sepsis, unspecified organism: Secondary | ICD-10-CM | POA: Diagnosis not present

## 2021-04-08 DIAGNOSIS — I480 Paroxysmal atrial fibrillation: Secondary | ICD-10-CM | POA: Diagnosis not present

## 2021-04-08 DIAGNOSIS — I7789 Other specified disorders of arteries and arterioles: Secondary | ICD-10-CM | POA: Diagnosis not present

## 2021-04-08 DIAGNOSIS — U071 COVID-19: Secondary | ICD-10-CM | POA: Diagnosis not present

## 2021-04-08 DIAGNOSIS — N390 Urinary tract infection, site not specified: Secondary | ICD-10-CM | POA: Diagnosis not present

## 2021-04-08 DIAGNOSIS — E785 Hyperlipidemia, unspecified: Secondary | ICD-10-CM | POA: Diagnosis not present

## 2021-04-09 DIAGNOSIS — U071 COVID-19: Secondary | ICD-10-CM | POA: Diagnosis not present

## 2021-04-09 DIAGNOSIS — I7789 Other specified disorders of arteries and arterioles: Secondary | ICD-10-CM | POA: Diagnosis not present

## 2021-04-09 DIAGNOSIS — A419 Sepsis, unspecified organism: Secondary | ICD-10-CM | POA: Diagnosis not present

## 2021-04-09 DIAGNOSIS — E785 Hyperlipidemia, unspecified: Secondary | ICD-10-CM | POA: Diagnosis not present

## 2021-04-09 DIAGNOSIS — I480 Paroxysmal atrial fibrillation: Secondary | ICD-10-CM | POA: Diagnosis not present

## 2021-04-09 DIAGNOSIS — N390 Urinary tract infection, site not specified: Secondary | ICD-10-CM | POA: Diagnosis not present

## 2021-04-13 DIAGNOSIS — I7789 Other specified disorders of arteries and arterioles: Secondary | ICD-10-CM | POA: Diagnosis not present

## 2021-04-13 DIAGNOSIS — N1832 Chronic kidney disease, stage 3b: Secondary | ICD-10-CM | POA: Diagnosis not present

## 2021-04-13 DIAGNOSIS — U071 COVID-19: Secondary | ICD-10-CM | POA: Diagnosis not present

## 2021-04-13 DIAGNOSIS — E785 Hyperlipidemia, unspecified: Secondary | ICD-10-CM | POA: Diagnosis not present

## 2021-04-13 DIAGNOSIS — N390 Urinary tract infection, site not specified: Secondary | ICD-10-CM | POA: Diagnosis not present

## 2021-04-13 DIAGNOSIS — R42 Dizziness and giddiness: Secondary | ICD-10-CM | POA: Diagnosis not present

## 2021-04-13 DIAGNOSIS — A419 Sepsis, unspecified organism: Secondary | ICD-10-CM | POA: Diagnosis not present

## 2021-04-13 DIAGNOSIS — I480 Paroxysmal atrial fibrillation: Secondary | ICD-10-CM | POA: Diagnosis not present

## 2021-04-15 DIAGNOSIS — U071 COVID-19: Secondary | ICD-10-CM | POA: Diagnosis not present

## 2021-04-15 DIAGNOSIS — E785 Hyperlipidemia, unspecified: Secondary | ICD-10-CM | POA: Diagnosis not present

## 2021-04-15 DIAGNOSIS — A419 Sepsis, unspecified organism: Secondary | ICD-10-CM | POA: Diagnosis not present

## 2021-04-15 DIAGNOSIS — I480 Paroxysmal atrial fibrillation: Secondary | ICD-10-CM | POA: Diagnosis not present

## 2021-04-15 DIAGNOSIS — N390 Urinary tract infection, site not specified: Secondary | ICD-10-CM | POA: Diagnosis not present

## 2021-04-15 DIAGNOSIS — I7789 Other specified disorders of arteries and arterioles: Secondary | ICD-10-CM | POA: Diagnosis not present

## 2021-04-16 DIAGNOSIS — I4821 Permanent atrial fibrillation: Secondary | ICD-10-CM | POA: Diagnosis not present

## 2021-04-16 DIAGNOSIS — I480 Paroxysmal atrial fibrillation: Secondary | ICD-10-CM | POA: Diagnosis not present

## 2021-04-16 DIAGNOSIS — R3912 Poor urinary stream: Secondary | ICD-10-CM | POA: Diagnosis not present

## 2021-04-16 DIAGNOSIS — I7789 Other specified disorders of arteries and arterioles: Secondary | ICD-10-CM | POA: Diagnosis not present

## 2021-04-16 DIAGNOSIS — E785 Hyperlipidemia, unspecified: Secondary | ICD-10-CM | POA: Diagnosis not present

## 2021-04-16 DIAGNOSIS — N1831 Chronic kidney disease, stage 3a: Secondary | ICD-10-CM | POA: Diagnosis not present

## 2021-04-16 DIAGNOSIS — Z7901 Long term (current) use of anticoagulants: Secondary | ICD-10-CM | POA: Diagnosis not present

## 2021-04-16 DIAGNOSIS — A419 Sepsis, unspecified organism: Secondary | ICD-10-CM | POA: Diagnosis not present

## 2021-04-16 DIAGNOSIS — U071 COVID-19: Secondary | ICD-10-CM | POA: Diagnosis not present

## 2021-04-16 DIAGNOSIS — N390 Urinary tract infection, site not specified: Secondary | ICD-10-CM | POA: Diagnosis not present

## 2021-04-16 DIAGNOSIS — N401 Enlarged prostate with lower urinary tract symptoms: Secondary | ICD-10-CM | POA: Diagnosis not present

## 2021-04-16 DIAGNOSIS — R531 Weakness: Secondary | ICD-10-CM | POA: Diagnosis not present

## 2021-04-19 DIAGNOSIS — N390 Urinary tract infection, site not specified: Secondary | ICD-10-CM | POA: Diagnosis not present

## 2021-04-19 DIAGNOSIS — E785 Hyperlipidemia, unspecified: Secondary | ICD-10-CM | POA: Diagnosis not present

## 2021-04-19 DIAGNOSIS — A419 Sepsis, unspecified organism: Secondary | ICD-10-CM | POA: Diagnosis not present

## 2021-04-19 DIAGNOSIS — U071 COVID-19: Secondary | ICD-10-CM | POA: Diagnosis not present

## 2021-04-19 DIAGNOSIS — I480 Paroxysmal atrial fibrillation: Secondary | ICD-10-CM | POA: Diagnosis not present

## 2021-04-19 DIAGNOSIS — I7789 Other specified disorders of arteries and arterioles: Secondary | ICD-10-CM | POA: Diagnosis not present

## 2021-04-23 DIAGNOSIS — N390 Urinary tract infection, site not specified: Secondary | ICD-10-CM | POA: Diagnosis not present

## 2021-04-23 DIAGNOSIS — A419 Sepsis, unspecified organism: Secondary | ICD-10-CM | POA: Diagnosis not present

## 2021-04-23 DIAGNOSIS — I480 Paroxysmal atrial fibrillation: Secondary | ICD-10-CM | POA: Diagnosis not present

## 2021-04-23 DIAGNOSIS — E785 Hyperlipidemia, unspecified: Secondary | ICD-10-CM | POA: Diagnosis not present

## 2021-04-23 DIAGNOSIS — U071 COVID-19: Secondary | ICD-10-CM | POA: Diagnosis not present

## 2021-04-23 DIAGNOSIS — I7789 Other specified disorders of arteries and arterioles: Secondary | ICD-10-CM | POA: Diagnosis not present

## 2021-04-27 DIAGNOSIS — A419 Sepsis, unspecified organism: Secondary | ICD-10-CM | POA: Diagnosis not present

## 2021-04-27 DIAGNOSIS — I7789 Other specified disorders of arteries and arterioles: Secondary | ICD-10-CM | POA: Diagnosis not present

## 2021-04-27 DIAGNOSIS — N390 Urinary tract infection, site not specified: Secondary | ICD-10-CM | POA: Diagnosis not present

## 2021-04-27 DIAGNOSIS — I480 Paroxysmal atrial fibrillation: Secondary | ICD-10-CM | POA: Diagnosis not present

## 2021-04-27 DIAGNOSIS — E785 Hyperlipidemia, unspecified: Secondary | ICD-10-CM | POA: Diagnosis not present

## 2021-04-27 DIAGNOSIS — U071 COVID-19: Secondary | ICD-10-CM | POA: Diagnosis not present

## 2021-04-29 DIAGNOSIS — E785 Hyperlipidemia, unspecified: Secondary | ICD-10-CM | POA: Diagnosis not present

## 2021-04-29 DIAGNOSIS — I7789 Other specified disorders of arteries and arterioles: Secondary | ICD-10-CM | POA: Diagnosis not present

## 2021-04-29 DIAGNOSIS — U071 COVID-19: Secondary | ICD-10-CM | POA: Diagnosis not present

## 2021-04-29 DIAGNOSIS — N390 Urinary tract infection, site not specified: Secondary | ICD-10-CM | POA: Diagnosis not present

## 2021-04-29 DIAGNOSIS — A419 Sepsis, unspecified organism: Secondary | ICD-10-CM | POA: Diagnosis not present

## 2021-04-29 DIAGNOSIS — I480 Paroxysmal atrial fibrillation: Secondary | ICD-10-CM | POA: Diagnosis not present

## 2021-05-03 DIAGNOSIS — I480 Paroxysmal atrial fibrillation: Secondary | ICD-10-CM | POA: Diagnosis not present

## 2021-05-03 DIAGNOSIS — I7789 Other specified disorders of arteries and arterioles: Secondary | ICD-10-CM | POA: Diagnosis not present

## 2021-05-03 DIAGNOSIS — N390 Urinary tract infection, site not specified: Secondary | ICD-10-CM | POA: Diagnosis not present

## 2021-05-03 DIAGNOSIS — A419 Sepsis, unspecified organism: Secondary | ICD-10-CM | POA: Diagnosis not present

## 2021-05-03 DIAGNOSIS — E785 Hyperlipidemia, unspecified: Secondary | ICD-10-CM | POA: Diagnosis not present

## 2021-05-03 DIAGNOSIS — U071 COVID-19: Secondary | ICD-10-CM | POA: Diagnosis not present

## 2021-05-07 DIAGNOSIS — R42 Dizziness and giddiness: Secondary | ICD-10-CM | POA: Diagnosis not present

## 2021-05-07 DIAGNOSIS — I7789 Other specified disorders of arteries and arterioles: Secondary | ICD-10-CM | POA: Diagnosis not present

## 2021-05-07 DIAGNOSIS — N1832 Chronic kidney disease, stage 3b: Secondary | ICD-10-CM | POA: Diagnosis not present

## 2021-05-07 DIAGNOSIS — U071 COVID-19: Secondary | ICD-10-CM | POA: Diagnosis not present

## 2021-05-07 DIAGNOSIS — E785 Hyperlipidemia, unspecified: Secondary | ICD-10-CM | POA: Diagnosis not present

## 2021-05-07 DIAGNOSIS — N390 Urinary tract infection, site not specified: Secondary | ICD-10-CM | POA: Diagnosis not present

## 2021-05-07 DIAGNOSIS — I480 Paroxysmal atrial fibrillation: Secondary | ICD-10-CM | POA: Diagnosis not present

## 2021-05-07 DIAGNOSIS — Z741 Need for assistance with personal care: Secondary | ICD-10-CM | POA: Diagnosis not present

## 2021-05-07 DIAGNOSIS — A419 Sepsis, unspecified organism: Secondary | ICD-10-CM | POA: Diagnosis not present

## 2021-05-07 DIAGNOSIS — Z8673 Personal history of transient ischemic attack (TIA), and cerebral infarction without residual deficits: Secondary | ICD-10-CM | POA: Diagnosis not present

## 2021-05-10 DIAGNOSIS — N390 Urinary tract infection, site not specified: Secondary | ICD-10-CM | POA: Diagnosis not present

## 2021-05-10 DIAGNOSIS — A419 Sepsis, unspecified organism: Secondary | ICD-10-CM | POA: Diagnosis not present

## 2021-05-10 DIAGNOSIS — I480 Paroxysmal atrial fibrillation: Secondary | ICD-10-CM | POA: Diagnosis not present

## 2021-05-10 DIAGNOSIS — I7789 Other specified disorders of arteries and arterioles: Secondary | ICD-10-CM | POA: Diagnosis not present

## 2021-05-10 DIAGNOSIS — U071 COVID-19: Secondary | ICD-10-CM | POA: Diagnosis not present

## 2021-05-10 DIAGNOSIS — E785 Hyperlipidemia, unspecified: Secondary | ICD-10-CM | POA: Diagnosis not present

## 2021-05-18 DIAGNOSIS — I480 Paroxysmal atrial fibrillation: Secondary | ICD-10-CM | POA: Diagnosis not present

## 2021-05-18 DIAGNOSIS — E785 Hyperlipidemia, unspecified: Secondary | ICD-10-CM | POA: Diagnosis not present

## 2021-05-18 DIAGNOSIS — N390 Urinary tract infection, site not specified: Secondary | ICD-10-CM | POA: Diagnosis not present

## 2021-05-18 DIAGNOSIS — I7789 Other specified disorders of arteries and arterioles: Secondary | ICD-10-CM | POA: Diagnosis not present

## 2021-05-18 DIAGNOSIS — A419 Sepsis, unspecified organism: Secondary | ICD-10-CM | POA: Diagnosis not present

## 2021-05-18 DIAGNOSIS — U071 COVID-19: Secondary | ICD-10-CM | POA: Diagnosis not present

## 2021-05-24 DIAGNOSIS — I4821 Permanent atrial fibrillation: Secondary | ICD-10-CM | POA: Diagnosis not present

## 2021-05-24 DIAGNOSIS — N1831 Chronic kidney disease, stage 3a: Secondary | ICD-10-CM | POA: Diagnosis not present

## 2021-05-24 DIAGNOSIS — Z7901 Long term (current) use of anticoagulants: Secondary | ICD-10-CM | POA: Diagnosis not present

## 2021-05-25 DIAGNOSIS — U071 COVID-19: Secondary | ICD-10-CM | POA: Diagnosis not present

## 2021-05-25 DIAGNOSIS — E785 Hyperlipidemia, unspecified: Secondary | ICD-10-CM | POA: Diagnosis not present

## 2021-05-25 DIAGNOSIS — A419 Sepsis, unspecified organism: Secondary | ICD-10-CM | POA: Diagnosis not present

## 2021-05-25 DIAGNOSIS — N390 Urinary tract infection, site not specified: Secondary | ICD-10-CM | POA: Diagnosis not present

## 2021-05-25 DIAGNOSIS — I480 Paroxysmal atrial fibrillation: Secondary | ICD-10-CM | POA: Diagnosis not present

## 2021-05-25 DIAGNOSIS — I7789 Other specified disorders of arteries and arterioles: Secondary | ICD-10-CM | POA: Diagnosis not present

## 2021-05-26 DIAGNOSIS — Z0000 Encounter for general adult medical examination without abnormal findings: Secondary | ICD-10-CM | POA: Diagnosis not present

## 2021-05-31 DIAGNOSIS — N390 Urinary tract infection, site not specified: Secondary | ICD-10-CM | POA: Diagnosis not present

## 2021-05-31 DIAGNOSIS — D692 Other nonthrombocytopenic purpura: Secondary | ICD-10-CM | POA: Diagnosis not present

## 2021-05-31 DIAGNOSIS — I4821 Permanent atrial fibrillation: Secondary | ICD-10-CM | POA: Diagnosis not present

## 2021-05-31 DIAGNOSIS — U071 COVID-19: Secondary | ICD-10-CM | POA: Diagnosis not present

## 2021-05-31 DIAGNOSIS — E785 Hyperlipidemia, unspecified: Secondary | ICD-10-CM | POA: Diagnosis not present

## 2021-05-31 DIAGNOSIS — R0989 Other specified symptoms and signs involving the circulatory and respiratory systems: Secondary | ICD-10-CM | POA: Diagnosis not present

## 2021-05-31 DIAGNOSIS — I480 Paroxysmal atrial fibrillation: Secondary | ICD-10-CM | POA: Diagnosis not present

## 2021-05-31 DIAGNOSIS — I7789 Other specified disorders of arteries and arterioles: Secondary | ICD-10-CM | POA: Diagnosis not present

## 2021-05-31 DIAGNOSIS — I700 Atherosclerosis of aorta: Secondary | ICD-10-CM | POA: Diagnosis not present

## 2021-05-31 DIAGNOSIS — E782 Mixed hyperlipidemia: Secondary | ICD-10-CM | POA: Diagnosis not present

## 2021-05-31 DIAGNOSIS — R011 Cardiac murmur, unspecified: Secondary | ICD-10-CM | POA: Diagnosis not present

## 2021-05-31 DIAGNOSIS — E1165 Type 2 diabetes mellitus with hyperglycemia: Secondary | ICD-10-CM | POA: Diagnosis not present

## 2021-05-31 DIAGNOSIS — Z7901 Long term (current) use of anticoagulants: Secondary | ICD-10-CM | POA: Diagnosis not present

## 2021-05-31 DIAGNOSIS — A419 Sepsis, unspecified organism: Secondary | ICD-10-CM | POA: Diagnosis not present

## 2021-05-31 DIAGNOSIS — N1831 Chronic kidney disease, stage 3a: Secondary | ICD-10-CM | POA: Diagnosis not present

## 2021-06-02 DIAGNOSIS — Z8616 Personal history of COVID-19: Secondary | ICD-10-CM | POA: Diagnosis not present

## 2021-06-02 DIAGNOSIS — H26492 Other secondary cataract, left eye: Secondary | ICD-10-CM | POA: Diagnosis not present

## 2021-06-02 DIAGNOSIS — Z961 Presence of intraocular lens: Secondary | ICD-10-CM | POA: Diagnosis not present

## 2021-06-02 DIAGNOSIS — H30141 Acute posterior multifocal placoid pigment epitheliopathy, right eye: Secondary | ICD-10-CM | POA: Diagnosis not present

## 2021-06-10 DIAGNOSIS — R011 Cardiac murmur, unspecified: Secondary | ICD-10-CM | POA: Diagnosis not present

## 2021-06-10 DIAGNOSIS — R0989 Other specified symptoms and signs involving the circulatory and respiratory systems: Secondary | ICD-10-CM | POA: Diagnosis not present

## 2021-06-11 DIAGNOSIS — N401 Enlarged prostate with lower urinary tract symptoms: Secondary | ICD-10-CM | POA: Diagnosis not present

## 2021-06-11 DIAGNOSIS — R3912 Poor urinary stream: Secondary | ICD-10-CM | POA: Diagnosis not present

## 2021-06-28 DIAGNOSIS — M810 Age-related osteoporosis without current pathological fracture: Secondary | ICD-10-CM | POA: Diagnosis not present

## 2021-06-28 DIAGNOSIS — R768 Other specified abnormal immunological findings in serum: Secondary | ICD-10-CM | POA: Diagnosis not present

## 2021-06-28 DIAGNOSIS — R269 Unspecified abnormalities of gait and mobility: Secondary | ICD-10-CM | POA: Diagnosis not present

## 2021-06-28 DIAGNOSIS — Z7952 Long term (current) use of systemic steroids: Secondary | ICD-10-CM | POA: Diagnosis not present

## 2021-06-28 DIAGNOSIS — N189 Chronic kidney disease, unspecified: Secondary | ICD-10-CM | POA: Diagnosis not present

## 2021-06-28 DIAGNOSIS — Z8679 Personal history of other diseases of the circulatory system: Secondary | ICD-10-CM | POA: Diagnosis not present

## 2021-07-09 DIAGNOSIS — H5000 Unspecified esotropia: Secondary | ICD-10-CM | POA: Diagnosis not present

## 2021-07-09 DIAGNOSIS — H532 Diplopia: Secondary | ICD-10-CM | POA: Diagnosis not present

## 2021-07-13 DIAGNOSIS — Z961 Presence of intraocular lens: Secondary | ICD-10-CM | POA: Diagnosis not present

## 2021-07-13 DIAGNOSIS — H00025 Hordeolum internum left lower eyelid: Secondary | ICD-10-CM | POA: Diagnosis not present

## 2021-07-13 DIAGNOSIS — H5000 Unspecified esotropia: Secondary | ICD-10-CM | POA: Diagnosis not present

## 2021-07-13 DIAGNOSIS — H532 Diplopia: Secondary | ICD-10-CM | POA: Diagnosis not present

## 2021-07-13 DIAGNOSIS — H5021 Vertical strabismus, right eye: Secondary | ICD-10-CM | POA: Diagnosis not present

## 2021-07-24 DIAGNOSIS — Z23 Encounter for immunization: Secondary | ICD-10-CM | POA: Diagnosis not present

## 2021-08-28 ENCOUNTER — Encounter (HOSPITAL_COMMUNITY): Payer: Self-pay

## 2021-08-28 ENCOUNTER — Inpatient Hospital Stay (HOSPITAL_COMMUNITY)
Admission: EM | Admit: 2021-08-28 | Discharge: 2021-09-06 | DRG: 871 | Disposition: A | Discharge status: Skilled Nursing Facility | Payer: Medicare Other | Attending: Internal Medicine | Admitting: Internal Medicine

## 2021-08-28 ENCOUNTER — Other Ambulatory Visit: Payer: Self-pay

## 2021-08-28 ENCOUNTER — Emergency Department (HOSPITAL_COMMUNITY): Payer: Medicare Other

## 2021-08-28 DIAGNOSIS — W19XXXA Unspecified fall, initial encounter: Secondary | ICD-10-CM

## 2021-08-28 DIAGNOSIS — T380X5A Adverse effect of glucocorticoids and synthetic analogues, initial encounter: Secondary | ICD-10-CM | POA: Diagnosis present

## 2021-08-28 DIAGNOSIS — D696 Thrombocytopenia, unspecified: Secondary | ICD-10-CM | POA: Diagnosis not present

## 2021-08-28 DIAGNOSIS — E785 Hyperlipidemia, unspecified: Secondary | ICD-10-CM | POA: Diagnosis not present

## 2021-08-28 DIAGNOSIS — R6520 Severe sepsis without septic shock: Secondary | ICD-10-CM | POA: Diagnosis present

## 2021-08-28 DIAGNOSIS — E7800 Pure hypercholesterolemia, unspecified: Secondary | ICD-10-CM | POA: Diagnosis present

## 2021-08-28 DIAGNOSIS — J101 Influenza due to other identified influenza virus with other respiratory manifestations: Secondary | ICD-10-CM | POA: Diagnosis not present

## 2021-08-28 DIAGNOSIS — R0689 Other abnormalities of breathing: Secondary | ICD-10-CM | POA: Diagnosis not present

## 2021-08-28 DIAGNOSIS — J1100 Influenza due to unidentified influenza virus with unspecified type of pneumonia: Secondary | ICD-10-CM | POA: Diagnosis not present

## 2021-08-28 DIAGNOSIS — I10 Essential (primary) hypertension: Secondary | ICD-10-CM | POA: Diagnosis not present

## 2021-08-28 DIAGNOSIS — J9601 Acute respiratory failure with hypoxia: Secondary | ICD-10-CM | POA: Diagnosis present

## 2021-08-28 DIAGNOSIS — R739 Hyperglycemia, unspecified: Secondary | ICD-10-CM | POA: Diagnosis present

## 2021-08-28 DIAGNOSIS — M19011 Primary osteoarthritis, right shoulder: Secondary | ICD-10-CM | POA: Diagnosis not present

## 2021-08-28 DIAGNOSIS — A419 Sepsis, unspecified organism: Principal | ICD-10-CM | POA: Diagnosis present

## 2021-08-28 DIAGNOSIS — Z8261 Family history of arthritis: Secondary | ICD-10-CM | POA: Diagnosis not present

## 2021-08-28 DIAGNOSIS — J111 Influenza due to unidentified influenza virus with other respiratory manifestations: Secondary | ICD-10-CM

## 2021-08-28 DIAGNOSIS — Z20822 Contact with and (suspected) exposure to covid-19: Secondary | ICD-10-CM | POA: Diagnosis present

## 2021-08-28 DIAGNOSIS — I429 Cardiomyopathy, unspecified: Secondary | ICD-10-CM | POA: Diagnosis present

## 2021-08-28 DIAGNOSIS — I129 Hypertensive chronic kidney disease with stage 1 through stage 4 chronic kidney disease, or unspecified chronic kidney disease: Secondary | ICD-10-CM | POA: Diagnosis present

## 2021-08-28 DIAGNOSIS — R7881 Bacteremia: Secondary | ICD-10-CM | POA: Diagnosis not present

## 2021-08-28 DIAGNOSIS — R531 Weakness: Secondary | ICD-10-CM | POA: Diagnosis not present

## 2021-08-28 DIAGNOSIS — Z87891 Personal history of nicotine dependence: Secondary | ICD-10-CM | POA: Diagnosis not present

## 2021-08-28 DIAGNOSIS — N1831 Chronic kidney disease, stage 3a: Secondary | ICD-10-CM | POA: Diagnosis present

## 2021-08-28 DIAGNOSIS — S4991XA Unspecified injury of right shoulder and upper arm, initial encounter: Secondary | ICD-10-CM | POA: Diagnosis not present

## 2021-08-28 DIAGNOSIS — B9689 Other specified bacterial agents as the cause of diseases classified elsewhere: Secondary | ICD-10-CM | POA: Diagnosis present

## 2021-08-28 DIAGNOSIS — E876 Hypokalemia: Secondary | ICD-10-CM | POA: Diagnosis present

## 2021-08-28 DIAGNOSIS — Z8616 Personal history of COVID-19: Secondary | ICD-10-CM | POA: Diagnosis not present

## 2021-08-28 DIAGNOSIS — M19012 Primary osteoarthritis, left shoulder: Secondary | ICD-10-CM | POA: Diagnosis not present

## 2021-08-28 DIAGNOSIS — G9341 Metabolic encephalopathy: Secondary | ICD-10-CM | POA: Diagnosis present

## 2021-08-28 DIAGNOSIS — Z888 Allergy status to other drugs, medicaments and biological substances status: Secondary | ICD-10-CM | POA: Diagnosis not present

## 2021-08-28 DIAGNOSIS — J189 Pneumonia, unspecified organism: Secondary | ICD-10-CM | POA: Diagnosis not present

## 2021-08-28 DIAGNOSIS — J811 Chronic pulmonary edema: Secondary | ICD-10-CM | POA: Diagnosis not present

## 2021-08-28 DIAGNOSIS — R0602 Shortness of breath: Secondary | ICD-10-CM

## 2021-08-28 DIAGNOSIS — M85812 Other specified disorders of bone density and structure, left shoulder: Secondary | ICD-10-CM | POA: Diagnosis not present

## 2021-08-28 DIAGNOSIS — J1108 Influenza due to unidentified influenza virus with specified pneumonia: Secondary | ICD-10-CM | POA: Diagnosis not present

## 2021-08-28 DIAGNOSIS — Z7401 Bed confinement status: Secondary | ICD-10-CM | POA: Diagnosis not present

## 2021-08-28 DIAGNOSIS — Y92239 Unspecified place in hospital as the place of occurrence of the external cause: Secondary | ICD-10-CM | POA: Diagnosis present

## 2021-08-28 DIAGNOSIS — Z833 Family history of diabetes mellitus: Secondary | ICD-10-CM

## 2021-08-28 DIAGNOSIS — I4819 Other persistent atrial fibrillation: Secondary | ICD-10-CM | POA: Diagnosis present

## 2021-08-28 DIAGNOSIS — I480 Paroxysmal atrial fibrillation: Secondary | ICD-10-CM | POA: Diagnosis not present

## 2021-08-28 DIAGNOSIS — B9562 Methicillin resistant Staphylococcus aureus infection as the cause of diseases classified elsewhere: Secondary | ICD-10-CM | POA: Diagnosis present

## 2021-08-28 DIAGNOSIS — I700 Atherosclerosis of aorta: Secondary | ICD-10-CM | POA: Diagnosis not present

## 2021-08-28 DIAGNOSIS — R509 Fever, unspecified: Secondary | ICD-10-CM | POA: Diagnosis not present

## 2021-08-28 DIAGNOSIS — E569 Vitamin deficiency, unspecified: Secondary | ICD-10-CM | POA: Diagnosis not present

## 2021-08-28 DIAGNOSIS — Z8249 Family history of ischemic heart disease and other diseases of the circulatory system: Secondary | ICD-10-CM

## 2021-08-28 DIAGNOSIS — M316 Other giant cell arteritis: Secondary | ICD-10-CM | POA: Diagnosis not present

## 2021-08-28 DIAGNOSIS — S4992XA Unspecified injury of left shoulder and upper arm, initial encounter: Secondary | ICD-10-CM | POA: Diagnosis not present

## 2021-08-28 DIAGNOSIS — I4891 Unspecified atrial fibrillation: Secondary | ICD-10-CM | POA: Diagnosis not present

## 2021-08-28 DIAGNOSIS — I4892 Unspecified atrial flutter: Secondary | ICD-10-CM | POA: Diagnosis not present

## 2021-08-28 DIAGNOSIS — R059 Cough, unspecified: Secondary | ICD-10-CM | POA: Diagnosis not present

## 2021-08-28 DIAGNOSIS — Z7901 Long term (current) use of anticoagulants: Secondary | ICD-10-CM

## 2021-08-28 DIAGNOSIS — Z79899 Other long term (current) drug therapy: Secondary | ICD-10-CM

## 2021-08-28 DIAGNOSIS — J1000 Influenza due to other identified influenza virus with unspecified type of pneumonia: Secondary | ICD-10-CM | POA: Diagnosis not present

## 2021-08-28 DIAGNOSIS — J90 Pleural effusion, not elsewhere classified: Secondary | ICD-10-CM | POA: Diagnosis not present

## 2021-08-28 DIAGNOSIS — Z8673 Personal history of transient ischemic attack (TIA), and cerebral infarction without residual deficits: Secondary | ICD-10-CM

## 2021-08-28 DIAGNOSIS — R5383 Other fatigue: Secondary | ICD-10-CM | POA: Diagnosis not present

## 2021-08-28 DIAGNOSIS — B957 Other staphylococcus as the cause of diseases classified elsewhere: Secondary | ICD-10-CM | POA: Diagnosis present

## 2021-08-28 DIAGNOSIS — M6281 Muscle weakness (generalized): Secondary | ICD-10-CM | POA: Diagnosis not present

## 2021-08-28 DIAGNOSIS — R000 Tachycardia, unspecified: Secondary | ICD-10-CM | POA: Diagnosis not present

## 2021-08-28 DIAGNOSIS — R4182 Altered mental status, unspecified: Secondary | ICD-10-CM | POA: Diagnosis not present

## 2021-08-28 LAB — CBC WITH DIFFERENTIAL/PLATELET
Abs Immature Granulocytes: 0.06 10*3/uL (ref 0.00–0.07)
Basophils Absolute: 0.1 10*3/uL (ref 0.0–0.1)
Basophils Relative: 1 %
Eosinophils Absolute: 0.2 10*3/uL (ref 0.0–0.5)
Eosinophils Relative: 2 %
HCT: 41.7 % (ref 39.0–52.0)
Hemoglobin: 13.5 g/dL (ref 13.0–17.0)
Immature Granulocytes: 1 %
Lymphocytes Relative: 15 %
Lymphs Abs: 1.5 10*3/uL (ref 0.7–4.0)
MCH: 30.3 pg (ref 26.0–34.0)
MCHC: 32.4 g/dL (ref 30.0–36.0)
MCV: 93.5 fL (ref 80.0–100.0)
Monocytes Absolute: 0.9 10*3/uL (ref 0.1–1.0)
Monocytes Relative: 9 %
Neutro Abs: 6.8 10*3/uL (ref 1.7–7.7)
Neutrophils Relative %: 72 %
Platelets: 145 10*3/uL — ABNORMAL LOW (ref 150–400)
RBC: 4.46 MIL/uL (ref 4.22–5.81)
RDW: 14.6 % (ref 11.5–15.5)
WBC: 9.4 10*3/uL (ref 4.0–10.5)
nRBC: 0 % (ref 0.0–0.2)

## 2021-08-28 LAB — COMPREHENSIVE METABOLIC PANEL
ALT: 18 U/L (ref 0–44)
AST: 21 U/L (ref 15–41)
Albumin: 3.5 g/dL (ref 3.5–5.0)
Alkaline Phosphatase: 50 U/L (ref 38–126)
Anion gap: 8 (ref 5–15)
BUN: 31 mg/dL — ABNORMAL HIGH (ref 8–23)
CO2: 20 mmol/L — ABNORMAL LOW (ref 22–32)
Calcium: 8.6 mg/dL — ABNORMAL LOW (ref 8.9–10.3)
Chloride: 109 mmol/L (ref 98–111)
Creatinine, Ser: 1.64 mg/dL — ABNORMAL HIGH (ref 0.61–1.24)
GFR, Estimated: 39 mL/min — ABNORMAL LOW (ref >60)
Glucose, Bld: 200 mg/dL — ABNORMAL HIGH (ref 70–99)
Potassium: 3.9 mmol/L (ref 3.5–5.1)
Sodium: 137 mmol/L (ref 135–145)
Total Bilirubin: 1 mg/dL (ref 0.3–1.2)
Total Protein: 6.3 g/dL — ABNORMAL LOW (ref 6.5–8.1)

## 2021-08-28 LAB — APTT: aPTT: 31 seconds (ref 24–36)

## 2021-08-28 LAB — RESP PANEL BY RT-PCR (FLU A&B, COVID) ARPGX2
Influenza A by PCR: POSITIVE — AB
Influenza B by PCR: NEGATIVE
SARS Coronavirus 2 by RT PCR: NEGATIVE

## 2021-08-28 LAB — LACTIC ACID, PLASMA: Lactic Acid, Venous: 2.3 mmol/L (ref 0.5–1.9)

## 2021-08-28 LAB — PROTIME-INR
INR: 1.2 (ref 0.8–1.2)
Prothrombin Time: 15.5 seconds — ABNORMAL HIGH (ref 11.4–15.2)

## 2021-08-28 MED ORDER — APIXABAN 2.5 MG PO TABS
2.5000 mg | ORAL_TABLET | Freq: Two times a day (BID) | ORAL | Status: DC
  Administered 2021-08-29 – 2021-09-06 (×19): 2.5 mg via ORAL
  Filled 2021-08-28 (×19): qty 1

## 2021-08-28 MED ORDER — SODIUM CHLORIDE 0.9 % IV SOLN
500.0000 mg | INTRAVENOUS | Status: DC
  Administered 2021-08-28 – 2021-08-29 (×2): 500 mg via INTRAVENOUS
  Filled 2021-08-28 (×2): qty 500

## 2021-08-28 MED ORDER — SODIUM CHLORIDE 0.9 % IV SOLN
2.0000 g | INTRAVENOUS | Status: DC
  Administered 2021-08-28 – 2021-08-29 (×2): 2 g via INTRAVENOUS
  Filled 2021-08-28 (×2): qty 20

## 2021-08-28 MED ORDER — ACETAMINOPHEN 325 MG PO TABS
650.0000 mg | ORAL_TABLET | Freq: Four times a day (QID) | ORAL | Status: DC | PRN
  Administered 2021-09-05: 650 mg via ORAL
  Filled 2021-08-28: qty 2

## 2021-08-28 MED ORDER — HYDROCORTISONE SOD SUC (PF) 100 MG IJ SOLR
50.0000 mg | Freq: Four times a day (QID) | INTRAMUSCULAR | Status: AC
  Administered 2021-08-28 – 2021-08-29 (×3): 50 mg via INTRAVENOUS
  Filled 2021-08-28 (×3): qty 2

## 2021-08-28 MED ORDER — LACTATED RINGERS IV BOLUS
1000.0000 mL | Freq: Once | INTRAVENOUS | Status: AC
  Administered 2021-08-28: 1000 mL via INTRAVENOUS

## 2021-08-28 MED ORDER — PREDNISOLONE 5 MG PO TABS
5.0000 mg | ORAL_TABLET | Freq: Every day | ORAL | Status: DC
  Filled 2021-08-28: qty 1

## 2021-08-28 MED ORDER — LACTATED RINGERS IV SOLN: INTRAVENOUS | Status: DC

## 2021-08-28 MED ORDER — OSELTAMIVIR PHOSPHATE 30 MG PO CAPS
30.0000 mg | ORAL_CAPSULE | Freq: Every day | ORAL | Status: DC
  Administered 2021-08-29 (×2): 30 mg via ORAL
  Filled 2021-08-28 (×2): qty 1

## 2021-08-28 MED ORDER — LORATADINE 10 MG PO TABS
10.0000 mg | ORAL_TABLET | Freq: Every day | ORAL | Status: DC
  Administered 2021-08-29 – 2021-09-06 (×9): 10 mg via ORAL
  Filled 2021-08-28 (×9): qty 1

## 2021-08-28 MED ORDER — ACETAMINOPHEN 650 MG RE SUPP: 650.0000 mg | Freq: Four times a day (QID) | RECTAL | Status: DC | PRN

## 2021-08-28 MED ORDER — ROSUVASTATIN CALCIUM 10 MG PO TABS
10.0000 mg | ORAL_TABLET | Freq: Every day | ORAL | Status: DC
  Administered 2021-08-29 – 2021-09-06 (×9): 10 mg via ORAL
  Filled 2021-08-28 (×9): qty 1

## 2021-08-28 NOTE — ED Notes (Signed)
PrimoFit placed on pt to collect UA Will continue to monitor

## 2021-08-28 NOTE — Progress Notes (Signed)
[facility] asked to order Tamiflu for this patient who is Influenza A +  Last documented Weight = 67.1 kg Age = 89 yr SCr = 1.64  Estimated CrCl = 27 ml/min  Plan:  Tamiflu 30mg  po daily x 5 days (dose recommended for CrCl 10-30 ml/min)  Leone Haven, PharmD 08/28/21 @ 23:55

## 2021-08-28 NOTE — ED Triage Notes (Signed)
Patient arrives from home via EMS with complaint of flu symptoms. Pt was diagnosed with the flu today. EMS reports orthostatic changes and AMS when standing. 1 G tylenol and 1 L NS given en route.  18 g RAC 20 g LAC   BP 150/110 HR 110 T 100 ETCO2 31

## 2021-08-28 NOTE — ED Notes (Signed)
ED TO INPATIENT HANDOFF REPORT  ED Nurse Name and Phone #: [phone] Erick Colace, RN   S Name/Age/Gender Casey Lee 87 y.o. male Room/Bed: WA13/WA13  Code Status   Code Status: Prior  Home/SNF/Other Home Patient oriented to: self and place Is this baseline? No   Triage Complete: Triage complete  Chief Complaint CAP (community acquired pneumonia) [J18.9]  Triage Note Patient arrives from home via EMS with complaint of flu symptoms. Pt was diagnosed with the flu today. EMS reports orthostatic changes and AMS when standing. 1 G tylenol and 1 L NS given en route.  18 g RAC 20 g LAC   BP 150/110 HR 110 T 100 ETCO2 31   Allergies Allergies  Allergen Reactions   Fosamax [Alendronate] Other (See Comments)    SEVERE JAW(BONE) PAIN   Clopidogrel     Other reaction(s): Other (See Comments)    Level of Care/Admitting Diagnosis ED Disposition     ED Disposition  Admit   Condition  --   Comment  Hospital Area: [facility] [[id number]]  Level of Care: Telemetry [5]  Admit to tele based on following criteria: Monitor for Ischemic changes  May admit patient to [facility] or [facility] if equivalent level of care is available:: Yes  Covid Evaluation: Symptomatic Person Under Investigation (PUI)  Diagnosis: CAP (community acquired pneumonia) [025852]  Admitting Physician: Rise Patience [phone]  Attending Physician: Rise Patience [phone]  Estimated length of stay: past midnight tomorrow  Certification:: I certify this patient will need inpatient services for at least 2 midnights          B Medical/Surgery History Past Medical History:  Diagnosis Date   Atrial fibrillation ([place])    Hearing loss    High cholesterol    Temporal arteritis ([place])    TIA (transient ischemic attack)    UTI (lower urinary tract infection)    Vertigo    Past Surgical History:  Procedure Laterality Date   ARTERY BIOPSY Right 04/30/2018   Procedure:  BIOPSY TEMPORAL ARTERY;  Surgeon: Izora Gala, MD;  Location: [facility];  Service: ENT;  Laterality: Right;   CATARACT EXTRACTION Right 04/28/2020   I & D EXTREMITY Right 05/12/2018   Procedure: IRRIGATION AND DEBRIDEMENT RIGHT KNEE BURSA;  Surgeon: Marchia Bond, MD;  Location: [facility];  Service: Orthopedics;  Laterality: Right;   none       A IV Location/Drains/Wounds Patient Lines/Drains/Airways Status     Active Line/Drains/Airways     Name Placement date Placement time Site Days   Peripheral IV 08/28/21 18 G Right Antecubital 08/28/21  2135  Antecubital  less than 1   Peripheral IV 08/28/21 20 G Left Antecubital 08/28/21  2135  Antecubital  less than 1   External Urinary Catheter 04/02/21  0800  --  148   Incision (Closed) 04/30/18 Face Right 04/30/18  1003  -- 1216   Incision (Closed) 05/12/18 Knee Right 05/12/18  1552  -- 1204   Wound / Incision (Open or Dehisced) 01/25/16 Other (Comment) Head Posterior;Mid Superficial abrasion 01/25/16  0207  Head  2042            Intake/Output Last 24 hours  Intake/Output Summary (Last 24 hours) at 08/28/2021 2330 Last data filed at 08/28/2021 2304 Gross per 24 hour  Intake 100 ml  Output --  Net 100 ml    Labs/Imaging Results for orders placed or performed during the hospital encounter of 08/28/21 (from the past  48 hour(s))  Resp Panel by RT-PCR (Flu A&B, Covid) Nasopharyngeal Swab     Status: Abnormal   Collection Time: 08/28/21  9:32 PM   Specimen: Nasopharyngeal Swab; Nasopharyngeal(NP) swabs in vial transport medium  Result Value Ref Range   SARS Coronavirus 2 by RT PCR NEGATIVE NEGATIVE    Comment: (NOTE) SARS-CoV-2 target nucleic acids are NOT DETECTED.  The SARS-CoV-2 RNA is generally detectable in upper respiratory specimens during the acute phase of infection. The lowest concentration of SARS-CoV-2 viral copies this assay can detect is 138 copies/mL. A negative result does not preclude  SARS-Cov-2 infection and should not be used as the sole basis for treatment or other patient management decisions. A negative result may occur with  improper specimen collection/handling, submission of specimen other than nasopharyngeal swab, presence of viral mutation(s) within the areas targeted by this assay, and inadequate number of viral copies(<138 copies/mL). A negative result must be combined with clinical observations, patient history, and epidemiological information. The expected result is Negative.  Fact Sheet for Patients:  EntrepreneurPulse.com.au  Fact Sheet for Healthcare Providers:  IncredibleEmployment.be  This test is no t yet approved or cleared by the [place] FDA and  has been authorized for detection and/or diagnosis of SARS-CoV-2 by FDA under an Emergency Use Authorization (EUA). This EUA will remain  in effect (meaning this test can be used) for the duration of the COVID-19 declaration under Section 564(b)(1) of the Act, 21 U.S.C.section 360bbb-3(b)(1), unless the authorization is terminated  or revoked sooner.       Influenza A by PCR POSITIVE (A) NEGATIVE   Influenza B by PCR NEGATIVE NEGATIVE    Comment: (NOTE) The Xpert Xpress SARS-CoV-2/FLU/RSV plus assay is intended as an aid in the diagnosis of influenza from Nasopharyngeal swab specimens and should not be used as a sole basis for treatment. Nasal washings and aspirates are unacceptable for Xpert Xpress SARS-CoV-2/FLU/RSV testing.  Fact Sheet for Patients: EntrepreneurPulse.com.au  Fact Sheet for Healthcare Providers: IncredibleEmployment.be  This test is not yet approved or cleared by the [place] FDA and has been authorized for detection and/or diagnosis of SARS-CoV-2 by FDA under an Emergency Use Authorization (EUA). This EUA will remain in effect (meaning this test can be used) for the duration of  the COVID-19 declaration under Section 564(b)(1) of the Act, 21 U.S.C. section 360bbb-3(b)(1), unless the authorization is terminated or revoked.  Performed at [facility], [place] [address], [place], [place] [address]   Lactic acid, plasma     Status: Abnormal   Collection Time: 08/28/21  9:43 PM  Result Value Ref Range   Lactic Acid, Venous 2.3 (HH) 0.5 - 1.9 mmol/L    Comment: CRITICAL RESULT CALLED TO, READ BACK BY AND VERIFIED WITH: Cameren Earnest F @2234  BY BATTLET Performed at [facility] [address], [place], [place] [address]   Comprehensive metabolic panel     Status: Abnormal   Collection Time: 08/28/21  9:43 PM  Result Value Ref Range   Sodium 137 135 - 145 mmol/L   Potassium 3.9 3.5 - 5.1 mmol/L   Chloride 109 98 - 111 mmol/L   CO2 20 (L) 22 - 32 mmol/L   Glucose, Bld 200 (H) 70 - 99 mg/dL    Comment: Glucose reference range applies only to samples taken after fasting for at least 8 hours.   BUN 31 (H) 8 - 23 mg/dL   Creatinine, Ser 1.64 (H) 0.61 - 1.24 mg/dL   Calcium  8.6 (L) 8.9 - 10.3 mg/dL   Total Protein 6.3 (L) 6.5 - 8.1 g/dL   Albumin 3.5 3.5 - 5.0 g/dL   AST 21 15 - 41 U/L   ALT 18 0 - 44 U/L   Alkaline Phosphatase 50 38 - 126 U/L   Total Bilirubin 1.0 0.3 - 1.2 mg/dL   GFR, Estimated 39 (L) >60 mL/min    Comment: (NOTE) Calculated using the CKD-EPI Creatinine Equation (2021)    Anion gap 8 5 - 15    Comment: Performed at [facility], [place] [address], [place], [place] [address]  CBC WITH DIFFERENTIAL     Status: Abnormal   Collection Time: 08/28/21  9:43 PM  Result Value Ref Range   WBC 9.4 4.0 - 10.5 K/uL   RBC 4.46 4.22 - 5.81 MIL/uL   Hemoglobin 13.5 13.0 - 17.0 g/dL   HCT 41.7 39.0 - 52.0 %   MCV 93.5 80.0 - 100.0 fL   MCH 30.3 26.0 - 34.0 pg   MCHC 32.4 30.0 - 36.0 g/dL   RDW 14.6 11.5 - 15.5 %   Platelets 145 (L) 150 - 400 K/uL   nRBC 0.0 0.0 - 0.2 %   Neutrophils Relative % 72 %    Neutro Abs 6.8 1.7 - 7.7 K/uL   Lymphocytes Relative 15 %   Lymphs Abs 1.5 0.7 - 4.0 K/uL   Monocytes Relative 9 %   Monocytes Absolute 0.9 0.1 - 1.0 K/uL   Eosinophils Relative 2 %   Eosinophils Absolute 0.2 0.0 - 0.5 K/uL   Basophils Relative 1 %   Basophils Absolute 0.1 0.0 - 0.1 K/uL   Immature Granulocytes 1 %   Abs Immature Granulocytes 0.06 0.00 - 0.07 K/uL    Comment: Performed at [facility], [place] [address], [place], [place] [address]  Protime-INR     Status: Abnormal   Collection Time: 08/28/21  9:43 PM  Result Value Ref Range   Prothrombin Time 15.5 (H) 11.4 - 15.2 seconds   INR 1.2 0.8 - 1.2    Comment: (NOTE) INR goal varies based on device and disease states. Performed at [facility], [place] [address] [place], [place] [address]   APTT     Status: None   Collection Time: 08/28/21  9:43 PM  Result Value Ref Range   aPTT 31 24 - 36 seconds    Comment: Performed at [facility], [place] [address], [place], [place] [address]   CT Head Wo Contrast  Result Date: 08/28/2021 CLINICAL DATA:  Orthostatic changes and altered mental status. EXAM: CT HEAD WITHOUT CONTRAST TECHNIQUE: Contiguous axial images were obtained from the base of the skull through the vertex without intravenous contrast. COMPARISON:  September 08, 2016 FINDINGS: Brain: There is mild cerebral atrophy with widening of the extra-axial spaces and ventricular dilatation. There are areas of decreased attenuation within the white matter tracts of the supratentorial brain, consistent with microvascular disease changes. Small, bilateral chronic basal ganglia lacunar infarcts are noted. A chronic left occipital lobe infarct is also seen. Vascular: No hyperdense vessel or unexpected calcification. Skull: Normal. Negative for fracture or focal lesion. Sinuses/Orbits: No acute finding. Other: None. IMPRESSION: 1. Generalized cerebral atrophy. 2. No acute intracranial  abnormality. Electronically Signed   By: Virgina Norfolk M.D.   On: 08/28/2021 21:58   DG Chest Port 1 View  Result Date: 08/28/2021 CLINICAL DATA:  Questionable sepsis.  Diagnosed with flu EXAM: PORTABLE CHEST  1 VIEW COMPARISON:  Chest x-ray 04/02/2021, CT chest 03/31/2021 FINDINGS: The heart and mediastinal contours are unchanged. Aortic calcification. No focal consolidation. Question retrocardiac airspace opacity. No pleural effusion. No pneumothorax. No acute osseous abnormality. IMPRESSION: Question retrocardiac airspace opacity. Electronically Signed   By: Iven Finn M.D.   On: 08/28/2021 21:46    Pending Labs Unresulted Labs (From admission, onward)     Start     Ordered   08/28/21 2115  Lactic acid, plasma  (Septic presentation on arrival (screening labs, nursing and treatment orders for obvious sepsis))  Now then every 2 hours,   STAT      08/28/21 2114   08/28/21 2115  Blood Culture (routine x 2)  (Septic presentation on arrival (screening labs, nursing and treatment orders for obvious sepsis))  BLOOD CULTURE X 2,   STAT      08/28/21 2114   08/28/21 2115  Urinalysis, Routine w reflex microscopic  (Septic presentation on arrival (screening labs, nursing and treatment orders for obvious sepsis))  ONCE - STAT,   STAT        08/28/21 2114   08/28/21 2115  Urine Culture  (Septic presentation on arrival (screening labs, nursing and treatment orders for obvious sepsis))  ONCE - STAT,   STAT       Question:  Indication  Answer:  Altered mental status (if no other cause identified)   08/28/21 2114            Vitals/Pain Today's Vitals   08/28/21 2122 08/28/21 2200 08/28/21 2230 08/28/21 2300  BP: 111/71 94/61 98/68  (!) 82/74  Pulse: 61 (!) 109 (!) 105 (!) 105  Resp: (!) 24 (!) 25 (!) 23 13  Temp: 99.9 F (37.7 C) (!) 100.4 F (38 C)    TempSrc: Oral Rectal    SpO2: 94% 96% 98% 96%  PainSc:        Isolation Precautions No active isolations  Medications Medications   lactated ringers infusion ( Intravenous New Bag/Given 08/28/21 2203)  cefTRIAXone (ROCEPHIN) 2 g in sodium chloride 0.9 % 100 mL IVPB (0 g Intravenous Stopped 08/28/21 2304)  azithromycin (ZITHROMAX) 500 mg in sodium chloride 0.9 % 250 mL IVPB (500 mg Intravenous New Bag/Given 08/28/21 2202)  hydrocortisone sodium succinate (SOLU-CORTEF) 100 MG injection 50 mg (50 mg Intravenous Given 08/28/21 2307)  lactated ringers bolus 1,000 mL (1,000 mLs Intravenous New Bag/Given 08/28/21 2307)    Mobility walks with device Moderate fall risk   Focused Assessments    R Recommendations: See Admitting Provider Note  Report given to:   Additional Notes:

## 2021-08-28 NOTE — ED Provider Notes (Signed)
Emergency Department Provider Note   I have reviewed the triage vital signs and the nursing notes.   HISTORY  Chief Complaint Fever and Weakness   HPI Casey Lee is a 87 y.o. male with past history reviewed below including A. fib, temporal arteritis on prednisone, prior TIA presents to the emergency department with generalized weakness, cough, fatigue.  He apparently tested positive at urgent care today for flu A via a rapid test in the [facility] system.  The patient's wife states that over the past 24 hours she has become very weak and was unable to get out of his chair.  She called for help first from her son and then EMS at which time patient was transported to the emergency department.  There apparently found him febrile and gave Tylenol prior to arrival along with 1 L normal saline bolus.  The patient's wife states that he has been confused at home with difficulty recalling where the bathroom was located in the house.  He has not had very much of an appetite.  Has not been complaining of pain in his chest or abdomen.  No vomiting or diarrhea.   Level 5 caveat: AMS.    Past Medical History:  Diagnosis Date   Atrial fibrillation ([place])    Hearing loss    High cholesterol    Temporal arteritis (HCC)    TIA (transient ischemic attack)    UTI (lower urinary tract infection)    Vertigo     Patient Active Problem List   Diagnosis Date Noted   Generalized weakness    CAP (community acquired pneumonia) 08/28/2021   Sepsis ([place]) 08/28/2021   Influenza A 08/28/2021   Sepsis due to undetermined organism ([place]) 03/30/2021   PAF (paroxysmal atrial fibrillation) ([place]) 03/30/2021   Right knee pain 05/12/2018   Acute renal failure superimposed on stage 3 chronic kidney disease ([place]) 05/11/2018   Septic prepatellar bursitis of right knee 05/11/2018   Temporal arteritis (HCC)    HLD (hyperlipidemia) 03/19/2016   SDH (subdural hematoma) 01/28/2016   BPPV (benign paroxysmal  positional vertigo) 01/28/2016   Cerebral infarction ([place]) - incidental, punctate R periventricular infarct 01/28/2016   Essential hypertension 01/28/2016   Hyperlipidemia LDL goal <70 01/28/2016   Acute encephalopathy    Traumatic hemorrhage of cerebrum with loss of consciousness ([place]) - tiny L caudate 01/24/2016    Past Surgical History:  Procedure Laterality Date   ARTERY BIOPSY Right 04/30/2018   Procedure: BIOPSY TEMPORAL ARTERY;  Surgeon: Izora Gala, MD;  Location: [facility];  Service: ENT;  Laterality: Right;   CATARACT EXTRACTION Right 04/28/2020   I & D EXTREMITY Right 05/12/2018   Procedure: IRRIGATION AND DEBRIDEMENT RIGHT KNEE BURSA;  Surgeon: Marchia Bond, MD;  Location: [facility];  Service: Orthopedics;  Laterality: Right;   none      Allergies Fosamax [alendronate] and Clopidogrel  Family History  Problem Relation Age of Onset   Rheum arthritis Father    Heart failure Father        Deceased, 78   Rheum arthritis Sister    Diabetes Mellitus II Sister    Healthy Son        x2    Social History Social History   Tobacco Use   Smoking status: Former    Types: Cigarettes    Quit date: 11/07/1952    Years since quitting: 68.8   Smokeless tobacco: Never  Vaping Use   Vaping Use: Never used  Substance  Use Topics   Alcohol use: No    Alcohol/week: 0.0 standard drinks   Drug use: No    Review of Systems  Constitutional: Positive fever/chills. Positive generalized weakness.  Eyes: No visual changes. ENT: No sore throat. Cardiovascular: Denies chest pain. Respiratory: Denies shortness of breath. Positive cough.  Gastrointestinal: No abdominal pain.  No nausea, no vomiting.  No diarrhea.  No constipation. Genitourinary: Negative for dysuria. Musculoskeletal: Negative for back pain. Skin: Negative for rash. Neurological: Negative for headaches, focal weakness or numbness.  10-point ROS otherwise  negative.  ____________________________________________   PHYSICAL EXAM:  VITAL SIGNS: Vitals:   08/30/21 0700 08/30/21 0752  BP: 108/75   Pulse:    Resp: 18   Temp:    SpO2: 93% 94%    Constitutional: Alert but oriented to person only. Appears unwell but participating in the full exam and providing a confused history. Warm to touch.  Eyes: Conjunctivae are normal. PERRL.  Head: Atraumatic. Nose: No congestion/rhinnorhea. Mouth/Throat: Mucous membranes are moist.   Neck: No stridor.  Cardiovascular: Tachycardia. Good peripheral circulation. Grossly normal heart sounds.   Respiratory: Normal respiratory effort.  No retractions. Lungs CTAB. Gastrointestinal: Soft and nontender. No distention.  Musculoskeletal: No lower extremity tenderness nor edema. No gross deformities of extremities. Neurologic:  Normal speech and language. No gross focal neurologic deficits are appreciated.  Skin:  Skin is warm, dry and intact. No rash noted.   ____________________________________________   LABS (all labs ordered are listed, but only abnormal results are displayed)  Labs Reviewed  RESP PANEL BY RT-PCR (FLU A&B, COVID) ARPGX2 - Abnormal; Notable for the following components:      Result Value   Influenza A by PCR POSITIVE (*)    All other components within normal limits  BLOOD CULTURE ID PANEL (REFLEXED) - BCID2 - Abnormal; Notable for the following components:   Staphylococcus species DETECTED (*)    Staphylococcus epidermidis DETECTED (*)    Methicillin resistance mecA/C DETECTED (*)    All other components within normal limits  LACTIC ACID, PLASMA - Abnormal; Notable for the following components:   Lactic Acid, Venous 2.3 (*)    All other components within normal limits  COMPREHENSIVE METABOLIC PANEL - Abnormal; Notable for the following components:   CO2 20 (*)    Glucose, Bld 200 (*)    BUN 31 (*)    Creatinine, Ser 1.64 (*)    Calcium 8.6 (*)    Total Protein 6.3 (*)     GFR, Estimated 39 (*)    All other components within normal limits  CBC WITH DIFFERENTIAL/PLATELET - Abnormal; Notable for the following components:   Platelets 145 (*)    All other components within normal limits  PROTIME-INR - Abnormal; Notable for the following components:   Prothrombin Time 15.5 (*)    All other components within normal limits  CBC WITH DIFFERENTIAL/PLATELET - Abnormal; Notable for the following components:   Platelets 144 (*)    All other components within normal limits  COMPREHENSIVE METABOLIC PANEL - Abnormal; Notable for the following components:   CO2 20 (*)    Glucose, Bld 184 (*)    BUN 30 (*)    Creatinine, Ser 1.48 (*)    Calcium 8.6 (*)    Total Protein 6.2 (*)    Albumin 3.3 (*)    GFR, Estimated 44 (*)    All other components within normal limits  LACTIC ACID, PLASMA - Abnormal; Notable for the following components:  Lactic Acid, Venous 2.6 (*)    All other components within normal limits  HEMOGLOBIN A1C - Abnormal; Notable for the following components:   Hgb A1c MFr Bld 7.9 (*)    All other components within normal limits  URINALYSIS, ROUTINE W REFLEX MICROSCOPIC - Abnormal; Notable for the following components:   Glucose, UA 50 (*)    Ketones, ur 20 (*)    All other components within normal limits  CBC WITH DIFFERENTIAL/PLATELET - Abnormal; Notable for the following components:   WBC 18.8 (*)    Neutro Abs 16.7 (*)    Abs Immature Granulocytes 0.18 (*)    All other components within normal limits  BASIC METABOLIC PANEL - Abnormal; Notable for the following components:   CO2 18 (*)    Glucose, Bld 155 (*)    BUN 31 (*)    Creatinine, Ser 1.58 (*)    GFR, Estimated 41 (*)    Anion gap 16 (*)    All other components within normal limits  CULTURE, BLOOD (ROUTINE X 2)  CULTURE, BLOOD (ROUTINE X 2)  MRSA NEXT GEN BY PCR, NASAL  URINE CULTURE  EXPECTORATED SPUTUM ASSESSMENT W GRAM STAIN, RFLX TO RESP C  URINE CULTURE  APTT  LACTIC ACID,  PLASMA  MAGNESIUM  TSH  LACTIC ACID, PLASMA   ____________________________________________  EKG   EKG Interpretation  Date/Time:  Saturday August 28 2021 21:19:14 EST Ventricular Rate:  126 PR Interval:    QRS Duration: 89 QT Interval:  352 QTC Calculation: 510 R Axis:   -65 Text Interpretation: Atrial fibrillation LAD, consider left anterior fascicular block Anteroseptal infarct, age indeterminate Prolonged QT interval Confirmed by Nanda Quinton [phone]) on 08/28/2021 9:55:46 PM        ____________________________________________  RADIOLOGY  DG Chest Port 1 View  Result Date: 08/28/2021 CLINICAL DATA:  Questionable sepsis.  Diagnosed with flu EXAM: PORTABLE CHEST 1 VIEW COMPARISON:  Chest x-ray 04/02/2021, CT chest 03/31/2021 FINDINGS: The heart and mediastinal contours are unchanged. Aortic calcification. No focal consolidation. Question retrocardiac airspace opacity. No pleural effusion. No pneumothorax. No acute osseous abnormality. IMPRESSION: Question retrocardiac airspace opacity. Electronically Signed   By: Iven Finn M.D.   On: 08/28/2021 21:46    ____________________________________________   PROCEDURES  Procedure(s) performed:   Procedures  CRITICAL CARE Performed by: Margette Fast Total critical care time: 35 minutes Critical care time was exclusive of separately billable procedures and treating other patients. Critical care was necessary to treat or prevent imminent or life-threatening deterioration. Critical care was time spent personally by me on the following activities: development of treatment plan with patient and/or surrogate as well as nursing, discussions with consultants, evaluation of patient's response to treatment, examination of patient, obtaining history from patient or surrogate, ordering and performing treatments and interventions, ordering and review of laboratory studies, ordering and review of radiographic studies, pulse oximetry  and re-evaluation of patient's condition.  Nanda Quinton, MD Emergency Medicine  ____________________________________________   INITIAL IMPRESSION / ASSESSMENT AND PLAN / ED COURSE  Pertinent labs & imaging results that were available during my care of the patient were reviewed by me and considered in my medical decision making (see chart for details).   Patient arrives to the emergency department by EMS with concern for underlying sepsis.  He was febrile in route with EMS and given Tylenol along with 1 L normal saline which had completed by the time he arrived.  He was not hypotensive with EMS.  No antibiotics  given prior to arrival.  He did test positive for flu on a rapid test performed at urgent care.  This result is available in the care everywhere section of epic.  He has no focal neurologic deficits but is confused and anticoagulated.  Plan for CT imaging of the head but low suspicion for acute intracranial process such as bleed or stroke.  Likely AMS related to the flu.  Does have history of UTI and infection causing confusion in the past.  He is on chronic prednisone for giant cell arteritis.   09:50 PM CXR with question of retrocardiac opacity. Will start abx with sepsis concern. Labs pending. EKG with A-fib and rate in the 120s but mostly lower at bedside. Will allow for some mild tachycardia for now which is likely compensatory for an underlying infectious process.   Discussed patient's case with [facility]  to request admission. Patient and family (if present) updated with plan. Care transferred to [facility] service.  I reviewed all nursing notes, vitals, pertinent old records, EKGs, labs, imaging (as available).  ____________________________________________  FINAL CLINICAL IMPRESSION(S) / ED DIAGNOSES  Final diagnoses:  Influenza-like illness  Generalized weakness     MEDICATIONS GIVEN DURING THIS VISIT:  Medications  cefTRIAXone (ROCEPHIN) 2 g in sodium chloride 0.9 % 100 mL IVPB (0  g Intravenous Stopped 08/29/21 2245)  azithromycin (ZITHROMAX) 500 mg in sodium chloride 0.9 % 250 mL IVPB (0 mg Intravenous Stopped 08/30/21 0000)  rosuvastatin (CRESTOR) tablet 10 mg (10 mg Oral Given 08/29/21 1003)  apixaban (ELIQUIS) tablet 2.5 mg (2.5 mg Oral Given 08/29/21 2126)  loratadine (CLARITIN) tablet 10 mg (10 mg Oral Given 08/29/21 1003)  acetaminophen (TYLENOL) tablet 650 mg (has no administration in time range)    Or  acetaminophen (TYLENOL) suppository 650 mg (has no administration in time range)  MEDLINE mouth rinse (15 mLs Mouth Rinse Not Given 08/29/21 2126)  Chlorhexidine Gluconate Cloth 2 % PADS 6 each (6 each Topical Given 08/30/21 0535)  lactated ringers infusion (0 mLs Intravenous Stopped 08/30/21 0527)  fluticasone (FLONASE) 50 MCG/ACT nasal spray 2 spray (2 sprays Each Nare Given 08/29/21 1015)  pantoprazole (PROTONIX) EC tablet 40 mg (40 mg Oral Given 08/30/21 0532)  levalbuterol (XOPENEX) nebulizer solution 0.63 mg (0.63 mg Nebulization Given 08/30/21 0752)  metoprolol succinate (TOPROL-XL) 24 hr tablet 25 mg (25 mg Oral Given 08/29/21 1104)  diltiazem (CARDIZEM) 125 mg in dextrose 5% 125 mL (1 mg/mL) infusion (5 mg/hr Intravenous Rate/Dose Change 08/30/21 0402)  melatonin tablet 5 mg (5 mg Oral Not Given 08/29/21 2126)  vancomycin (VANCOREADY) IVPB 750 mg/150 mL (has no administration in time range)  oseltamivir (TAMIFLU) capsule 30 mg (has no administration in time range)  hydrocortisone sodium succinate (SOLU-CORTEF) 100 MG injection 25 mg (has no administration in time range)  hydrocortisone sodium succinate (SOLU-CORTEF) 100 MG injection 50 mg (50 mg Intravenous Given 08/29/21 1104)  lactated ringers bolus 1,000 mL (0 mLs Intravenous Stopped 08/29/21 0135)  diltiazem (CARDIZEM) 1 mg/mL load via infusion 5 mg (5 mg Intravenous Bolus from Bag 08/29/21 1857)  vancomycin (VANCOREADY) IVPB 1500 mg/300 mL (0 mg Intravenous Stopped 08/30/21 0400)    Note:  This  document was prepared using Dragon voice recognition software and may include unintentional dictation errors.  Nanda Quinton, MD, [facility] Emergency [facility]    Audyn Dimercurio, [facility], MD 08/30/21 [phone]

## 2021-08-28 NOTE — ED Notes (Signed)
Pt taken to CT.

## 2021-08-28 NOTE — H&P (Signed)
History and Physical    ASHDEN SONNENBERG KKX:[id number] DOB: 02/19/1928 DOA: 08/28/2021  PCP: [name], MD  Patient coming from: Home.  History obtained from patient's son and patient.  Chief Complaint: Weakness and confusion.  HPI: Casey Lee is a 87 y.o. male with history of temporal arteritis on prednisolone, chronic kidney disease stage III, hyperlipidemia, atrial fibrillation has been experiencing cough and subjective feeling of fever chills for the last 3 days.  Has been progressively getting worse and had gone to his PCP today and was prescribed baloxavir for influenza A.  COVID test was negative.  After going home patient was getting more confused weak unable to ambulate and was brought to the ER by EMS.  ED Course: In the ER patient was hypotensive with systolic blood pressure in the 80s temperature of 100.4 and was in A. fib with RVR.  Chest x-ray showed retrosternal densities.  Patient had blood cultures drawn and started on empiric antibiotics for pneumonia and also for influenza A fluid bolus for sepsis and admitted for further management.  Lactic acid was 2.3 and then 2.6 and eventually improved.  Blood glucose 200 with anion gap of 8 bicarb of 20 platelets of 145 influenza A came back positive but COVID test was negative.  CT head is unremarkable.  At the time of my exam patient has become more alert awake.  Review of Systems: As per HPI, rest all negative.   Past Medical History:  Diagnosis Date   Atrial fibrillation ([place])    Hearing loss    High cholesterol    Temporal arteritis ([facility])    TIA (transient ischemic attack)    UTI (lower urinary tract infection)    Vertigo     Past Surgical History:  Procedure Laterality Date   ARTERY BIOPSY Right 04/30/2018   Procedure: BIOPSY TEMPORAL ARTERY;  Surgeon: Izora Gala, MD;  Location: [facility];  Service: ENT;  Laterality: Right;   CATARACT EXTRACTION Right 04/28/2020   I & D EXTREMITY Right  05/12/2018   Procedure: IRRIGATION AND DEBRIDEMENT RIGHT KNEE BURSA;  Surgeon: Marchia Bond, MD;  Location: [place];  Service: Orthopedics;  Laterality: Right;   none       reports that he quit smoking about 68 years ago. His smoking use included cigarettes. He has never used smokeless tobacco. He reports that he does not drink alcohol and does not use drugs.  Allergies  Allergen Reactions   Fosamax [Alendronate] Other (See Comments)    SEVERE JAW(BONE) PAIN   Clopidogrel     Other reaction(s): Other (See Comments)    Family History  Problem Relation Age of Onset   Rheum arthritis Father    Heart failure Father        Deceased, 74   Rheum arthritis Sister    Diabetes Mellitus II Sister    Healthy Son        x2    Prior to Admission medications   Medication Sig Start Date End Date Taking? Authorizing Provider  albuterol (VENTOLIN HFA) 108 (90 Base) MCG/ACT inhaler Inhale 2 puffs into the lungs every 6 (six) hours as needed for wheezing or shortness of breath. 04/04/21  Yes Ghimire, Henreitta Leber, MD  apixaban (ELIQUIS) 2.5 MG TABS tablet Take 1 tablet (2.5 mg total) by mouth 2 (two) times daily. 04/04/21  Yes Ghimire, Henreitta Leber, MD  Baloxavir Marboxil,40 MG Dose, (XOFLUZA, 40 MG DOSE,) 1 x 40 MG TBPK Take 40 mg by mouth  once.   Yes [provider]  benzonatate (TESSALON PERLES) 100 MG capsule Take 1 capsule (100 mg total) by mouth 3 (three) times daily as needed for cough. 04/04/21 04/04/22 Yes Ghimire, Henreitta Leber, MD  Calcium-Phosphorus-Vitamin D (CALCIUM/D3 ADULT GUMMIES PO) Take 3 tablets by mouth See admin instructions. CHEW 3 gummies by mouth daily   Yes [provider]  Coenzyme Q10 (COQ10 PO) Take 1 capsule by mouth daily with lunch.   Yes [provider]  loratadine (CLARITIN) 10 MG tablet Take 10 mg by mouth daily.   Yes [provider]  metoprolol succinate (TOPROL-XL) 25 MG 24 hr tablet Take 1 tablet (25 mg total) by mouth daily. 05/17/18  Yes  Shelly Coss, MD  neomycin-polymyxin b-dexamethasone (MAXITROL) 3.5-10000-0.1 OINT Place 1 application into the left eye 2 (two) times daily. 07/13/21  Yes [provider]  prednisoLONE 5 MG TABS tablet Take 5 mg by mouth daily.   Yes [provider]  rosuvastatin (CRESTOR) 10 MG tablet Take 10 mg by mouth daily. 08/05/21  Yes [provider]  tamsulosin (FLOMAX) 0.4 MG CAPS capsule Take 1 capsule (0.4 mg total) by mouth daily after supper. 04/04/21  Yes Ghimire, Henreitta Leber, MD  cefdinir (OMNICEF) 300 MG capsule Take 1 capsule (300 mg total) by mouth daily. Patient not taking: Reported on 08/28/2021 04/04/21   Jonetta Osgood, MD  predniSONE (DELTASONE) 5 MG tablet Take 6 tablets (30 mg) p.o. daily for 2 days, then, Take 4 tablets (20 mg) p.o. daily for 2 days, then, Take 2 tablets (10 mg) p.o. daily for 2 days, then, Resume your usual dosing of 5 mg p.o. daily. Patient not taking: Reported on 08/28/2021 04/04/21   Jonetta Osgood, MD    Physical Exam: Constitutional: Moderately built and nourished. Vitals:   08/28/21 2230 08/28/21 2300 08/28/21 2330 08/28/21 2336  BP: 98/68 (!) 82/74 92/60   Pulse: (!) 105 (!) 105 96 96  Resp: (!) 23 13 (!) 31 20  Temp:   99.9 F (37.7 C)   TempSrc:   Oral   SpO2: 98% 96% 96% 97%   Eyes: Anicteric no pallor. ENMT: No discharge from the ears eyes nose and mouth. Neck: No mass felt.  No neck rigidity. Respiratory: No rhonchi or crepitations. Cardiovascular: S1-S2 heard. Abdomen: Soft nontender bowel sound present. Musculoskeletal: No edema. Skin: No rash. Neurologic: Alert awake oriented to name and place.  Moving all extremities. Psychiatric: Oriented to name and place.   Labs on Admission: I have personally reviewed following labs and imaging studies  CBC: Recent Labs  Lab 08/28/21 2143  WBC 9.4  NEUTROABS 6.8  HGB 13.5  HCT 41.7  MCV 93.5  PLT 517*   Basic Metabolic Panel: Recent Labs  Lab 08/28/21 2143   NA 137  K 3.9  CL 109  CO2 20*  GLUCOSE 200*  BUN 31*  CREATININE 1.64*  CALCIUM 8.6*   GFR: CrCl cannot be calculated (Unknown ideal weight.). Liver Function Tests: Recent Labs  Lab 08/28/21 2143  AST 21  ALT 18  ALKPHOS 50  BILITOT 1.0  PROT 6.3*  ALBUMIN 3.5   No results for input(s): LIPASE, AMYLASE in the last 168 hours. No results for input(s): AMMONIA in the last 168 hours. Coagulation Profile: Recent Labs  Lab 08/28/21 2143  INR 1.2   Cardiac Enzymes: No results for input(s): CKTOTAL, CKMB, CKMBINDEX, TROPONINI in the last 168 hours. BNP (last 3 results) No results for input(s): PROBNP in  the last 8760 hours. HbA1C: No results for input(s): HGBA1C in the last 72 hours. CBG: No results for input(s): GLUCAP in the last 168 hours. Lipid Profile: No results for input(s): CHOL, HDL, LDLCALC, TRIG, CHOLHDL, LDLDIRECT in the last 72 hours. Thyroid Function Tests: No results for input(s): TSH, T4TOTAL, FREET4, T3FREE, THYROIDAB in the last 72 hours. Anemia Panel: No results for input(s): VITAMINB12, FOLATE, FERRITIN, TIBC, IRON, RETICCTPCT in the last 72 hours. Urine analysis:    Component Value Date/Time   COLORURINE YELLOW 03/30/2021 1435   APPEARANCEUR HAZY (A) 03/30/2021 1435   LABSPEC 1.017 03/30/2021 1435   PHURINE 5.0 03/30/2021 1435   GLUCOSEU NEGATIVE 03/30/2021 1435   HGBUR SMALL (A) 03/30/2021 1435   BILIRUBINUR NEGATIVE 03/30/2021 1435   KETONESUR NEGATIVE 03/30/2021 1435   PROTEINUR NEGATIVE 03/30/2021 1435   NITRITE NEGATIVE 03/30/2021 1435   LEUKOCYTESUR SMALL (A) 03/30/2021 1435   Sepsis Labs: @LABRCNTIP (procalcitonin:4,lacticidven:4) ) Recent Results (from the past 240 hour(s))  Resp Panel by RT-PCR (Flu A&B, Covid) Nasopharyngeal Swab     Status: Abnormal   Collection Time: 08/28/21  9:32 PM   Specimen: Nasopharyngeal Swab; Nasopharyngeal(NP) swabs in vial transport medium  Result Value Ref Range Status   SARS Coronavirus 2 by RT  PCR NEGATIVE NEGATIVE Final    Comment: (NOTE) SARS-CoV-2 target nucleic acids are NOT DETECTED.  The SARS-CoV-2 RNA is generally detectable in upper respiratory specimens during the acute phase of infection. The lowest concentration of SARS-CoV-2 viral copies this assay can detect is 138 copies/mL. A negative result does not preclude SARS-Cov-2 infection and should not be used as the sole basis for treatment or other patient management decisions. A negative result may occur with  improper specimen collection/handling, submission of specimen other than nasopharyngeal swab, presence of viral mutation(s) within the areas targeted by this assay, and inadequate number of viral copies(<138 copies/mL). A negative result must be combined with clinical observations, patient history, and epidemiological information. The expected result is Negative.  Fact Sheet for Patients:  EntrepreneurPulse.com.au  Fact Sheet for Healthcare Providers:  IncredibleEmployment.be  This test is no t yet approved or cleared by the [place] FDA and  has been authorized for detection and/or diagnosis of SARS-CoV-2 by FDA under an Emergency Use Authorization (EUA). This EUA will remain  in effect (meaning this test can be used) for the duration of the COVID-19 declaration under Section 564(b)(1) of the Act, 21 U.S.C.section 360bbb-3(b)(1), unless the authorization is terminated  or revoked sooner.       Influenza A by PCR POSITIVE (A) NEGATIVE Final   Influenza B by PCR NEGATIVE NEGATIVE Final    Comment: (NOTE) The Xpert Xpress SARS-CoV-2/FLU/RSV plus assay is intended as an aid in the diagnosis of influenza from Nasopharyngeal swab specimens and should not be used as a sole basis for treatment. Nasal washings and aspirates are unacceptable for Xpert Xpress SARS-CoV-2/FLU/RSV testing.  Fact Sheet for Patients: EntrepreneurPulse.com.au  Fact Sheet  for Healthcare Providers: IncredibleEmployment.be  This test is not yet approved or cleared by the [place] FDA and has been authorized for detection and/or diagnosis of SARS-CoV-2 by FDA under an Emergency Use Authorization (EUA). This EUA will remain in effect (meaning this test can be used) for the duration of the COVID-19 declaration under Section 564(b)(1) of the Act, 21 U.S.C. section 360bbb-3(b)(1), unless the authorization is terminated or revoked.  Performed at [facility], [place] [address], [place], [place] [address]      Radiological  Exams on Admission: CT Head Wo Contrast  Result Date: 08/28/2021 CLINICAL DATA:  Orthostatic changes and altered mental status. EXAM: CT HEAD WITHOUT CONTRAST TECHNIQUE: Contiguous axial images were obtained from the base of the skull through the vertex without intravenous contrast. COMPARISON:  September 08, 2016 FINDINGS: Brain: There is mild cerebral atrophy with widening of the extra-axial spaces and ventricular dilatation. There are areas of decreased attenuation within the white matter tracts of the supratentorial brain, consistent with microvascular disease changes. Small, bilateral chronic basal ganglia lacunar infarcts are noted. A chronic left occipital lobe infarct is also seen. Vascular: No hyperdense vessel or unexpected calcification. Skull: Normal. Negative for fracture or focal lesion. Sinuses/Orbits: No acute finding. Other: None. IMPRESSION: 1. Generalized cerebral atrophy. 2. No acute intracranial abnormality. Electronically Signed   By: Virgina Norfolk M.D.   On: 08/28/2021 21:58   DG Chest Port 1 View  Result Date: 08/28/2021 CLINICAL DATA:  Questionable sepsis.  Diagnosed with flu EXAM: PORTABLE CHEST 1 VIEW COMPARISON:  Chest x-ray 04/02/2021, CT chest 03/31/2021 FINDINGS: The heart and mediastinal contours are unchanged. Aortic calcification. No focal consolidation. Question retrocardiac  airspace opacity. No pleural effusion. No pneumothorax. No acute osseous abnormality. IMPRESSION: Question retrocardiac airspace opacity. Electronically Signed   By: Iven Finn M.D.   On: 08/28/2021 21:46    EKG: Independently reviewed.  A. fib with RVR.  Assessment/Plan Principal Problem:   Sepsis ([place]) Active Problems:   HLD (hyperlipidemia)   Temporal arteritis ([facility])   PAF (paroxysmal atrial fibrillation) ([facility])   CAP (community acquired pneumonia)   Influenza A    Sepsis secondary to pneumonia for which blood cultures have been obtained.  Patient has been started on empiric antibiotics and also Tamiflu for influenza A.  Continue with hydration.  Follow lactate levels.  Place patient on stress dose steroids. Acute encephalopathy secondary to sepsis is improved.  At the time of my exam patient is oriented and following commands.  Continue to monitor closely. A. fib with RVR improved with fluids.  Holding beta-blockers for now due to hypotension initially.  On apixaban for anticoagulation. Hyperlipidemia on statins. Hyperglycemia we will check hemoglobin A1c. History of temporal arteritis on prednisone.  Since patient was hypotensive at presentation I have ordered hydrocortisone stress dose.  We will resume prednisolone eventually. Thrombocytopenia has had thrombocytopenia when compared to blood work done in June 2022.  We will continue to monitor. Chronic kidney disease stage III creatinine appears to be at baseline.  Since patient has sepsis which are on presentation will need close monitoring and inpatient status.   DVT prophylaxis: Apixaban. Code Status: Full code. Family Communication: Patient's son. Disposition Plan: Home. Consults called: None. Admission status: Inpatient.   Rise Patience MD [facility] Pager [phone].  If 7PM-7AM, please contact night-coverage [email or web address] Password [facility]  08/28/2021, 11:51 PM

## 2021-08-29 DIAGNOSIS — I10 Essential (primary) hypertension: Secondary | ICD-10-CM | POA: Diagnosis not present

## 2021-08-29 DIAGNOSIS — M316 Other giant cell arteritis: Secondary | ICD-10-CM

## 2021-08-29 DIAGNOSIS — A419 Sepsis, unspecified organism: Secondary | ICD-10-CM | POA: Diagnosis not present

## 2021-08-29 DIAGNOSIS — J189 Pneumonia, unspecified organism: Secondary | ICD-10-CM | POA: Diagnosis not present

## 2021-08-29 DIAGNOSIS — R531 Weakness: Secondary | ICD-10-CM

## 2021-08-29 DIAGNOSIS — E785 Hyperlipidemia, unspecified: Secondary | ICD-10-CM

## 2021-08-29 LAB — BLOOD CULTURE ID PANEL (REFLEXED) - BCID2

## 2021-08-29 LAB — COMPREHENSIVE METABOLIC PANEL
ALT: 25 U/L (ref 0–44)
AST: 33 U/L (ref 15–41)
Albumin: 3.3 g/dL — ABNORMAL LOW (ref 3.5–5.0)
Alkaline Phosphatase: 53 U/L (ref 38–126)
Anion gap: 10 (ref 5–15)
BUN: 30 mg/dL — ABNORMAL HIGH (ref 8–23)
CO2: 20 mmol/L — ABNORMAL LOW (ref 22–32)
Calcium: 8.6 mg/dL — ABNORMAL LOW (ref 8.9–10.3)
Chloride: 107 mmol/L (ref 98–111)
Creatinine, Ser: 1.48 mg/dL — ABNORMAL HIGH (ref 0.61–1.24)
GFR, Estimated: 44 mL/min — ABNORMAL LOW (ref >60)
Glucose, Bld: 184 mg/dL — ABNORMAL HIGH (ref 70–99)
Potassium: 4.2 mmol/L (ref 3.5–5.1)
Sodium: 137 mmol/L (ref 135–145)
Total Bilirubin: 0.9 mg/dL (ref 0.3–1.2)
Total Protein: 6.2 g/dL — ABNORMAL LOW (ref 6.5–8.1)

## 2021-08-29 LAB — URINALYSIS, ROUTINE W REFLEX MICROSCOPIC
Bilirubin Urine: NEGATIVE
Glucose, UA: 50 mg/dL — AB
Hgb urine dipstick: NEGATIVE
Ketones, ur: 20 mg/dL — AB
Leukocytes,Ua: NEGATIVE
Nitrite: NEGATIVE
Protein, ur: NEGATIVE mg/dL
Specific Gravity, Urine: 1.019 (ref 1.005–1.030)
pH: 5 (ref 5.0–8.0)

## 2021-08-29 LAB — CBC WITH DIFFERENTIAL/PLATELET
Abs Immature Granulocytes: 0.06 10*3/uL (ref 0.00–0.07)
Basophils Absolute: 0.1 10*3/uL (ref 0.0–0.1)
Basophils Relative: 1 %
Eosinophils Absolute: 0 10*3/uL (ref 0.0–0.5)
Eosinophils Relative: 0 %
HCT: 41.9 % (ref 39.0–52.0)
Hemoglobin: 13.7 g/dL (ref 13.0–17.0)
Immature Granulocytes: 1 %
Lymphocytes Relative: 8 %
Lymphs Abs: 0.7 10*3/uL (ref 0.7–4.0)
MCH: 30.6 pg (ref 26.0–34.0)
MCHC: 32.7 g/dL (ref 30.0–36.0)
MCV: 93.7 fL (ref 80.0–100.0)
Monocytes Absolute: 0.6 10*3/uL (ref 0.1–1.0)
Monocytes Relative: 6 %
Neutro Abs: 7.7 10*3/uL (ref 1.7–7.7)
Neutrophils Relative %: 84 %
Platelets: 144 10*3/uL — ABNORMAL LOW (ref 150–400)
RBC: 4.47 MIL/uL (ref 4.22–5.81)
RDW: 14.6 % (ref 11.5–15.5)
WBC: 9.1 10*3/uL (ref 4.0–10.5)
nRBC: 0 % (ref 0.0–0.2)

## 2021-08-29 LAB — LACTIC ACID, PLASMA
Lactic Acid, Venous: 2.6 mmol/L (ref 0.5–1.9)
Lactic Acid, Venous: 1.8 mmol/L (ref 0.5–1.9)

## 2021-08-29 LAB — MRSA NEXT GEN BY PCR, NASAL: MRSA by PCR Next Gen: NOT DETECTED

## 2021-08-29 MED ORDER — DILTIAZEM LOAD VIA INFUSION: 10.0000 mg | Freq: Once | INTRAVENOUS | Status: DC

## 2021-08-29 MED ORDER — FLUTICASONE PROPIONATE 50 MCG/ACT NA SUSP
2.0000 | Freq: Every day | NASAL | Status: DC
  Administered 2021-08-29 – 2021-09-06 (×7): 2 via NASAL
  Filled 2021-08-29: qty 16

## 2021-08-29 MED ORDER — DILTIAZEM HCL-DEXTROSE 125-5 MG/125ML-% IV SOLN (PREMIX)
5.0000 mg/h | INTRAVENOUS | Status: DC
  Administered 2021-08-29 – 2021-08-30 (×2): 5 mg/h via INTRAVENOUS
  Filled 2021-08-29 (×2): qty 125

## 2021-08-29 MED ORDER — HYDROCORTISONE SOD SUC (PF) 100 MG IJ SOLR
50.0000 mg | Freq: Three times a day (TID) | INTRAMUSCULAR | Status: DC
  Administered 2021-08-29 – 2021-08-30 (×2): 50 mg via INTRAVENOUS
  Filled 2021-08-29 (×2): qty 2

## 2021-08-29 MED ORDER — PANTOPRAZOLE SODIUM 40 MG PO TBEC
40.0000 mg | DELAYED_RELEASE_TABLET | Freq: Every day | ORAL | Status: DC
  Administered 2021-08-29 – 2021-09-06 (×9): 40 mg via ORAL
  Filled 2021-08-29 (×9): qty 1

## 2021-08-29 MED ORDER — MELATONIN 5 MG PO TABS
5.0000 mg | ORAL_TABLET | Freq: Once | ORAL | Status: DC
  Filled 2021-08-29: qty 1

## 2021-08-29 MED ORDER — CHLORHEXIDINE GLUCONATE CLOTH 2 % EX PADS
6.0000 | MEDICATED_PAD | Freq: Every day | CUTANEOUS | Status: DC
  Administered 2021-08-29 – 2021-09-06 (×9): 6 via TOPICAL

## 2021-08-29 MED ORDER — LEVALBUTEROL HCL 0.63 MG/3ML IN NEBU
0.6300 mg | INHALATION_SOLUTION | Freq: Two times a day (BID) | RESPIRATORY_TRACT | Status: DC
Start: 2021-08-29 — End: 2021-09-06
  Administered 2021-08-29 – 2021-09-06 (×17): 0.63 mg via RESPIRATORY_TRACT
  Filled 2021-08-29 (×16): qty 3

## 2021-08-29 MED ORDER — LACTATED RINGERS IV SOLN: INTRAVENOUS | Status: AC

## 2021-08-29 MED ORDER — DILTIAZEM LOAD VIA INFUSION
5.0000 mg | Freq: Once | INTRAVENOUS | Status: AC
  Administered 2021-08-29: 19:00:00 5 mg via INTRAVENOUS
  Filled 2021-08-29: qty 5

## 2021-08-29 MED ORDER — LEVALBUTEROL HCL 0.63 MG/3ML IN NEBU: 0.6300 mg | INHALATION_SOLUTION | Freq: Three times a day (TID) | RESPIRATORY_TRACT | Status: DC

## 2021-08-29 MED ORDER — ORAL CARE MOUTH RINSE
15.0000 mL | Freq: Two times a day (BID) | OROMUCOSAL | Status: DC
  Administered 2021-08-29 – 2021-08-30 (×4): 15 mL via OROMUCOSAL

## 2021-08-29 MED ORDER — METOPROLOL SUCCINATE ER 25 MG PO TB24
25.0000 mg | ORAL_TABLET | Freq: Every day | ORAL | Status: DC
  Administered 2021-08-29 – 2021-09-06 (×9): 25 mg via ORAL
  Filled 2021-08-29 (×9): qty 1

## 2021-08-29 NOTE — Progress Notes (Signed)
PROGRESS NOTE    Casey Lee  SWF:[id number] DOB: Jul 25, 1928 DOA: 08/28/2021 PCP: [name], MD   Chief Complaint  Patient presents with   Fever   Weakness    Brief Narrative:  Patient pleasant 87 year old male with history of temporal arteritis on prednisone, CKD stage IIIa, hyperlipidemia, A. fib presented with cough, subjective fevers of fever, chills, x3 days.  Patient worsening saw PCP prescribed baloxavir for influenza A.  COVID 19 PCR negative.  Due to mild confusion, generalized weakness patient presented to the ED.  On presentation to the ED patient noted to be hypotensive systolic blood pressures in the 80s, temp of 100.4, A. fib with RVR, chest x-ray consistent with retrosternal densities.  Pancultured.  Placed empirically on IV antibiotics for pneumonia and Tamiflu for influenza A.  Head CT unremarkable.  Repeat COVID test was negative..  Influenza a test positive.   Assessment & Plan:   Principal Problem:   Sepsis ([place]) Active Problems:   CAP (community acquired pneumonia)   Influenza A   Essential hypertension   HLD (hyperlipidemia)   Temporal arteritis (H[facility])   PAF (paroxysmal atrial fibrillation) ([place])  1 sepsis secondary to pneumonia -Patient presented meeting criteria for sepsis with hypotension, fever, tachycardia, chest x-ray consistent with a pneumonia.  Influenza A PCR positive.  COVID-19 PCR negative. -Placed on stress dose steroids with some improvement with blood pressure. -Slowly improving clinically. -Blood cultures pending. -UA pending. -Continue Tamiflu, azithromycin, Rocephin. -Placed on Xopenex nebs, Flonase, Claritin, PPI.  2.  A. fib with RVR -Patient noted to have heart rates ranging from low 100s to 140s. -Beta-blocker held on admission which we will resume for better rate control as blood pressure has improved. -Eliquis for anticoagulation.  3.  Influenza A -Tamiflu. -Start Xopenex nebs, Claritin, Flonase, PPI. -Supportive  care.  4.  History of temporal arteritis -Noted to be on prednisolone 5 mg daily. -Due to presentation with sepsis, hypotension, patient placed on stress dose steroids which we will continue for now and taper. -Discontinue prednisolone.  5.  Hyperlipidemia -Statin.  6.  Acute metabolic encephalopathy -Felt secondary to sepsis, pneumonia, influenza A. -Improving clinically. -Continue antibiotics, Tamiflu, hydration with IV fluids.  7.  Chronic thrombocytopenia -Stable.  8.  Chronic kidney disease stage IIIa -Stable.  9.  Hyperglycemia -Likely steroid-induced. -Hemoglobin A1c 6.3 (01/25/2016)   DVT prophylaxis: Eliquis Code Status: Full Family Communication: Updated patient, son at bedside. Disposition:   Status is: Inpatient  Remains inpatient appropriate because: Severity of illness       Consultants:  None  Procedures:  CT at 08/28/2021 Chest x-ray 08/28/2021  Antimicrobials:  IV azithromycin 08/28/2021>>>>> 09/02/2021 IV Rocephin 08/28/2021>>>>> 09/02/2021   Subjective: Patient states early on this morning was feeling very rotten.  Still with a cough.  No chest pain.  No abdominal pain.  Feels shortness of breath is improving.  Son at bedside.  Per son patient less confused this morning than on presentation however still with some confusion.  Son concerned patient may also have a UTI as confusion was his last presentation for severe UTI.  Objective: Vitals:   08/29/21 0925 08/29/21 1000 08/29/21 1100 08/29/21 1133  BP:  106/77 112/67   Pulse:  83 100 (!) 105  Resp:   (!) 30 16  Temp:    (!) 97.1 F (36.2 C)  TempSrc:    Axillary  SpO2: 91% 95% 94% 94%    Intake/Output Summary (Last 24 hours) at 08/29/2021 1533 Last data filed at  08/29/2021 1124 Gross per 24 hour  Intake 2977.15 ml  Output 300 ml  Net 2677.15 ml   There were no vitals filed for this visit.  Examination:  General exam: NAD. Respiratory system: Coarse rhonchorous breath sounds  right > left.  No wheezing.  No crackles.  Fair air movement.  Speaking in full sentences.  C Cardiovascular system: Irregularly irregular.  No JVD, no murmurs rubs or gallops.   Gastrointestinal system: Abdomen is nondistended, soft and nontender. No organomegaly or masses felt. Normal bowel sounds heard. Central nervous system: Alert and oriented. No focal neurological deficits. Extremities: Symmetric 5 x 5 power. Skin: No rashes, lesions or ulcers Psychiatry: Judgement and insight appear normal. Mood & affect appropriate.     Data Reviewed: I have personally reviewed following labs and imaging studies  CBC: Recent Labs  Lab 08/28/21 2143 08/29/21 0225  WBC 9.4 9.1  NEUTROABS 6.8 7.7  HGB 13.5 13.7  HCT 41.7 41.9  MCV 93.5 93.7  PLT 145* 144*    Basic Metabolic Panel: Recent Labs  Lab 08/28/21 2143 08/29/21 0225  NA 137 137  K 3.9 4.2  CL 109 107  CO2 20* 20*  GLUCOSE 200* 184*  BUN 31* 30*  CREATININE 1.64* 1.48*  CALCIUM 8.6* 8.6*    GFR: CrCl cannot be calculated (Unknown ideal weight.).  Liver Function Tests: Recent Labs  Lab 08/28/21 2143 08/29/21 0225  AST 21 33  ALT 18 25  ALKPHOS 50 53  BILITOT 1.0 0.9  PROT 6.3* 6.2*  ALBUMIN 3.5 3.3*    CBG: No results for input(s): GLUCAP in the last 168 hours.   Recent Results (from the past 240 hour(s))  Resp Panel by RT-PCR (Flu A&B, Covid) Nasopharyngeal Swab     Status: Abnormal   Collection Time: 08/28/21  9:32 PM   Specimen: Nasopharyngeal Swab; Nasopharyngeal(NP) swabs in vial transport medium  Result Value Ref Range Status   SARS Coronavirus 2 by RT PCR NEGATIVE NEGATIVE Final    Comment: (NOTE) SARS-CoV-2 target nucleic acids are NOT DETECTED.  The SARS-CoV-2 RNA is generally detectable in upper respiratory specimens during the acute phase of infection. The lowest concentration of SARS-CoV-2 viral copies this assay can detect is 138 copies/mL. A negative result does not preclude  SARS-Cov-2 infection and should not be used as the sole basis for treatment or other patient management decisions. A negative result may occur with  improper specimen collection/handling, submission of specimen other than nasopharyngeal swab, presence of viral mutation(s) within the areas targeted by this assay, and inadequate number of viral copies(<138 copies/mL). A negative result must be combined with clinical observations, patient history, and epidemiological information. The expected result is Negative.  Fact Sheet for Patients:  EntrepreneurPulse.com.au  Fact Sheet for Healthcare Providers:  IncredibleEmployment.be  This test is no t yet approved or cleared by the [place] FDA and  has been authorized for detection and/or diagnosis of SARS-CoV-2 by FDA under an Emergency Use Authorization (EUA). This EUA will remain  in effect (meaning this test can be used) for the duration of the COVID-19 declaration under Section 564(b)(1) of the Act, 21 U.S.C.section 360bbb-3(b)(1), unless the authorization is terminated  or revoked sooner.       Influenza A by PCR POSITIVE (A) NEGATIVE Final   Influenza B by PCR NEGATIVE NEGATIVE Final    Comment: (NOTE) The Xpert Xpress SARS-CoV-2/FLU/RSV plus assay is intended as an aid in the diagnosis of influenza from Nasopharyngeal swab specimens and should  not be used as a sole basis for treatment. Nasal washings and aspirates are unacceptable for Xpert Xpress SARS-CoV-2/FLU/RSV testing.  Fact Sheet for Patients: EntrepreneurPulse.com.au  Fact Sheet for Healthcare Providers: IncredibleEmployment.be  This test is not yet approved or cleared by the [place] FDA and has been authorized for detection and/or diagnosis of SARS-CoV-2 by FDA under an Emergency Use Authorization (EUA). This EUA will remain in effect (meaning this test can be used) for the duration of  the COVID-19 declaration under Section 564(b)(1) of the Act, 21 U.S.C. section 360bbb-3(b)(1), unless the authorization is terminated or revoked.  Performed at [facility], [place] [address] [place], [place] [address]   MRSA Next Gen by PCR, Nasal     Status: None   Collection Time: 08/29/21  2:08 AM   Specimen: Nasal Mucosa; Nasal Swab  Result Value Ref Range Status   MRSA by PCR Next Gen NOT DETECTED NOT DETECTED Final    Comment: (NOTE) The GeneXpert MRSA Assay (FDA approved for NASAL specimens only), is one component of a comprehensive MRSA colonization surveillance program. It is not intended to diagnose MRSA infection nor to guide or monitor treatment for MRSA infections. Test performance is not FDA approved in patients less than 74 years old. Performed at [facility], [place] [address], [place], [place] [address]          Radiology Studies: CT Head Wo Contrast  Result Date: 08/28/2021 CLINICAL DATA:  Orthostatic changes and altered mental status. EXAM: CT HEAD WITHOUT CONTRAST TECHNIQUE: Contiguous axial images were obtained from the base of the skull through the vertex without intravenous contrast. COMPARISON:  September 08, 2016 FINDINGS: Brain: There is mild cerebral atrophy with widening of the extra-axial spaces and ventricular dilatation. There are areas of decreased attenuation within the white matter tracts of the supratentorial brain, consistent with microvascular disease changes. Small, bilateral chronic basal ganglia lacunar infarcts are noted. A chronic left occipital lobe infarct is also seen. Vascular: No hyperdense vessel or unexpected calcification. Skull: Normal. Negative for fracture or focal lesion. Sinuses/Orbits: No acute finding. Other: None. IMPRESSION: 1. Generalized cerebral atrophy. 2. No acute intracranial abnormality. Electronically Signed   By: Virgina Norfolk M.D.   On: 08/28/2021 21:58   DG Chest Port 1  View  Result Date: 08/28/2021 CLINICAL DATA:  Questionable sepsis.  Diagnosed with flu EXAM: PORTABLE CHEST 1 VIEW COMPARISON:  Chest x-ray 04/02/2021, CT chest 03/31/2021 FINDINGS: The heart and mediastinal contours are unchanged. Aortic calcification. No focal consolidation. Question retrocardiac airspace opacity. No pleural effusion. No pneumothorax. No acute osseous abnormality. IMPRESSION: Question retrocardiac airspace opacity. Electronically Signed   By: Iven Finn M.D.   On: 08/28/2021 21:46        Scheduled Meds:  apixaban  2.5 mg Oral BID   Chlorhexidine Gluconate Cloth  6 each Topical Q0600   fluticasone  2 spray Each Nare Daily   hydrocortisone sod succinate (SOLU-CORTEF) inj  50 mg Intravenous Q8H   levalbuterol  0.63 mg Nebulization BID   loratadine  10 mg Oral Daily   mouth rinse  15 mL Mouth Rinse BID   metoprolol succinate  25 mg Oral Daily   oseltamivir  30 mg Oral Daily   pantoprazole  40 mg Oral Q0600   rosuvastatin  10 mg Oral Daily   Continuous Infusions:  azithromycin Stopped (08/28/21 2352)   cefTRIAXone (ROCEPHIN)  IV Stopped (08/28/21 2304)   lactated ringers 125 mL/hr at 08/29/21 1124  LOS: 1 day    Time spent: 40 minutes    Irine Seal, MD [facility]   To contact the attending provider between 7A-7P or the covering provider during after hours 7P-7A, please log into the web site [email or web address] and access using universal [place] password for that web site. If you do not have the password, please call the hospital operator.  08/29/2021, 3:33 PM

## 2021-08-29 NOTE — Plan of Care (Signed)
  Problem: Education: Goal: Knowledge of General Education information will improve Description: Including pain rating scale, medication(s)/side effects and non-pharmacologic comfort measures Outcome: Progressing   Problem: Coping: Goal: Level of anxiety will decrease Outcome: Progressing   Problem: Safety: Goal: Ability to remain free from injury will improve Outcome: Progressing   

## 2021-08-30 ENCOUNTER — Inpatient Hospital Stay (HOSPITAL_COMMUNITY): Payer: Medicare Other

## 2021-08-30 DIAGNOSIS — R531 Weakness: Secondary | ICD-10-CM | POA: Diagnosis not present

## 2021-08-30 DIAGNOSIS — I10 Essential (primary) hypertension: Secondary | ICD-10-CM | POA: Diagnosis not present

## 2021-08-30 DIAGNOSIS — R7881 Bacteremia: Secondary | ICD-10-CM | POA: Diagnosis present

## 2021-08-30 DIAGNOSIS — I480 Paroxysmal atrial fibrillation: Secondary | ICD-10-CM | POA: Diagnosis not present

## 2021-08-30 DIAGNOSIS — J101 Influenza due to other identified influenza virus with other respiratory manifestations: Secondary | ICD-10-CM | POA: Diagnosis not present

## 2021-08-30 DIAGNOSIS — I4891 Unspecified atrial fibrillation: Secondary | ICD-10-CM

## 2021-08-30 DIAGNOSIS — A419 Sepsis, unspecified organism: Secondary | ICD-10-CM | POA: Diagnosis not present

## 2021-08-30 DIAGNOSIS — J189 Pneumonia, unspecified organism: Secondary | ICD-10-CM | POA: Diagnosis not present

## 2021-08-30 LAB — BASIC METABOLIC PANEL
Anion gap: 16 — ABNORMAL HIGH (ref 5–15)
BUN: 31 mg/dL — ABNORMAL HIGH (ref 8–23)
CO2: 18 mmol/L — ABNORMAL LOW (ref 22–32)
Calcium: 8.9 mg/dL (ref 8.9–10.3)
Chloride: 104 mmol/L (ref 98–111)
Creatinine, Ser: 1.58 mg/dL — ABNORMAL HIGH (ref 0.61–1.24)
GFR, Estimated: 41 mL/min — ABNORMAL LOW (ref >60)
Glucose, Bld: 155 mg/dL — ABNORMAL HIGH (ref 70–99)
Potassium: 3.9 mmol/L (ref 3.5–5.1)
Sodium: 138 mmol/L (ref 135–145)

## 2021-08-30 LAB — CBC WITH DIFFERENTIAL/PLATELET
Abs Immature Granulocytes: 0.18 10*3/uL — ABNORMAL HIGH (ref 0.00–0.07)
Basophils Absolute: 0 10*3/uL (ref 0.0–0.1)
Basophils Relative: 0 %
Eosinophils Absolute: 0 10*3/uL (ref 0.0–0.5)
Eosinophils Relative: 0 %
HCT: 41.1 % (ref 39.0–52.0)
Hemoglobin: 13.4 g/dL (ref 13.0–17.0)
Immature Granulocytes: 1 %
Lymphocytes Relative: 5 %
Lymphs Abs: 1 10*3/uL (ref 0.7–4.0)
MCH: 30.1 pg (ref 26.0–34.0)
MCHC: 32.6 g/dL (ref 30.0–36.0)
MCV: 92.4 fL (ref 80.0–100.0)
Monocytes Absolute: 1 10*3/uL (ref 0.1–1.0)
Monocytes Relative: 5 %
Neutro Abs: 16.7 10*3/uL — ABNORMAL HIGH (ref 1.7–7.7)
Neutrophils Relative %: 89 %
Platelets: 151 10*3/uL (ref 150–400)
RBC: 4.45 MIL/uL (ref 4.22–5.81)
RDW: 14.4 % (ref 11.5–15.5)
WBC: 18.8 10*3/uL — ABNORMAL HIGH (ref 4.0–10.5)
nRBC: 0 % (ref 0.0–0.2)

## 2021-08-30 LAB — URINE CULTURE: Culture: NO GROWTH

## 2021-08-30 LAB — ECHOCARDIOGRAM COMPLETE
AR max vel: 0.44 cm2
AV Area VTI: 0.5 cm2
AV Area mean vel: 0.41 cm2
AV Mean grad: 23.8 mmHg
AV Peak grad: 39.7 mmHg
Ao pk vel: 3.15 m/s
Area-P 1/2: 4.25 cm2
Calc EF: 48.7 %
Single Plane A2C EF: 53.5 %
Single Plane A4C EF: 38.6 %
Weight: 2405.66 oz

## 2021-08-30 LAB — HEMOGLOBIN A1C
Hgb A1c MFr Bld: 7.9 % — ABNORMAL HIGH (ref 4.8–5.6)
Mean Plasma Glucose: 180 mg/dL

## 2021-08-30 LAB — MAGNESIUM: Magnesium: 1.8 mg/dL (ref 1.7–2.4)

## 2021-08-30 LAB — TSH: TSH: 1.414 u[IU]/mL (ref 0.350–4.500)

## 2021-08-30 MED ORDER — AMIODARONE HCL IN DEXTROSE 360-4.14 MG/200ML-% IV SOLN
30.0000 mg/h | INTRAVENOUS | Status: AC
  Administered 2021-08-30 – 2021-09-01 (×5): 30 mg/h via INTRAVENOUS
  Filled 2021-08-30 (×4): qty 200

## 2021-08-30 MED ORDER — QUETIAPINE 12.5 MG HALF TABLET
12.5000 mg | ORAL_TABLET | Freq: Once | ORAL | Status: AC
  Administered 2021-08-30: 15:00:00 12.5 mg via ORAL
  Filled 2021-08-30 (×2): qty 1

## 2021-08-30 MED ORDER — PERFLUTREN LIPID MICROSPHERE
1.0000 mL | INTRAVENOUS | Status: AC | PRN
Start: 2021-08-30 — End: 2021-08-30
  Administered 2021-08-30: 13:00:00 2 mL via INTRAVENOUS
  Filled 2021-08-30: qty 10

## 2021-08-30 MED ORDER — VANCOMYCIN HCL 750 MG/150ML IV SOLN
750.0000 mg | INTRAVENOUS | Status: DC
  Administered 2021-08-31 – 2021-09-04 (×5): 750 mg via INTRAVENOUS
  Filled 2021-08-30 (×5): qty 150

## 2021-08-30 MED ORDER — VANCOMYCIN HCL 1500 MG/300ML IV SOLN
1500.0000 mg | Freq: Once | INTRAVENOUS | Status: AC
  Administered 2021-08-30: 01:00:00 1500 mg via INTRAVENOUS
  Filled 2021-08-30: qty 300

## 2021-08-30 MED ORDER — QUETIAPINE 12.5 MG HALF TABLET
12.5000 mg | ORAL_TABLET | Freq: Every day | ORAL | Status: DC
  Administered 2021-08-30: 23:00:00 12.5 mg via ORAL
  Filled 2021-08-30: qty 1

## 2021-08-30 MED ORDER — OSELTAMIVIR PHOSPHATE 30 MG PO CAPS
30.0000 mg | ORAL_CAPSULE | Freq: Two times a day (BID) | ORAL | Status: AC
  Administered 2021-08-30 – 2021-09-02 (×8): 30 mg via ORAL
  Filled 2021-08-30 (×8): qty 1

## 2021-08-30 MED ORDER — HYDROCORTISONE SOD SUC (PF) 100 MG IJ SOLR
25.0000 mg | Freq: Three times a day (TID) | INTRAMUSCULAR | Status: DC
  Administered 2021-08-30 – 2021-08-31 (×3): 25 mg via INTRAVENOUS
  Filled 2021-08-30 (×4): qty 2

## 2021-08-30 MED ORDER — AMIODARONE LOAD VIA INFUSION
150.0000 mg | Freq: Once | INTRAVENOUS | Status: AC
  Administered 2021-08-30: 17:00:00 150 mg via INTRAVENOUS
  Filled 2021-08-30: qty 83.34

## 2021-08-30 MED ORDER — AMIODARONE HCL IN DEXTROSE 360-4.14 MG/200ML-% IV SOLN
60.0000 mg/h | INTRAVENOUS | Status: AC
  Administered 2021-08-30 (×2): 60 mg/h via INTRAVENOUS
  Filled 2021-08-30 (×2): qty 200

## 2021-08-30 NOTE — Progress Notes (Signed)
PHARMACY - PHYSICIAN COMMUNICATION CRITICAL VALUE ALERT - BLOOD CULTURE IDENTIFICATION (BCID)  Casey Lee is an 87 y.o. male who presented to [facility] on 08/28/2021 with sepsis.  Assessment:  + Staph epidermidis with methicillin resistance detected in 2/4 bottles     Name of physician (or Provider) Contacted: X. Blount, FNP  Current antibiotics: Ceftriaxone and Azithromycin  Changes to prescribed antibiotics recommended:  Begin Vancomycin per pharmacy protocol  Results for orders placed or performed during the hospital encounter of 08/28/21  Blood Culture ID Panel (Reflexed) (Collected: 08/28/2021  9:38 PM)  Result Value Ref Range   Enterococcus faecalis NOT DETECTED NOT DETECTED   Enterococcus Faecium NOT DETECTED NOT DETECTED   Listeria monocytogenes NOT DETECTED NOT DETECTED   Staphylococcus species DETECTED (A) NOT DETECTED   Staphylococcus aureus (BCID) NOT DETECTED NOT DETECTED   Staphylococcus epidermidis DETECTED (A) NOT DETECTED   Staphylococcus lugdunensis NOT DETECTED NOT DETECTED   Streptococcus species NOT DETECTED NOT DETECTED   Streptococcus agalactiae NOT DETECTED NOT DETECTED   Streptococcus pneumoniae NOT DETECTED NOT DETECTED   Streptococcus pyogenes NOT DETECTED NOT DETECTED   A.calcoaceticus-baumannii NOT DETECTED NOT DETECTED   Bacteroides fragilis NOT DETECTED NOT DETECTED   Enterobacterales NOT DETECTED NOT DETECTED   Enterobacter cloacae complex NOT DETECTED NOT DETECTED   Escherichia coli NOT DETECTED NOT DETECTED   Klebsiella aerogenes NOT DETECTED NOT DETECTED   Klebsiella oxytoca NOT DETECTED NOT DETECTED   Klebsiella pneumoniae NOT DETECTED NOT DETECTED   Proteus species NOT DETECTED NOT DETECTED   Salmonella species NOT DETECTED NOT DETECTED   Serratia marcescens NOT DETECTED NOT DETECTED   Haemophilus influenzae NOT DETECTED NOT DETECTED   Neisseria meningitidis NOT DETECTED NOT DETECTED   Pseudomonas aeruginosa NOT DETECTED NOT  DETECTED   Stenotrophomonas maltophilia NOT DETECTED NOT DETECTED   Candida albicans NOT DETECTED NOT DETECTED   Candida auris NOT DETECTED NOT DETECTED   Candida glabrata NOT DETECTED NOT DETECTED   Candida krusei NOT DETECTED NOT DETECTED   Candida parapsilosis NOT DETECTED NOT DETECTED   Candida tropicalis NOT DETECTED NOT DETECTED   Cryptococcus neoformans/gattii NOT DETECTED NOT DETECTED   Methicillin resistance mecA/C DETECTED (A) NOT DETECTED    Astor Gentle, Toribio Harbour, PharmD 08/30/2021  12:07 AM

## 2021-08-30 NOTE — Progress Notes (Signed)
PT Cancellation Note  Patient Details Name: Casey Lee MRN: [id number] DOB: Feb 10, 1928   Cancelled Treatment:    Reason Eval/Treat Not Completed: Medical issues which prohibited therapy. Pt monitior indicating Afib-HR up to 125 bpm with pt in bed. Spoke with RN who requested PT be held at this time. Will check back as schedule allows.    [facility]  Office: [phone] Pager: [phone]

## 2021-08-30 NOTE — Progress Notes (Signed)
Sons [name] and Gerald Stabs expressed concerns about use of sedative meds with patient's history of somnolence. Discussed meds prescribed, especially new orders for Seroquel 12.5 mg. [name] and Gerald Stabs verbalized understanding for rationale of use, low dose, and extensive monitoring done with new meds to check for adverse reactions. [name] and Gerald Stabs agreed to allow Seroquel to be given with agreement that if he becomes too sedated that subsequent doses would be held. Safety sitter remains at bedside.

## 2021-08-30 NOTE — Progress Notes (Signed)
OT Cancellation Note  Patient Details Name: Casey Lee MRN: [id number] DOB: Dec 16, 1927   Cancelled Treatment:    Reason Eval/Treat Not Completed: Medical issues which prohibited therapy Patient is in a fib with nurse in room asking for therapy to hold off at this time. OT to continue to follow and check back as schedule allows.  Jackelyn Poling OTR/L, [facility] Office# [phone] Pager# [phone]   08/30/2021, 2:25 PM

## 2021-08-30 NOTE — Progress Notes (Addendum)
PROGRESS NOTE    Casey Lee  HKV:[id number] DOB: 1927/12/26 DOA: 08/28/2021 PCP: [name], MD   Chief Complaint  Patient presents with   Fever   Weakness    Brief Narrative:  Patient pleasant 87 year old male with history of temporal arteritis on prednisone, CKD stage IIIa, hyperlipidemia, A. fib presented with cough, subjective fevers of fever, chills, x3 days.  Patient worsening saw PCP prescribed baloxavir for influenza A.  COVID 19 PCR negative.  Due to mild confusion, generalized weakness patient presented to the ED.  On presentation to the ED patient noted to be hypotensive systolic blood pressures in the 80s, temp of 100.4, A. fib with RVR, chest x-ray consistent with retrosternal densities.  Pancultured.  Placed empirically on IV antibiotics for pneumonia and Tamiflu for influenza A.  Head CT unremarkable.  Repeat COVID test was negative..  Influenza a test positive.   Assessment & Plan:   Principal Problem:   Sepsis ([place]) Active Problems:   CAP (community acquired pneumonia)   Influenza A   Essential hypertension   HLD (hyperlipidemia)   Temporal arteritis (HCC)   PAF (paroxysmal atrial fibrillation) (HCC)   Generalized weakness   Bacteremia due to Gram-positive bacteria  1 sepsis secondary to pneumonia/bacteremia -Patient presented meeting criteria for sepsis with hypotension, fever, tachycardia, chest x-ray consistent with a pneumonia.  Influenza A PCR positive.  COVID-19 PCR negative. -Placed on stress dose steroids with some improvement with blood pressure. -Slowly improving clinically. -Currently afebrile. -Increasing leukocytosis likely due to steroids. -Blood cultures with staph epidermis.. -2D echo pending. -Urinalysis nitrite negative, leukocytes negative. -Continue azithromycin, IV Rocephin, IV vancomycin, Tamiflu, Xopenex nebs, Flonase, Claritin, PPI. -Consult with ID for further evaluation due to bacteremia.  2.  A. fib with RVR -Patient  noted to have heart rates which are sustaining in the 130s to the 150s yesterday evening and patient started on a Cardizem drip.   -Elevated heart rate likely secondary to acute infection. -Blood pressure borderline.   -Continue home regimen Toprol-XL.   -2D echo ordered and pending.   -Eliquis for anticoagulation.   -Cardiology consulted for further evaluation and management.   3.  Influenza A -Continue Tamiflu. -Continue Claritin, Flonase, PPI, Xopenex nebs.  4.  History of temporal arteritis -Noted to be on prednisolone 5 mg daily. -Due to presentation with sepsis, hypotension, patient placed on stress dose steroids' -Decrease IV Solu-Cortef to 25 mg every 8 hours.  -Prednisolone discontinued.    5.  Hyperlipidemia -Statin  6.  Acute metabolic encephalopathy -Felt secondary to sepsis, pneumonia, influenza A. -Patient also noted to have been up all night. -Patient with some confusion this morning. -Continue Tamiflu, antibiotics, hydration.   -Supportive care.   7.  Chronic thrombocytopenia -Stable.  8.  Chronic kidney disease stage IIIa -Stable.  Follow.  9.  Hyperglycemia -Likely steroid-induced. -Hemoglobin A1c 6.3 (01/25/2016) -Glucose level at 155 on labs this morning.   DVT prophylaxis: Eliquis Code Status: Full Family Communication: Updated patient, son at bedside. Disposition:   Status is: Inpatient  Remains inpatient appropriate because: Severity of illness       Consultants:  None  Procedures:  CT at 08/28/2021 Chest x-ray 08/28/2021  Antimicrobials:  IV azithromycin 08/28/2021>>>>> 09/02/2021 IV Rocephin 08/28/2021>>>>> 09/02/2021 IV vancomycin 08/30/2021>>>>   Subjective: Patient alert and oriented to self place and time.  Patient with some confusion per son this morning and he did not recognize him.  Patient noted to be up all night and states Getting interrupted.  States feels shortness of breath is improving.  No chest pain.  No  abdominal pain.  Patient noted to go into A. fib with RVR and placed on Cardizem drip overnight.    Objective: Vitals:   08/30/21 0700 08/30/21 0752 08/30/21 0819 08/30/21 0837  BP: 108/75  (!) 101/57   Pulse:      Resp: 18  18   Temp:    98.2 F (36.8 C)  TempSrc:    Axillary  SpO2: 93% 94% 92%   Weight:        Intake/Output Summary (Last 24 hours) at 08/30/2021 1042 Last data filed at 08/30/2021 0841 Gross per 24 hour  Intake 2052.83 ml  Output 600 ml  Net 1452.83 ml   Filed Weights   08/30/21 0000 08/30/21 0540  Weight: 68.2 kg 68.2 kg    Examination:  General exam: NAD. Respiratory system: Some scattered coarse rhonchorous breath sounds.  No wheezing.  Fair air movement.  No crackles.  Speaking in full sentences.  Cardiovascular system: Irregularly irregular.  No JVD.  No murmurs rubs or gallops.   Gastrointestinal system: Abdomen is soft, nontender, nondistended, positive bowel sounds.  No rebound.  No guarding.  Central nervous system: Alert and oriented.  Moving extremities spontaneously.  No focal neurological deficits. Extremities: Symmetric 5 x 5 power. Skin: No rashes, lesions or ulcers Psychiatry: Judgement and insight appear poor to fair. Mood & affect appropriate.     Data Reviewed: I have personally reviewed following labs and imaging studies  CBC: Recent Labs  Lab 08/28/21 2143 08/29/21 0225 08/30/21 0257  WBC 9.4 9.1 18.8*  NEUTROABS 6.8 7.7 16.7*  HGB 13.5 13.7 13.4  HCT 41.7 41.9 41.1  MCV 93.5 93.7 92.4  PLT 145* 144* 902    Basic Metabolic Panel: Recent Labs  Lab 08/28/21 2143 08/29/21 0225 08/30/21 0257  NA 137 137 138  K 3.9 4.2 3.9  CL 109 107 104  CO2 20* 20* 18*  GLUCOSE 200* 184* 155*  BUN 31* 30* 31*  CREATININE 1.64* 1.48* 1.58*  CALCIUM 8.6* 8.6* 8.9  MG  --   --  1.8    GFR: Estimated Creatinine Clearance: 29.4 mL/min (A) (by C-G formula based on SCr of 1.58 mg/dL (H)).  Liver Function Tests: Recent Labs  Lab  08/28/21 2143 08/29/21 0225  AST 21 33  ALT 18 25  ALKPHOS 50 53  BILITOT 1.0 0.9  PROT 6.3* 6.2*  ALBUMIN 3.5 3.3*    CBG: No results for input(s): GLUCAP in the last 168 hours.   Recent Results (from the past 240 hour(s))  Resp Panel by RT-PCR (Flu A&B, Covid) Nasopharyngeal Swab     Status: Abnormal   Collection Time: 08/28/21  9:32 PM   Specimen: Nasopharyngeal Swab; Nasopharyngeal(NP) swabs in vial transport medium  Result Value Ref Range Status   SARS Coronavirus 2 by RT PCR NEGATIVE NEGATIVE Final    Comment: (NOTE) SARS-CoV-2 target nucleic acids are NOT DETECTED.  The SARS-CoV-2 RNA is generally detectable in upper respiratory specimens during the acute phase of infection. The lowest concentration of SARS-CoV-2 viral copies this assay can detect is 138 copies/mL. A negative result does not preclude SARS-Cov-2 infection and should not be used as the sole basis for treatment or other patient management decisions. A negative result may occur with  improper specimen collection/handling, submission of specimen other than nasopharyngeal swab, presence of viral mutation(s) within the areas targeted by this assay, and inadequate number of viral copies(<138  copies/mL). A negative result must be combined with clinical observations, patient history, and epidemiological information. The expected result is Negative.  Fact Sheet for Patients:  EntrepreneurPulse.com.au  Fact Sheet for Healthcare Providers:  IncredibleEmployment.be  This test is no t yet approved or cleared by the [place] FDA and  has been authorized for detection and/or diagnosis of SARS-CoV-2 by FDA under an Emergency Use Authorization (EUA). This EUA will remain  in effect (meaning this test can be used) for the duration of the COVID-19 declaration under Section 564(b)(1) of the Act, 21 U.S.C.section 360bbb-3(b)(1), unless the authorization is terminated  or  revoked sooner.       Influenza A by PCR POSITIVE (A) NEGATIVE Final   Influenza B by PCR NEGATIVE NEGATIVE Final    Comment: (NOTE) The Xpert Xpress SARS-CoV-2/FLU/RSV plus assay is intended as an aid in the diagnosis of influenza from Nasopharyngeal swab specimens and should not be used as a sole basis for treatment. Nasal washings and aspirates are unacceptable for Xpert Xpress SARS-CoV-2/FLU/RSV testing.  Fact Sheet for Patients: EntrepreneurPulse.com.au  Fact Sheet for Healthcare Providers: IncredibleEmployment.be  This test is not yet approved or cleared by the [place] FDA and has been authorized for detection and/or diagnosis of SARS-CoV-2 by FDA under an Emergency Use Authorization (EUA). This EUA will remain in effect (meaning this test can be used) for the duration of the COVID-19 declaration under Section 564(b)(1) of the Act, 21 U.S.C. section 360bbb-3(b)(1), unless the authorization is terminated or revoked.  Performed at [facility], [place] [address], [place], [place] [address]   Blood Culture (routine x 2)     Status: None (Preliminary result)   Collection Time: 08/28/21  9:38 PM   Specimen: BLOOD  Result Value Ref Range Status   Specimen Description   Final    BLOOD LEFT ANTECUBITAL Performed at [facility] [address] [place], [place] [address]    Special Requests   Final    BOTTLES DRAWN AEROBIC AND ANAEROBIC Blood Culture results may not be optimal due to an excessive volume of blood received in culture bottles Performed at [facility] [address] [place], [place] [address]    Culture  Setup Time   Final    GRAM POSITIVE COCCI IN CLUSTERS ANAEROBIC BOTTLE ONLY CRITICAL RESULT CALLED TO, READ BACK BY AND VERIFIED WITH: Ramon Dredge [id number] FCP Performed at [facility], [place] [address] [place], [place] [address]    Culture GRAM  POSITIVE COCCI  Final   Report Status PENDING  Incomplete  Blood Culture (routine x 2)     Status: None (Preliminary result)   Collection Time: 08/28/21  9:38 PM   Specimen: BLOOD  Result Value Ref Range Status   Specimen Description   Final    BLOOD RIGHT ANTECUBITAL Performed at [facility] [address], [place], [place] [address]    Special Requests   Final    BOTTLES DRAWN AEROBIC AND ANAEROBIC Blood Culture results may not be optimal due to an excessive volume of blood received in culture bottles Performed at [facility] [address] [place],  [address]    Culture  Setup Time   Final    GRAM POSITIVE COCCI IN CLUSTERS IN BOTH AEROBIC AND ANAEROBIC BOTTLES CRITICAL RESULT CALLED TO, READ BACK BY AND VERIFIED WITH: Ramon Dredge [id number] FCP Performed at [facility], [place]  [address], [place], [place] [address]    Culture GRAM POSITIVE COCCI  Final   Report Status PENDING  Incomplete  Blood Culture ID Panel (Reflexed)     Status: Abnormal   Collection Time: 08/28/21  9:38 PM  Result Value Ref Range Status   Enterococcus faecalis NOT DETECTED NOT DETECTED Final   Enterococcus Faecium NOT DETECTED NOT DETECTED Final   Listeria monocytogenes NOT DETECTED NOT DETECTED Final   Staphylococcus species DETECTED (A) NOT DETECTED Final    Comment: CRITICAL RESULT CALLED TO, READ BACK BY AND VERIFIED WITH: PHARMD LEANNE P [phone] FCP    Staphylococcus aureus (BCID) NOT DETECTED NOT DETECTED Final   Staphylococcus epidermidis DETECTED (A) NOT DETECTED Final    Comment: Methicillin (oxacillin) resistant coagulase negative staphylococcus. Possible blood culture contaminant (unless isolated from more than one blood culture draw or clinical case suggests pathogenicity). No antibiotic treatment is indicated for blood  culture contaminants. CRITICAL RESULT CALLED TO, READ BACK BY AND VERIFIED WITH: PHARMD LEANNE P [phone]  FCP    Staphylococcus lugdunensis NOT DETECTED NOT DETECTED Final   Streptococcus species NOT DETECTED NOT DETECTED Final   Streptococcus agalactiae NOT DETECTED NOT DETECTED Final   Streptococcus pneumoniae NOT DETECTED NOT DETECTED Final   Streptococcus pyogenes NOT DETECTED NOT DETECTED Final   A.calcoaceticus-baumannii NOT DETECTED NOT DETECTED Final   Bacteroides fragilis NOT DETECTED NOT DETECTED Final   Enterobacterales NOT DETECTED NOT DETECTED Final   Enterobacter cloacae complex NOT DETECTED NOT DETECTED Final   Escherichia coli NOT DETECTED NOT DETECTED Final   Klebsiella aerogenes NOT DETECTED NOT DETECTED Final   Klebsiella oxytoca NOT DETECTED NOT DETECTED Final   Klebsiella pneumoniae NOT DETECTED NOT DETECTED Final   Proteus species NOT DETECTED NOT DETECTED Final   Salmonella species NOT DETECTED NOT DETECTED Final   Serratia marcescens NOT DETECTED NOT DETECTED Final   Haemophilus influenzae NOT DETECTED NOT DETECTED Final   Neisseria meningitidis NOT DETECTED NOT DETECTED Final   Pseudomonas aeruginosa NOT DETECTED NOT DETECTED Final   Stenotrophomonas maltophilia NOT DETECTED NOT DETECTED Final   Candida albicans NOT DETECTED NOT DETECTED Final   Candida auris NOT DETECTED NOT DETECTED Final   Candida glabrata NOT DETECTED NOT DETECTED Final   Candida krusei NOT DETECTED NOT DETECTED Final   Candida parapsilosis NOT DETECTED NOT DETECTED Final   Candida tropicalis NOT DETECTED NOT DETECTED Final   Cryptococcus neoformans/gattii NOT DETECTED NOT DETECTED Final   Methicillin resistance mecA/C DETECTED (A) NOT DETECTED Final    Comment: CRITICAL RESULT CALLED TO, READ BACK BY AND VERIFIED WITH: Ramon Dredge [id number] FCP Performed at [facility], [address] [place], [place] [address]   MRSA Next Gen by PCR, Nasal     Status: None   Collection Time: 08/29/21  2:08 AM   Specimen: Nasal Mucosa; Nasal Swab  Result Value Ref Range Status   MRSA by  PCR Next Gen NOT DETECTED NOT DETECTED Final    Comment: (NOTE) The GeneXpert MRSA Assay (FDA approved for NASAL specimens only), is one component of a comprehensive MRSA colonization surveillance program. It is not intended to diagnose MRSA infection nor to guide or monitor treatment for MRSA infections. Test performance is not FDA approved in patients less than 27 years old. Performed at [facility], [place] [address], [place], [place] [address]          Radiology Studies: CT Head Wo Contrast  Result Date: 08/28/2021 CLINICAL DATA:  Orthostatic changes and altered mental status. EXAM: CT HEAD WITHOUT CONTRAST TECHNIQUE: Contiguous axial images were obtained from the base of the skull through the vertex without intravenous contrast. COMPARISON:  September 08, 2016 FINDINGS: Brain: There is mild cerebral atrophy with widening of the extra-axial spaces and ventricular dilatation. There are areas of decreased attenuation within the white matter tracts of the supratentorial brain, consistent with microvascular disease changes. Small, bilateral chronic basal ganglia lacunar infarcts are noted. A chronic left occipital lobe infarct is also seen. Vascular: No hyperdense vessel or unexpected calcification. Skull: Normal. Negative for fracture or focal lesion. Sinuses/Orbits: No acute finding. Other: None. IMPRESSION: 1. Generalized cerebral atrophy. 2. No acute intracranial abnormality. Electronically Signed   By: Virgina Norfolk M.D.   On: 08/28/2021 21:58   DG Chest Port 1 View  Result Date: 08/28/2021 CLINICAL DATA:  Questionable sepsis.  Diagnosed with flu EXAM: PORTABLE CHEST 1 VIEW COMPARISON:  Chest x-ray 04/02/2021, CT chest 03/31/2021 FINDINGS: The heart and mediastinal contours are unchanged. Aortic calcification. No focal consolidation. Question retrocardiac airspace opacity. No pleural effusion. No pneumothorax. No acute osseous abnormality. IMPRESSION: Question  retrocardiac airspace opacity. Electronically Signed   By: Iven Finn M.D.   On: 08/28/2021 21:46        Scheduled Meds:  apixaban  2.5 mg Oral BID   Chlorhexidine Gluconate Cloth  6 each Topical Q0600   fluticasone  2 spray Each Nare Daily   hydrocortisone sod succinate (SOLU-CORTEF) inj  25 mg Intravenous Q8H   levalbuterol  0.63 mg Nebulization BID   loratadine  10 mg Oral Daily   mouth rinse  15 mL Mouth Rinse BID   melatonin  5 mg Oral Once   metoprolol succinate  25 mg Oral Daily   oseltamivir  30 mg Oral BID   pantoprazole  40 mg Oral Q0600   rosuvastatin  10 mg Oral Daily   Continuous Infusions:  azithromycin Stopped (08/30/21 0000)   cefTRIAXone (ROCEPHIN)  IV Stopped (08/29/21 2245)   diltiazem (CARDIZEM) infusion 5 mg/hr (08/30/21 0841)   [START ON 08/31/2021] vancomycin       LOS: 2 days    Time spent: 40 minutes    Irine Seal, MD [facility]   To contact the attending provider between 7A-7P or the covering provider during after hours 7P-7A, please log into the web site [email or web address] and access using universal [place] password for that web site. If you do not have the password, please call the hospital operator.  08/30/2021, 10:42 AM

## 2021-08-30 NOTE — Consult Note (Signed)
[facility] for Infectious Disease  Total days of antibiotics 3       Reason for Consult:bacteremia/flu    Referring Physician: thompson  Principal Problem:   Sepsis ([place]) Active Problems:   Essential hypertension   HLD (hyperlipidemia)   Temporal arteritis (HCC)   PAF (paroxysmal atrial fibrillation) (HCC)   CAP (community acquired pneumonia)   Influenza A   Generalized weakness   Bacteremia due to Gram-positive bacteria    HPI: Casey Lee is a 87 y.o. male with hx of HTN, AFIB, CKD3, HLD started to have cough on Thursday night, subsequently worsening with chills, and acute mental status changes. He was given baloxavir but unable to take due to quick deconditioned state of health, taken to the hospital via EMS. AT the ED, he was hypotensive with sBP in 80s, found to be in afib with RVR. Elevated LA of 2.6, fevers of 100.4. + for flu/- for covid. CXR possibly retrocardiac opacity and not necessarily hypoxic. He was started on fluid and broad spectrum abtx with CAP plus vancomycin. His infectious work up revealed 3/4 bottles with MRSE. ID asked to weigh in. 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 His wife was exposed to patient through Saturday (she is nonagenarian who is ESRD). He is feeling slightly better. Remains in ICU with diltiazem gtt.  Past Medical History:  Diagnosis Date   Atrial fibrillation (HCC)    Hearing loss    High cholesterol    Temporal arteritis (HCC)    TIA (transient ischemic attack)    UTI (lower urinary tract infection)    Vertigo     Allergies:  Allergies  Allergen Reactions   Fosamax [Alendronate] Other (See Comments)    SEVERE JAW(BONE) PAIN   Clopidogrel     Other reaction(s): Other (See Comments)    MEDICATIONS:  apixaban  2.5 mg Oral BID   Chlorhexidine Gluconate Cloth  6 each Topical Q0600   fluticasone  2 spray Each Nare Daily   hydrocortisone sod succinate (SOLU-CORTEF) inj  25 mg  Intravenous Q8H   levalbuterol  0.63 mg Nebulization BID   loratadine  10 mg Oral Daily   mouth rinse  15 mL Mouth Rinse BID   melatonin  5 mg Oral Once   metoprolol succinate  25 mg Oral Daily   oseltamivir  30 mg Oral BID   pantoprazole  40 mg Oral Q0600   rosuvastatin  10 mg Oral Daily    Social History   Tobacco Use   Smoking status: Former    Types: Cigarettes    Quit date: 11/07/1952    Years since quitting: 68.8   Smokeless tobacco: Never  Vaping Use   Vaping Use: Never used  Substance Use Topics   Alcohol use: No    Alcohol/week: 0.0 standard drinks   Drug use: No    Family History  Problem Relation Age of Onset   Rheum arthritis Father    Heart failure Father        Deceased, 69   Rheum arthritis Sister    Diabetes Mellitus II Sister    Healthy Son        x2    Review of Systems -  12 point ros is negative except what is mentioned in hpi  OBJECTIVE: Temp:  [97.5 F (36.4 C)-98.6 F (37 C)] 97.5 F (36.4 C) (11/28 1201) Pulse Rate:  [64-150] 116 (11/27 2200) Resp:  [18-40] 22 (11/28 1100)  BP: (85-140)/(54-112) 107/66 (11/28 1100) SpO2:  [92 %-100 %] 92 % (11/28 1100) Weight:  [68.2 kg] 68.2 kg (11/28 0540) Physical Exam  Constitutional: He is oriented to person, place, and time. He appears well-developed and well-nourished. No distress.  HENT:  Mouth/Throat: Oropharynx is clear and moist. No oropharyngeal exudate.  Cardiovascular: Normal rate, regular rhythm and normal heart sounds. Exam reveals no gallop and no friction rub.  No murmur heard.  Pulmonary/Chest: Effort normal and breath sounds normal. No respiratory distress. He has no wheezes.  Abdominal: Soft. Bowel sounds are normal. He exhibits no distension. There is no tenderness.  Lymphadenopathy:  He has no cervical adenopathy.  Neurological: He is alert and oriented to person, place, and time.  Skin: Skin is warm and dry. No rash noted. No erythema.  Psychiatric: He has a normal mood and  affect. His behavior is normal.    LABS: Results for orders placed or performed during the hospital encounter of 08/28/21 (from the past 48 hour(s))  Resp Panel by RT-PCR (Flu A&B, Covid) Nasopharyngeal Swab     Status: Abnormal   Collection Time: 08/28/21  9:32 PM   Specimen: Nasopharyngeal Swab; Nasopharyngeal(NP) swabs in vial transport medium  Result Value Ref Range   SARS Coronavirus 2 by RT PCR NEGATIVE NEGATIVE    Comment: (NOTE) SARS-CoV-2 target nucleic acids are NOT DETECTED.  The SARS-CoV-2 RNA is generally detectable in upper respiratory specimens during the acute phase of infection. The lowest concentration of SARS-CoV-2 viral copies this assay can detect is 138 copies/mL. A negative result does not preclude SARS-Cov-2 infection and should not be used as the sole basis for treatment or other patient management decisions. A negative result may occur with  improper specimen collection/handling, submission of specimen other than nasopharyngeal swab, presence of viral mutation(s) within the areas targeted by this assay, and inadequate number of viral copies(<138 copies/mL). A negative result must be combined with clinical observations, patient history, and epidemiological information. The expected result is Negative.  Fact Sheet for Patients:  EntrepreneurPulse.com.au  Fact Sheet for Healthcare Providers:  IncredibleEmployment.be  This test is no t yet approved or cleared by the [place] FDA and  has been authorized for detection and/or diagnosis of SARS-CoV-2 by FDA under an Emergency Use Authorization (EUA). This EUA will remain  in effect (meaning this test can be used) for the duration of the COVID-19 declaration under Section 564(b)(1) of the Act, 21 U.S.C.section 360bbb-3(b)(1), unless the authorization is terminated  or revoked sooner.       Influenza A by PCR POSITIVE (A) NEGATIVE   Influenza B by PCR NEGATIVE  NEGATIVE    Comment: (NOTE) The Xpert Xpress SARS-CoV-2/FLU/RSV plus assay is intended as an aid in the diagnosis of influenza from Nasopharyngeal swab specimens and should not be used as a sole basis for treatment. Nasal washings and aspirates are unacceptable for Xpert Xpress SARS-CoV-2/FLU/RSV testing.  Fact Sheet for Patients: EntrepreneurPulse.com.au  Fact Sheet for Healthcare Providers: IncredibleEmployment.be  This test is not yet approved or cleared by the [place] FDA and has been authorized for detection and/or diagnosis of SARS-CoV-2 by FDA under an Emergency Use Authorization (EUA). This EUA will remain in effect (meaning this test can be used) for the duration of the COVID-19 declaration under Section 564(b)(1) of the Act, 21 U.S.C. section 360bbb-3(b)(1), unless the authorization is terminated or revoked.  Performed at [facility], [place] [address], [place], [place] [address]   Blood Culture (routine x  2)     Status: Abnormal (Preliminary result)   Collection Time: 08/28/21  9:38 PM   Specimen: BLOOD  Result Value Ref Range   Specimen Description      BLOOD LEFT ANTECUBITAL Performed at [facility], [place] [address] [place], [place] [address]    Special Requests      BOTTLES DRAWN AEROBIC AND ANAEROBIC Blood Culture results may not be optimal due to an excessive volume of blood received in culture bottles Performed at [facility] [address] [place], [place] [address]    Culture  Setup Time      GRAM POSITIVE COCCI IN CLUSTERS ANAEROBIC BOTTLE ONLY CRITICAL RESULT CALLED TO, READ BACK BY AND VERIFIED WITH: PHARMD LEANNE P [phone] [id number] FCP    Culture (A)     STAPHYLOCOCCUS EPIDERMIDIS SUSCEPTIBILITIES TO FOLLOW Performed at [facility], [place] [address], [place], [place] [address]    Report Status PENDING   Blood Culture (routine x 2)     Status:  Abnormal (Preliminary result)   Collection Time: 08/28/21  9:38 PM   Specimen: BLOOD  Result Value Ref Range   Specimen Description      BLOOD RIGHT ANTECUBITAL Performed at [facility] [address] [place], [place] [address]    Special Requests      BOTTLES DRAWN AEROBIC AND ANAEROBIC Blood Culture results may not be optimal due to an excessive volume of blood received in culture bottles Performed at [facility] [address], [place], [place] [address]    Culture  Setup Time      GRAM POSITIVE COCCI IN CLUSTERS IN BOTH AEROBIC AND ANAEROBIC BOTTLES CRITICAL RESULT CALLED TO, READ BACK BY AND VERIFIED WITH: Ramon Dredge [id number] FCP Performed at [facility], [place] [address], [place], [place] [address]    Culture STAPHYLOCOCCUS EPIDERMIDIS (A)    Report Status PENDING   Blood Culture ID Panel (Reflexed)     Status: Abnormal   Collection Time: 08/28/21  9:38 PM  Result Value Ref Range   Enterococcus faecalis NOT DETECTED NOT DETECTED   Enterococcus Faecium NOT DETECTED NOT DETECTED   Listeria monocytogenes NOT DETECTED NOT DETECTED   Staphylococcus species DETECTED (A) NOT DETECTED    Comment: CRITICAL RESULT CALLED TO, READ BACK BY AND VERIFIED WITH: PHARMD LEANNE P [phone] FCP    Staphylococcus aureus (BCID) NOT DETECTED NOT DETECTED   Staphylococcus epidermidis DETECTED (A) NOT DETECTED    Comment: Methicillin (oxacillin) resistant coagulase negative staphylococcus. Possible blood culture contaminant (unless isolated from more than one blood culture draw or clinical case suggests pathogenicity). No antibiotic treatment is indicated for blood  culture contaminants. CRITICAL RESULT CALLED TO, READ BACK BY AND VERIFIED WITH: PHARMD LEANNE P [phone] FCP    Staphylococcus lugdunensis NOT DETECTED NOT DETECTED   Streptococcus species NOT DETECTED NOT DETECTED   Streptococcus agalactiae NOT DETECTED NOT DETECTED    Streptococcus pneumoniae NOT DETECTED NOT DETECTED   Streptococcus pyogenes NOT DETECTED NOT DETECTED   A.calcoaceticus-baumannii NOT DETECTED NOT DETECTED   Bacteroides fragilis NOT DETECTED NOT DETECTED   Enterobacterales NOT DETECTED NOT DETECTED   Enterobacter cloacae complex NOT DETECTED NOT DETECTED   Escherichia coli NOT DETECTED NOT DETECTED   Klebsiella aerogenes NOT DETECTED NOT DETECTED   Klebsiella oxytoca NOT DETECTED NOT DETECTED   Klebsiella pneumoniae NOT DETECTED NOT DETECTED   Proteus species NOT DETECTED NOT DETECTED  Salmonella species NOT DETECTED NOT DETECTED   Serratia marcescens NOT DETECTED NOT DETECTED   Haemophilus influenzae NOT DETECTED NOT DETECTED   Neisseria meningitidis NOT DETECTED NOT DETECTED   Pseudomonas aeruginosa NOT DETECTED NOT DETECTED   Stenotrophomonas maltophilia NOT DETECTED NOT DETECTED   Candida albicans NOT DETECTED NOT DETECTED   Candida auris NOT DETECTED NOT DETECTED   Candida glabrata NOT DETECTED NOT DETECTED   Candida krusei NOT DETECTED NOT DETECTED   Candida parapsilosis NOT DETECTED NOT DETECTED   Candida tropicalis NOT DETECTED NOT DETECTED   Cryptococcus neoformans/gattii NOT DETECTED NOT DETECTED   Methicillin resistance mecA/C DETECTED (A) NOT DETECTED    Comment: CRITICAL RESULT CALLED TO, READ BACK BY AND VERIFIED WITH: Ramon Dredge [id number] FCP Performed at [facility], [place] [address], [place], [place] [address]   Lactic acid, plasma     Status: Abnormal   Collection Time: 08/28/21  9:43 PM  Result Value Ref Range   Lactic Acid, Venous 2.3 (HH) 0.5 - 1.9 mmol/L    Comment: CRITICAL RESULT CALLED TO, READ BACK BY AND VERIFIED WITH: GREEN F @2234  BY BATTLET Performed at [facility] [address] [place], [place] [address]   Comprehensive metabolic panel     Status: Abnormal   Collection Time: 08/28/21  9:43 PM  Result Value Ref Range   Sodium 137 135 - 145 mmol/L   Potassium  3.9 3.5 - 5.1 mmol/L   Chloride 109 98 - 111 mmol/L   CO2 20 (L) 22 - 32 mmol/L   Glucose, Bld 200 (H) 70 - 99 mg/dL    Comment: Glucose reference range applies only to samples taken after fasting for at least 8 hours.   BUN 31 (H) 8 - 23 mg/dL   Creatinine, Ser 1.64 (H) 0.61 - 1.24 mg/dL   Calcium 8.6 (L) 8.9 - 10.3 mg/dL   Total Protein 6.3 (L) 6.5 - 8.1 g/dL   Albumin 3.5 3.5 - 5.0 g/dL   AST 21 15 - 41 U/L   ALT 18 0 - 44 U/L   Alkaline Phosphatase 50 38 - 126 U/L   Total Bilirubin 1.0 0.3 - 1.2 mg/dL   GFR, Estimated 39 (L) >60 mL/min    Comment: (NOTE) Calculated using the CKD-EPI Creatinine Equation (2021)    Anion gap 8 5 - 15    Comment: Performed at [facility], [place] [address] [place], [place] [address]  CBC WITH DIFFERENTIAL     Status: Abnormal   Collection Time: 08/28/21  9:43 PM  Result Value Ref Range   WBC 9.4 4.0 - 10.5 K/uL   RBC 4.46 4.22 - 5.81 MIL/uL   Hemoglobin 13.5 13.0 - 17.0 g/dL   HCT 41.7 39.0 - 52.0 %   MCV 93.5 80.0 - 100.0 fL   MCH 30.3 26.0 - 34.0 pg   MCHC 32.4 30.0 - 36.0 g/dL   RDW 14.6 11.5 - 15.5 %   Platelets 145 (L) 150 - 400 K/uL   nRBC 0.0 0.0 - 0.2 %   Neutrophils Relative % 72 %   Neutro Abs 6.8 1.7 - 7.7 K/uL   Lymphocytes Relative 15 %   Lymphs Abs 1.5 0.7 - 4.0 K/uL   Monocytes Relative 9 %   Monocytes Absolute 0.9 0.1 - 1.0 K/uL   Eosinophils Relative 2 %   Eosinophils Absolute 0.2 0.0 - 0.5 K/uL   Basophils Relative 1 %   Basophils Absolute 0.1  0.0 - 0.1 K/uL   Immature Granulocytes 1 %   Abs Immature Granulocytes 0.06 0.00 - 0.07 K/uL    Comment: Performed at [facility], [place] [address] [place], [place] [address]  Protime-INR     Status: Abnormal   Collection Time: 08/28/21  9:43 PM  Result Value Ref Range   Prothrombin Time 15.5 (H) 11.4 - 15.2 seconds   INR 1.2 0.8 - 1.2    Comment: (NOTE) INR goal varies based on device and disease states. Performed at [facility], [place] [address], [place], [place] [address]   APTT     Status: None   Collection Time: 08/28/21  9:43 PM  Result Value Ref Range   aPTT 31 24 - 36 seconds    Comment: Performed at [facility], [place] [address], [place], [place] [address]  Lactic acid, plasma     Status: Abnormal   Collection Time: 08/29/21  1:39 AM  Result Value Ref Range   Lactic Acid, Venous 2.6 (HH) 0.5 - 1.9 mmol/L    Comment: CRITICAL RESULT CALLED TO, READ BACK BY AND VERIFIED WITH: GRIFFIN C @0216  BY BATTLET Performed at [facility] [address], [place], [place] [address]   MRSA Next Gen by PCR, Nasal     Status: None   Collection Time: 08/29/21  2:08 AM   Specimen: Nasal Mucosa; Nasal Swab  Result Value Ref Range   MRSA by PCR Next Gen NOT DETECTED NOT DETECTED    Comment: (NOTE) The GeneXpert MRSA Assay (FDA approved for NASAL specimens only), is one component of a comprehensive MRSA colonization surveillance program. It is not intended to diagnose MRSA infection nor to guide or monitor treatment for MRSA infections. Test performance is not FDA approved in patients less than 44 years old. Performed at [facility], [place] [address], [place], [place] [address]   CBC with Differential     Status: Abnormal   Collection Time: 08/29/21  2:25 AM  Result Value Ref Range   WBC 9.1 4.0 - 10.5 K/uL   RBC 4.47 4.22 - 5.81 MIL/uL   Hemoglobin 13.7 13.0 - 17.0 g/dL   HCT 41.9 39.0 - 52.0 %   MCV 93.7 80.0 - 100.0 fL   MCH 30.6 26.0 - 34.0 pg   MCHC 32.7 30.0 - 36.0 g/dL   RDW 14.6 11.5 - 15.5 %   Platelets 144 (L) 150 - 400 K/uL   nRBC 0.0 0.0 - 0.2 %   Neutrophils Relative % 84 %   Neutro Abs 7.7 1.7 - 7.7 K/uL   Lymphocytes Relative 8 %   Lymphs Abs 0.7 0.7 - 4.0 K/uL   Monocytes Relative 6 %   Monocytes Absolute 0.6 0.1 - 1.0 K/uL   Eosinophils Relative 0 %   Eosinophils Absolute 0.0 0.0 - 0.5 K/uL   Basophils Relative 1 %    Basophils Absolute 0.1 0.0 - 0.1 K/uL   Immature Granulocytes 1 %   Abs Immature Granulocytes 0.06 0.00 - 0.07 K/uL    Comment: Performed at [facility], [place] [address], [place], [place] [address]  Comprehensive metabolic panel     Status: Abnormal   Collection Time: 08/29/21  2:25 AM  Result Value Ref Range   Sodium 137 135 - 145 mmol/L   Potassium 4.2 3.5 - 5.1 mmol/L   Chloride 107 98 - 111 mmol/L   CO2 20 (L) 22 - 32 mmol/L  Glucose, Bld 184 (H) 70 - 99 mg/dL    Comment: Glucose reference range applies only to samples taken after fasting for at least 8 hours.   BUN 30 (H) 8 - 23 mg/dL   Creatinine, Ser 1.48 (H) 0.61 - 1.24 mg/dL   Calcium 8.6 (L) 8.9 - 10.3 mg/dL   Total Protein 6.2 (L) 6.5 - 8.1 g/dL   Albumin 3.3 (L) 3.5 - 5.0 g/dL   AST 33 15 - 41 U/L   ALT 25 0 - 44 U/L   Alkaline Phosphatase 53 38 - 126 U/L   Total Bilirubin 0.9 0.3 - 1.2 mg/dL   GFR, Estimated 44 (L) >60 mL/min    Comment: (NOTE) Calculated using the CKD-EPI Creatinine Equation (2021)    Anion gap 10 5 - 15    Comment: Performed at [facility], [place] [address] [place], [place] [address]  Lactic acid, plasma     Status: None   Collection Time: 08/29/21  2:25 AM  Result Value Ref Range   Lactic Acid, Venous 1.8 0.5 - 1.9 mmol/L    Comment: Performed at [facility], [place] [address], [place], [place] [address]  Hemoglobin A1c     Status: Abnormal   Collection Time: 08/29/21  2:25 AM  Result Value Ref Range   Hgb A1c MFr Bld 7.9 (H) 4.8 - 5.6 %    Comment: (NOTE)         Prediabetes: 5.7 - 6.4         Diabetes: >6.4         Glycemic control for adults with diabetes: <7.0    Mean Plasma Glucose 180 mg/dL    Comment: (NOTE) Performed At: [facility] [place], [place] [id number] Rush Farmer MD AS:[id number]   Urinalysis, Routine w reflex microscopic Urine, Catheterized     Status: Abnormal   Collection Time:  08/29/21  1:20 PM  Result Value Ref Range   Color, Urine YELLOW YELLOW   APPearance CLEAR CLEAR   Specific Gravity, Urine 1.019 1.005 - 1.030   pH 5.0 5.0 - 8.0   Glucose, UA 50 (A) NEGATIVE mg/dL   Hgb urine dipstick NEGATIVE NEGATIVE   Bilirubin Urine NEGATIVE NEGATIVE   Ketones, ur 20 (A) NEGATIVE mg/dL   Protein, ur NEGATIVE NEGATIVE mg/dL   Nitrite NEGATIVE NEGATIVE   Leukocytes,Ua NEGATIVE NEGATIVE    Comment: Performed at [facility], [place] [address] [place], [place] [address]  CBC with Differential/Platelet     Status: Abnormal   Collection Time: 08/30/21  2:57 AM  Result Value Ref Range   WBC 18.8 (H) 4.0 - 10.5 K/uL   RBC 4.45 4.22 - 5.81 MIL/uL   Hemoglobin 13.4 13.0 - 17.0 g/dL   HCT 41.1 39.0 - 52.0 %   MCV 92.4 80.0 - 100.0 fL   MCH 30.1 26.0 - 34.0 pg   MCHC 32.6 30.0 - 36.0 g/dL   RDW 14.4 11.5 - 15.5 %   Platelets 151 150 - 400 K/uL   nRBC 0.0 0.0 - 0.2 %   Neutrophils Relative % 89 %   Neutro Abs 16.7 (H) 1.7 - 7.7 K/uL   Lymphocytes Relative 5 %   Lymphs Abs 1.0 0.7 - 4.0 K/uL   Monocytes Relative 5 %   Monocytes Absolute 1.0 0.1 - 1.0 K/uL   Eosinophils Relative 0 %   Eosinophils Absolute 0.0 0.0 - 0.5 K/uL   Basophils Relative 0 %  Basophils Absolute 0.0 0.0 - 0.1 K/uL   Immature Granulocytes 1 %   Abs Immature Granulocytes 0.18 (H) 0.00 - 0.07 K/uL    Comment: Performed at [facility], [place] [address] [place], [place] [address]  Basic metabolic panel     Status: Abnormal   Collection Time: 08/30/21  2:57 AM  Result Value Ref Range   Sodium 138 135 - 145 mmol/L   Potassium 3.9 3.5 - 5.1 mmol/L   Chloride 104 98 - 111 mmol/L   CO2 18 (L) 22 - 32 mmol/L   Glucose, Bld 155 (H) 70 - 99 mg/dL    Comment: Glucose reference range applies only to samples taken after fasting for at least 8 hours.   BUN 31 (H) 8 - 23 mg/dL   Creatinine, Ser 1.58 (H) 0.61 - 1.24 mg/dL   Calcium 8.9 8.9 - 10.3 mg/dL   GFR, Estimated  41 (L) >60 mL/min    Comment: (NOTE) Calculated using the CKD-EPI Creatinine Equation (2021)    Anion gap 16 (H) 5 - 15    Comment: Performed at [facility], [place] [address], [place], [place] [address]  Magnesium     Status: None   Collection Time: 08/30/21  2:57 AM  Result Value Ref Range   Magnesium 1.8 1.7 - 2.4 mg/dL    Comment: Performed at [facility], [place] [address] [place], [place] [address]  TSH     Status: None   Collection Time: 08/30/21  2:57 AM  Result Value Ref Range   TSH 1.414 0.350 - 4.500 uIU/mL    Comment: Performed by a 3rd Generation assay with a functional sensitivity of <=0.01 uIU/mL. Performed at [facility], [place] [address] [place], [place] [address]     MICRO: 11/26 blood cx MRSE IMAGING: CT Head Wo Contrast  Result Date: 08/28/2021 CLINICAL DATA:  Orthostatic changes and altered mental status. EXAM: CT HEAD WITHOUT CONTRAST TECHNIQUE: Contiguous axial images were obtained from the base of the skull through the vertex without intravenous contrast. COMPARISON:  September 08, 2016 FINDINGS: Brain: There is mild cerebral atrophy with widening of the extra-axial spaces and ventricular dilatation. There are areas of decreased attenuation within the white matter tracts of the supratentorial brain, consistent with microvascular disease changes. Small, bilateral chronic basal ganglia lacunar infarcts are noted. A chronic left occipital lobe infarct is also seen. Vascular: No hyperdense vessel or unexpected calcification. Skull: Normal. Negative for fracture or focal lesion. Sinuses/Orbits: No acute finding. Other: None. IMPRESSION: 1. Generalized cerebral atrophy. 2. No acute intracranial abnormality. Electronically Signed   By: Virgina Norfolk M.D.   On: 08/28/2021 21:58   DG Chest Port 1 View  Result Date: 08/28/2021 CLINICAL DATA:  Questionable sepsis.  Diagnosed with flu EXAM: PORTABLE CHEST 1 VIEW  COMPARISON:  Chest x-ray 04/02/2021, CT chest 03/31/2021 FINDINGS: The heart and mediastinal contours are unchanged. Aortic calcification. No focal consolidation. Question retrocardiac airspace opacity. No pleural effusion. No pneumothorax. No acute osseous abnormality. IMPRESSION: Question retrocardiac airspace opacity. Electronically Signed   By: Iven Finn M.D.   On: 08/28/2021 21:46   ECHOCARDIOGRAM COMPLETE  Result Date: 08/30/2021    ECHOCARDIOGRAM REPORT   Patient Name:   Casey Lee [facility] Date of Exam: 08/30/2021 Medical Rec #:  [id number]        Height:       69.0 in Accession #:    [id number]       Weight:  150.4 lb Date of Birth:  September 04, 1928       BSA:          1.830 m Patient Age:    89 years         BP:           85/60 mmHg Patient Gender: M                HR:           90 bpm. Exam Location:  Inpatient Procedure: 2D Echo, Cardiac Doppler, Color Doppler and Intracardiac            Opacification Agent Indications:    I48.91* Unspeicified atrial fibrillation  History:        Patient has no prior history of Echocardiogram examinations.                 Risk Factors:Hypertension.  Sonographer:    Glo Herring Referring Phys: [id number] DANIEL V THOMPSON IMPRESSIONS  1. POor acoustic windows limit study. Laterall hypokinesis (mid/distal), basal inferior hypokinesis.. Left ventricular ejection fraction, by estimation, is 45 to 50%. The left ventricle has mildly decreased function. Left ventricular diastolic parameters are indeterminate.  2. Right ventricular systolic function is normal. The right ventricular size is normal.  3. Mitral regurgitation is eccentric directed along posterior wall of left atrium . Mild to moderate mitral valve regurgitation.  4. AV is thickened, calcified with restricted motion Peak and mean gradients through the valve are 42 and 24 mm Hg respectively. Stroke volume index is 17, suggesting this may be low gradient AS. Dimensionless index is 18, consistent with severe AS>.  The aortic valve was not well visualized. Aortic valve regurgitation is not visualized.  5. The inferior vena cava is normal in size with <50% respiratory variability, suggesting right atrial pressure of 8 mmHg. FINDINGS  Left Ventricle: POor acoustic windows limit study. Laterall hypokinesis (mid/distal), basal inferior hypokinesis. Left ventricular ejection fraction, by estimation, is 45 to 50%. The left ventricle has mildly decreased function. The left ventricular internal cavity size was normal in size. Left ventricular diastolic parameters are indeterminate. Right Ventricle: The right ventricular size is normal. Right vetricular wall thickness was not assessed. Right ventricular systolic function is normal. Left Atrium: Left atrial size was normal in size. Right Atrium: Right atrial size was normal in size. Pericardium: There is no evidence of pericardial effusion. Mitral Valve: Mitral regurgitation is eccentric directed along posterior wall of left atrium. There is mild thickening of the mitral valve leaflet(s). Mild mitral annular calcification. Mild to moderate mitral valve regurgitation. Tricuspid Valve: The tricuspid valve is normal in structure. Tricuspid valve regurgitation is mild. Aortic Valve: AV is thickened, calcified with restricted motion Peak and mean gradients through the valve are 42 and 24 mm Hg respectively. Stroke volume index is 17, suggesting this may be low gradient AS. Dimensionless index is 18, consistent with severe AS>. The aortic valve was not well visualized. Aortic valve regurgitation is not visualized. Aortic valve mean gradient measures 23.8 mmHg. Aortic valve peak gradient measures 39.7 mmHg. Aortic valve area, by VTI measures 0.50 cm. Pulmonic Valve: The pulmonic valve was not well visualized. Pulmonic valve regurgitation is not visualized. No evidence of pulmonic stenosis. Aorta: The aortic root and ascending aorta are structurally normal, with no evidence of dilitation.  Venous: The inferior vena cava is normal in size with less than 50% respiratory variability, suggesting right atrial pressure of 8 mmHg. IAS/Shunts: No atrial level shunt detected by color  flow Doppler.  LEFT VENTRICLE PLAX 2D LVOT diam:     1.90 cm     Diastology LV SV:         31          LV e' medial:    6.71 cm/s LV SV Index:   17          LV E/e' medial:  19.4 LVOT Area:     2.84 cm    LV e' lateral:   6.89 cm/s                            LV E/e' lateral: 18.9  LV Volumes (MOD) LV vol d, MOD A2C: 64.5 ml LV vol d, MOD A4C: 55.5 ml LV vol s, MOD A2C: 30.0 ml LV vol s, MOD A4C: 34.1 ml LV SV MOD A2C:     34.5 ml LV SV MOD A4C:     55.5 ml LV SV MOD BP:      30.5 ml RIGHT VENTRICLE          IVC RV Basal diam:  3.20 cm  IVC diam: 1.90 cm LEFT ATRIUM             Index        RIGHT ATRIUM           Index LA Vol (A2C):   42.8 ml 23.39 ml/m  RA Area:     16.50 cm LA Vol (A4C):   39.0 ml 21.31 ml/m  RA Volume:   45.50 ml  24.86 ml/m LA Biplane Vol: 41.1 ml 22.46 ml/m  AORTIC VALVE                     PULMONIC VALVE AV Area (Vmax):    0.44 cm      PV Vmax:       0.91 m/s AV Area (Vmean):   0.41 cm      PV Peak grad:  3.3 mmHg AV Area (VTI):     0.50 cm AV Vmax:           315.00 cm/s AV Vmean:          226.750 cm/s AV VTI:            0.616 m AV Peak Grad:      39.7 mmHg AV Mean Grad:      23.8 mmHg LVOT Vmax:         48.93 cm/s LVOT Vmean:        32.800 cm/s LVOT VTI:          0.109 m LVOT/AV VTI ratio: 0.18  AORTA Ao Root diam: 2.70 cm Ao Asc diam:  2.90 cm MITRAL VALVE                TRICUSPID VALVE MV Area (PHT): 4.25 cm     TR Peak grad:   33.2 mmHg MV Decel Time: 179 msec     TR Vmax:        288.00 cm/s MV E velocity: 130.33 cm/s                             SHUNTS                             Systemic VTI:  0.11 m  Systemic Diam: 1.90 cm Dorris Carnes MD Electronically signed by Dorris Carnes MD Signature Date/Time: 08/30/2021/3:35:13 PM    Final       Assessment/Plan:  86yo M with  flu which has exacerbated afib with RVR. Also found to have MRSE bacteremia in the setting of his presentation which could contribute to his presentation. Would recommend to treat as true pathogenic-bacteremia  - continue on vancomycin. Recommend to repeat blood cx tomorrow. Recommend to get TTE for now.  - for influenza, continue on oseltamivir. Recommend to treat his wife for prophylaxis(Gave info to his son) - will d/c ceftriaxone and azithromycin since suspect it is just viral pneumonia. Patient is not hypoxic  Afib with RVR =defer to cardiology for management. Continue on cardizem drip.

## 2021-08-30 NOTE — Progress Notes (Signed)
Pharmacy Antibiotic Note  Casey Lee is a 87 y.o. male admitted on 08/28/2021 with sepsis due to pneumonia.  Patient also +Influenza A and started on Tamiflu.  Pharmacy has been consulted for Vancomycin dosing for blood culture (2 of 4) + methicillin resistant staph epidermidis.  Plan: Vancomycin 1500mg  IV x 1 dose followed by 750 mg IV Q 24 hrs. Goal AUC 400-550. Expected AUC: 501.9  SCr used: 1.48 Patient on Tamiflu 30mg  daily x 5 days (CrCl initlally ~ 27 ml/min when therapy started).  SCr has decreased to 1.48 with CrCl now ~ 31 ml/min. Will adjust Tamiflu to 30mg  po BID for duration of 5 day therapy F/u cultures Follow renal function   Weight: 68.2 kg (150 lb 5.7 Casey)  Temp (24hrs), Avg:98 F (36.7 C), Min:97 F (36.1 C), Max:99.6 F (37.6 C)  Recent Labs  Lab 08/28/21 2143 08/29/21 0139 08/29/21 0225  WBC 9.4  --  9.1  CREATININE 1.64*  --  1.48*  LATICACIDVEN 2.3* 2.6* 1.8    Estimated Creatinine Clearance: 31.4 mL/min (A) (by C-G formula based on SCr of 1.48 mg/dL (H)).    Allergies  Allergen Reactions   Fosamax [Alendronate] Other (See Comments)    SEVERE JAW(BONE) PAIN   Clopidogrel     Other reaction(s): Other (See Comments)    Antimicrobials this admission: 11/26 Ceftriaxone >>   11/26 Azithromycin >>   11/27 Tamiflu x 5 days 11/28 Vancomycin >>  Dose adjustments this admission:    Microbiology results: 11/26 BCx: MR staph epi (2/4) 11/27 UCx:    11/27 MRSA PCR: negative  Thank you for allowing pharmacy to be a part of this patient's care.  Everette Rank, PharmD 08/30/2021 12:11 AM

## 2021-08-30 NOTE — Consult Note (Signed)
Cardiology Consultation:   Patient ID: Casey Lee MRN: [id number]; DOB: 05-Jun-1928  Admit date: 08/28/2021 Date of Consult: 08/30/2021  PCP:  [name], MD   [facility] Providers Cardiologist:  None New  D. Kayleen Memos, NP [facility] 2019  Patient Profile:   Casey Lee is a 87 y.o. male with a hx of CVA, CKD III, HLD, temporal arteritis, who is being seen 08/30/2021 for the evaluation of Afib RVR in setting of sepsis from flu A, at the request of Dr Grandville Silos.  History of Present Illness:   Casey Lee was last seen by Ms Kayleen Memos 2019. He was in SR, on BB and Eliquis.   He had COVID and a UTI over the summer, and the combination really dragged him down.  His strength and mental status have not fully recovered since then.  Pt admitted 11/26 with cough, fever, hypotension, PNA, influenza A, MRSA +, rapid Afib. Cards asked to see for the Afib.  Casey Lee son is in the room, but the patient does not recognize him.  He answers questions fairly well but is clearly confused.  His son says the confusion has gotten worse overnight.  He had developed general weakness, cough, and fatigue prior to admission.  He went to an urgent care and tested positive for flu A.  However, over the 24 hours prior to admission he became much weaker and was unable to get out of his chair unassisted.  His symptoms seemed orthostatic in nature.  EMS was called and he was given Tylenol for fever and a normal saline 1 L bolus.  He was in rapid atrial fibrillation on arrival.  Currently, he feels that he is breathing okay on O2.  His sats are about 94%.  He is not coughing.  He is completely unaware of the atrial fibrillation.  He has never had any palpitations.  He has not checked his blood pressure recently and does not know how long his heart rate has been elevated.  He knows his Eliquis dose, and says he has not missed any.  He has been weak, but it is unclear if that was from the atrial  fibrillation or from developing the flu.  Currently, he is resting comfortably.   Past Medical History:  Diagnosis Date   Atrial fibrillation ([place])    Hearing loss    High cholesterol    Temporal arteritis ([facility])    TIA (transient ischemic attack)    UTI (lower urinary tract infection)    Vertigo     Past Surgical History:  Procedure Laterality Date   ARTERY BIOPSY Right 04/30/2018   Procedure: BIOPSY TEMPORAL ARTERY;  Surgeon: Izora Gala, MD;  Location: [place];  Service: ENT;  Laterality: Right;   CATARACT EXTRACTION Right 04/28/2020   I & D EXTREMITY Right 05/12/2018   Procedure: IRRIGATION AND DEBRIDEMENT RIGHT KNEE BURSA;  Surgeon: Marchia Bond, MD;  Location: [place];  Service: Orthopedics;  Laterality: Right;   none       Home Medications:  Prior to Admission medications   Medication Sig Start Date End Date Taking? Authorizing Provider  albuterol (VENTOLIN HFA) 108 (90 Base) MCG/ACT inhaler Inhale 2 puffs into the lungs every 6 (six) hours as needed for wheezing or shortness of breath. 04/04/21  Yes Ghimire, Henreitta Leber, MD  apixaban (ELIQUIS) 2.5 MG TABS tablet Take 1 tablet (2.5 mg total) by mouth 2 (two) times daily. 04/04/21  Yes Ghimire, Henreitta Leber, MD  Baloxavir Chari Manning  MG Dose, (XOFLUZA, 40 MG DOSE,) 1 x 40 MG TBPK Take 40 mg by mouth once.   Yes [provider]  benzonatate (TESSALON PERLES) 100 MG capsule Take 1 capsule (100 mg total) by mouth 3 (three) times daily as needed for cough. 04/04/21 04/04/22 Yes Ghimire, Henreitta Leber, MD  Calcium-Phosphorus-Vitamin D (CALCIUM/D3 ADULT GUMMIES PO) Take 3 tablets by mouth See admin instructions. CHEW 3 gummies by mouth daily   Yes [provider]  Coenzyme Q10 (COQ10 PO) Take 1 capsule by mouth daily with lunch.   Yes [provider]  loratadine (CLARITIN) 10 MG tablet Take 10 mg by mouth daily.   Yes [provider]  metoprolol succinate (TOPROL-XL) 25 MG 24 hr tablet Take 1  tablet (25 mg total) by mouth daily. 05/17/18  Yes Shelly Coss, MD  neomycin-polymyxin b-dexamethasone (MAXITROL) 3.5-10000-0.1 OINT Place 1 application into the left eye 2 (two) times daily. 07/13/21  Yes [provider]  predniSONE (DELTASONE) 5 MG tablet Take 5 mg by mouth daily.   Yes [provider]  rosuvastatin (CRESTOR) 10 MG tablet Take 10 mg by mouth daily. 08/05/21  Yes [provider]  tamsulosin (FLOMAX) 0.4 MG CAPS capsule Take 1 capsule (0.4 mg total) by mouth daily after supper. 04/04/21  Yes Ghimire, Henreitta Leber, MD  cefdinir (OMNICEF) 300 MG capsule Take 1 capsule (300 mg total) by mouth daily. Patient not taking: Reported on 08/28/2021 04/04/21   Jonetta Osgood, MD  predniSONE (DELTASONE) 5 MG tablet Take 6 tablets (30 mg) p.o. daily for 2 days, then, Take 4 tablets (20 mg) p.o. daily for 2 days, then, Take 2 tablets (10 mg) p.o. daily for 2 days, then, Resume your usual dosing of 5 mg p.o. daily. Patient not taking: Reported on 08/28/2021 04/04/21   Jonetta Osgood, MD    Inpatient Medications: Scheduled Meds:  apixaban  2.5 mg Oral BID   Chlorhexidine Gluconate Cloth  6 each Topical Q0600   fluticasone  2 spray Each Nare Daily   hydrocortisone sod succinate (SOLU-CORTEF) inj  25 mg Intravenous Q8H   levalbuterol  0.63 mg Nebulization BID   loratadine  10 mg Oral Daily   mouth rinse  15 mL Mouth Rinse BID   melatonin  5 mg Oral Once   metoprolol succinate  25 mg Oral Daily   oseltamivir  30 mg Oral BID   pantoprazole  40 mg Oral Q0600   rosuvastatin  10 mg Oral Daily   Continuous Infusions:  azithromycin Stopped (08/30/21 0000)   cefTRIAXone (ROCEPHIN)  IV Stopped (08/29/21 2245)   diltiazem (CARDIZEM) infusion 5 mg/hr (08/30/21 0841)   [START ON 08/31/2021] vancomycin     PRN Meds: acetaminophen **OR** acetaminophen  Allergies:    Allergies  Allergen Reactions   Fosamax [Alendronate] Other (See Comments)    SEVERE JAW(BONE) PAIN    Clopidogrel     Other reaction(s): Other (See Comments)    Social History:   Social History   Socioeconomic History   Marital status: Married    Spouse name: Not on file   Number of children: Not on file   Years of education: Not on file   Highest education level: Not on file  Occupational History   Not on file  Tobacco Use   Smoking status: Former    Types: Cigarettes    Quit date: 11/07/1952    Years since quitting: 68.8   Smokeless tobacco: Never  Vaping Use   Vaping Use:  Never used  Substance and Sexual Activity   Alcohol use: No    Alcohol/week: 0.0 standard drinks   Drug use: No   Sexual activity: Not on file  Other Topics Concern   Not on file  Social History Narrative   Lives with wife in split level home.  Has 2 sons.     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     Currently works as a Engineer, site.   Social Determinants of Health   Financial Resource Strain: Not on file  Food Insecurity: Not on file  Transportation Needs: Not on file  Physical Activity: Not on file  Stress: Not on file  Social Connections: Not on file  Intimate Partner Violence: Not on file    Family History:   Family History  Problem Relation Age of Onset   Rheum arthritis Father    Heart failure Father        Deceased, 68   Rheum arthritis Sister    Diabetes Mellitus II Sister    Healthy Son        x2     ROS:  Please see the history of present illness.  All other ROS reviewed and negative.     Physical Exam/Data:   Vitals:   08/30/21 0700 08/30/21 0752 08/30/21 0819 08/30/21 0837  BP: 108/75  (!) 101/57   Pulse:      Resp: 18  18   Temp:    98.2 F (36.8 C)  TempSrc:    Axillary  SpO2: 93% 94% 92%   Weight:        Intake/Output Summary (Last 24 hours) at 08/30/2021 1014 Last data filed at 08/30/2021 0841 Gross per 24 hour  Intake 2052.83 ml  Output 600 ml  Net 1452.83 ml   Last 3 Weights 08/30/2021 08/30/2021 04/02/2021  Weight (lbs) 150 lb 5.7 oz 150 lb 5.7 oz  147 lb 14.9 oz  Weight (kg) 68.2 kg 68.2 kg 67.1 kg     Body mass index is 22.2 kg/m.  General:  Well nourished, well developed, in no acute distress HEENT: normal Neck: JVD 8-9 cm Vascular: No carotid bruits; Distal pulses 2+ bilaterally Cardiac:  normal S1, S2; rapid and irregular rate and rhythm; no murmur  Lungs: Bibasilar rales, no wheezing, rhonchi    Abd: soft, nontender, no hepatomegaly  Ext: no edema Musculoskeletal:  No deformities, BUE and BLE strength weak but equal Skin: warm and dry  Neuro:  CNs 2-12 intact, no focal abnormalities noted Psych:  Normal affect   EKG:  The EKG was personally reviewed and demonstrates:  11/27 ECG is Atrial flutter, RVR, HR 127 Telemetry:  Telemetry was personally reviewed and demonstrates: Atrial fib with PVCs, rate around 100.  Relevant CV Studies:  ECHO: ordered  ECHO: 01/25/2016 - Left ventricle: The cavity size was normal. Wall thickness was    normal. Systolic function was normal. The estimated ejection    fraction was in the range of 55% to 60%. Wall motion was normal;    there were no regional wall motion abnormalities. Doppler    parameters are consistent with abnormal left ventricular    relaxation (grade 1 diastolic dysfunction).   Impressions:   - Normal LV systolic function; grade 1 diastolic dysfunction; trace    MR and TR.   Laboratory Data:  High Sensitivity Troponin:  No results for input(s): TROPONINIHS in the last 720 hours.   Chemistry Recent Labs  Lab 08/28/21 2143 08/29/21 0225 08/30/21 0257  NA 137  137 138  K 3.9 4.2 3.9  CL 109 107 104  CO2 20* 20* 18*  GLUCOSE 200* 184* 155*  BUN 31* 30* 31*  CREATININE 1.64* 1.48* 1.58*  CALCIUM 8.6* 8.6* 8.9  MG  --   --  1.8  GFRNONAA 39* 44* 41*  ANIONGAP 8 10 16*    Recent Labs  Lab 08/28/21 2143 08/29/21 0225  PROT 6.3* 6.2*  ALBUMIN 3.5 3.3*  AST 21 33  ALT 18 25  ALKPHOS 50 53  BILITOT 1.0 0.9   Lipids No results for input(s): CHOL, TRIG,  HDL, LABVLDL, LDLCALC, CHOLHDL in the last 168 hours.  Hematology Recent Labs  Lab 08/28/21 2143 08/29/21 0225 08/30/21 0257  WBC 9.4 9.1 18.8*  RBC 4.46 4.47 4.45  HGB 13.5 13.7 13.4  HCT 41.7 41.9 41.1  MCV 93.5 93.7 92.4  MCH 30.3 30.6 30.1  MCHC 32.4 32.7 32.6  RDW 14.6 14.6 14.4  PLT 145* 144* 151   Thyroid  Recent Labs  Lab 08/30/21 0257  TSH 1.414    BNPNo results for input(s): BNP, PROBNP in the last 168 hours.  DDimer No results for input(s): DDIMER in the last 168 hours. Lab Results  Component Value Date   HGBA1C 7.9 (H) 08/29/2021     Radiology/Studies:  CT Head Wo Contrast  Result Date: 08/28/2021 CLINICAL DATA:  Orthostatic changes and altered mental status. EXAM: CT HEAD WITHOUT CONTRAST TECHNIQUE: Contiguous axial images were obtained from the base of the skull through the vertex without intravenous contrast. COMPARISON:  September 08, 2016 FINDINGS: Brain: There is mild cerebral atrophy with widening of the extra-axial spaces and ventricular dilatation. There are areas of decreased attenuation within the white matter tracts of the supratentorial brain, consistent with microvascular disease changes. Small, bilateral chronic basal ganglia lacunar infarcts are noted. A chronic left occipital lobe infarct is also seen. Vascular: No hyperdense vessel or unexpected calcification. Skull: Normal. Negative for fracture or focal lesion. Sinuses/Orbits: No acute finding. Other: None. IMPRESSION: 1. Generalized cerebral atrophy. 2. No acute intracranial abnormality. Electronically Signed   By: Virgina Norfolk M.D.   On: 08/28/2021 21:58   DG Chest Port 1 View  Result Date: 08/28/2021 CLINICAL DATA:  Questionable sepsis.  Diagnosed with flu EXAM: PORTABLE CHEST 1 VIEW COMPARISON:  Chest x-ray 04/02/2021, CT chest 03/31/2021 FINDINGS: The heart and mediastinal contours are unchanged. Aortic calcification. No focal consolidation. Question retrocardiac airspace opacity. No  pleural effusion. No pneumothorax. No acute osseous abnormality. IMPRESSION: Question retrocardiac airspace opacity. Electronically Signed   By: Iven Finn M.D.   On: 08/28/2021 21:46     Assessment and Plan:   Persistent atrial fibrillation -Unknown duration - He was on Eliquis prior to admission for previous atrial fibrillation, dose reduced for age and decreased renal function -He is completely unaware of the atrial fib - In the setting of acute illness, Cardizem IV at 5 mg/hour is helping - He is also on Toprol-XL 25 mg daily, this is a home med - SBP has been borderline at 101 and occasionally dropping into the 80s, so will not increase the Cardizem or change to p.o. at this time - Because of the steroids he is being given for the flu, heart rate may be difficult to control -Follow-up on echo, if atria are enlarged, this would indicate he is having more atrial fibrillation then we know  2.  Influenza A - He also had blood culture ID panel positive for Staphylococcus epidermidis but Staph  aureus was not detected - He is on Tamiflu, antibiotics, and steroids - Management per IM   Risk Assessment/Risk Scores:        CHA2DS2-VASc Score = 5   This indicates a 7.2% annual risk of stroke. The patient's score is based upon: CHF History: 0 HTN History: 1 Diabetes History: 1 Stroke History: 0 Vascular Disease History: 1 Age Score: 2 Gender Score: 0     For questions or updates, please contact [facility] Please consult [email or web address] for contact info under    Signed, Rosaria Ferries, PA-C  08/30/2021 10:14 AM

## 2021-08-31 DIAGNOSIS — R7881 Bacteremia: Secondary | ICD-10-CM | POA: Diagnosis not present

## 2021-08-31 DIAGNOSIS — E876 Hypokalemia: Secondary | ICD-10-CM

## 2021-08-31 DIAGNOSIS — I10 Essential (primary) hypertension: Secondary | ICD-10-CM | POA: Diagnosis not present

## 2021-08-31 DIAGNOSIS — I480 Paroxysmal atrial fibrillation: Secondary | ICD-10-CM | POA: Diagnosis not present

## 2021-08-31 DIAGNOSIS — J189 Pneumonia, unspecified organism: Secondary | ICD-10-CM | POA: Diagnosis not present

## 2021-08-31 DIAGNOSIS — B9562 Methicillin resistant Staphylococcus aureus infection as the cause of diseases classified elsewhere: Secondary | ICD-10-CM | POA: Diagnosis present

## 2021-08-31 DIAGNOSIS — J101 Influenza due to other identified influenza virus with other respiratory manifestations: Secondary | ICD-10-CM | POA: Diagnosis not present

## 2021-08-31 DIAGNOSIS — A419 Sepsis, unspecified organism: Secondary | ICD-10-CM | POA: Diagnosis not present

## 2021-08-31 LAB — BASIC METABOLIC PANEL
Anion gap: 13 (ref 5–15)
BUN: 36 mg/dL — ABNORMAL HIGH (ref 8–23)
CO2: 18 mmol/L — ABNORMAL LOW (ref 22–32)
Calcium: 8.7 mg/dL — ABNORMAL LOW (ref 8.9–10.3)
Chloride: 106 mmol/L (ref 98–111)
Creatinine, Ser: 1.52 mg/dL — ABNORMAL HIGH (ref 0.61–1.24)
GFR, Estimated: 43 mL/min — ABNORMAL LOW (ref >60)
Glucose, Bld: 173 mg/dL — ABNORMAL HIGH (ref 70–99)
Potassium: 3.3 mmol/L — ABNORMAL LOW (ref 3.5–5.1)
Sodium: 137 mmol/L (ref 135–145)

## 2021-08-31 LAB — CBC WITH DIFFERENTIAL/PLATELET
Abs Immature Granulocytes: 0.09 10*3/uL — ABNORMAL HIGH (ref 0.00–0.07)
Basophils Absolute: 0 10*3/uL (ref 0.0–0.1)
Basophils Relative: 0 %
Eosinophils Absolute: 0.1 10*3/uL (ref 0.0–0.5)
Eosinophils Relative: 0 %
HCT: 40.3 % (ref 39.0–52.0)
Hemoglobin: 13 g/dL (ref 13.0–17.0)
Immature Granulocytes: 1 %
Lymphocytes Relative: 5 %
Lymphs Abs: 0.7 10*3/uL (ref 0.7–4.0)
MCH: 30.4 pg (ref 26.0–34.0)
MCHC: 32.3 g/dL (ref 30.0–36.0)
MCV: 94.2 fL (ref 80.0–100.0)
Monocytes Absolute: 0.9 10*3/uL (ref 0.1–1.0)
Monocytes Relative: 6 %
Neutro Abs: 13.6 10*3/uL — ABNORMAL HIGH (ref 1.7–7.7)
Neutrophils Relative %: 88 %
Platelets: 137 10*3/uL — ABNORMAL LOW (ref 150–400)
RBC: 4.28 MIL/uL (ref 4.22–5.81)
RDW: 14.7 % (ref 11.5–15.5)
WBC: 15.4 10*3/uL — ABNORMAL HIGH (ref 4.0–10.5)
nRBC: 0 % (ref 0.0–0.2)

## 2021-08-31 LAB — CULTURE, BLOOD (ROUTINE X 2)

## 2021-08-31 LAB — MAGNESIUM: Magnesium: 1.8 mg/dL (ref 1.7–2.4)

## 2021-08-31 MED ORDER — POTASSIUM CHLORIDE 20 MEQ PO PACK
PACK | ORAL | Status: AC
  Filled 2021-08-31: qty 2

## 2021-08-31 MED ORDER — SODIUM BICARBONATE 650 MG PO TABS
ORAL_TABLET | ORAL | Status: AC
  Administered 2021-09-01: 650 mg via ORAL
  Filled 2021-08-31: qty 1

## 2021-08-31 MED ORDER — DEXTROSE 50 % IV SOLN
12.5000 g | Freq: Once | INTRAVENOUS | Status: DC
Start: 2021-08-31 — End: 2021-08-31

## 2021-08-31 MED ORDER — LEVALBUTEROL HCL 0.63 MG/3ML IN NEBU: 0.6300 mg | INHALATION_SOLUTION | Freq: Four times a day (QID) | RESPIRATORY_TRACT | Status: DC | PRN

## 2021-08-31 MED ORDER — ORAL CARE MOUTH RINSE
15.0000 mL | Freq: Two times a day (BID) | OROMUCOSAL | Status: DC
  Administered 2021-08-31 – 2021-09-05 (×11): 15 mL via OROMUCOSAL

## 2021-08-31 MED ORDER — QUETIAPINE FUMARATE 25 MG PO TABS
12.5000 mg | ORAL_TABLET | Freq: Every day | ORAL | Status: DC | PRN
  Filled 2021-08-31 (×2): qty 1

## 2021-08-31 MED ORDER — SODIUM CHLORIDE 0.9 % IV SOLN: INTRAVENOUS | Status: DC | PRN

## 2021-08-31 MED ORDER — POTASSIUM CHLORIDE 20 MEQ PO PACK
40.0000 meq | PACK | Freq: Once | ORAL | Status: AC
Start: 2021-08-31 — End: 2021-08-31
  Administered 2021-08-31: 40 meq via ORAL

## 2021-08-31 MED ORDER — SODIUM BICARBONATE 650 MG PO TABS
650.0000 mg | ORAL_TABLET | Freq: Two times a day (BID) | ORAL | Status: AC
  Administered 2021-08-31 – 2021-09-02 (×5): 650 mg via ORAL
  Filled 2021-08-31 (×5): qty 1

## 2021-08-31 MED ORDER — POTASSIUM CHLORIDE CRYS ER 10 MEQ PO TBCR: 40.0000 meq | EXTENDED_RELEASE_TABLET | Freq: Once | ORAL | Status: DC

## 2021-08-31 MED ORDER — HYDROCORTISONE SOD SUC (PF) 100 MG IJ SOLR
25.0000 mg | Freq: Two times a day (BID) | INTRAMUSCULAR | Status: DC
Start: 2021-08-31 — End: 2021-09-02
  Administered 2021-08-31 – 2021-09-02 (×4): 25 mg via INTRAVENOUS
  Filled 2021-08-31 (×3): qty 2

## 2021-08-31 NOTE — Plan of Care (Signed)
Patient had more restful night than what was reported prior. Patient still pulled at devices during sleep and needed redirection. Safety sitter was invaluable for this reason. Bathed this morning, no new skin issues found. Heart rate is 80-100 on Amiodarone 30mg  continuous IV infusion. Maintained oxygen via [place] for most of night while patient sleeping to keep saturations >90%.    Problem: Education: Goal: Knowledge of General Education information will improve Description: Including pain rating scale, medication(s)/side effects and non-pharmacologic comfort measures Outcome: Progressing   Problem: Health Behavior/Discharge Planning: Goal: Ability to manage health-related needs will improve Outcome: Progressing   Problem: Clinical Measurements: Goal: Ability to maintain clinical measurements within normal limits will improve Outcome: Progressing Goal: Will remain free from infection Outcome: Progressing Goal: Diagnostic test results will improve Outcome: Progressing Goal: Respiratory complications will improve Outcome: Progressing Goal: Cardiovascular complication will be avoided Outcome: Progressing   Problem: Activity: Goal: Risk for activity intolerance will decrease Outcome: Progressing   Problem: Nutrition: Goal: Adequate nutrition will be maintained Outcome: Progressing   Problem: Coping: Goal: Level of anxiety will decrease Outcome: Progressing   Problem: Elimination: Goal: Will not experience complications related to bowel motility Outcome: Progressing Goal: Will not experience complications related to urinary retention Outcome: Progressing   Problem: Pain Managment: Goal: General experience of comfort will improve Outcome: Progressing   Problem: Safety: Goal: Ability to remain free from injury will improve Outcome: Progressing   Problem: Skin Integrity: Goal: Risk for impaired skin integrity will decrease Outcome: Progressing   Problem: Safety: Goal:  Non-violent Restraint(s) Outcome: Progressing

## 2021-08-31 NOTE — Progress Notes (Signed)
Progress Note  Patient Name: Casey Lee Date of Encounter: 08/31/2021  [facility] Cardiologist: Buford Dresser, MD   Subjective   Slept better overnight. No pain.   Inpatient Medications    Scheduled Meds:  apixaban  2.5 mg Oral BID   Chlorhexidine Gluconate Cloth  6 each Topical Q0600   fluticasone  2 spray Each Nare Daily   hydrocortisone sod succinate (SOLU-CORTEF) inj  25 mg Intravenous Q12H   levalbuterol  0.63 mg Nebulization BID   loratadine  10 mg Oral Daily   mouth rinse  15 mL Mouth Rinse BID   melatonin  5 mg Oral Once   metoprolol succinate  25 mg Oral Daily   oseltamivir  30 mg Oral BID   pantoprazole  40 mg Oral Q0600   potassium chloride  40 mEq Oral Once   QUEtiapine  12.5 mg Oral QHS   rosuvastatin  10 mg Oral Daily   sodium bicarbonate  650 mg Oral BID   Continuous Infusions:  sodium chloride Stopped (08/31/21 0447)   amiodarone 30 mg/hr (08/31/21 0836)   vancomycin Stopped (08/31/21 0109)   PRN Meds: sodium chloride, acetaminophen **OR** acetaminophen, QUEtiapine   Vital Signs    Vitals:   08/31/21 0845 08/31/21 0900 08/31/21 1000 08/31/21 1022  BP:  118/77 130/69 130/69  Pulse:  95 82 93  Resp:  (!) 35 (!) 27   Temp:      TempSrc:      SpO2: 96% 96% 95%   Weight:      Height:        Intake/Output Summary (Last 24 hours) at 08/31/2021 1046 Last data filed at 08/31/2021 1037 Gross per 24 hour  Intake 939 ml  Output 700 ml  Net 239 ml   Last 3 Weights 08/31/2021 08/30/2021 08/30/2021  Weight (lbs) 152 lb 1.9 oz 150 lb 5.7 oz 150 lb 5.7 oz  Weight (kg) 69 kg 68.2 kg 68.2 kg      Telemetry    Atrial fibrillation, rates improved - Personally Reviewed  ECG    No new since 11/27 - Personally Reviewed  Physical Exam   GEN: Well nourished, well developed in no acute distress. [place] in place HEENT: Normal, moist mucous membranes NECK: No JVD CARDIAC: irregularly irregular rhythm, normal S1 and S2, no rubs or  gallops. 2/6 systolic murmur. VASCULAR: Radial and DP pulses 2+ bilaterally. No carotid bruits RESPIRATORY:  diffusely coarse, +wheezing throughout ABDOMEN: Soft, non-tender, non-distended MUSCULOSKELETAL:  moves all 4 limbs independently SKIN: Warm and dry, no edema NEUROLOGIC:  Alert and oriented x 3. No focal neuro deficits noted. PSYCHIATRIC:  Normal affect    Labs    High Sensitivity Troponin:  No results for input(s): TROPONINIHS in the last 720 hours.   Chemistry Recent Labs  Lab 08/28/21 2143 08/29/21 0225 08/30/21 0257 08/31/21 0239  NA 137 137 138 137  K 3.9 4.2 3.9 3.3*  CL 109 107 104 106  CO2 20* 20* 18* 18*  GLUCOSE 200* 184* 155* 173*  BUN 31* 30* 31* 36*  CREATININE 1.64* 1.48* 1.58* 1.52*  CALCIUM 8.6* 8.6* 8.9 8.7*  MG  --   --  1.8 1.8  PROT 6.3* 6.2*  --   --   ALBUMIN 3.5 3.3*  --   --   AST 21 33  --   --   ALT 18 25  --   --   ALKPHOS 50 53  --   --   BILITOT  1.0 0.9  --   --   GFRNONAA 39* 44* 41* 43*  ANIONGAP 8 10 16* 13    Lipids No results for input(s): CHOL, TRIG, HDL, LABVLDL, LDLCALC, CHOLHDL in the last 168 hours.  Hematology Recent Labs  Lab 08/29/21 0225 08/30/21 0257 08/31/21 0239  WBC 9.1 18.8* 15.4*  RBC 4.47 4.45 4.28  HGB 13.7 13.4 13.0  HCT 41.9 41.1 40.3  MCV 93.7 92.4 94.2  MCH 30.6 30.1 30.4  MCHC 32.7 32.6 32.3  RDW 14.6 14.4 14.7  PLT 144* 151 137*   Thyroid  Recent Labs  Lab 08/30/21 0257  TSH 1.414    BNPNo results for input(s): BNP, PROBNP in the last 168 hours.  DDimer No results for input(s): DDIMER in the last 168 hours.   Radiology    ECHOCARDIOGRAM COMPLETE  Result Date: 08/30/2021    ECHOCARDIOGRAM REPORT   Patient Name:   Casey Lee [facility] Date of Exam: 08/30/2021 Medical Rec #:  [id number]        Height:       69.0 in Accession #:    [id number]       Weight:       150.4 lb Date of Birth:  Mar 03, 1928       BSA:          1.830 m Patient Age:    87 years         BP:           85/60 mmHg Patient  Gender: M                HR:           90 bpm. Exam Location:  Inpatient Procedure: 2D Echo, Cardiac Doppler, Color Doppler and Intracardiac            Opacification Agent Indications:    I48.91* Unspeicified atrial fibrillation  History:        Patient has no prior history of Echocardiogram examinations.                 Risk Factors:Hypertension.  Sonographer:    Glo Herring Referring Phys: [id number] DANIEL V THOMPSON IMPRESSIONS  1. POor acoustic windows limit study. Laterall hypokinesis (mid/distal), basal inferior hypokinesis.. Left ventricular ejection fraction, by estimation, is 45 to 50%. The left ventricle has mildly decreased function. Left ventricular diastolic parameters are indeterminate.  2. Right ventricular systolic function is normal. The right ventricular size is normal.  3. Mitral regurgitation is eccentric directed along posterior wall of left atrium . Mild to moderate mitral valve regurgitation.  4. AV is thickened, calcified with restricted motion Peak and mean gradients through the valve are 42 and 24 mm Hg respectively. Stroke volume index is 17, suggesting this may be low gradient AS. Dimensionless index is 18, consistent with severe AS>. The aortic valve was not well visualized. Aortic valve regurgitation is not visualized.  5. The inferior vena cava is normal in size with <50% respiratory variability, suggesting right atrial pressure of 8 mmHg. FINDINGS  Left Ventricle: POor acoustic windows limit study. Laterall hypokinesis (mid/distal), basal inferior hypokinesis. Left ventricular ejection fraction, by estimation, is 45 to 50%. The left ventricle has mildly decreased function. The left ventricular internal cavity size was normal in size. Left ventricular diastolic parameters are indeterminate. Right Ventricle: The right ventricular size is normal. Right vetricular wall thickness was not assessed. Right ventricular systolic function is normal. Left Atrium: Left atrial size was normal in  size. Right  Atrium: Right atrial size was normal in size. Pericardium: There is no evidence of pericardial effusion. Mitral Valve: Mitral regurgitation is eccentric directed along posterior wall of left atrium. There is mild thickening of the mitral valve leaflet(s). Mild mitral annular calcification. Mild to moderate mitral valve regurgitation. Tricuspid Valve: The tricuspid valve is normal in structure. Tricuspid valve regurgitation is mild. Aortic Valve: AV is thickened, calcified with restricted motion Peak and mean gradients through the valve are 42 and 24 mm Hg respectively. Stroke volume index is 17, suggesting this may be low gradient AS. Dimensionless index is 18, consistent with severe AS>. The aortic valve was not well visualized. Aortic valve regurgitation is not visualized. Aortic valve mean gradient measures 23.8 mmHg. Aortic valve peak gradient measures 39.7 mmHg. Aortic valve area, by VTI measures 0.50 cm. Pulmonic Valve: The pulmonic valve was not well visualized. Pulmonic valve regurgitation is not visualized. No evidence of pulmonic stenosis. Aorta: The aortic root and ascending aorta are structurally normal, with no evidence of dilitation. Venous: The inferior vena cava is normal in size with less than 50% respiratory variability, suggesting right atrial pressure of 8 mmHg. IAS/Shunts: No atrial level shunt detected by color flow Doppler.  LEFT VENTRICLE PLAX 2D LVOT diam:     1.90 cm     Diastology LV SV:         31          LV e' medial:    6.71 cm/s LV SV Index:   17          LV E/e' medial:  19.4 LVOT Area:     2.84 cm    LV e' lateral:   6.89 cm/s                            LV E/e' lateral: 18.9  LV Volumes (MOD) LV vol d, MOD A2C: 64.5 ml LV vol d, MOD A4C: 55.5 ml LV vol s, MOD A2C: 30.0 ml LV vol s, MOD A4C: 34.1 ml LV SV MOD A2C:     34.5 ml LV SV MOD A4C:     55.5 ml LV SV MOD BP:      30.5 ml RIGHT VENTRICLE          IVC RV Basal diam:  3.20 cm  IVC diam: 1.90 cm LEFT ATRIUM              Index        RIGHT ATRIUM           Index LA Vol (A2C):   42.8 ml 23.39 ml/m  RA Area:     16.50 cm LA Vol (A4C):   39.0 ml 21.31 ml/m  RA Volume:   45.50 ml  24.86 ml/m LA Biplane Vol: 41.1 ml 22.46 ml/m  AORTIC VALVE                     PULMONIC VALVE AV Area (Vmax):    0.44 cm      PV Vmax:       0.91 m/s AV Area (Vmean):   0.41 cm      PV Peak grad:  3.3 mmHg AV Area (VTI):     0.50 cm AV Vmax:           315.00 cm/s AV Vmean:          226.750 cm/s AV VTI:  0.616 m AV Peak Grad:      39.7 mmHg AV Mean Grad:      23.8 mmHg LVOT Vmax:         48.93 cm/s LVOT Vmean:        32.800 cm/s LVOT VTI:          0.109 m LVOT/AV VTI ratio: 0.18  AORTA Ao Root diam: 2.70 cm Ao Asc diam:  2.90 cm MITRAL VALVE                TRICUSPID VALVE MV Area (PHT): 4.25 cm     TR Peak grad:   33.2 mmHg MV Decel Time: 179 msec     TR Vmax:        288.00 cm/s MV E velocity: 130.33 cm/s                             SHUNTS                             Systemic VTI:  0.11 m                             Systemic Diam: 1.90 cm Dorris Carnes MD Electronically signed by Dorris Carnes MD Signature Date/Time: 08/30/2021/3:35:13 PM    Final     Cardiac Studies   Echo 08/30/21 1. POor acoustic windows limit study. Laterall hypokinesis (mid/distal), basal inferior hypokinesis.. Left ventricular ejection fraction, by estimation, is 45 to 50%. The left ventricle has mildly decreased function. Left ventricular diastolic parameters are indeterminate.   2. Right ventricular systolic function is normal. The right ventricular size is normal.   3. Mitral regurgitation is eccentric directed along posterior wall of left atrium . Mild to moderate mitral valve regurgitation.   4. AV is thickened, calcified with restricted motion Peak and mean gradients through the valve are 42 and 24 mm Hg respectively. Stroke volume index is 17, suggesting this may be low gradient AS. Dimensionless index is 18, consistent with severe AS. The aortic valve was  not well visualized. Aortic valve regurgitation is not visualized.   5. The inferior vena cava is normal in size with <50% respiratory variability, suggesting right atrial pressure of 8 mmHg.  Patient Profile     87 y.o. male with PMH paroxysmal atrial fibrillation, presenting with influenza A and found to have atrial fibrillation with RVR  Assessment & Plan    Atrial fibrillation, paroxysmal vs persistent Cardiomyopathy, unknown if ischemic or nonischemic. EF-45=50% -he cannot detect whether he is in afib, difficult to determine duration -has been on apixaban, dose reduced for age and renal function -doing better on IV amiodarone, rates have come down, blood pressure improving -continue metoprolol succinate for now -given acute illness, afib with RVR, lack of chest pain will not pursue ischemic evaluation at this time. Recommend medical therapy of cardiomyopathy as tolerated -would recheck echo as an outpatient once rate is stable and patient clinically improved   Possible aortic stenosis -gradient is moderate, but dimensionless index and stroke volume index suggest possibly severe -murmur does not sound severe -would recheck limited echo once rate slower/respiratory status improved to interrogate aortic valve, check EF   Influenza A MRSE bacteremia -treatment per primary team and infectious disease  For questions or updates, please contact [facility] Please consult [email or web address] for contact info under  Signed, Buford Dresser, MD  08/31/2021, 10:46 AM

## 2021-08-31 NOTE — Evaluation (Addendum)
Occupational Therapy Evaluation Patient Details Name: Casey Lee MRN: [id number] DOB: [birth year] Today's Date: 08/31/2021   History of Present Illness 87 year old male presented 08/28/21 with cough,  fever, chills, Flu A.  COVID 19 PCR negative. In ED patient noted to be hypotensive systolic blood pressures  A. fib with RVR, chest x-ray consistent with retrosternal densities. PMH including temporal arteritis on prednisone, CKD stage IIIa, hyperlipidemia, A. fib.   Clinical Impression   PTA, pt was living with his wife and was independent with ADLs and IADLs; has someone performing driving. Pt currently requiring Min A for ADLs and functional transfers. Pt presenting with decreased activity tolerance as seen by fatigue, elevate RR, and decreased SpO2 on 2L. Pt would benefit from further acute OT to facilitate safe dc. Pending support from family, recommend dc to home with [place] for further OT to optimize safety, independence with ADLs, and return to PLOF.   SpO2 between 95-88% on 2L O2. One episode of SpO2 dropping to 83%; however pleth was poor. HR elevating to 131 max. RR 30-40s.    Recommendations for follow up therapy are one component of a multi-disciplinary discharge planning process, led by the attending physician.  Recommendations may be updated based on patient status, additional functional criteria and insurance authorization.   Follow Up Recommendations  Home health OT    Assistance Recommended at Discharge Frequent or constant Supervision/Assistance  Functional Status Assessment  Patient has had a recent decline in their functional status and demonstrates the ability to make significant improvements in function in a reasonable and predictable amount of time.  Equipment Recommendations  Tub/shower seat    Recommendations for Other Services PT consult     Precautions / Restrictions Precautions Precautions: Fall Precaution Comments: Watch SpO2 and RR      Mobility Bed  Mobility Overal bed mobility: Needs Assistance Bed Mobility: Supine to Sit     Supine to sit: Min guard     General bed mobility comments: extra time, no assistance    Transfers Overall transfer level: Needs assistance Equipment used: Rolling walker (2 wheels) Transfers: Sit to/from Stand;Bed to chair/wheelchair/BSC Sit to Stand: Min assist     Step pivot transfers: Min assist     General transfer comment: initially posterior bias when standing, required min assistnace for support      Balance Overall balance assessment: Needs assistance Sitting-balance support: Bilateral upper extremity supported;Feet supported Sitting balance-Leahy Scale: Good     Standing balance support: During functional activity;Bilateral upper extremity supported;Reliant on assistive device for balance Standing balance-Leahy Scale: Poor                             ADL either performed or assessed with clinical judgement   ADL Overall ADL's : Needs assistance/impaired Eating/Feeding: Set up;Supervision/ safety;Sitting   Grooming: Supervision/safety;Set up;Sitting   Upper Body Bathing: Minimal assistance;Sitting   Lower Body Bathing: Minimal assistance;Sit to/from stand   Upper Body Dressing : Minimal assistance;Sitting   Lower Body Dressing: Minimal assistance;Sit to/from stand   Toilet Transfer: Minimal assistance;Stand-pivot;Rolling walker (2 wheels) (simulated to recliner)           Functional mobility during ADLs: Minimal assistance;Rolling walker (2 wheels);+2 for safety/equipment General ADL Comments: Pt presenting with decreased acitvity tolerance as seen by fatigue, elevated RR, and drops in SpO2. Despite fatigue, pt very motivated.     Vision Baseline Vision/History: 1 Wears glasses (readers) Patient Visual Report: No change from baseline  Perception     Praxis      Pertinent Vitals/Pain Pain Assessment: No/denies pain     Hand Dominance Right    Extremity/Trunk Assessment Upper Extremity Assessment Upper Extremity Assessment: Generalized weakness   Lower Extremity Assessment Lower Extremity Assessment: Defer to PT evaluation   Cervical / Trunk Assessment Cervical / Trunk Assessment: Normal   Communication Communication Communication: No difficulties   Cognition Arousal/Alertness: Awake/alert Behavior During Therapy: WFL for tasks assessed/performed Overall Cognitive Status: Within Functional Limits for tasks assessed                                 General Comments: Slightly slow processing but also talking causes elevated RR. Faituges quickly.     General Comments  SpO2 between 95-88% on 2L O2. One episode of SpO2 dropping to 83%; however pleth was poor. HR elevating to 131 max. RR 30-40s.    Exercises     Shoulder Instructions      Home Living Family/patient expects to be discharged to:: Private residence Living Arrangements: Spouse/significant other Available Help at Discharge: Available PRN/intermittently Type of Home: House Home Access: Stairs to enter [employer] of Steps: 2 STE no rails   Home Layout: Able to live on main level with bedroom/bathroom Alternate Level Stairs-Number of Steps: 11 down to office/den and 3 to main house; unsure of B/B level Alternate Level Stairs-Rails: Right Bathroom Shower/Tub: Occupational psychologist: Standard     Home Equipment: None   Additional Comments: lives in split level home. wife on HD 3 x's /wk      Prior Functioning/Environment Prior Level of Function : Independent/Modified Independent             Mobility Comments: Does not use DME ADLs Comments: Reports he perform ADLs and IADLs. Has a driver for wife        OT Problem List: Decreased strength;Decreased range of motion;Decreased activity tolerance;Impaired balance (sitting and/or standing);Decreased knowledge of use of DME or AE;Decreased knowledge of  precautions;Cardiopulmonary status limiting activity      OT Treatment/Interventions: Self-care/ADL training;Therapeutic exercise;Energy conservation;DME and/or AE instruction;Therapeutic activities;Patient/family education    OT Goals(Current goals can be found in the care plan section) Acute Rehab OT Goals Patient Stated Goal: Go home OT Goal Formulation: With patient Time For Goal Achievement: 09/14/21 Potential to Achieve Goals: Good  OT Frequency: Min 2X/week   Barriers to D/C:            Co-evaluation PT/OT/SLP Co-Evaluation/Treatment: Yes Reason for Co-Treatment: For patient/therapist safety;To address functional/ADL transfers PT goals addressed during session: Mobility/safety with mobility OT goals addressed during session: ADL's and self-care      AM-PAC OT "6 Clicks" Daily Activity     Outcome Measure Help from another person eating meals?: None Help from another person taking care of personal grooming?: A Little Help from another person toileting, which includes using toliet, bedpan, or urinal?: A Little Help from another person bathing (including washing, rinsing, drying)?: A Little Help from another person to put on and taking off regular upper body clothing?: A Little Help from another person to put on and taking off regular lower body clothing?: A Little 6 Click Score: 19   End of Session Equipment Utilized During Treatment: Rolling walker (2 wheels);Oxygen Nurse Communication: Mobility status  Activity Tolerance: Patient tolerated treatment well;Patient limited by fatigue Patient left: in chair;with call bell/phone within reach;with chair alarm set  OT Visit Diagnosis: Unsteadiness on feet (R26.81);Other abnormalities of gait and mobility (R26.89);Muscle weakness (generalized) (M62.81)                Time: 1410-1435 OT Time Calculation (min): 25 min Charges:  OT General Charges $OT Visit: 1 Visit OT Evaluation $OT Eval Moderate Complexity: [place], OTR/L Acute Rehab Pager: [phone] Office: [place] 08/31/2021, 4:32 PM

## 2021-08-31 NOTE — Progress Notes (Signed)
[facility] for Infectious Disease    Date of Admission:  08/28/2021   Total days of antibiotics 4/day 3 vanco   ID: Casey Lee is a 87 y.o. male with  influenza pneumonia, with secondary MRSE bacteremia and afib with RVR Principal Problem:   Sepsis ([place]) Active Problems:   Essential hypertension   HLD (hyperlipidemia)   Temporal arteritis (HCC)   PAF (paroxysmal atrial fibrillation) (HCC)   CAP (community acquired pneumonia)   Influenza A   Generalized weakness   Bacteremia due to Gram-positive bacteria   MRSE bacteremia   Hypokalemia    Subjective: Remains afebrile, BP improved since on iv amio, rate now in 100-110s. On 1L , now.TTE did not show vegetations but had poor windows. WBC trending downward  Medications:   apixaban  2.5 mg Oral BID   Chlorhexidine Gluconate Cloth  6 each Topical Q0600   fluticasone  2 spray Each Nare Daily   hydrocortisone sod succinate (SOLU-CORTEF) inj  25 mg Intravenous Q12H   levalbuterol  0.63 mg Nebulization BID   loratadine  10 mg Oral Daily   mouth rinse  15 mL Mouth Rinse BID   melatonin  5 mg Oral Once   metoprolol succinate  25 mg Oral Daily   oseltamivir  30 mg Oral BID   pantoprazole  40 mg Oral Q0600   rosuvastatin  10 mg Oral Daily   sodium bicarbonate  650 mg Oral BID    Objective: Vital signs in last 24 hours: Temp:  [97.5 F (36.4 C)-98.3 F (36.8 C)] 97.9 F (36.6 C) (11/29 1200) Pulse Rate:  [62-148] 87 (11/29 1511) Resp:  [20-37] 31 (11/29 1511) BP: (94-148)/(57-97) 123/67 (11/29 1511) SpO2:  [89 %-100 %] 96 % (11/29 1511) Weight:  [68 kg] 69 kg (11/29 0500) Physical Exam  Constitutional: He is oriented to person, place, and time. He appears well-developed and well-nourished. No distress.  HENT:  Mouth/Throat: Oropharynx is clear and moist. No oropharyngeal exudate.  Cardiovascular: Normal rate, regular rhythm and normal heart sounds. Soft 2/6 SEM. Pulmonary/Chest: tachypnea. Effort normal and  breath sounds normal. No respiratory distress. Mild expiratory wheeze Abdominal: Soft. Bowel sounds are normal. He exhibits no distension. There is no tenderness.  Lymphadenopathy:  He has no cervical adenopathy.  Neurological: He is alert and oriented to person, place, and time.  Skin: Skin is warm and dry. No rash noted. No erythema.  Psychiatric: He has a normal mood and affect. His behavior is normal.    Lab Results Recent Labs    08/30/21 0257 08/31/21 0239  WBC 18.8* 15.4*  HGB 13.4 13.0  HCT 41.1 40.3  NA 138 137  K 3.9 3.3*  CL 104 106  CO2 18* 18*  BUN 31* 36*  CREATININE 1.58* 1.52*   Liver Panel Recent Labs    08/28/21 2143 08/29/21 0225  PROT 6.3* 6.2*  ALBUMIN 3.5 3.3*  AST 21 33  ALT 18 25  ALKPHOS 50 53  BILITOT 1.0 0.9    Microbiology: 11/26 blood cx 3/4+ bottles MRSE Studies/Results: ECHOCARDIOGRAM COMPLETE  Result Date: 08/30/2021    ECHOCARDIOGRAM REPORT   Patient Name:   Casey Lee Date of Exam: 08/30/2021 Medical Rec #:  [id number]        Height:       69.0 in Accession #:    [id number]       Weight:       150.4 lb Date of Birth:  03/20/1928  BSA:          1.830 m Patient Age:    26 years         BP:           85/60 mmHg Patient Gender: M                HR:           90 bpm. Exam Location:  Inpatient Procedure: 2D Echo, Cardiac Doppler, Color Doppler and Intracardiac            Opacification Agent Indications:    I48.91* Unspeicified atrial fibrillation  History:        Patient has no prior history of Echocardiogram examinations.                 Risk Factors:Hypertension.  Sonographer:    Glo Herring Referring Phys: [id number] DANIEL V THOMPSON IMPRESSIONS  1. POor acoustic windows limit study. Laterall hypokinesis (mid/distal), basal inferior hypokinesis.. Left ventricular ejection fraction, by estimation, is 45 to 50%. The left ventricle has mildly decreased function. Left ventricular diastolic parameters are indeterminate.  2. Right  ventricular systolic function is normal. The right ventricular size is normal.  3. Mitral regurgitation is eccentric directed along posterior wall of left atrium . Mild to moderate mitral valve regurgitation.  4. AV is thickened, calcified with restricted motion Peak and mean gradients through the valve are 42 and 24 mm Hg respectively. Stroke volume index is 17, suggesting this may be low gradient AS. Dimensionless index is 18, consistent with severe AS>. The aortic valve was not well visualized. Aortic valve regurgitation is not visualized.  5. The inferior vena cava is normal in size with <50% respiratory variability, suggesting right atrial pressure of 8 mmHg. FINDINGS  Left Ventricle: POor acoustic windows limit study. Laterall hypokinesis (mid/distal), basal inferior hypokinesis. Left ventricular ejection fraction, by estimation, is 45 to 50%. The left ventricle has mildly decreased function. The left ventricular internal cavity size was normal in size. Left ventricular diastolic parameters are indeterminate. Right Ventricle: The right ventricular size is normal. Right vetricular wall thickness was not assessed. Right ventricular systolic function is normal. Left Atrium: Left atrial size was normal in size. Right Atrium: Right atrial size was normal in size. Pericardium: There is no evidence of pericardial effusion. Mitral Valve: Mitral regurgitation is eccentric directed along posterior wall of left atrium. There is mild thickening of the mitral valve leaflet(s). Mild mitral annular calcification. Mild to moderate mitral valve regurgitation. Tricuspid Valve: The tricuspid valve is normal in structure. Tricuspid valve regurgitation is mild. Aortic Valve: AV is thickened, calcified with restricted motion Peak and mean gradients through the valve are 42 and 24 mm Hg respectively. Stroke volume index is 17, suggesting this may be low gradient AS. Dimensionless index is 18, consistent with severe AS>. The aortic  valve was not well visualized. Aortic valve regurgitation is not visualized. Aortic valve mean gradient measures 23.8 mmHg. Aortic valve peak gradient measures 39.7 mmHg. Aortic valve area, by VTI measures 0.50 cm. Pulmonic Valve: The pulmonic valve was not well visualized. Pulmonic valve regurgitation is not visualized. No evidence of pulmonic stenosis. Aorta: The aortic root and ascending aorta are structurally normal, with no evidence of dilitation. Venous: The inferior vena cava is normal in size with less than 50% respiratory variability, suggesting right atrial pressure of 8 mmHg. IAS/Shunts: No atrial level shunt detected by color flow Doppler.  LEFT VENTRICLE PLAX 2D LVOT diam:  1.90 cm     Diastology LV SV:         31          LV e' medial:    6.71 cm/s LV SV Index:   17          LV E/e' medial:  19.4 LVOT Area:     2.84 cm    LV e' lateral:   6.89 cm/s                            LV E/e' lateral: 18.9  LV Volumes (MOD) LV vol d, MOD A2C: 64.5 ml LV vol d, MOD A4C: 55.5 ml LV vol s, MOD A2C: 30.0 ml LV vol s, MOD A4C: 34.1 ml LV SV MOD A2C:     34.5 ml LV SV MOD A4C:     55.5 ml LV SV MOD BP:      30.5 ml RIGHT VENTRICLE          IVC RV Basal diam:  3.20 cm  IVC diam: 1.90 cm LEFT ATRIUM             Index        RIGHT ATRIUM           Index LA Vol (A2C):   42.8 ml 23.39 ml/m  RA Area:     16.50 cm LA Vol (A4C):   39.0 ml 21.31 ml/m  RA Volume:   45.50 ml  24.86 ml/m LA Biplane Vol: 41.1 ml 22.46 ml/m  AORTIC VALVE                     PULMONIC VALVE AV Area (Vmax):    0.44 cm      PV Vmax:       0.91 m/s AV Area (Vmean):   0.41 cm      PV Peak grad:  3.3 mmHg AV Area (VTI):     0.50 cm AV Vmax:           315.00 cm/s AV Vmean:          226.750 cm/s AV VTI:            0.616 m AV Peak Grad:      39.7 mmHg AV Mean Grad:      23.8 mmHg LVOT Vmax:         48.93 cm/s LVOT Vmean:        32.800 cm/s LVOT VTI:          0.109 m LVOT/AV VTI ratio: 0.18  AORTA Ao Root diam: 2.70 cm Ao Asc diam:  2.90 cm  MITRAL VALVE                TRICUSPID VALVE MV Area (PHT): 4.25 cm     TR Peak grad:   33.2 mmHg MV Decel Time: 179 msec     TR Vmax:        288.00 cm/s MV E velocity: 130.33 cm/s                             SHUNTS                             Systemic VTI:  0.11 m  Systemic Diam: 1.90 cm Dorris Carnes MD Electronically signed by Dorris Carnes MD Signature Date/Time: 08/30/2021/3:35:13 PM    Final      Assessment/Plan: Influenza pneumonia = continue on oseltamavir, 5 day treatment course  Afib with RVR = continue on cards management with amiodarone. Also found to have mod AS on TTE  MRSE bacteremia = being treated with vancomycin. Had 3/4 bottles positive initially. Would continue with iv vancomycin while he is hospitalized and repeat blood cx to ensure he is clearing bacteremia. Length of therapy to be determined based upon repeat blood cx.  Shortness of breath/wheezing = consider repeat cxr tomorrow if symptoms continue  [facility] for Infectious Diseases Pager: [phone]  08/31/2021, 3:44 PM

## 2021-08-31 NOTE — Evaluation (Addendum)
Physical Therapy Evaluation Patient Details Name: Casey Lee MRN: [id number] DOB: 1928/10/02 Today's Date: 08/31/2021  History of Present Illness  Patient pleasant 87 year old male with history of temporal arteritis on prednisone, CKD stage IIIa, hyperlipidemia, A. fib presented 08/28/21 with cough,  fever, chills, Flu A.  COVID 19 PCR negative, mild confusion, generalized weakness, In ED patient noted to be hypotensive systolic blood pressures  A. fib with RVR, chest x-ray consistent with retrosternal densities  Clinical Impression  The patient aroused readily  and able to  mobilize self to sitting. Mild posterior bias when standing. Patient able to to sep to recliner with Rw and min assistance.  Patient on 2 L [place], SPO2 95%  at rest., HR 92. Spo2 dropped to83%, HR max 131 with mobility, RR up to 41.Patient with audible wheeze,  encouraged pursed lip breaths.   Patient resides with wife, independent PTA. Recently has  a "driver" for wife to go to HD so has not driven recently.  Unsure if family available besides wife. Pt admitted with above diagnosis.  Pt currently with functional limitations due to the deficits listed below (see PT Problem List). Pt will benefit from skilled PT to increase their independence and safety with mobility to allow discharge to the venue listed below.        Recommendations for follow up therapy are one component of a multi-disciplinary discharge planning process, led by the attending physician.  Recommendations may be updated based on patient status, additional functional criteria and insurance authorization.  Follow Up Recommendations Home health PT if has 24/7 assistance    Assistance Recommended at Discharge Intermittent Supervision/Assistance  Functional Status Assessment Patient has had a recent decline in their functional status and demonstrates the ability to make significant improvements in function in a reasonable and predictable amount of time.   Equipment Recommendations  Rolling walker (2 wheels)    Recommendations for Other Services       Precautions / Restrictions Precautions Precautions: Fall Precaution Comments: monitor sats      Mobility  Bed Mobility Overal bed mobility: Needs Assistance Bed Mobility: Supine to Sit     Supine to sit: Min guard     General bed mobility comments: extra time, no assistance    Transfers Overall transfer level: Needs assistance Equipment used: Rolling walker (2 wheels) Transfers: Sit to/from Stand;Bed to chair/wheelchair/BSC Sit to Stand: Min assist   Step pivot transfers: Min assist       General transfer comment: initially posterior bias when standing, required min assistnace for support    Ambulation/Gait                  Stairs            Wheelchair Mobility    Modified Rankin (Stroke Patients Only)       Balance Overall balance assessment: Needs assistance Sitting-balance support: Bilateral upper extremity supported;Feet supported Sitting balance-Leahy Scale: Good     Standing balance support: During functional activity;Bilateral upper extremity supported;Reliant on assistive device for balance Standing balance-Leahy Scale: Poor                               Pertinent Vitals/Pain Pain Assessment: No/denies pain    Home Living Family/patient expects to be discharged to:: Private residence Living Arrangements: Spouse/significant other Available Help at Discharge: Available PRN/intermittently Type of Home: House Home Access: Stairs to enter   [employer] of Steps: 2 STE  no rails Alternate Level Stairs-Number of Steps: 11 down to office/den and 3 to main house; unsure of B/B level Home Layout: Able to live on main level with bedroom/bathroom Home Equipment: None Additional Comments: lives in split level home. wife on HD 3 x's /wk    Prior Function Prior Level of Function : Independent/Modified Independent              Mobility Comments: has a DRiver for wife ADLs Comments: independent     Hand Dominance        Extremity/Trunk Assessment        Lower Extremity Assessment Lower Extremity Assessment: Generalized weakness    Cervical / Trunk Assessment Cervical / Trunk Assessment: Normal  Communication      Cognition Arousal/Alertness: Awake/alert Behavior During Therapy: WFL for tasks assessed/performed Overall Cognitive Status: Within Functional Limits for tasks assessed                                          General Comments      Exercises     Assessment/Plan    PT Assessment Patient needs continued PT services  PT Problem List Decreased strength;Decreased balance;Decreased knowledge of precautions;Decreased mobility;Decreased knowledge of use of DME;Cardiopulmonary status limiting activity;Decreased activity tolerance;Decreased safety awareness       PT Treatment Interventions DME instruction;Therapeutic activities;Gait training;Therapeutic exercise;Patient/family education;Functional mobility training    PT Goals (Current goals can be found in the Care Plan section)  Acute Rehab PT Goals Patient Stated Goal: to go home PT Goal Formulation: With patient Time For Goal Achievement: 09/14/21 Potential to Achieve Goals: Good    Frequency Min 2X/week   Barriers to discharge Decreased caregiver support      Co-evaluation PT/OT/SLP Co-Evaluation/Treatment: Yes Reason for Co-Treatment: For patient/therapist safety PT goals addressed during session: Mobility/safety with mobility OT goals addressed during session: ADL's and self-care       AM-PAC PT "6 Clicks" Mobility  Outcome Measure Help needed turning from your back to your side while in a flat bed without using bedrails?: A Little Help needed moving from lying on your back to sitting on the side of a flat bed without using bedrails?: A Little Help needed moving to and from a bed to a  chair (including a wheelchair)?: A Little Help needed standing up from a chair using your arms (e.g., wheelchair or bedside chair)?: A Little Help needed to walk in hospital room?: Total Help needed climbing 3-5 steps with a railing? : Total 6 Click Score: 14    End of Session Equipment Utilized During Treatment: Gait belt;Oxygen Activity Tolerance: Treatment limited secondary to medical complications (Comment) (SOB) Patient left: in chair;with call bell/phone within reach;with chair alarm set Nurse Communication: Mobility status PT Visit Diagnosis: Unsteadiness on feet (R26.81);Difficulty in walking, not elsewhere classified (R26.2)    Time: 7915-0569 PT Time Calculation (min) (ACUTE ONLY): 28 min   Charges:   PT Evaluation $PT Eval Low Complexity: [facility] PT Acute Rehabilitation Services Pager [phone] Office [phone]   Claretha Cooper 08/31/2021, 4:00 PM

## 2021-08-31 NOTE — Progress Notes (Addendum)
PROGRESS NOTE    Casey Lee  DJS:[id number] DOB: [birth year] DOA: 08/28/2021 PCP: [name], MD   Chief Complaint  Patient presents with   Fever   Weakness    Brief Narrative:  Patient pleasant 87 year old male with history of temporal arteritis on prednisone, CKD stage IIIa, hyperlipidemia, A. fib presented with cough, subjective fevers of fever, chills, x3 days.  Patient worsening saw PCP prescribed baloxavir for influenza A.  COVID 19 PCR negative.  Due to mild confusion, generalized weakness patient presented to the ED.  On presentation to the ED patient noted to be hypotensive systolic blood pressures in the 80s, temp of 100.4, A. fib with RVR, chest x-ray consistent with retrosternal densities.  Pancultured.  Placed empirically on IV antibiotics for pneumonia and Tamiflu for influenza A.  Head CT unremarkable.  Repeat COVID test was negative..  Influenza a test positive.   Assessment & Plan:   Principal Problem:   Sepsis ([place]) Active Problems:   CAP (community acquired pneumonia)   Influenza A   MRSE bacteremia   Essential hypertension   HLD (hyperlipidemia)   Temporal arteritis (HCC)   PAF (paroxysmal atrial fibrillation) (HCC)   Generalized weakness   Bacteremia due to Gram-positive bacteria   Hypokalemia  1 sepsis secondary to pneumonia/MRSE bacteremia/influenza A -Patient presented meeting criteria for sepsis with hypotension, fever, tachycardia, chest x-ray consistent with a pneumonia.  Influenza A PCR positive.  COVID-19 PCR negative. -Placed on stress dose steroids with some improvement with blood pressure. -Slowly improving clinically. -Currently afebrile. -Increasing leukocytosis likely due to steroids which are trending back down with steroid taper and treatment of bacteremia. -Blood cultures with staph epidermis.. -2D echo negative for vegetations -Urinalysis nitrite negative, leukocytes negative. -Patient seen in consultation by ID who recommend  repeating blood cultures today, continuation of treatment for influenza, feel patient's pneumonia is likely viral and as such azithromycin and Rocephin were discontinued.   -Continue Tamiflu, IV vancomycin, Xopenex nebs, Flonase, Claritin, PPI.  -ID following and appreciate input and recommendations.  2.  A. fib with RVR/cardiomyopathy -Patient noted to have heart rates which are sustaining in the 130s to the 150s the evening of 08/29/2021 and patient started on a Cardizem drip.    -Elevated heart rate likely secondary to acute infection. -Blood pressure borderline.   -TSH within normal limits. -2D echo done with lateral hypokinesis, basal inferior hypokinesis, EF 45 to 50%, mitral regurgitation. -Due to slightly depressed EF and borderline blood pressure Cardizem drip was discontinued and patient placed on amiodarone drip per cardiology. -Continue home regimen Toprol-XL.   -Eliquis for anticoagulation.   -Cardiology consulted and following and appreciate their input and recommendations.   3.  Influenza A -Continue Tamiflu, Claritin, Flonase, PPI, Xopenex nebs  4.  History of temporal arteritis -Noted to be on prednisolone 5 mg daily. -Due to presentation with sepsis, hypotension, patient placed on stress dose steroids' -Decrease IV Solu-Cortef to 25 mg every 12 hours.  -Prednisolone discontinued.    5.  Hyperlipidemia -Continue statin  6.  Acute metabolic encephalopathy -Felt secondary to sepsis, pneumonia, influenza A. -Patient also noted to have been up all night the night before. -Patient with some agitation yesterday and placed on Seroquel which also may be contributing to patient's drowsiness this morning -Continue Tamiflu, antibiotics, hydration.   -Change Seroquel to as needed -Supportive care.   7.  Chronic thrombocytopenia -Platelet count at 137.  Follow.  8.  Chronic kidney disease stage IIIa -Stable.  Follow.  9.  Hyperglycemia -Likely  steroid-induced. -Hemoglobin A1c 6.3 (01/25/2016) -Glucose level at 173 on labs this morning. -Steroids being tapered down.  10.  Hypokalemia -Replete.   DVT prophylaxis: Eliquis Code Status: Full Family Communication: Updated patient, son at bedside. Disposition:   Status is: Inpatient  Remains inpatient appropriate because: Severity of illness       Consultants:  Cardiology: Dr. Harrell Gave 08/30/2021 ID: Dr. [place] Flattery 08/30/2021  Procedures:  CT at 08/28/2021 Chest x-ray 08/28/2021 2D echo 08/30/2021  Antimicrobials:  IV azithromycin 08/28/2021>>>>> 08/30/2021 IV Rocephin 08/28/2021>>>>> 08/30/2021 IV vancomycin 08/30/2021>>>>   Subjective: Patient drowsy.  Arousable but drifts back off to sleep.  Patient did receive some Seroquel overnight.  Son at bedside.  Heart rate improved in the 90s to low 100s on amiodarone drip.  Patient noted to be agitated yesterday evening and did receive Seroquel.    Objective: Vitals:   08/31/21 0845 08/31/21 0900 08/31/21 1000 08/31/21 1022  BP:  118/77 130/69 130/69  Pulse:  95 82 93  Resp:  (!) 35 (!) 27   Temp:      TempSrc:      SpO2: 96% 96% 95%   Weight:      Height:        Intake/Output Summary (Last 24 hours) at 08/31/2021 1058 Last data filed at 08/31/2021 1037 Gross per 24 hour  Intake 939 ml  Output 700 ml  Net 239 ml   Filed Weights   08/30/21 0000 08/30/21 0540 08/31/21 0500  Weight: 68.2 kg 68.2 kg 69 kg    Examination:  General exam: Drowsy. Respiratory system: Some scattered coarse breath sounds anterior lung fields.  Some upper airway noise, some wheezing.  Fair air movement.  Cardiovascular system: Irregularly irregular.  No JVD.  No murmurs rubs or gallops.  No lower extremity edema.   Gastrointestinal system: Abdomen is soft, nontender, nondistended, positive bowel sounds.  No rebound.  No guarding. Central nervous system: Drowsy.  Moving extremities spontaneously.  No focal neurological  deficits.  Extremities: Symmetric 5 x 5 power. Skin: No rashes, lesions or ulcers Psychiatry: Judgement and insight unable to assess as patient drowsy.      Data Reviewed: I have personally reviewed following labs and imaging studies  CBC: Recent Labs  Lab 08/28/21 2143 08/29/21 0225 08/30/21 0257 08/31/21 0239  WBC 9.4 9.1 18.8* 15.4*  NEUTROABS 6.8 7.7 16.7* 13.6*  HGB 13.5 13.7 13.4 13.0  HCT 41.7 41.9 41.1 40.3  MCV 93.5 93.7 92.4 94.2  PLT 145* 144* 151 137*    Basic Metabolic Panel: Recent Labs  Lab 08/28/21 2143 08/29/21 0225 08/30/21 0257 08/31/21 0239  NA 137 137 138 137  K 3.9 4.2 3.9 3.3*  CL 109 107 104 106  CO2 20* 20* 18* 18*  GLUCOSE 200* 184* 155* 173*  BUN 31* 30* 31* 36*  CREATININE 1.64* 1.48* 1.58* 1.52*  CALCIUM 8.6* 8.6* 8.9 8.7*  MG  --   --  1.8 1.8    GFR: Estimated Creatinine Clearance: 30.9 mL/min (A) (by C-G formula based on SCr of 1.52 mg/dL (H)).  Liver Function Tests: Recent Labs  Lab 08/28/21 2143 08/29/21 0225  AST 21 33  ALT 18 25  ALKPHOS 50 53  BILITOT 1.0 0.9  PROT 6.3* 6.2*  ALBUMIN 3.5 3.3*    CBG: No results for input(s): GLUCAP in the last 168 hours.   Recent Results (from the past 240 hour(s))  Resp Panel by RT-PCR (Flu A&B, Covid) Nasopharyngeal Swab  Status: Abnormal   Collection Time: 08/28/21  9:32 PM   Specimen: Nasopharyngeal Swab; Nasopharyngeal(NP) swabs in vial transport medium  Result Value Ref Range Status   SARS Coronavirus 2 by RT PCR NEGATIVE NEGATIVE Final    Comment: (NOTE) SARS-CoV-2 target nucleic acids are NOT DETECTED.  The SARS-CoV-2 RNA is generally detectable in upper respiratory specimens during the acute phase of infection. The lowest concentration of SARS-CoV-2 viral copies this assay can detect is 138 copies/mL. A negative result does not preclude SARS-Cov-2 infection and should not be used as the sole basis for treatment or other patient management decisions. A negative  result may occur with  improper specimen collection/handling, submission of specimen other than nasopharyngeal swab, presence of viral mutation(s) within the areas targeted by this assay, and inadequate number of viral copies(<138 copies/mL). A negative result must be combined with clinical observations, patient history, and epidemiological information. The expected result is Negative.  Fact Sheet for Patients:  EntrepreneurPulse.com.au  Fact Sheet for Healthcare Providers:  IncredibleEmployment.be  This test is no t yet approved or cleared by the [place] FDA and  has been authorized for detection and/or diagnosis of SARS-CoV-2 by FDA under an Emergency Use Authorization (EUA). This EUA will remain  in effect (meaning this test can be used) for the duration of the COVID-19 declaration under Section 564(b)(1) of the Act, 21 U.S.C.section 360bbb-3(b)(1), unless the authorization is terminated  or revoked sooner.       Influenza A by PCR POSITIVE (A) NEGATIVE Final   Influenza B by PCR NEGATIVE NEGATIVE Final    Comment: (NOTE) The Xpert Xpress SARS-CoV-2/FLU/RSV plus assay is intended as an aid in the diagnosis of influenza from Nasopharyngeal swab specimens and should not be used as a sole basis for treatment. Nasal washings and aspirates are unacceptable for Xpert Xpress SARS-CoV-2/FLU/RSV testing.  Fact Sheet for Patients: EntrepreneurPulse.com.au  Fact Sheet for Healthcare Providers: IncredibleEmployment.be  This test is not yet approved or cleared by the [place] FDA and has been authorized for detection and/or diagnosis of SARS-CoV-2 by FDA under an Emergency Use Authorization (EUA). This EUA will remain in effect (meaning this test can be used) for the duration of the COVID-19 declaration under Section 564(b)(1) of the Act, 21 U.S.C. section 360bbb-3(b)(1), unless the authorization is  terminated or revoked.  Performed at [facility], [place] [address], [place], [place] [address]   Blood Culture (routine x 2)     Status: Abnormal   Collection Time: 08/28/21  9:38 PM   Specimen: BLOOD  Result Value Ref Range Status   Specimen Description   Final    BLOOD LEFT ANTECUBITAL Performed at [facility] [place] [address], [place], [place] [address]    Special Requests   Final    BOTTLES DRAWN AEROBIC AND ANAEROBIC Blood Culture results may not be optimal due to an excessive volume of blood received in culture bottles Performed at [place] [address] [place], [place] [address]    Culture  Setup Time   Final    GRAM POSITIVE COCCI IN CLUSTERS ANAEROBIC BOTTLE ONLY CRITICAL RESULT CALLED TO, READ BACK BY AND VERIFIED WITH: Ramon Dredge [id number] FCP Performed at [facility], [place] [address], [place], [place] [address]    Culture STAPHYLOCOCCUS EPIDERMIDIS (A)  Final   Report Status 08/31/2021 FINAL  Final   Organism ID, Bacteria STAPHYLOCOCCUS EPIDERMIDIS  Final      Susceptibility  Staphylococcus epidermidis - MIC*    CIPROFLOXACIN 4 RESISTANT Resistant     ERYTHROMYCIN <=0.25 SENSITIVE Sensitive     GENTAMICIN <=0.5 SENSITIVE Sensitive     OXACILLIN >=4 RESISTANT Resistant     TETRACYCLINE <=1 SENSITIVE Sensitive     VANCOMYCIN 1 SENSITIVE Sensitive     TRIMETH/SULFA <=10 SENSITIVE Sensitive     CLINDAMYCIN <=0.25 SENSITIVE Sensitive     RIFAMPIN <=0.5 SENSITIVE Sensitive     Inducible Clindamycin NEGATIVE Sensitive     * STAPHYLOCOCCUS EPIDERMIDIS  Blood Culture (routine x 2)     Status: Abnormal   Collection Time: 08/28/21  9:38 PM   Specimen: BLOOD  Result Value Ref Range Status   Specimen Description   Final    BLOOD RIGHT ANTECUBITAL Performed at [facility] [address] [place], [place] [address]    Special Requests   Final    BOTTLES DRAWN AEROBIC AND  ANAEROBIC Blood Culture results may not be optimal due to an excessive volume of blood received in culture bottles Performed at [place] [address], [place], [place] [address]    Culture  Setup Time   Final    GRAM POSITIVE COCCI IN CLUSTERS IN BOTH AEROBIC AND ANAEROBIC BOTTLES CRITICAL RESULT CALLED TO, READ BACK BY AND VERIFIED WITH: PHARMD LEANNE P [phone] [id number] FCP    Culture (A)  Final    STAPHYLOCOCCUS EPIDERMIDIS SUSCEPTIBILITIES PERFORMED ON PREVIOUS CULTURE WITHIN THE LAST 5 DAYS. Performed at [facility], [place] [address], [place], [place] [address]    Report Status 08/31/2021 FINAL  Final  Blood Culture ID Panel (Reflexed)     Status: Abnormal   Collection Time: 08/28/21  9:38 PM  Result Value Ref Range Status   Enterococcus faecalis NOT DETECTED NOT DETECTED Final   Enterococcus Faecium NOT DETECTED NOT DETECTED Final   Listeria monocytogenes NOT DETECTED NOT DETECTED Final   Staphylococcus species DETECTED (A) NOT DETECTED Final    Comment: CRITICAL RESULT CALLED TO, READ BACK BY AND VERIFIED WITH: PHARMD LEANNE P [phone] [id number] FCP    Staphylococcus aureus (BCID) NOT DETECTED NOT DETECTED Final   Staphylococcus epidermidis DETECTED (A) NOT DETECTED Final    Comment: Methicillin (oxacillin) resistant coagulase negative staphylococcus. Possible blood culture contaminant (unless isolated from more than one blood culture draw or clinical case suggests pathogenicity). No antibiotic treatment is indicated for blood  culture contaminants. CRITICAL RESULT CALLED TO, READ BACK BY AND VERIFIED WITH: PHARMD LEANNE P [phone] [id number] FCP    Staphylococcus lugdunensis NOT DETECTED NOT DETECTED Final   Streptococcus species NOT DETECTED NOT DETECTED Final   Streptococcus agalactiae NOT DETECTED NOT DETECTED Final   Streptococcus pneumoniae NOT DETECTED NOT DETECTED Final   Streptococcus pyogenes NOT DETECTED NOT DETECTED Final   A.calcoaceticus-baumannii  NOT DETECTED NOT DETECTED Final   Bacteroides fragilis NOT DETECTED NOT DETECTED Final   Enterobacterales NOT DETECTED NOT DETECTED Final   Enterobacter cloacae complex NOT DETECTED NOT DETECTED Final   Escherichia coli NOT DETECTED NOT DETECTED Final   Klebsiella aerogenes NOT DETECTED NOT DETECTED Final   Klebsiella oxytoca NOT DETECTED NOT DETECTED Final   Klebsiella pneumoniae NOT DETECTED NOT DETECTED Final   Proteus species NOT DETECTED NOT DETECTED Final   Salmonella species NOT DETECTED NOT DETECTED Final   Serratia marcescens NOT DETECTED NOT DETECTED Final   Haemophilus influenzae NOT DETECTED NOT DETECTED Final   Neisseria meningitidis NOT DETECTED NOT DETECTED Final   Pseudomonas  aeruginosa NOT DETECTED NOT DETECTED Final   Stenotrophomonas maltophilia NOT DETECTED NOT DETECTED Final   Candida albicans NOT DETECTED NOT DETECTED Final   Candida auris NOT DETECTED NOT DETECTED Final   Candida glabrata NOT DETECTED NOT DETECTED Final   Candida krusei NOT DETECTED NOT DETECTED Final   Candida parapsilosis NOT DETECTED NOT DETECTED Final   Candida tropicalis NOT DETECTED NOT DETECTED Final   Cryptococcus neoformans/gattii NOT DETECTED NOT DETECTED Final   Methicillin resistance mecA/C DETECTED (A) NOT DETECTED Final    Comment: CRITICAL RESULT CALLED TO, READ BACK BY AND VERIFIED WITH: Ramon Dredge [id number] FCP Performed at [facility], [place] [address] [place], [place] [address]   MRSA Next Gen by PCR, Nasal     Status: None   Collection Time: 08/29/21  2:08 AM   Specimen: Nasal Mucosa; Nasal Swab  Result Value Ref Range Status   MRSA by PCR Next Gen NOT DETECTED NOT DETECTED Final    Comment: (NOTE) The GeneXpert MRSA Assay (FDA approved for NASAL specimens only), is one component of a comprehensive MRSA colonization surveillance program. It is not intended to diagnose MRSA infection nor to guide or monitor treatment for MRSA infections. Test performance is  not FDA approved in patients less than 71 years old. Performed at [facility], [place] [address], [place], [place] [address]   Urine Culture     Status: None   Collection Time: 08/29/21  1:20 PM   Specimen: Urine, Random  Result Value Ref Range Status   Specimen Description   Final    URINE, RANDOM Performed at [place] [address], [place], [place] [address]    Special Requests   Final    NONE Performed at [facility], [place] [address] [place], [place] [address]    Culture   Final    NO GROWTH Performed at [facility], [place] [address], [place], [place] [address]    Report Status 08/30/2021 FINAL  Final         Radiology Studies: ECHOCARDIOGRAM COMPLETE  Result Date: 08/30/2021    ECHOCARDIOGRAM REPORT   Patient Name:   ENDER RORKE [facility] Date of Exam: 08/30/2021 Medical Rec #:  [id number]        Height:       69.0 in Accession #:    [id number]       Weight:       150.4 lb Date of Birth:  [birth year]       BSA:          1.830 m Patient Age:    47 years         BP:           85/60 mmHg Patient Gender: M                HR:           90 bpm. Exam Location:  Inpatient Procedure: 2D Echo, Cardiac Doppler, Color Doppler and Intracardiac            Opacification Agent Indications:    I48.91* Unspeicified atrial fibrillation  History:        Patient has no prior history of Echocardiogram examinations.                 Risk Factors:Hypertension.  Sonographer:    Glo Herring Referring Phys: 66 Octa Uplinger V Everardo Voris IMPRESSIONS  1. POor acoustic windows limit study. Laterall hypokinesis (  mid/distal), basal inferior hypokinesis.. Left ventricular ejection fraction, by estimation, is 45 to 50%. The left ventricle has mildly decreased function. Left ventricular diastolic parameters are indeterminate.  2. Right ventricular systolic function is normal. The right ventricular size is normal.  3. Mitral regurgitation is eccentric directed  along posterior wall of left atrium . Mild to moderate mitral valve regurgitation.  4. AV is thickened, calcified with restricted motion Peak and mean gradients through the valve are 42 and 24 mm Hg respectively. Stroke volume index is 17, suggesting this may be low gradient AS. Dimensionless index is 18, consistent with severe AS>. The aortic valve was not well visualized. Aortic valve regurgitation is not visualized.  5. The inferior vena cava is normal in size with <50% respiratory variability, suggesting right atrial pressure of 8 mmHg. FINDINGS  Left Ventricle: POor acoustic windows limit study. Laterall hypokinesis (mid/distal), basal inferior hypokinesis. Left ventricular ejection fraction, by estimation, is 45 to 50%. The left ventricle has mildly decreased function. The left ventricular internal cavity size was normal in size. Left ventricular diastolic parameters are indeterminate. Right Ventricle: The right ventricular size is normal. Right vetricular wall thickness was not assessed. Right ventricular systolic function is normal. Left Atrium: Left atrial size was normal in size. Right Atrium: Right atrial size was normal in size. Pericardium: There is no evidence of pericardial effusion. Mitral Valve: Mitral regurgitation is eccentric directed along posterior wall of left atrium. There is mild thickening of the mitral valve leaflet(s). Mild mitral annular calcification. Mild to moderate mitral valve regurgitation. Tricuspid Valve: The tricuspid valve is normal in structure. Tricuspid valve regurgitation is mild. Aortic Valve: AV is thickened, calcified with restricted motion Peak and mean gradients through the valve are 42 and 24 mm Hg respectively. Stroke volume index is 17, suggesting this may be low gradient AS. Dimensionless index is 18, consistent with severe AS>. The aortic valve was not well visualized. Aortic valve regurgitation is not visualized. Aortic valve mean gradient measures 23.8 mmHg.  Aortic valve peak gradient measures 39.7 mmHg. Aortic valve area, by VTI measures 0.50 cm. Pulmonic Valve: The pulmonic valve was not well visualized. Pulmonic valve regurgitation is not visualized. No evidence of pulmonic stenosis. Aorta: The aortic root and ascending aorta are structurally normal, with no evidence of dilitation. Venous: The inferior vena cava is normal in size with less than 50% respiratory variability, suggesting right atrial pressure of 8 mmHg. IAS/Shunts: No atrial level shunt detected by color flow Doppler.  LEFT VENTRICLE PLAX 2D LVOT diam:     1.90 cm     Diastology LV SV:         31          LV e' medial:    6.71 cm/s LV SV Index:   17          LV E/e' medial:  19.4 LVOT Area:     2.84 cm    LV e' lateral:   6.89 cm/s                            LV E/e' lateral: 18.9  LV Volumes (MOD) LV vol d, MOD A2C: 64.5 ml LV vol d, MOD A4C: 55.5 ml LV vol s, MOD A2C: 30.0 ml LV vol s, MOD A4C: 34.1 ml LV SV MOD A2C:     34.5 ml LV SV MOD A4C:     55.5 ml LV SV MOD BP:  30.5 ml RIGHT VENTRICLE          IVC RV Basal diam:  3.20 cm  IVC diam: 1.90 cm LEFT ATRIUM             Index        RIGHT ATRIUM           Index LA Vol (A2C):   42.8 ml 23.39 ml/m  RA Area:     16.50 cm LA Vol (A4C):   39.0 ml 21.31 ml/m  RA Volume:   45.50 ml  24.86 ml/m LA Biplane Vol: 41.1 ml 22.46 ml/m  AORTIC VALVE                     PULMONIC VALVE AV Area (Vmax):    0.44 cm      PV Vmax:       0.91 m/s AV Area (Vmean):   0.41 cm      PV Peak grad:  3.3 mmHg AV Area (VTI):     0.50 cm AV Vmax:           315.00 cm/s AV Vmean:          226.750 cm/s AV VTI:            0.616 m AV Peak Grad:      39.7 mmHg AV Mean Grad:      23.8 mmHg LVOT Vmax:         48.93 cm/s LVOT Vmean:        32.800 cm/s LVOT VTI:          0.109 m LVOT/AV VTI ratio: 0.18  AORTA Ao Root diam: 2.70 cm Ao Asc diam:  2.90 cm MITRAL VALVE                TRICUSPID VALVE MV Area (PHT): 4.25 cm     TR Peak grad:   33.2 mmHg MV Decel Time: 179 msec     TR  Vmax:        288.00 cm/s MV E velocity: 130.33 cm/s                             SHUNTS                             Systemic VTI:  0.11 m                             Systemic Diam: 1.90 cm Dorris Carnes MD Electronically signed by Dorris Carnes MD Signature Date/Time: 08/30/2021/3:35:13 PM    Final         Scheduled Meds:  apixaban  2.5 mg Oral BID   Chlorhexidine Gluconate Cloth  6 each Topical Q0600   fluticasone  2 spray Each Nare Daily   hydrocortisone sod succinate (SOLU-CORTEF) inj  25 mg Intravenous Q12H   levalbuterol  0.63 mg Nebulization BID   loratadine  10 mg Oral Daily   mouth rinse  15 mL Mouth Rinse BID   melatonin  5 mg Oral Once   metoprolol succinate  25 mg Oral Daily   oseltamivir  30 mg Oral BID   pantoprazole  40 mg Oral Q0600   potassium chloride  40 mEq Oral Once   QUEtiapine  12.5 mg Oral QHS   rosuvastatin  10 mg Oral Daily  sodium bicarbonate  650 mg Oral BID   Continuous Infusions:  sodium chloride Stopped (08/31/21 0447)   amiodarone 30 mg/hr (08/31/21 0836)   vancomycin Stopped (08/31/21 0109)     LOS: 3 days    Time spent: 40 minutes    Irine Seal, MD [facility]   To contact the attending provider between 7A-7P or the covering provider during after hours 7P-7A, please log into the web site [email or web address] and access using universal [place] password for that web site. If you do not have the password, please call the hospital operator.  08/31/2021, 10:58 AM

## 2021-09-01 ENCOUNTER — Inpatient Hospital Stay (HOSPITAL_COMMUNITY): Payer: Medicare Other

## 2021-09-01 DIAGNOSIS — R7881 Bacteremia: Secondary | ICD-10-CM | POA: Diagnosis not present

## 2021-09-01 DIAGNOSIS — J101 Influenza due to other identified influenza virus with other respiratory manifestations: Secondary | ICD-10-CM | POA: Diagnosis not present

## 2021-09-01 DIAGNOSIS — R0602 Shortness of breath: Secondary | ICD-10-CM

## 2021-09-01 DIAGNOSIS — I480 Paroxysmal atrial fibrillation: Secondary | ICD-10-CM | POA: Diagnosis not present

## 2021-09-01 DIAGNOSIS — J1100 Influenza due to unidentified influenza virus with unspecified type of pneumonia: Secondary | ICD-10-CM

## 2021-09-01 DIAGNOSIS — A419 Sepsis, unspecified organism: Secondary | ICD-10-CM | POA: Diagnosis not present

## 2021-09-01 DIAGNOSIS — I10 Essential (primary) hypertension: Secondary | ICD-10-CM | POA: Diagnosis not present

## 2021-09-01 LAB — CBC WITH DIFFERENTIAL/PLATELET
Abs Immature Granulocytes: 0.08 10*3/uL — ABNORMAL HIGH (ref 0.00–0.07)
Basophils Absolute: 0 10*3/uL (ref 0.0–0.1)
Basophils Relative: 0 %
Eosinophils Absolute: 0 10*3/uL (ref 0.0–0.5)
Eosinophils Relative: 0 %
HCT: 40.7 % (ref 39.0–52.0)
Hemoglobin: 12.9 g/dL — ABNORMAL LOW (ref 13.0–17.0)
Immature Granulocytes: 1 %
Lymphocytes Relative: 6 %
Lymphs Abs: 0.7 10*3/uL (ref 0.7–4.0)
MCH: 29.9 pg (ref 26.0–34.0)
MCHC: 31.7 g/dL (ref 30.0–36.0)
MCV: 94.4 fL (ref 80.0–100.0)
Monocytes Absolute: 0.9 10*3/uL (ref 0.1–1.0)
Monocytes Relative: 7 %
Neutro Abs: 9.9 10*3/uL — ABNORMAL HIGH (ref 1.7–7.7)
Neutrophils Relative %: 86 %
Platelets: 144 10*3/uL — ABNORMAL LOW (ref 150–400)
RBC: 4.31 MIL/uL (ref 4.22–5.81)
RDW: 14.6 % (ref 11.5–15.5)
WBC: 11.7 10*3/uL — ABNORMAL HIGH (ref 4.0–10.5)
nRBC: 0 % (ref 0.0–0.2)

## 2021-09-01 LAB — BRAIN NATRIURETIC PEPTIDE: B Natriuretic Peptide: 298.8 pg/mL — ABNORMAL HIGH (ref 0.0–100.0)

## 2021-09-01 LAB — BASIC METABOLIC PANEL
Anion gap: 8 (ref 5–15)
BUN: 39 mg/dL — ABNORMAL HIGH (ref 8–23)
CO2: 20 mmol/L — ABNORMAL LOW (ref 22–32)
Calcium: 8.2 mg/dL — ABNORMAL LOW (ref 8.9–10.3)
Chloride: 107 mmol/L (ref 98–111)
Creatinine, Ser: 1.54 mg/dL — ABNORMAL HIGH (ref 0.61–1.24)
GFR, Estimated: 42 mL/min — ABNORMAL LOW (ref >60)
Glucose, Bld: 162 mg/dL — ABNORMAL HIGH (ref 70–99)
Potassium: 3.4 mmol/L — ABNORMAL LOW (ref 3.5–5.1)
Sodium: 135 mmol/L (ref 135–145)

## 2021-09-01 LAB — MAGNESIUM: Magnesium: 2 mg/dL (ref 1.7–2.4)

## 2021-09-01 MED ORDER — FUROSEMIDE 10 MG/ML IJ SOLN
20.0000 mg | Freq: Once | INTRAMUSCULAR | Status: AC
  Administered 2021-09-01: 20 mg via INTRAVENOUS
  Filled 2021-09-01: qty 2

## 2021-09-01 MED ORDER — DM-GUAIFENESIN ER 30-600 MG PO TB12
1.0000 | ORAL_TABLET | Freq: Two times a day (BID) | ORAL | Status: DC
  Administered 2021-09-01 – 2021-09-06 (×11): 1 via ORAL
  Filled 2021-09-01 (×11): qty 1

## 2021-09-01 MED ORDER — POTASSIUM CHLORIDE 20 MEQ PO PACK
40.0000 meq | PACK | Freq: Two times a day (BID) | ORAL | Status: AC
  Administered 2021-09-01 (×2): 40 meq via ORAL
  Filled 2021-09-01 (×2): qty 2

## 2021-09-01 MED ORDER — BENZONATATE 100 MG PO CAPS
100.0000 mg | ORAL_CAPSULE | Freq: Three times a day (TID) | ORAL | Status: DC | PRN
  Administered 2021-09-05: 11:00:00 100 mg via ORAL
  Filled 2021-09-01 (×2): qty 1

## 2021-09-01 MED ORDER — AMIODARONE HCL 200 MG PO TABS
200.0000 mg | ORAL_TABLET | Freq: Two times a day (BID) | ORAL | Status: DC
  Administered 2021-09-01 – 2021-09-06 (×11): 200 mg via ORAL
  Filled 2021-09-01 (×11): qty 1

## 2021-09-01 NOTE — Progress Notes (Signed)
Progress Note  Patient Name: Casey Lee Date of Encounter: 09/01/2021  [facility] Cardiologist: Buford Dresser, MD   Subjective   No acute events overnight. Remains short of breath, but tolerated eating breakfast this AM. Asking how long he will be in the hospital.  Inpatient Medications    Scheduled Meds:  apixaban  2.5 mg Oral BID   Chlorhexidine Gluconate Cloth  6 each Topical Q0600   fluticasone  2 spray Each Nare Daily   hydrocortisone sod succinate (SOLU-CORTEF) inj  25 mg Intravenous Q12H   levalbuterol  0.63 mg Nebulization BID   loratadine  10 mg Oral Daily   mouth rinse  15 mL Mouth Rinse BID   melatonin  5 mg Oral Once   metoprolol succinate  25 mg Oral Daily   oseltamivir  30 mg Oral BID   pantoprazole  40 mg Oral Q0600   potassium chloride  40 mEq Oral BID   rosuvastatin  10 mg Oral Daily   sodium bicarbonate  650 mg Oral BID   Continuous Infusions:  sodium chloride 10 mL/hr at 09/01/21 0759   amiodarone 30 mg/hr (09/01/21 0759)   vancomycin Stopped (09/01/21 0238)   PRN Meds: sodium chloride, acetaminophen **OR** acetaminophen, benzonatate, levalbuterol, QUEtiapine   Vital Signs    Vitals:   09/01/21 0600 09/01/21 0700 09/01/21 0800 09/01/21 1020  BP: (!) 122/93 138/80  100/70  Pulse: 77 92  98  Resp: (!) 32 (!) 26    Temp:   98.1 F (36.7 C)   TempSrc:   Oral   SpO2: 97% 93%    Weight:      Height:        Intake/Output Summary (Last 24 hours) at 09/01/2021 1117 Last data filed at 09/01/2021 0759 Gross per 24 hour  Intake 675.87 ml  Output 750 ml  Net -74.13 ml   Last 3 Weights 09/01/2021 08/31/2021 08/30/2021  Weight (lbs) 155 lb 13.8 oz 152 lb 1.9 oz 150 lb 5.7 oz  Weight (kg) 70.7 kg 69 kg 68.2 kg      Telemetry    Atrial fibrillation, rates improved, 80s overnight, 90s-100 intermittent when awake- Personally Reviewed  ECG    No new since 11/27 - Personally Reviewed  Physical Exam   GEN: Well nourished,  well developed in no acute distress. [place] in place HEENT: Normal, moist mucous membranes NECK: No JVD CARDIAC: irregularly irregular rhythm, normal S1 and S2, no rubs or gallops. 2/6 systolic murmur. VASCULAR: Radial and DP pulses 2+ bilaterally. No carotid bruits RESPIRATORY:  improved today, no significant wheezing, clear in upper fields with mild crackles at bases ABDOMEN: Soft, non-tender, non-distended MUSCULOSKELETAL:  moves all 4 limbs independently SKIN: Warm and dry, no edema NEUROLOGIC:  Alert and oriented x 3. No focal neuro deficits noted. PSYCHIATRIC:  Normal affect    Labs    High Sensitivity Troponin:  No results for input(s): TROPONINIHS in the last 720 hours.   Chemistry Recent Labs  Lab 08/28/21 2143 08/29/21 0225 08/30/21 0257 08/31/21 0239 09/01/21 0250  NA 137 137 138 137 135  K 3.9 4.2 3.9 3.3* 3.4*  CL 109 107 104 106 107  CO2 20* 20* 18* 18* 20*  GLUCOSE 200* 184* 155* 173* 162*  BUN 31* 30* 31* 36* 39*  CREATININE 1.64* 1.48* 1.58* 1.52* 1.54*  CALCIUM 8.6* 8.6* 8.9 8.7* 8.2*  MG  --   --  1.8 1.8 2.0  PROT 6.3* 6.2*  --   --   --  ALBUMIN 3.5 3.3*  --   --   --   AST 21 33  --   --   --   ALT 18 25  --   --   --   ALKPHOS 50 53  --   --   --   BILITOT 1.0 0.9  --   --   --   GFRNONAA 39* 44* 41* 43* 42*  ANIONGAP 8 10 16* 13 8    Lipids No results for input(s): CHOL, TRIG, HDL, LABVLDL, LDLCALC, CHOLHDL in the last 168 hours.  Hematology Recent Labs  Lab 08/30/21 0257 08/31/21 0239 09/01/21 0250  WBC 18.8* 15.4* 11.7*  RBC 4.45 4.28 4.31  HGB 13.4 13.0 12.9*  HCT 41.1 40.3 40.7  MCV 92.4 94.2 94.4  MCH 30.1 30.4 29.9  MCHC 32.6 32.3 31.7  RDW 14.4 14.7 14.6  PLT 151 137* 144*   Thyroid  Recent Labs  Lab 08/30/21 0257  TSH 1.414    BNPNo results for input(s): BNP, PROBNP in the last 168 hours.  DDimer No results for input(s): DDIMER in the last 168 hours.   Radiology    DG Chest 2 View  Result Date: 09/01/2021 CLINICAL  DATA:  Fever, cough. EXAM: CHEST - 2 VIEW COMPARISON:  August 28, 2021. FINDINGS: Stable cardiomediastinal silhouette. No pneumothorax is noted. Small bilateral pleural effusions are noted with adjacent subsegmental atelectasis or edema. Bony thorax is unremarkable. IMPRESSION: Mild bibasilar subsegmental atelectasis or edema is noted with small bilateral pleural effusions. Electronically Signed   By: Marijo Conception M.D.   On: 09/01/2021 09:47   ECHOCARDIOGRAM COMPLETE  Result Date: 08/30/2021    ECHOCARDIOGRAM REPORT   Patient Name:   Casey Lee [facility] Date of Exam: 08/30/2021 Medical Rec #:  [id number]        Height:       69.0 in Accession #:    [id number]       Weight:       150.4 lb Date of Birth:  1927/12/05       BSA:          1.830 m Patient Age:    87 years         BP:           85/60 mmHg Patient Gender: M                HR:           90 bpm. Exam Location:  Inpatient Procedure: 2D Echo, Cardiac Doppler, Color Doppler and Intracardiac            Opacification Agent Indications:    I48.91* Unspeicified atrial fibrillation  History:        Patient has no prior history of Echocardiogram examinations.                 Risk Factors:Hypertension.  Sonographer:    Glo Herring Referring Phys: [id number] DANIEL V THOMPSON IMPRESSIONS  1. POor acoustic windows limit study. Laterall hypokinesis (mid/distal), basal inferior hypokinesis.. Left ventricular ejection fraction, by estimation, is 45 to 50%. The left ventricle has mildly decreased function. Left ventricular diastolic parameters are indeterminate.  2. Right ventricular systolic function is normal. The right ventricular size is normal.  3. Mitral regurgitation is eccentric directed along posterior wall of left atrium . Mild to moderate mitral valve regurgitation.  4. AV is thickened, calcified with restricted motion Peak and mean gradients through the valve are 42 and 24 mm  Hg respectively. Stroke volume index is 17, suggesting this may be low gradient AS.  Dimensionless index is 18, consistent with severe AS>. The aortic valve was not well visualized. Aortic valve regurgitation is not visualized.  5. The inferior vena cava is normal in size with <50% respiratory variability, suggesting right atrial pressure of 8 mmHg. FINDINGS  Left Ventricle: POor acoustic windows limit study. Laterall hypokinesis (mid/distal), basal inferior hypokinesis. Left ventricular ejection fraction, by estimation, is 45 to 50%. The left ventricle has mildly decreased function. The left ventricular internal cavity size was normal in size. Left ventricular diastolic parameters are indeterminate. Right Ventricle: The right ventricular size is normal. Right vetricular wall thickness was not assessed. Right ventricular systolic function is normal. Left Atrium: Left atrial size was normal in size. Right Atrium: Right atrial size was normal in size. Pericardium: There is no evidence of pericardial effusion. Mitral Valve: Mitral regurgitation is eccentric directed along posterior wall of left atrium. There is mild thickening of the mitral valve leaflet(s). Mild mitral annular calcification. Mild to moderate mitral valve regurgitation. Tricuspid Valve: The tricuspid valve is normal in structure. Tricuspid valve regurgitation is mild. Aortic Valve: AV is thickened, calcified with restricted motion Peak and mean gradients through the valve are 42 and 24 mm Hg respectively. Stroke volume index is 17, suggesting this may be low gradient AS. Dimensionless index is 18, consistent with severe AS>. The aortic valve was not well visualized. Aortic valve regurgitation is not visualized. Aortic valve mean gradient measures 23.8 mmHg. Aortic valve peak gradient measures 39.7 mmHg. Aortic valve area, by VTI measures 0.50 cm. Pulmonic Valve: The pulmonic valve was not well visualized. Pulmonic valve regurgitation is not visualized. No evidence of pulmonic stenosis. Aorta: The aortic root and ascending aorta are  structurally normal, with no evidence of dilitation. Venous: The inferior vena cava is normal in size with less than 50% respiratory variability, suggesting right atrial pressure of 8 mmHg. IAS/Shunts: No atrial level shunt detected by color flow Doppler.  LEFT VENTRICLE PLAX 2D LVOT diam:     1.90 cm     Diastology LV SV:         31          LV e' medial:    6.71 cm/s LV SV Index:   17          LV E/e' medial:  19.4 LVOT Area:     2.84 cm    LV e' lateral:   6.89 cm/s                            LV E/e' lateral: 18.9  LV Volumes (MOD) LV vol d, MOD A2C: 64.5 ml LV vol d, MOD A4C: 55.5 ml LV vol s, MOD A2C: 30.0 ml LV vol s, MOD A4C: 34.1 ml LV SV MOD A2C:     34.5 ml LV SV MOD A4C:     55.5 ml LV SV MOD BP:      30.5 ml RIGHT VENTRICLE          IVC RV Basal diam:  3.20 cm  IVC diam: 1.90 cm LEFT ATRIUM             Index        RIGHT ATRIUM           Index LA Vol (A2C):   42.8 ml 23.39 ml/m  RA Area:     16.50 cm LA Vol (  A4C):   39.0 ml 21.31 ml/m  RA Volume:   45.50 ml  24.86 ml/m LA Biplane Vol: 41.1 ml 22.46 ml/m  AORTIC VALVE                     PULMONIC VALVE AV Area (Vmax):    0.44 cm      PV Vmax:       0.91 m/s AV Area (Vmean):   0.41 cm      PV Peak grad:  3.3 mmHg AV Area (VTI):     0.50 cm AV Vmax:           315.00 cm/s AV Vmean:          226.750 cm/s AV VTI:            0.616 m AV Peak Grad:      39.7 mmHg AV Mean Grad:      23.8 mmHg LVOT Vmax:         48.93 cm/s LVOT Vmean:        32.800 cm/s LVOT VTI:          0.109 m LVOT/AV VTI ratio: 0.18  AORTA Ao Root diam: 2.70 cm Ao Asc diam:  2.90 cm MITRAL VALVE                TRICUSPID VALVE MV Area (PHT): 4.25 cm     TR Peak grad:   33.2 mmHg MV Decel Time: 179 msec     TR Vmax:        288.00 cm/s MV E velocity: 130.33 cm/s                             SHUNTS                             Systemic VTI:  0.11 m                             Systemic Diam: 1.90 cm Dorris Carnes MD Electronically signed by Dorris Carnes MD Signature Date/Time: 08/30/2021/3:35:13  PM    Final     Cardiac Studies   Echo 08/30/21 1. POor acoustic windows limit study. Laterall hypokinesis (mid/distal), basal inferior hypokinesis.. Left ventricular ejection fraction, by estimation, is 45 to 50%. The left ventricle has mildly decreased function. Left ventricular diastolic parameters are indeterminate.   2. Right ventricular systolic function is normal. The right ventricular size is normal.   3. Mitral regurgitation is eccentric directed along posterior wall of left atrium . Mild to moderate mitral valve regurgitation.   4. AV is thickened, calcified with restricted motion Peak and mean gradients through the valve are 42 and 24 mm Hg respectively. Stroke volume index is 17, suggesting this may be low gradient AS. Dimensionless index is 18, consistent with severe AS. The aortic valve was not well visualized. Aortic valve regurgitation is not visualized.   5. The inferior vena cava is normal in size with <50% respiratory variability, suggesting right atrial pressure of 8 mmHg.  Patient Profile     87 y.o. male with PMH paroxysmal atrial fibrillation, presenting with influenza A and found to have atrial fibrillation with RVR  Assessment & Plan    Atrial fibrillation, paroxysmal vs persistent Cardiomyopathy, unknown if ischemic or nonischemic. EF 45-50% -he cannot detect whether he is in  afib, difficult to determine duration -has been on apixaban, dose reduced for age and renal function -doing better on IV amiodarone, rates have come down, blood pressure improving -continue metoprolol succinate for now, no room to uptitrate based on blood pressure -given acute illness, afib with RVR, lack of chest pain will not pursue ischemic evaluation at this time. Recommend medical therapy of cardiomyopathy as tolerated -would recheck echo as an outpatient once rate is stable and patient clinically improved  -supplementing potassium today. Did not augment with PO meQ K yesterday, will give  80 mEq total today -may trial change to oral amiodarone later today, depending on how he is doing. This will likely have less rate control than IV, so may need to restart IV if rates increase significantly.  Possible aortic stenosis -gradient is moderate, but dimensionless index and stroke volume index suggest possibly severe -murmur does not sound severe -would recheck limited echo once rate slower/respiratory status improved to interrogate aortic valve, check EF. This can be done as an outpatient   Influenza A MRSE bacteremia -treatment per primary team and infectious disease  Defer to primary team/ID on when he is approaching discharge. Does not seem clinically ready today, though asking when he can go home. Does report that his wife has an O2 machine at home if he needs to be discharged on oxygen.  For questions or updates, please contact [place] Please consult [email or web address] for contact info under     Signed, Buford Dresser, MD  09/01/2021, 11:17 AM

## 2021-09-01 NOTE — Progress Notes (Signed)
PROGRESS NOTE    Casey Lee  FBP:[id number] DOB: 1928-09-12 DOA: 08/28/2021 PCP: [name], MD   Chief Complaint  Patient presents with   Fever   Weakness    Brief Narrative:  87 year old gentleman with prior history of temporal arteritis on prednisone, stage IIIa CKD, hyperlipidemia, atrial fibrillation presents with cough fevers and chills.  Patient was found to to have influenza A positive. Assessment & Plan:   Principal Problem:   Sepsis ([place]) Active Problems:   Essential hypertension   HLD (hyperlipidemia)   Temporal arteritis (HCC)   PAF (paroxysmal atrial fibrillation) (HCC)   CAP (community acquired pneumonia)   Influenza A   Generalized weakness   Bacteremia due to Gram-positive bacteria   MRSE bacteremia   Hypokalemia   Sepsis secondary to pneumonia/staph epidermidis bacteremia/influenza positive infection Improving sepsis physiology. Patient currently afebrile. Initial blood cultures were positive for staph epidermidis, repeat blood cultures have been negative so far ID consulted, recommendations for the duration of antibiotics pending. 2D echocardiogram is negative for vegetations.    Influenza A positive infection Continue the course of Tamiflu and continue with symptomatic management with bronchodilators Flonase PPI.     Atrial fibrillation with RVR probably secondary to acute infection TSH within normal limits Echocardiogram shows left ventricular ejection fraction of 45 to 50%, with lateral hypokinesis, basal inferior wall hypokinesis.  Patient currently on IV amiodarone and oral Toprol as per cardiology Patient is on Eliquis for anticoagulation.     History of temporal arteritis Was on prednisone but currently on Solu-Cortef. Gradually weaned off Solu-Cortef to prednisone.  Hyperlipidemia Continue with statin   Acute metabolic encephalopathy Probably secondary to sepsis, pneumonia, influenza infection. Patient currently is  alert and oriented and answering questions appropriately    History of chronic thrombocytopenia Continue to monitor.   Stage IIIa CKD Continue to monitor.    Hypokalemia Replaced.     DVT prophylaxis: Eliquis Code Status: Full code Family Communication: Family at bedside disposition:   Status is: Inpatient  Remains inpatient appropriate because: IV amiodarone       Consultants:  Cardiology Infectious disease Procedures: Echocardiogram Antimicrobials:  Antibiotics Given (last 72 hours)     Date/Time Action Medication Dose Rate   08/29/21 2155 New Bag/Given   cefTRIAXone (ROCEPHIN) 2 g in sodium chloride 0.9 % 100 mL IVPB 2 g 200 mL/hr   08/29/21 2250 New Bag/Given   azithromycin (ZITHROMAX) 500 mg in sodium chloride 0.9 % 250 mL IVPB 500 mg 250 mL/hr   08/30/21 0045 New Bag/Given   vancomycin (VANCOREADY) IVPB 1500 mg/300 mL 1,500 mg 150 mL/hr   08/30/21 1016 Given   oseltamivir (TAMIFLU) capsule 30 mg 30 mg    08/30/21 2248 Given   oseltamivir (TAMIFLU) capsule 30 mg 30 mg    08/31/21 0009 New Bag/Given   vancomycin (VANCOREADY) IVPB 750 mg/150 mL 750 mg 150 mL/hr   08/31/21 1023 Given   oseltamivir (TAMIFLU) capsule 30 mg 30 mg    08/31/21 2251 Given   oseltamivir (TAMIFLU) capsule 30 mg 30 mg    09/01/21 0138 New Bag/Given   vancomycin (VANCOREADY) IVPB 750 mg/150 mL 750 mg 150 mL/hr   09/01/21 1021 Given   oseltamivir (TAMIFLU) capsule 30 mg 30 mg         Subjective: SOME COUGHING,  Some sob.  Objective: Vitals:   09/01/21 1000 09/01/21 1020 09/01/21 1100 09/01/21 1127  BP: (!) 139/121 100/70 114/70   Pulse: 96 98 97 96  Resp: Marland Kitchen)  37  (!) 30 (!) 32  Temp:    97.9 F (36.6 C)  TempSrc:    Oral  SpO2: 95%  95% 95%  Weight:      Height:        Intake/Output Summary (Last 24 hours) at 09/01/2021 1310 Last data filed at 09/01/2021 1100 Gross per 24 hour  Intake 663.42 ml  Output 750 ml  Net -86.58 ml   Filed Weights   08/30/21  0540 08/31/21 0500 09/01/21 0500  Weight: 68.2 kg 69 kg 70.7 kg    Examination:  General exam: Elderly gentleman on 2 L of nasal cannula oxygen, not in any kind of distress Respiratory system: Basilar crackles and wheezing posteriorly, on nasal cannula oxygen, tachypnea present Cardiovascular system: S1 & S2 heard, IRREGULAR, tachycardic no JVD,  No pedal edema. Gastrointestinal system: Abdomen is nondistended, soft and nontender. . Normal bowel sounds heard. Central nervous system: Alert and oriented. No focal neurological deficits. Extremities: Symmetric 5 x 5 power. Skin: No rashes, lesions or ulcers Psychiatry: Mood & affect appropriate.     Data Reviewed: I have personally reviewed following labs and imaging studies  CBC: Recent Labs  Lab 08/28/21 2143 08/29/21 0225 08/30/21 0257 08/31/21 0239 09/01/21 0250  WBC 9.4 9.1 18.8* 15.4* 11.7*  NEUTROABS 6.8 7.7 16.7* 13.6* 9.9*  HGB 13.5 13.7 13.4 13.0 12.9*  HCT 41.7 41.9 41.1 40.3 40.7  MCV 93.5 93.7 92.4 94.2 94.4  PLT 145* 144* 151 137* 144*    Basic Metabolic Panel: Recent Labs  Lab 08/28/21 2143 08/29/21 0225 08/30/21 0257 08/31/21 0239 09/01/21 0250  NA 137 137 138 137 135  K 3.9 4.2 3.9 3.3* 3.4*  CL 109 107 104 106 107  CO2 20* 20* 18* 18* 20*  GLUCOSE 200* 184* 155* 173* 162*  BUN 31* 30* 31* 36* 39*  CREATININE 1.64* 1.48* 1.58* 1.52* 1.54*  CALCIUM 8.6* 8.6* 8.9 8.7* 8.2*  MG  --   --  1.8 1.8 2.0    GFR: Estimated Creatinine Clearance: 31.2 mL/min (A) (by C-G formula based on SCr of 1.54 mg/dL (H)).  Liver Function Tests: Recent Labs  Lab 08/28/21 2143 08/29/21 0225  AST 21 33  ALT 18 25  ALKPHOS 50 53  BILITOT 1.0 0.9  PROT 6.3* 6.2*  ALBUMIN 3.5 3.3*    CBG: No results for input(s): GLUCAP in the last 168 hours.   Recent Results (from the past 240 hour(s))  Resp Panel by RT-PCR (Flu A&B, Covid) Nasopharyngeal Swab     Status: Abnormal   Collection Time: 08/28/21  9:32 PM    Specimen: Nasopharyngeal Swab; Nasopharyngeal(NP) swabs in vial transport medium  Result Value Ref Range Status   SARS Coronavirus 2 by RT PCR NEGATIVE NEGATIVE Final    Comment: (NOTE) SARS-CoV-2 target nucleic acids are NOT DETECTED.  The SARS-CoV-2 RNA is generally detectable in upper respiratory specimens during the acute phase of infection. The lowest concentration of SARS-CoV-2 viral copies this assay can detect is 138 copies/mL. A negative result does not preclude SARS-Cov-2 infection and should not be used as the sole basis for treatment or other patient management decisions. A negative result may occur with  improper specimen collection/handling, submission of specimen other than nasopharyngeal swab, presence of viral mutation(s) within the areas targeted by this assay, and inadequate number of viral copies(<138 copies/mL). A negative result must be combined with clinical observations, patient history, and epidemiological information. The expected result is Negative.  Fact Sheet for Patients:  EntrepreneurPulse.com.au  Fact Sheet for Healthcare Providers:  IncredibleEmployment.be  This test is no t yet approved or cleared by the [place] FDA and  has been authorized for detection and/or diagnosis of SARS-CoV-2 by FDA under an Emergency Use Authorization (EUA). This EUA will remain  in effect (meaning this test can be used) for the duration of the COVID-19 declaration under Section 564(b)(1) of the Act, 21 U.S.C.section 360bbb-3(b)(1), unless the authorization is terminated  or revoked sooner.       Influenza A by PCR POSITIVE (A) NEGATIVE Final   Influenza B by PCR NEGATIVE NEGATIVE Final    Comment: (NOTE) The Xpert Xpress SARS-CoV-2/FLU/RSV plus assay is intended as an aid in the diagnosis of influenza from Nasopharyngeal swab specimens and should not be used as a sole basis for treatment. Nasal washings and aspirates are  unacceptable for Xpert Xpress SARS-CoV-2/FLU/RSV testing.  Fact Sheet for Patients: EntrepreneurPulse.com.au  Fact Sheet for Healthcare Providers: IncredibleEmployment.be  This test is not yet approved or cleared by the [place] FDA and has been authorized for detection and/or diagnosis of SARS-CoV-2 by FDA under an Emergency Use Authorization (EUA). This EUA will remain in effect (meaning this test can be used) for the duration of the COVID-19 declaration under Section 564(b)(1) of the Act, 21 U.S.C. section 360bbb-3(b)(1), unless the authorization is terminated or revoked.  Performed at [facility], [place] [address] [place], [place] [address]   Blood Culture (routine x 2)     Status: Abnormal   Collection Time: 08/28/21  9:38 PM   Specimen: BLOOD  Result Value Ref Range Status   Specimen Description   Final    BLOOD LEFT ANTECUBITAL Performed at [facility] [address], [place], [place] [address]    Special Requests   Final    BOTTLES DRAWN AEROBIC AND ANAEROBIC Blood Culture results may not be optimal due to an excessive volume of blood received in culture bottles Performed at [facility] [address], [place], [place] [address]    Culture  Setup Time   Final    GRAM POSITIVE COCCI IN CLUSTERS ANAEROBIC BOTTLE ONLY CRITICAL RESULT CALLED TO, READ BACK BY AND VERIFIED WITH: Ramon Dredge [id number] FCP Performed at [facility], [place] [address], [place], [place] [address]    Culture STAPHYLOCOCCUS EPIDERMIDIS (A)  Final   Report Status 08/31/2021 FINAL  Final   Organism ID, Bacteria STAPHYLOCOCCUS EPIDERMIDIS  Final      Susceptibility   Staphylococcus epidermidis - MIC*    CIPROFLOXACIN 4 RESISTANT Resistant     ERYTHROMYCIN <=0.25 SENSITIVE Sensitive     GENTAMICIN <=0.5 SENSITIVE Sensitive     OXACILLIN >=4 RESISTANT Resistant     TETRACYCLINE <=1  SENSITIVE Sensitive     VANCOMYCIN 1 SENSITIVE Sensitive     TRIMETH/SULFA <=10 SENSITIVE Sensitive     CLINDAMYCIN <=0.25 SENSITIVE Sensitive     RIFAMPIN <=0.5 SENSITIVE Sensitive     Inducible Clindamycin NEGATIVE Sensitive     * STAPHYLOCOCCUS EPIDERMIDIS  Blood Culture (routine x 2)     Status: Abnormal   Collection Time: 08/28/21  9:38 PM   Specimen: BLOOD  Result Value Ref Range Status   Specimen Description   Final    BLOOD RIGHT ANTECUBITAL Performed at [facility] [address] [place],  [address]    Special Requests   Final    BOTTLES DRAWN AEROBIC AND ANAEROBIC Blood Culture  results may not be optimal due to an excessive volume of blood received in culture bottles Performed at [facility] [address], [place], [place] [address]    Culture  Setup Time   Final    GRAM POSITIVE COCCI IN CLUSTERS IN BOTH AEROBIC AND ANAEROBIC BOTTLES CRITICAL RESULT CALLED TO, READ BACK BY AND VERIFIED WITH: PHARMD LEANNE P [id number] FCP    Culture (A)  Final    STAPHYLOCOCCUS EPIDERMIDIS SUSCEPTIBILITIES PERFORMED ON PREVIOUS CULTURE WITHIN THE LAST 5 DAYS. Performed at [facility], [place] [address], [place], [place] [address]    Report Status 08/31/2021 FINAL  Final  Blood Culture ID Panel (Reflexed)     Status: Abnormal   Collection Time: 08/28/21  9:38 PM  Result Value Ref Range Status   Enterococcus faecalis NOT DETECTED NOT DETECTED Final   Enterococcus Faecium NOT DETECTED NOT DETECTED Final   Listeria monocytogenes NOT DETECTED NOT DETECTED Final   Staphylococcus species DETECTED (A) NOT DETECTED Final    Comment: CRITICAL RESULT CALLED TO, READ BACK BY AND VERIFIED WITH: PHARMD LEANNE P [id number] FCP    Staphylococcus aureus (BCID) NOT DETECTED NOT DETECTED Final   Staphylococcus epidermidis DETECTED (A) NOT DETECTED Final    Comment: Methicillin (oxacillin) resistant coagulase negative staphylococcus. Possible  blood culture contaminant (unless isolated from more than one blood culture draw or clinical case suggests pathogenicity). No antibiotic treatment is indicated for blood  culture contaminants. CRITICAL RESULT CALLED TO, READ BACK BY AND VERIFIED WITH: PHARMD LEANNE P [id number] FCP    Staphylococcus lugdunensis NOT DETECTED NOT DETECTED Final   Streptococcus species NOT DETECTED NOT DETECTED Final   Streptococcus agalactiae NOT DETECTED NOT DETECTED Final   Streptococcus pneumoniae NOT DETECTED NOT DETECTED Final   Streptococcus pyogenes NOT DETECTED NOT DETECTED Final   A.calcoaceticus-baumannii NOT DETECTED NOT DETECTED Final   Bacteroides fragilis NOT DETECTED NOT DETECTED Final   Enterobacterales NOT DETECTED NOT DETECTED Final   Enterobacter cloacae complex NOT DETECTED NOT DETECTED Final   Escherichia coli NOT DETECTED NOT DETECTED Final   Klebsiella aerogenes NOT DETECTED NOT DETECTED Final   Klebsiella oxytoca NOT DETECTED NOT DETECTED Final   Klebsiella pneumoniae NOT DETECTED NOT DETECTED Final   Proteus species NOT DETECTED NOT DETECTED Final   Salmonella species NOT DETECTED NOT DETECTED Final   Serratia marcescens NOT DETECTED NOT DETECTED Final   Haemophilus influenzae NOT DETECTED NOT DETECTED Final   Neisseria meningitidis NOT DETECTED NOT DETECTED Final   Pseudomonas aeruginosa NOT DETECTED NOT DETECTED Final   Stenotrophomonas maltophilia NOT DETECTED NOT DETECTED Final   Candida albicans NOT DETECTED NOT DETECTED Final   Candida auris NOT DETECTED NOT DETECTED Final   Candida glabrata NOT DETECTED NOT DETECTED Final   Candida krusei NOT DETECTED NOT DETECTED Final   Candida parapsilosis NOT DETECTED NOT DETECTED Final   Candida tropicalis NOT DETECTED NOT DETECTED Final   Cryptococcus neoformans/gattii NOT DETECTED NOT DETECTED Final   Methicillin resistance mecA/C DETECTED (A) NOT DETECTED Final    Comment: CRITICAL RESULT CALLED TO, READ BACK BY AND VERIFIED  WITH: Ramon Dredge [id number] FCP Performed at [facility], [address] [place], [place] [address]   MRSA Next Gen by PCR, Nasal     Status: None   Collection Time: 08/29/21  2:08 AM   Specimen: Nasal Mucosa; Nasal Swab  Result Value Ref Range Status   MRSA by PCR Next Gen NOT  DETECTED NOT DETECTED Final    Comment: (NOTE) The GeneXpert MRSA Assay (FDA approved for NASAL specimens only), is one component of a comprehensive MRSA colonization surveillance program. It is not intended to diagnose MRSA infection nor to guide or monitor treatment for MRSA infections. Test performance is not FDA approved in patients less than 71 years old. Performed at [facility], [place] [address] [place], [place] [address]   Urine Culture     Status: None   Collection Time: 08/29/21  1:20 PM   Specimen: Urine, Random  Result Value Ref Range Status   Specimen Description   Final    URINE, RANDOM Performed at [facility] [address], [place], [place] [address]    Special Requests   Final    NONE Performed at [facility], [place] [address], [place], [place] [address]    Culture   Final    NO GROWTH Performed at [facility], [place] [address], [place], [place] [address]    Report Status 08/30/2021 FINAL  Final  Culture, blood (Routine X 2) w Reflex to ID Panel     Status: None (Preliminary result)   Collection Time: 08/31/21  2:55 PM   Specimen: Left Antecubital; Blood  Result Value Ref Range Status   Specimen Description   Final    LEFT ANTECUBITAL Performed at [facility] [address] [place], [place] [address]    Special Requests   Final    BOTTLES DRAWN AEROBIC ONLY Blood Culture adequate volume Performed at [facility] [address] [place], [place] [address]    Culture   Final    NO GROWTH < 12 HOURS Performed at [facility] [address],  [place], [place] [address]    Report Status PENDING  Incomplete  Culture, blood (Routine X 2) w Reflex to ID Panel     Status: None (Preliminary result)   Collection Time: 08/31/21  3:41 PM   Specimen: BLOOD LEFT FOREARM  Result Value Ref Range Status   Specimen Description   Final    BLOOD LEFT FOREARM Performed at [facility] [address], [place], [place] [address]    Special Requests   Final    BOTTLES DRAWN AEROBIC ONLY Blood Culture results may not be optimal due to an inadequate volume of blood received in culture bottles Performed at [facility] [address], [place], [place] [address]    Culture   Final    NO GROWTH < 12 HOURS Performed at [facility] [address], [place],  [address]    Report Status PENDING  Incomplete         Radiology Studies: DG Chest 2 View  Result Date: 09/01/2021 CLINICAL DATA:  Fever, cough. EXAM: CHEST - 2 VIEW COMPARISON:  August 28, 2021. FINDINGS: Stable cardiomediastinal silhouette. No pneumothorax is noted. Small bilateral pleural effusions are noted with adjacent subsegmental atelectasis or edema. Bony thorax is unremarkable. IMPRESSION: Mild bibasilar subsegmental atelectasis or edema is noted with small bilateral pleural effusions. Electronically Signed   By: Marijo Conception M.D.   On: 09/01/2021 09:47   ECHOCARDIOGRAM COMPLETE  Result Date: 08/30/2021    ECHOCARDIOGRAM REPORT   Patient Name:   Casey Lee [facility] Date of Exam: 08/30/2021 Medical Rec #:  [id number]        Height:       69.0 in Accession #:  [id number]       Weight:       150.4 lb Date of Birth:  01/12/1928       BSA:          1.830 m Patient Age:    89 years         BP:           85/60 mmHg Patient Gender: M                HR:           90 bpm. Exam Location:  Inpatient Procedure: 2D Echo, Cardiac Doppler, Color Doppler and Intracardiac            Opacification Agent Indications:    I48.91* Unspeicified atrial  fibrillation  History:        Patient has no prior history of Echocardiogram examinations.                 Risk Factors:Hypertension.  Sonographer:    Glo Herring Referring Phys: [id number] DANIEL V THOMPSON IMPRESSIONS  1. POor acoustic windows limit study. Laterall hypokinesis (mid/distal), basal inferior hypokinesis.. Left ventricular ejection fraction, by estimation, is 45 to 50%. The left ventricle has mildly decreased function. Left ventricular diastolic parameters are indeterminate.  2. Right ventricular systolic function is normal. The right ventricular size is normal.  3. Mitral regurgitation is eccentric directed along posterior wall of left atrium . Mild to moderate mitral valve regurgitation.  4. AV is thickened, calcified with restricted motion Peak and mean gradients through the valve are 42 and 24 mm Hg respectively. Stroke volume index is 17, suggesting this may be low gradient AS. Dimensionless index is 18, consistent with severe AS>. The aortic valve was not well visualized. Aortic valve regurgitation is not visualized.  5. The inferior vena cava is normal in size with <50% respiratory variability, suggesting right atrial pressure of 8 mmHg. FINDINGS  Left Ventricle: POor acoustic windows limit study. Laterall hypokinesis (mid/distal), basal inferior hypokinesis. Left ventricular ejection fraction, by estimation, is 45 to 50%. The left ventricle has mildly decreased function. The left ventricular internal cavity size was normal in size. Left ventricular diastolic parameters are indeterminate. Right Ventricle: The right ventricular size is normal. Right vetricular wall thickness was not assessed. Right ventricular systolic function is normal. Left Atrium: Left atrial size was normal in size. Right Atrium: Right atrial size was normal in size. Pericardium: There is no evidence of pericardial effusion. Mitral Valve: Mitral regurgitation is eccentric directed along posterior wall of left atrium. There is  mild thickening of the mitral valve leaflet(s). Mild mitral annular calcification. Mild to moderate mitral valve regurgitation. Tricuspid Valve: The tricuspid valve is normal in structure. Tricuspid valve regurgitation is mild. Aortic Valve: AV is thickened, calcified with restricted motion Peak and mean gradients through the valve are 42 and 24 mm Hg respectively. Stroke volume index is 17, suggesting this may be low gradient AS. Dimensionless index is 18, consistent with severe AS>. The aortic valve was not well visualized. Aortic valve regurgitation is not visualized. Aortic valve mean gradient measures 23.8 mmHg. Aortic valve peak gradient measures 39.7 mmHg. Aortic valve area, by VTI measures 0.50 cm. Pulmonic Valve: The pulmonic valve was not well visualized. Pulmonic valve regurgitation is not visualized. No evidence of pulmonic stenosis. Aorta: The aortic root and ascending aorta are structurally normal, with no evidence of dilitation. Venous: The inferior vena cava is normal in size with less than 50% respiratory variability, suggesting  right atrial pressure of 8 mmHg. IAS/Shunts: No atrial level shunt detected by color flow Doppler.  LEFT VENTRICLE PLAX 2D LVOT diam:     1.90 cm     Diastology LV SV:         31          LV e' medial:    6.71 cm/s LV SV Index:   17          LV E/e' medial:  19.4 LVOT Area:     2.84 cm    LV e' lateral:   6.89 cm/s                            LV E/e' lateral: 18.9  LV Volumes (MOD) LV vol d, MOD A2C: 64.5 ml LV vol d, MOD A4C: 55.5 ml LV vol s, MOD A2C: 30.0 ml LV vol s, MOD A4C: 34.1 ml LV SV MOD A2C:     34.5 ml LV SV MOD A4C:     55.5 ml LV SV MOD BP:      30.5 ml RIGHT VENTRICLE          IVC RV Basal diam:  3.20 cm  IVC diam: 1.90 cm LEFT ATRIUM             Index        RIGHT ATRIUM           Index LA Vol (A2C):   42.8 ml 23.39 ml/m  RA Area:     16.50 cm LA Vol (A4C):   39.0 ml 21.31 ml/m  RA Volume:   45.50 ml  24.86 ml/m LA Biplane Vol: 41.1 ml 22.46 ml/m   AORTIC VALVE                     PULMONIC VALVE AV Area (Vmax):    0.44 cm      PV Vmax:       0.91 m/s AV Area (Vmean):   0.41 cm      PV Peak grad:  3.3 mmHg AV Area (VTI):     0.50 cm AV Vmax:           315.00 cm/s AV Vmean:          226.750 cm/s AV VTI:            0.616 m AV Peak Grad:      39.7 mmHg AV Mean Grad:      23.8 mmHg LVOT Vmax:         48.93 cm/s LVOT Vmean:        32.800 cm/s LVOT VTI:          0.109 m LVOT/AV VTI ratio: 0.18  AORTA Ao Root diam: 2.70 cm Ao Asc diam:  2.90 cm MITRAL VALVE                TRICUSPID VALVE MV Area (PHT): 4.25 cm     TR Peak grad:   33.2 mmHg MV Decel Time: 179 msec     TR Vmax:        288.00 cm/s MV E velocity: 130.33 cm/s                             SHUNTS  Systemic VTI:  0.11 m                             Systemic Diam: 1.90 cm Dorris Carnes MD Electronically signed by Dorris Carnes MD Signature Date/Time: 08/30/2021/3:35:13 PM    Final         Scheduled Meds:  apixaban  2.5 mg Oral BID   Chlorhexidine Gluconate Cloth  6 each Topical Q0600   fluticasone  2 spray Each Nare Daily   hydrocortisone sod succinate (SOLU-CORTEF) inj  25 mg Intravenous Q12H   levalbuterol  0.63 mg Nebulization BID   loratadine  10 mg Oral Daily   mouth rinse  15 mL Mouth Rinse BID   melatonin  5 mg Oral Once   metoprolol succinate  25 mg Oral Daily   oseltamivir  30 mg Oral BID   pantoprazole  40 mg Oral Q0600   potassium chloride  40 mEq Oral BID   rosuvastatin  10 mg Oral Daily   sodium bicarbonate  650 mg Oral BID   Continuous Infusions:  sodium chloride Stopped (09/01/21 0807)   amiodarone 30 mg/hr (09/01/21 1100)   vancomycin Stopped (09/01/21 0238)     LOS: 4 days        Hosie Poisson, MD [facility]   To contact the attending provider between 7A-7P or the covering provider during after hours 7P-7A, please log into the web site [email or web address] and access using universal [place] password for that web site. If you do  not have the password, please call the hospital operator.  09/01/2021, 1:10 PM

## 2021-09-01 NOTE — Progress Notes (Signed)
[facility] for Infectious Disease    Date of Admission:  08/28/2021   Total days of antibiotics 5 oseltamivir/day 4 vanco          ID: HILLEL CARD is a 87 y.o. male with  flu with MRSE bacteremia and Afib with RVR Principal Problem:   Sepsis ([place]) Active Problems:   Essential hypertension   HLD (hyperlipidemia)   Temporal arteritis (HCC)   PAF (paroxysmal atrial fibrillation) (HCC)   CAP (community acquired pneumonia)   Influenza A   Generalized weakness   Bacteremia due to Gram-positive bacteria   MRSE bacteremia   Hypokalemia    Subjective: Afebrile. Hr in 90-100s on tele, still in afib.   Medications:   apixaban  2.5 mg Oral BID   Chlorhexidine Gluconate Cloth  6 each Topical Q0600   fluticasone  2 spray Each Nare Daily   hydrocortisone sod succinate (SOLU-CORTEF) inj  25 mg Intravenous Q12H   levalbuterol  0.63 mg Nebulization BID   loratadine  10 mg Oral Daily   mouth rinse  15 mL Mouth Rinse BID   melatonin  5 mg Oral Once   metoprolol succinate  25 mg Oral Daily   oseltamivir  30 mg Oral BID   pantoprazole  40 mg Oral Q0600   potassium chloride  40 mEq Oral BID   rosuvastatin  10 mg Oral Daily   sodium bicarbonate  650 mg Oral BID    Objective: Vital signs in last 24 hours: Temp:  [97.4 F (36.3 C)-98.9 F (37.2 C)] 98.1 F (36.7 C) (11/30 0800) Pulse Rate:  [75-134] 98 (11/30 1020) Resp:  [21-37] 26 (11/30 0700) BP: (94-138)/(63-97) 100/70 (11/30 1020) SpO2:  [93 %-98 %] 93 % (11/30 0700) Weight:  [70.7 kg] 70.7 kg (11/30 0500) Physical Exam  Constitutional: He is oriented to person, place, and time. He appears well-developed and well-nourished. No distress.  HENT:  Mouth/Throat: clear OP. No thrush Cardiac: irreg irregular and normal heart sounds. Soft systolic murmurExam reveals no gallop and no friction rub.  Pulmonary/Chest: Effort normal and breath sounds normal. No respiratory distress. He has no wheezes.  Abdominal: Soft. Bowel  sounds are normal. He exhibits no distension. There is no tenderness.  Lymphadenopathy:  He has no cervical adenopathy.  Neurological: He is alert and oriented to person, place, and time.  Skin: Skin is warm and dry. No rash noted. No erythema.  Psychiatric: He has a normal mood and affect. His behavior is normal.   Lab Results Recent Labs    08/31/21 0239 09/01/21 0250  WBC 15.4* 11.7*  HGB 13.0 12.9*  HCT 40.3 40.7  NA 137 135  K 3.3* 3.4*  CL 106 107  CO2 18* 20*  BUN 36* 39*  CREATININE 1.52* 1.54*     Microbiology: 11/29 blood cx ngtd though < 24hr Studies/Results: DG Chest 2 View  Result Date: 09/01/2021 CLINICAL DATA:  Fever, cough. EXAM: CHEST - 2 VIEW COMPARISON:  August 28, 2021. FINDINGS: Stable cardiomediastinal silhouette. No pneumothorax is noted. Small bilateral pleural effusions are noted with adjacent subsegmental atelectasis or edema. Bony thorax is unremarkable. IMPRESSION: Mild bibasilar subsegmental atelectasis or edema is noted with small bilateral pleural effusions. Electronically Signed   By: Marijo Conception M.D.   On: 09/01/2021 09:47   ECHOCARDIOGRAM COMPLETE  Result Date: 08/30/2021    ECHOCARDIOGRAM REPORT   Patient Name:   SWAN ZAYED [facility] Date of Exam: 08/30/2021 Medical Rec #:  [id number]  Height:       69.0 in Accession #:    [id number]       Weight:       150.4 lb Date of Birth:  1928-05-29       BSA:          1.830 m Patient Age:    26 years         BP:           85/60 mmHg Patient Gender: M                HR:           90 bpm. Exam Location:  Inpatient Procedure: 2D Echo, Cardiac Doppler, Color Doppler and Intracardiac            Opacification Agent Indications:    I48.91* Unspeicified atrial fibrillation  History:        Patient has no prior history of Echocardiogram examinations.                 Risk Factors:Hypertension.  Sonographer:    Glo Herring Referring Phys: [id number] DANIEL V THOMPSON IMPRESSIONS  1. POor acoustic windows limit  study. Laterall hypokinesis (mid/distal), basal inferior hypokinesis.. Left ventricular ejection fraction, by estimation, is 45 to 50%. The left ventricle has mildly decreased function. Left ventricular diastolic parameters are indeterminate.  2. Right ventricular systolic function is normal. The right ventricular size is normal.  3. Mitral regurgitation is eccentric directed along posterior wall of left atrium . Mild to moderate mitral valve regurgitation.  4. AV is thickened, calcified with restricted motion Peak and mean gradients through the valve are 42 and 24 mm Hg respectively. Stroke volume index is 17, suggesting this may be low gradient AS. Dimensionless index is 18, consistent with severe AS>. The aortic valve was not well visualized. Aortic valve regurgitation is not visualized.  5. The inferior vena cava is normal in size with <50% respiratory variability, suggesting right atrial pressure of 8 mmHg. FINDINGS  Left Ventricle: POor acoustic windows limit study. Laterall hypokinesis (mid/distal), basal inferior hypokinesis. Left ventricular ejection fraction, by estimation, is 45 to 50%. The left ventricle has mildly decreased function. The left ventricular internal cavity size was normal in size. Left ventricular diastolic parameters are indeterminate. Right Ventricle: The right ventricular size is normal. Right vetricular wall thickness was not assessed. Right ventricular systolic function is normal. Left Atrium: Left atrial size was normal in size. Right Atrium: Right atrial size was normal in size. Pericardium: There is no evidence of pericardial effusion. Mitral Valve: Mitral regurgitation is eccentric directed along posterior wall of left atrium. There is mild thickening of the mitral valve leaflet(s). Mild mitral annular calcification. Mild to moderate mitral valve regurgitation. Tricuspid Valve: The tricuspid valve is normal in structure. Tricuspid valve regurgitation is mild. Aortic Valve: AV is  thickened, calcified with restricted motion Peak and mean gradients through the valve are 42 and 24 mm Hg respectively. Stroke volume index is 17, suggesting this may be low gradient AS. Dimensionless index is 18, consistent with severe AS>. The aortic valve was not well visualized. Aortic valve regurgitation is not visualized. Aortic valve mean gradient measures 23.8 mmHg. Aortic valve peak gradient measures 39.7 mmHg. Aortic valve area, by VTI measures 0.50 cm. Pulmonic Valve: The pulmonic valve was not well visualized. Pulmonic valve regurgitation is not visualized. No evidence of pulmonic stenosis. Aorta: The aortic root and ascending aorta are structurally normal, with no evidence of dilitation. Venous: The  inferior vena cava is normal in size with less than 50% respiratory variability, suggesting right atrial pressure of 8 mmHg. IAS/Shunts: No atrial level shunt detected by color flow Doppler.  LEFT VENTRICLE PLAX 2D LVOT diam:     1.90 cm     Diastology LV SV:         31          LV e' medial:    6.71 cm/s LV SV Index:   17          LV E/e' medial:  19.4 LVOT Area:     2.84 cm    LV e' lateral:   6.89 cm/s                            LV E/e' lateral: 18.9  LV Volumes (MOD) LV vol d, MOD A2C: 64.5 ml LV vol d, MOD A4C: 55.5 ml LV vol s, MOD A2C: 30.0 ml LV vol s, MOD A4C: 34.1 ml LV SV MOD A2C:     34.5 ml LV SV MOD A4C:     55.5 ml LV SV MOD BP:      30.5 ml RIGHT VENTRICLE          IVC RV Basal diam:  3.20 cm  IVC diam: 1.90 cm LEFT ATRIUM             Index        RIGHT ATRIUM           Index LA Vol (A2C):   42.8 ml 23.39 ml/m  RA Area:     16.50 cm LA Vol (A4C):   39.0 ml 21.31 ml/m  RA Volume:   45.50 ml  24.86 ml/m LA Biplane Vol: 41.1 ml 22.46 ml/m  AORTIC VALVE                     PULMONIC VALVE AV Area (Vmax):    0.44 cm      PV Vmax:       0.91 m/s AV Area (Vmean):   0.41 cm      PV Peak grad:  3.3 mmHg AV Area (VTI):     0.50 cm AV Vmax:           315.00 cm/s AV Vmean:          226.750 cm/s  AV VTI:            0.616 m AV Peak Grad:      39.7 mmHg AV Mean Grad:      23.8 mmHg LVOT Vmax:         48.93 cm/s LVOT Vmean:        32.800 cm/s LVOT VTI:          0.109 m LVOT/AV VTI ratio: 0.18  AORTA Ao Root diam: 2.70 cm Ao Asc diam:  2.90 cm MITRAL VALVE                TRICUSPID VALVE MV Area (PHT): 4.25 cm     TR Peak grad:   33.2 mmHg MV Decel Time: 179 msec     TR Vmax:        288.00 cm/s MV E velocity: 130.33 cm/s                             SHUNTS  Systemic VTI:  0.11 m                             Systemic Diam: 1.90 cm Dorris Carnes MD Electronically signed by Dorris Carnes MD Signature Date/Time: 08/30/2021/3:35:13 PM    Final      Assessment/Plan: Influenza pneumonia = finish last day of oseltamivir today. And continue to wean steroids  MRSE bacteremia = continue on iv vancomycin. Likely can do short course of 5 days as long as repeat blood cx remain negative. Currently on day 4 of IV vancomycin  Afib = defer to cardiology for management  [facility] for Infectious Diseases Pager: [phone]  09/01/2021, 10:21 AM

## 2021-09-01 NOTE — TOC Initial Note (Signed)
Transition of Care [facility]) - Initial/Assessment Note    Patient Details  Name: Casey Lee MRN: [id number] Date of Birth: 02-26-1928  [facility] CM/SW Contact:    Lennart Pall, LCSW Phone Number: 09/01/2021, 1:47 PM  Clinical Narrative:                 Met with pt today and spoke with son via phone to introduce self/ TOC role and begin dc planning discussions.  Pt very pleasant, talkative and oriented.  He very much hopes to be able to dc directly home and notes that he has a caregiver who  currently assists 3d/ week and can increase hours if needed.  Pt notes that his wife is in the home and has HD 3x/wk (caregiver provides transportation).   Spoke with son, [name], who confirms information provided by pt.  Reports that the caregiver is through [facility] and has varied in hours they have required over the past several months.  They do have the ability to increase hours as much as needed.  Son adds that pt has also gone to SNF following knee surgery and is open to considering that option if therapy feels best option.  At this time, await progress with therapies and recommendations.  Expected Discharge Plan: [place] (vs. possible SNF) Barriers to Discharge: Continued Medical Work up   Patient Goals and CMS Choice Patient states their goals for this hospitalization and ongoing recovery are:: pt is hopeful he will be able to dc home      Expected Discharge Plan and Services Expected Discharge Plan: [place] (vs. possible SNF) In-house Referral: Clinical Social Work     Living arrangements for the past 2 months: Single Family Home                                      Prior Living Arrangements/Services Living arrangements for the past 2 months: Single Family Home Lives with:: Spouse Patient language and need for interpreter reviewed:: Yes Do you feel safe going back to the place where you live?: Yes      Need for  Family Participation in Patient Care: Yes (Comment) Care giver support system in place?: Yes (comment) Current home services: Homehealth aide Criminal Activity/Legal Involvement Pertinent to Current Situation/Hospitalization: No - Comment as needed  Activities of Daily Living Home Assistive Devices/Equipment: Eyeglasses, Cane (specify quad or straight) ADL Screening (condition at time of admission) Patient's cognitive ability adequate to safely complete daily activities?: No Is the patient deaf or have difficulty hearing?: No Does the patient have difficulty seeing, even when wearing glasses/contacts?: Yes (can't get right glasses) Does the patient have difficulty concentrating, remembering, or making decisions?: Yes Patient able to express need for assistance with ADLs?: Yes Does the patient have difficulty dressing or bathing?: Yes Independently performs ADLs?: No Communication: Independent Dressing (OT): Needs assistance Is this a change from baseline?: Change from baseline, expected to last >3 days Grooming: Independent Feeding: Independent Bathing: Needs assistance Is this a change from baseline?: Change from baseline, expected to last >3 days Toileting: Needs assistance Is this a change from baseline?: Change from baseline, expected to last >3days In/Out Bed: Needs assistance Is this a change from baseline?: Change from baseline, expected to last >3 days Walks in Home: Dependent Is this a change from baseline?: Change from baseline, expected to last >3 days  Does the patient have difficulty walking or climbing stairs?: Yes Weakness of Legs: Both Weakness of Arms/Hands: Both  Permission Sought/Granted Permission sought to share information with : Family Supports Permission granted to share information with : Yes, Verbal Permission Granted  Share Information with NAME: Bill Demarest     Permission granted to share info w Relationship: son  Permission granted to share info w  Contact Information: [phone]  Emotional Assessment Appearance:: Appears stated age Attitude/Demeanor/Rapport: Gracious, Engaged Affect (typically observed): Accepting, Hopeful Orientation: : Oriented to Self, Oriented to Place, Oriented to  Time, Oriented to Situation Alcohol / Substance Use: Not Applicable Psych Involvement: No (comment)  Admission diagnosis:  CAP (community acquired pneumonia) [J18.9] Generalized weakness [R53.1] Influenza-like illness [J11.1] Sepsis (HCC) [A41.9] Patient Active Problem List   Diagnosis Date Noted   MRSE bacteremia 08/31/2021   Hypokalemia 08/31/2021   Bacteremia due to Gram-positive bacteria 08/30/2021   Generalized weakness    CAP (community acquired pneumonia) 08/28/2021   Sepsis (HCC) 08/28/2021   Influenza A 08/28/2021   Sepsis due to undetermined organism (HCC) 03/30/2021   PAF (paroxysmal atrial fibrillation) (HCC) 03/30/2021   Right knee pain 05/12/2018   Acute renal failure superimposed on stage 3 chronic kidney disease (HCC) 05/11/2018   Septic prepatellar bursitis of right knee 05/11/2018   Temporal arteritis (HCC)    HLD (hyperlipidemia) 03/19/2016   SDH (subdural hematoma) 01/28/2016   BPPV (benign paroxysmal positional vertigo) 01/28/2016   Cerebral infarction (HCC) - incidental, punctate R periventricular infarct 01/28/2016   Essential hypertension 01/28/2016   Hyperlipidemia LDL goal <70 01/28/2016   Acute encephalopathy    Traumatic hemorrhage of cerebrum with loss of consciousness (HCC) - tiny L caudate 01/24/2016   PCP:  Kohut, Walter, MD Pharmacy:   [id number] - [place], [place] - [address] [place] [address] Phone: [phone] Fax: [phone]     Social Determinants of Health (SDOH) Interventions    Readmission Risk Interventions No flowsheet data found.   

## 2021-09-02 DIAGNOSIS — I480 Paroxysmal atrial fibrillation: Secondary | ICD-10-CM | POA: Diagnosis not present

## 2021-09-02 DIAGNOSIS — J189 Pneumonia, unspecified organism: Secondary | ICD-10-CM

## 2021-09-02 DIAGNOSIS — R0602 Shortness of breath: Secondary | ICD-10-CM | POA: Diagnosis not present

## 2021-09-02 DIAGNOSIS — A419 Sepsis, unspecified organism: Secondary | ICD-10-CM | POA: Diagnosis not present

## 2021-09-02 DIAGNOSIS — R7881 Bacteremia: Secondary | ICD-10-CM | POA: Diagnosis not present

## 2021-09-02 DIAGNOSIS — J101 Influenza due to other identified influenza virus with other respiratory manifestations: Secondary | ICD-10-CM | POA: Diagnosis not present

## 2021-09-02 LAB — CBC WITH DIFFERENTIAL/PLATELET
Abs Immature Granulocytes: 0.11 10*3/uL — ABNORMAL HIGH (ref 0.00–0.07)
Basophils Absolute: 0 10*3/uL (ref 0.0–0.1)
Basophils Relative: 0 %
Eosinophils Absolute: 0 10*3/uL (ref 0.0–0.5)
Eosinophils Relative: 0 %
HCT: 41.1 % (ref 39.0–52.0)
Hemoglobin: 13.7 g/dL (ref 13.0–17.0)
Immature Granulocytes: 1 %
Lymphocytes Relative: 6 %
Lymphs Abs: 0.8 10*3/uL (ref 0.7–4.0)
MCH: 30.1 pg (ref 26.0–34.0)
MCHC: 33.3 g/dL (ref 30.0–36.0)
MCV: 90.3 fL (ref 80.0–100.0)
Monocytes Absolute: 0.8 10*3/uL (ref 0.1–1.0)
Monocytes Relative: 6 %
Neutro Abs: 11.3 10*3/uL — ABNORMAL HIGH (ref 1.7–7.7)
Neutrophils Relative %: 87 %
Platelets: 186 10*3/uL (ref 150–400)
RBC: 4.55 MIL/uL (ref 4.22–5.81)
RDW: 14.7 % (ref 11.5–15.5)
WBC: 13 10*3/uL — ABNORMAL HIGH (ref 4.0–10.5)
nRBC: 0 % (ref 0.0–0.2)

## 2021-09-02 LAB — BASIC METABOLIC PANEL
Anion gap: 10 (ref 5–15)
BUN: 41 mg/dL — ABNORMAL HIGH (ref 8–23)
CO2: 20 mmol/L — ABNORMAL LOW (ref 22–32)
Calcium: 8.2 mg/dL — ABNORMAL LOW (ref 8.9–10.3)
Chloride: 107 mmol/L (ref 98–111)
Creatinine, Ser: 1.61 mg/dL — ABNORMAL HIGH (ref 0.61–1.24)
GFR, Estimated: 40 mL/min — ABNORMAL LOW (ref >60)
Glucose, Bld: 157 mg/dL — ABNORMAL HIGH (ref 70–99)
Potassium: 3.4 mmol/L — ABNORMAL LOW (ref 3.5–5.1)
Sodium: 137 mmol/L (ref 135–145)

## 2021-09-02 MED ORDER — TAMSULOSIN HCL 0.4 MG PO CAPS
0.4000 mg | ORAL_CAPSULE | Freq: Every day | ORAL | Status: DC
  Administered 2021-09-02 – 2021-09-06 (×5): 0.4 mg via ORAL
  Filled 2021-09-02 (×5): qty 1

## 2021-09-02 MED ORDER — POTASSIUM CHLORIDE 20 MEQ PO PACK
40.0000 meq | PACK | Freq: Two times a day (BID) | ORAL | Status: AC
  Administered 2021-09-02 (×2): 40 meq via ORAL
  Filled 2021-09-02 (×2): qty 2

## 2021-09-02 MED ORDER — METHYLPREDNISOLONE SODIUM SUCC 125 MG IJ SOLR
62.5000 mg | Freq: Every day | INTRAMUSCULAR | Status: DC
  Administered 2021-09-02 – 2021-09-03 (×2): 62.5 mg via INTRAVENOUS
  Filled 2021-09-02 (×2): qty 2

## 2021-09-02 MED ORDER — FUROSEMIDE 10 MG/ML IJ SOLN
20.0000 mg | Freq: Once | INTRAMUSCULAR | Status: AC
  Administered 2021-09-02: 20 mg via INTRAVENOUS
  Filled 2021-09-02: qty 2

## 2021-09-02 MED ORDER — POTASSIUM CHLORIDE CRYS ER 20 MEQ PO TBCR: 40.0000 meq | EXTENDED_RELEASE_TABLET | Freq: Two times a day (BID) | ORAL | Status: DC

## 2021-09-02 NOTE — Progress Notes (Signed)
Progress Note  Patient Name: Casey Lee Date of Encounter: 09/02/2021  [facility] Cardiologist: Buford Dresser, MD   Subjective   Breathing is worse this morning. Tolerated change to oral amiodarone well overnight, rates controlled. Spoke with son as well.  Inpatient Medications    Scheduled Meds:  amiodarone  200 mg Oral BID   apixaban  2.5 mg Oral BID   Chlorhexidine Gluconate Cloth  6 each Topical Q0600   dextromethorphan-guaiFENesin  1 tablet Oral BID   fluticasone  2 spray Each Nare Daily   furosemide  20 mg Intravenous Once   levalbuterol  0.63 mg Nebulization BID   loratadine  10 mg Oral Daily   mouth rinse  15 mL Mouth Rinse BID   melatonin  5 mg Oral Once   methylPREDNISolone (SOLU-MEDROL) injection  60 mg Intravenous Q24H   metoprolol succinate  25 mg Oral Daily   oseltamivir  30 mg Oral BID   pantoprazole  40 mg Oral Q0600   potassium chloride  40 mEq Oral BID   rosuvastatin  10 mg Oral Daily   sodium bicarbonate  650 mg Oral BID   tamsulosin  0.4 mg Oral QPC supper   Continuous Infusions:  sodium chloride Stopped (09/01/21 0807)   vancomycin Stopped (09/02/21 0120)   PRN Meds: sodium chloride, acetaminophen **OR** acetaminophen, benzonatate, levalbuterol, QUEtiapine   Vital Signs    Vitals:   09/02/21 0600 09/02/21 0700 09/02/21 0800 09/02/21 0822  BP: 106/84 125/84 130/79 130/79  Pulse: 81 84 83 95  Resp: (!) 27 (!) 28 (!) 34   Temp:   97.6 F (36.4 C)   TempSrc:   Axillary   SpO2: 95% 95% 95%   Weight:      Height:        Intake/Output Summary (Last 24 hours) at 09/02/2021 1054 Last data filed at 09/02/2021 0400 Gross per 24 hour  Intake 416.59 ml  Output 900 ml  Net -483.41 ml   Last 3 Weights 09/02/2021 09/01/2021 08/31/2021  Weight (lbs) 121 lb 4.1 oz 155 lb 13.8 oz 152 lb 1.9 oz  Weight (kg) 55 kg 70.7 kg 69 kg      Telemetry    Atrial fibrillation, rates 80s-90s- Personally Reviewed  ECG    No new since 11/27  - Personally Reviewed  Physical Exam   GEN: More tachypneic today, with audible wheezing. [place] O2 in place NECK: No JVD CARDIAC: irregularly irregular rhythm, normal S1 and S2, no rubs or gallops. 2/6 murmur. VASCULAR: Radial pulses 2+ bilaterally.  RESPIRATORY:  diffuse wheezing, rhonchi ABDOMEN: Soft, non-tender, non-distended MUSCULOSKELETAL:  Moves all 4 limbs independently SKIN: Warm and dry, no edema NEUROLOGIC:  No focal neuro deficits noted. PSYCHIATRIC:  Normal affect    Labs    High Sensitivity Troponin:  No results for input(s): TROPONINIHS in the last 720 hours.   Chemistry Recent Labs  Lab 08/28/21 2143 08/29/21 0225 08/30/21 0257 08/31/21 0239 09/01/21 0250 09/02/21 0236  NA 137 137 138 137 135 137  K 3.9 4.2 3.9 3.3* 3.4* 3.4*  CL 109 107 104 106 107 107  CO2 20* 20* 18* 18* 20* 20*  GLUCOSE 200* 184* 155* 173* 162* 157*  BUN 31* 30* 31* 36* 39* 41*  CREATININE 1.64* 1.48* 1.58* 1.52* 1.54* 1.61*  CALCIUM 8.6* 8.6* 8.9 8.7* 8.2* 8.2*  MG  --   --  1.8 1.8 2.0  --   PROT 6.3* 6.2*  --   --   --   --  ALBUMIN 3.5 3.3*  --   --   --   --   AST 21 33  --   --   --   --   ALT 18 25  --   --   --   --   ALKPHOS 50 53  --   --   --   --   BILITOT 1.0 0.9  --   --   --   --   GFRNONAA 39* 44* 41* 43* 42* 40*  ANIONGAP 8 10 16* 13 8 10     Lipids No results for input(s): CHOL, TRIG, HDL, LABVLDL, LDLCALC, CHOLHDL in the last 168 hours.  Hematology Recent Labs  Lab 08/30/21 0257 08/31/21 0239 09/01/21 0250  WBC 18.8* 15.4* 11.7*  RBC 4.45 4.28 4.31  HGB 13.4 13.0 12.9*  HCT 41.1 40.3 40.7  MCV 92.4 94.2 94.4  MCH 30.1 30.4 29.9  MCHC 32.6 32.3 31.7  RDW 14.4 14.7 14.6  PLT 151 137* 144*   Thyroid  Recent Labs  Lab 08/30/21 0257  TSH 1.414    BNP Recent Labs  Lab 09/01/21 0847  BNP 298.8*    DDimer No results for input(s): DDIMER in the last 168 hours.   Radiology    DG Chest 2 View  Result Date: 09/01/2021 CLINICAL DATA:  Fever,  cough. EXAM: CHEST - 2 VIEW COMPARISON:  August 28, 2021. FINDINGS: Stable cardiomediastinal silhouette. No pneumothorax is noted. Small bilateral pleural effusions are noted with adjacent subsegmental atelectasis or edema. Bony thorax is unremarkable. IMPRESSION: Mild bibasilar subsegmental atelectasis or edema is noted with small bilateral pleural effusions. Electronically Signed   By: Marijo Conception M.D.   On: 09/01/2021 09:47    Cardiac Studies   Echo 08/30/21 1. POor acoustic windows limit study. Laterall hypokinesis (mid/distal), basal inferior hypokinesis.. Left ventricular ejection fraction, by estimation, is 45 to 50%. The left ventricle has mildly decreased function. Left ventricular diastolic parameters are indeterminate.   2. Right ventricular systolic function is normal. The right ventricular size is normal.   3. Mitral regurgitation is eccentric directed along posterior wall of left atrium . Mild to moderate mitral valve regurgitation.   4. AV is thickened, calcified with restricted motion Peak and mean gradients through the valve are 42 and 24 mm Hg respectively. Stroke volume index is 17, suggesting this may be low gradient AS. Dimensionless index is 18, consistent with severe AS. The aortic valve was not well visualized. Aortic valve regurgitation is not visualized.   5. The inferior vena cava is normal in size with <50% respiratory variability, suggesting right atrial pressure of 8 mmHg.  Patient Profile     87 y.o. male with PMH paroxysmal atrial fibrillation, presenting with influenza A and found to have atrial fibrillation with RVR  Assessment & Plan    Atrial fibrillation, paroxysmal vs persistent Cardiomyopathy, unknown if ischemic or nonischemic. EF 45-50% -he cannot detect whether he is in afib, difficult to determine duration -has been on apixaban, dose reduced for age and renal function -continue metoprolol succinate for now, no room to uptitrate based on blood  pressure variability (can drop to upper 65H systolic) -given acute illness, afib with RVR, lack of chest pain will not pursue ischemic evaluation at this time. Recommend medical therapy of cardiomyopathy as tolerated -would recheck echo as an outpatient once rate is stable and patient clinically improved  -supplementing potassium again today, suspect he will need supplementation at least short term on discharge -has  done well overnight on oral amiodarone. -more wheezing and rhonchi today. Team is increasing steroids and giving IV lasix today  Possible aortic stenosis -gradient is moderate, but dimensionless index and stroke volume index suggest possibly severe -murmur does not sound severe -would recheck limited echo once rate slower/respiratory status improved to interrogate aortic valve, check EF. This can be done as an outpatient   Influenza A MRSE bacteremia -treatment per primary team and infectious disease  For questions or updates, please contact [place] Please consult [email or web address] for contact info under     Signed, Buford Dresser, MD  09/02/2021, 10:54 AM

## 2021-09-02 NOTE — Progress Notes (Signed)
INFECTIOUS DISEASE PROGRESS NOTE  ID: Casey Lee is a 87 y.o. male with  Principal Problem:   Sepsis ([facility]) Active Problems:   Essential hypertension   HLD (hyperlipidemia)   Temporal arteritis (HCC)   PAF (paroxysmal atrial fibrillation) (HCC)   CAP (community acquired pneumonia)   Influenza A   Generalized weakness   Bacteremia due to Gram-positive bacteria   MRSE bacteremia   Hypokalemia  Subjective: C/o urinary urgency.   Abtx:  Anti-infectives (From admission, onward)    Start     Dose/Rate Route Frequency Ordered Stop   08/31/21 0100  vancomycin (VANCOREADY) IVPB 750 mg/150 mL        750 mg 150 mL/hr over 60 Minutes Intravenous Every 24 hours 08/30/21 0011     08/30/21 1000  oseltamivir (TAMIFLU) capsule 30 mg        30 mg Oral 2 times daily 08/30/21 0018 09/03/21 0959   08/30/21 0100  vancomycin (VANCOREADY) IVPB 1500 mg/300 mL        1,500 mg 150 mL/hr over 120 Minutes Intravenous  Once 08/30/21 0009 08/30/21 0400   08/29/21 0000  oseltamivir (TAMIFLU) capsule 30 mg  Status:  Discontinued        30 mg Oral Daily 08/28/21 2352 08/30/21 0018   08/28/21 2200  cefTRIAXone (ROCEPHIN) 2 g in sodium chloride 0.9 % 100 mL IVPB  Status:  Discontinued        2 g 200 mL/hr over 30 Minutes Intravenous Every 24 hours 08/28/21 2151 08/30/21 1356   08/28/21 2200  azithromycin (ZITHROMAX) 500 mg in sodium chloride 0.9 % 250 mL IVPB  Status:  Discontinued        500 mg 250 mL/hr over 60 Minutes Intravenous Every 24 hours 08/28/21 2151 08/30/21 1356       Medications: Scheduled:  amiodarone  200 mg Oral BID   apixaban  2.5 mg Oral BID   Chlorhexidine Gluconate Cloth  6 each Topical Q0600   dextromethorphan-guaiFENesin  1 tablet Oral BID   fluticasone  2 spray Each Nare Daily   levalbuterol  0.63 mg Nebulization BID   loratadine  10 mg Oral Daily   mouth rinse  15 mL Mouth Rinse BID   melatonin  5 mg Oral Once   methylPREDNISolone (SOLU-MEDROL) injection  62.5 mg  Intravenous Daily   metoprolol succinate  25 mg Oral Daily   oseltamivir  30 mg Oral BID   pantoprazole  40 mg Oral Q0600   potassium chloride  40 mEq Oral BID   rosuvastatin  10 mg Oral Daily   sodium bicarbonate  650 mg Oral BID   tamsulosin  0.4 mg Oral QPC supper    Objective: Vital signs in last 24 hours: Temp:  [97.6 F (36.4 C)-98.2 F (36.8 C)] 97.6 F (36.4 C) (12/01 0800) Pulse Rate:  [70-106] 95 (12/01 0822) Resp:  [23-37] 34 (12/01 0800) BP: (96-144)/(57-118) 130/79 (12/01 0822) SpO2:  [94 %-97 %] 95 % (12/01 0800) Weight:  [55 kg] 55 kg (12/01 0400)   General appearance: alert and mild distress Resp: diminished breath sounds anterior - bilateral and rhonchi anterior - bilateral Cardio: regular rate and rhythm GI: normal findings: bowel sounds normal and soft, non-tender Extremities: edema trace  Lab Results Recent Labs    08/31/21 0239 09/01/21 0250 09/02/21 0236  WBC 15.4* 11.7*  --   HGB 13.0 12.9*  --   HCT 40.3 40.7  --   NA 137 135 137  K  3.3* 3.4* 3.4*  CL 106 107 107  CO2 18* 20* 20*  BUN 36* 39* 41*  CREATININE 1.52* 1.54* 1.61*   Liver Panel No results for input(s): PROT, ALBUMIN, AST, ALT, ALKPHOS, BILITOT, BILIDIR, IBILI in the last 72 hours. Sedimentation Rate No results for input(s): ESRSEDRATE in the last 72 hours. C-Reactive Protein No results for input(s): CRP in the last 72 hours.  Microbiology: Recent Results (from the past 240 hour(s))  Resp Panel by RT-PCR (Flu A&B, Covid) Nasopharyngeal Swab     Status: Abnormal   Collection Time: 08/28/21  9:32 PM   Specimen: Nasopharyngeal Swab; Nasopharyngeal(NP) swabs in vial transport medium  Result Value Ref Range Status   SARS Coronavirus 2 by RT PCR NEGATIVE NEGATIVE Final    Comment: (NOTE) SARS-CoV-2 target nucleic acids are NOT DETECTED.  The SARS-CoV-2 RNA is generally detectable in upper respiratory specimens during the acute phase of infection. The lowest concentration of  SARS-CoV-2 viral copies this assay can detect is 138 copies/mL. A negative result does not preclude SARS-Cov-2 infection and should not be used as the sole basis for treatment or other patient management decisions. A negative result may occur with  improper specimen collection/handling, submission of specimen other than nasopharyngeal swab, presence of viral mutation(s) within the areas targeted by this assay, and inadequate number of viral copies(<138 copies/mL). A negative result must be combined with clinical observations, patient history, and epidemiological information. The expected result is Negative.  Fact Sheet for Patients:  EntrepreneurPulse.com.au  Fact Sheet for Healthcare Providers:  IncredibleEmployment.be  This test is no t yet approved or cleared by the [place] FDA and  has been authorized for detection and/or diagnosis of SARS-CoV-2 by FDA under an Emergency Use Authorization (EUA). This EUA will remain  in effect (meaning this test can be used) for the duration of the COVID-19 declaration under Section 564(b)(1) of the Act, 21 U.S.C.section 360bbb-3(b)(1), unless the authorization is terminated  or revoked sooner.       Influenza A by PCR POSITIVE (A) NEGATIVE Final   Influenza B by PCR NEGATIVE NEGATIVE Final    Comment: (NOTE) The Xpert Xpress SARS-CoV-2/FLU/RSV plus assay is intended as an aid in the diagnosis of influenza from Nasopharyngeal swab specimens and should not be used as a sole basis for treatment. Nasal washings and aspirates are unacceptable for Xpert Xpress SARS-CoV-2/FLU/RSV testing.  Fact Sheet for Patients: EntrepreneurPulse.com.au  Fact Sheet for Healthcare Providers: IncredibleEmployment.be  This test is not yet approved or cleared by the [place] FDA and has been authorized for detection and/or diagnosis of SARS-CoV-2 by FDA under an Emergency Use  Authorization (EUA). This EUA will remain in effect (meaning this test can be used) for the duration of the COVID-19 declaration under Section 564(b)(1) of the Act, 21 U.S.C. section 360bbb-3(b)(1), unless the authorization is terminated or revoked.  Performed at [facility], [place] [address], [place], [place] [address]   Blood Culture (routine x 2)     Status: Abnormal   Collection Time: 08/28/21  9:38 PM   Specimen: BLOOD  Result Value Ref Range Status   Specimen Description   Final    BLOOD LEFT ANTECUBITAL Performed at [facility] [address] [place], [place] [address]    Special Requests   Final    BOTTLES DRAWN AEROBIC AND ANAEROBIC Blood Culture results may not be optimal due to an excessive volume of blood received in culture bottles Performed at [facility], [place] [address], [place], [place] [address]    Culture  Setup Time   Final    GRAM POSITIVE COCCI IN CLUSTERS ANAEROBIC BOTTLE ONLY CRITICAL RESULT CALLED TO, READ BACK BY AND VERIFIED WITH: Ramon Dredge [id number] FCP Performed at [facility], [place] [address] [place], [place] [address]    Culture STAPHYLOCOCCUS EPIDERMIDIS (A)  Final   Report Status 08/31/2021 FINAL  Final   Organism ID, Bacteria STAPHYLOCOCCUS EPIDERMIDIS  Final      Susceptibility   Staphylococcus epidermidis - MIC*    CIPROFLOXACIN 4 RESISTANT Resistant     ERYTHROMYCIN <=0.25 SENSITIVE Sensitive     GENTAMICIN <=0.5 SENSITIVE Sensitive     OXACILLIN >=4 RESISTANT Resistant     TETRACYCLINE <=1 SENSITIVE Sensitive     VANCOMYCIN 1 SENSITIVE Sensitive     TRIMETH/SULFA <=10 SENSITIVE Sensitive     CLINDAMYCIN <=0.25 SENSITIVE Sensitive     RIFAMPIN <=0.5 SENSITIVE Sensitive     Inducible Clindamycin NEGATIVE Sensitive     * STAPHYLOCOCCUS EPIDERMIDIS  Blood Culture (routine x 2)     Status: Abnormal   Collection Time: 08/28/21  9:38 PM   Specimen: BLOOD  Result Value  Ref Range Status   Specimen Description   Final    BLOOD RIGHT ANTECUBITAL Performed at [facility] [address], [place], [place] [address]    Special Requests   Final    BOTTLES DRAWN AEROBIC AND ANAEROBIC Blood Culture results may not be optimal due to an excessive volume of blood received in culture bottles Performed at [facility] [address], [place], [place] [address]    Culture  Setup Time   Final    GRAM POSITIVE COCCI IN CLUSTERS IN BOTH AEROBIC AND ANAEROBIC BOTTLES CRITICAL RESULT CALLED TO, READ BACK BY AND VERIFIED WITH: PHARMD LEANNE P [id number] FCP    Culture (A)  Final    STAPHYLOCOCCUS EPIDERMIDIS SUSCEPTIBILITIES PERFORMED ON PREVIOUS CULTURE WITHIN THE LAST 5 DAYS. Performed at [facility], [place] [address], [place], [place] [address]    Report Status 08/31/2021 FINAL  Final  Blood Culture ID Panel (Reflexed)     Status: Abnormal   Collection Time: 08/28/21  9:38 PM  Result Value Ref Range Status   Enterococcus faecalis NOT DETECTED NOT DETECTED Final   Enterococcus Faecium NOT DETECTED NOT DETECTED Final   Listeria monocytogenes NOT DETECTED NOT DETECTED Final   Staphylococcus species DETECTED (A) NOT DETECTED Final    Comment: CRITICAL RESULT CALLED TO, READ BACK BY AND VERIFIED WITH: PHARMD LEANNE P [id number] FCP    Staphylococcus aureus (BCID) NOT DETECTED NOT DETECTED Final   Staphylococcus epidermidis DETECTED (A) NOT DETECTED Final    Comment: Methicillin (oxacillin) resistant coagulase negative staphylococcus. Possible blood culture contaminant (unless isolated from more than one blood culture draw or clinical case suggests pathogenicity). No antibiotic treatment is indicated for blood  culture contaminants. CRITICAL RESULT CALLED TO, READ BACK BY AND VERIFIED WITH: PHARMD LEANNE P [phone] [id number] FCP    Staphylococcus lugdunensis NOT DETECTED NOT DETECTED Final   Streptococcus species NOT DETECTED  NOT DETECTED Final   Streptococcus agalactiae NOT DETECTED NOT DETECTED Final   Streptococcus pneumoniae NOT DETECTED NOT DETECTED Final   Streptococcus pyogenes NOT DETECTED NOT DETECTED Final   A.calcoaceticus-baumannii NOT DETECTED NOT DETECTED Final   Bacteroides fragilis NOT DETECTED NOT DETECTED Final   Enterobacterales NOT DETECTED NOT DETECTED Final  Enterobacter cloacae complex NOT DETECTED NOT DETECTED Final   Escherichia coli NOT DETECTED NOT DETECTED Final   Klebsiella aerogenes NOT DETECTED NOT DETECTED Final   Klebsiella oxytoca NOT DETECTED NOT DETECTED Final   Klebsiella pneumoniae NOT DETECTED NOT DETECTED Final   Proteus species NOT DETECTED NOT DETECTED Final   Salmonella species NOT DETECTED NOT DETECTED Final   Serratia marcescens NOT DETECTED NOT DETECTED Final   Haemophilus influenzae NOT DETECTED NOT DETECTED Final   Neisseria meningitidis NOT DETECTED NOT DETECTED Final   Pseudomonas aeruginosa NOT DETECTED NOT DETECTED Final   Stenotrophomonas maltophilia NOT DETECTED NOT DETECTED Final   Candida albicans NOT DETECTED NOT DETECTED Final   Candida auris NOT DETECTED NOT DETECTED Final   Candida glabrata NOT DETECTED NOT DETECTED Final   Candida krusei NOT DETECTED NOT DETECTED Final   Candida parapsilosis NOT DETECTED NOT DETECTED Final   Candida tropicalis NOT DETECTED NOT DETECTED Final   Cryptococcus neoformans/gattii NOT DETECTED NOT DETECTED Final   Methicillin resistance mecA/C DETECTED (A) NOT DETECTED Final    Comment: CRITICAL RESULT CALLED TO, READ BACK BY AND VERIFIED WITH: Ramon Dredge [phone] FCP Performed at [facility], [address] [place], [place] [address]   MRSA Next Gen by PCR, Nasal     Status: None   Collection Time: 08/29/21  2:08 AM   Specimen: Nasal Mucosa; Nasal Swab  Result Value Ref Range Status   MRSA by PCR Next Gen NOT DETECTED NOT DETECTED Final    Comment: (NOTE) The GeneXpert MRSA Assay (FDA approved for  NASAL specimens only), is one component of a comprehensive MRSA colonization surveillance program. It is not intended to diagnose MRSA infection nor to guide or monitor treatment for MRSA infections. Test performance is not FDA approved in patients less than 84 years old. Performed at [facility], [place] [address] [place], [place] [address]   Urine Culture     Status: None   Collection Time: 08/29/21  1:20 PM   Specimen: Urine, Random  Result Value Ref Range Status   Specimen Description   Final    URINE, RANDOM Performed at [facility] [address] [place], [place] [address]    Special Requests   Final    NONE Performed at [facility], [place] [address] [place], [place] [address]    Culture   Final    NO GROWTH Performed at [facility], [place] [address] [place], [place] [address]    Report Status 08/30/2021 FINAL  Final  Culture, blood (Routine X 2) w Reflex to ID Panel     Status: None (Preliminary result)   Collection Time: 08/31/21  2:55 PM   Specimen: Left Antecubital; Blood  Result Value Ref Range Status   Specimen Description   Final    LEFT ANTECUBITAL Performed at [facility] [address], [place], [place] [address]    Special Requests   Final    BOTTLES DRAWN AEROBIC ONLY Blood Culture adequate volume Performed at [facility] [address] [place], [place] [address]    Culture   Final    NO GROWTH 2 DAYS Performed at [facility] [address], [place], [place] [address]    Report Status PENDING  Incomplete  Culture, blood (Routine X 2) w Reflex to ID Panel     Status: None (Preliminary result)   Collection Time: 08/31/21  3:41 PM  Specimen: BLOOD LEFT FOREARM  Result Value Ref Range Status   Specimen Description   Final    BLOOD LEFT FOREARM Performed at [facility] [address], [place], [place] [address]     Special Requests   Final    BOTTLES DRAWN AEROBIC ONLY Blood Culture results may not be optimal due to an inadequate volume of blood received in culture bottles Performed at [facility] [address] [place], [place] [address]    Culture   Final    NO GROWTH 2 DAYS Performed at [facility] [address], [place], [place] [address]    Report Status PENDING  Incomplete    Studies/Results: DG Chest 2 View  Result Date: 09/01/2021 CLINICAL DATA:  Fever, cough. EXAM: CHEST - 2 VIEW COMPARISON:  August 28, 2021. FINDINGS: Stable cardiomediastinal silhouette. No pneumothorax is noted. Small bilateral pleural effusions are noted with adjacent subsegmental atelectasis or edema. Bony thorax is unremarkable. IMPRESSION: Mild bibasilar subsegmental atelectasis or edema is noted with small bilateral pleural effusions. Electronically Signed   By: Marijo Conception M.D.   On: 09/01/2021 09:47     Assessment/Plan: Influenza PNA MRSE bacteremia CKD 2  Total days of antibiotics: 5 vanco  Cr roughly at baseline.  Repeat BCx are ngtd at day 2.  Will aim to d/c anbx soon (12-4?) provided repeat BCx remain (-)         Bobby Rumpf MD, FACP Infectious Diseases (pager) [phone] [email or web address] 09/02/2021, 11:29 AM  LOS: 5 days

## 2021-09-02 NOTE — Progress Notes (Signed)
Pharmacy Antibiotic Note  Casey Lee is a 87 y.o. male admitted on 08/28/2021 with sepsis due to pneumonia.  Patient also +Influenza A and started on Tamiflu.  Pharmacy has been consulted for Vancomycin dosing for blood culture (2 of 4) + methicillin resistant staph epidermidis.  Plan: Continue vancomycin 750 mg IV Q 24 hrs.   Plan for shorter course of vancomycin for bacteremia  Patient on Tamiflu 30mg  daily x 5 days   F/u cultures Follow renal function   Height: 5\' 9"  (175.3 cm) Weight: 55 kg (121 lb 4.1 oz) IBW/kg (Calculated) : 70.7  Temp (24hrs), Avg:97.8 F (36.6 C), Min:97.2 F (36.2 C), Max:98.2 F (36.8 C)  Recent Labs  Lab 08/28/21 2143 08/29/21 0139 08/29/21 0225 08/30/21 0257 08/31/21 0239 09/01/21 0250 09/02/21 0236 09/02/21 1156  WBC 9.4  --  9.1 18.8* 15.4* 11.7*  --  13.0*  CREATININE 1.64*  --  1.48* 1.58* 1.52* 1.54* 1.61*  --   LATICACIDVEN 2.3* 2.6* 1.8  --   --   --   --   --      Estimated Creatinine Clearance: 23.2 mL/min (A) (by C-G formula based on SCr of 1.61 mg/dL (H)).    Allergies  Allergen Reactions   Fosamax [Alendronate] Other (See Comments)    SEVERE JAW(BONE) PAIN   Clopidogrel     Other reaction(s): Other (See Comments)    Antimicrobials this admission: 11/26 Ceftriaxone >>  11/28 11/26 Azithromycin >>  11/28  11/27 Tamiflu x 5 days 11/28 Vancomycin >>  Dose adjustments this admission:    Microbiology results: 11/27 MRSA - negative 11/27 UCx : ng-final 11/26 BCx: 3/4 bottles MRSE - Cipro resistant 11/26 flu A + 11/29 Plan rpt BCx: NGTD  Thank you for allowing pharmacy to be a part of this patient's care.  Royetta Asal, PharmD, BCPS 09/02/2021 2:29 PM

## 2021-09-02 NOTE — Progress Notes (Signed)
PROGRESS NOTE    Casey Lee  CBJ:[id number] DOB: 03-06-1928 DOA: 08/28/2021 PCP: [name], MD   Chief Complaint  Patient presents with   Fever   Weakness    Brief Narrative:  87 year old gentleman with prior history of temporal arteritis on prednisone, stage IIIa CKD, hyperlipidemia, atrial fibrillation presents with cough fevers and chills.  Patient was found to to have influenza A .  He ws started on steroids, tamiflu. Hospital course complicated by staph epidermidis bacteremia. Pt currently on IV vancomycin and ID on board.    Assessment & Plan:   Principal Problem:   Sepsis ([place]) Active Problems:   Essential hypertension   HLD (hyperlipidemia)   Temporal arteritis (HCC)   PAF (paroxysmal atrial fibrillation) (HCC)   CAP (community acquired pneumonia)   Influenza A   Generalized weakness   Bacteremia due to Gram-positive bacteria   MRSE bacteremia   Hypokalemia   Sepsis secondary to pneumonia/staph epidermidis bacteremia/influenza A infection Improving sepsis physiology. Patient currently afebrile and leukocytosis is present.  Initial blood cultures were positive for staph epidermidis, repeat blood cultures have been negative so far ID consulted, recommendations for the duration of antibiotics  given.  2D echocardiogram is negative for vegetations.  Influenza A positive infection/acute respiratory failure with hypoxia probably secondary to influenza A infection Continue the course of Tamiflu and continue with symptomatic management with bronchodilators Flonase PPI. Transition from solucortef to solumedrol today as he continues to have wheezing.  A dose of lasix ordered today for congestion.  Patient continues to be tachypneic and coughing on minimal exertion.    Atrial fibrillation with RVR probably secondary to acute infection TSH within normal limits Echocardiogram shows left ventricular ejection fraction of 45 to 50%, with lateral hypokinesis,  basal inferior wall hypokinesis.  Patient transitioned from IV amiodarone to oral amiodarone 200 mg twice daily and Toprol. Patient is on Eliquis for anticoagulation.     History of temporal arteritis Was on prednisone , currently on Solu-Medrol.  Hyperlipidemia Continue with statin   Acute metabolic encephalopathy Probably secondary to sepsis, pneumonia, influenza infection. Patient currently is alert and oriented and answering questions appropriately    History of chronic thrombocytopenia Continue to monitor.   Stage IIIa CKD Continue to monitor. Creatinine remained stable around 1.6.    Hypokalemia Replaced.  Recheck potassium in the morning,       DVT prophylaxis: Eliquis Code Status: Full code Family Communication: Family at bedside discussed the plan with the son at bedside.    Disposition:   Status is: Inpatient  Remains inpatient appropriate because: IV amiodarone       Consultants:  Cardiology Infectious disease Procedures: Echocardiogram Antimicrobials:  Antibiotics Given (last 72 hours)     Date/Time Action Medication Dose Rate   08/30/21 2248 Given   oseltamivir (TAMIFLU) capsule 30 mg 30 mg    08/31/21 0009 New Bag/Given   vancomycin (VANCOREADY) IVPB 750 mg/150 mL 750 mg 150 mL/hr   08/31/21 1023 Given   oseltamivir (TAMIFLU) capsule 30 mg 30 mg    08/31/21 2251 Given   oseltamivir (TAMIFLU) capsule 30 mg 30 mg    09/01/21 0138 New Bag/Given   vancomycin (VANCOREADY) IVPB 750 mg/150 mL 750 mg 150 mL/hr   09/01/21 1021 Given   oseltamivir (TAMIFLU) capsule 30 mg 30 mg    09/01/21 2123 Given   oseltamivir (TAMIFLU) capsule 30 mg 30 mg    09/02/21 0039 New Bag/Given   vancomycin (VANCOREADY) IVPB 750 mg/150 mL 750  mg 150 mL/hr   09/02/21 5809 Given   oseltamivir (TAMIFLU) capsule 30 mg 30 mg         Subjective: Patient reports he is still very dyspneic on minimal exertion and continues to cough. No chest pain at this  time. Objective: Vitals:   09/02/21 0700 09/02/21 0800 09/02/21 0822 09/02/21 1148  BP: 125/84 130/79 130/79   Pulse: 84 83 95   Resp: (!) 28 (!) 34    Temp:  97.6 F (36.4 C)  (!) 97.2 F (36.2 C)  TempSrc:  Axillary  Oral  SpO2: 95% 95%    Weight:      Height:        Intake/Output Summary (Last 24 hours) at 09/02/2021 1226 Last data filed at 09/02/2021 0400 Gross per 24 hour  Intake 384.11 ml  Output 900 ml  Net -515.89 ml    Filed Weights   08/31/21 0500 09/01/21 0500 09/02/21 0400  Weight: 69 kg 70.7 kg 55 kg    Examination:  General exam: Elderly gentleman, appears deconditioned and ill appearing on 2 L of nasal cannula oxygen. Respiratory system: Basilar crackles, posteriorly wheezing present, tachypnea present on talking. Cardiovascular system: S1 & S2 heard, irregularly irregular no pedal edema. Gastrointestinal system: Abdomen is nondistended, soft and nontender.  Normal bowel sounds heard. Central nervous system: Alert and oriented. No focal neurological deficits. Extremities: Symmetric 5 x 5 power. Skin: No rashes, lesions or ulcers Psychiatry:  Mood & affect appropriate.     Data Reviewed: I have personally reviewed following labs and imaging studies  CBC: Recent Labs  Lab 08/29/21 0225 08/30/21 0257 08/31/21 0239 09/01/21 0250 09/02/21 1156  WBC 9.1 18.8* 15.4* 11.7* 13.0*  NEUTROABS 7.7 16.7* 13.6* 9.9* 11.3*  HGB 13.7 13.4 13.0 12.9* 13.7  HCT 41.9 41.1 40.3 40.7 41.1  MCV 93.7 92.4 94.2 94.4 90.3  PLT 144* 151 137* 144* 186     Basic Metabolic Panel: Recent Labs  Lab 08/29/21 0225 08/30/21 0257 08/31/21 0239 09/01/21 0250 09/02/21 0236  NA 137 138 137 135 137  K 4.2 3.9 3.3* 3.4* 3.4*  CL 107 104 106 107 107  CO2 20* 18* 18* 20* 20*  GLUCOSE 184* 155* 173* 162* 157*  BUN 30* 31* 36* 39* 41*  CREATININE 1.48* 1.58* 1.52* 1.54* 1.61*  CALCIUM 8.6* 8.9 8.7* 8.2* 8.2*  MG  --  1.8 1.8 2.0  --      GFR: Estimated Creatinine  Clearance: 23.2 mL/min (A) (by C-G formula based on SCr of 1.61 mg/dL (H)).  Liver Function Tests: Recent Labs  Lab 08/28/21 2143 08/29/21 0225  AST 21 33  ALT 18 25  ALKPHOS 50 53  BILITOT 1.0 0.9  PROT 6.3* 6.2*  ALBUMIN 3.5 3.3*     CBG: No results for input(s): GLUCAP in the last 168 hours.   Recent Results (from the past 240 hour(s))  Resp Panel by RT-PCR (Flu A&B, Covid) Nasopharyngeal Swab     Status: Abnormal   Collection Time: 08/28/21  9:32 PM   Specimen: Nasopharyngeal Swab; Nasopharyngeal(NP) swabs in vial transport medium  Result Value Ref Range Status   SARS Coronavirus 2 by RT PCR NEGATIVE NEGATIVE Final    Comment: (NOTE) SARS-CoV-2 target nucleic acids are NOT DETECTED.  The SARS-CoV-2 RNA is generally detectable in upper respiratory specimens during the acute phase of infection. The lowest concentration of SARS-CoV-2 viral copies this assay can detect is 138 copies/mL. A negative result does not preclude SARS-Cov-2 infection  and should not be used as the sole basis for treatment or other patient management decisions. A negative result may occur with  improper specimen collection/handling, submission of specimen other than nasopharyngeal swab, presence of viral mutation(s) within the areas targeted by this assay, and inadequate number of viral copies(<138 copies/mL). A negative result must be combined with clinical observations, patient history, and epidemiological information. The expected result is Negative.  Fact Sheet for Patients:  EntrepreneurPulse.com.au  Fact Sheet for Healthcare Providers:  IncredibleEmployment.be  This test is no t yet approved or cleared by the [place] FDA and  has been authorized for detection and/or diagnosis of SARS-CoV-2 by FDA under an Emergency Use Authorization (EUA). This EUA will remain  in effect (meaning this test can be used) for the duration of the COVID-19  declaration under Section 564(b)(1) of the Act, 21 U.S.C.section 360bbb-3(b)(1), unless the authorization is terminated  or revoked sooner.       Influenza A by PCR POSITIVE (A) NEGATIVE Final   Influenza B by PCR NEGATIVE NEGATIVE Final    Comment: (NOTE) The Xpert Xpress SARS-CoV-2/FLU/RSV plus assay is intended as an aid in the diagnosis of influenza from Nasopharyngeal swab specimens and should not be used as a sole basis for treatment. Nasal washings and aspirates are unacceptable for Xpert Xpress SARS-CoV-2/FLU/RSV testing.  Fact Sheet for Patients: EntrepreneurPulse.com.au  Fact Sheet for Healthcare Providers: IncredibleEmployment.be  This test is not yet approved or cleared by the [place] FDA and has been authorized for detection and/or diagnosis of SARS-CoV-2 by FDA under an Emergency Use Authorization (EUA). This EUA will remain in effect (meaning this test can be used) for the duration of the COVID-19 declaration under Section 564(b)(1) of the Act, 21 U.S.C. section 360bbb-3(b)(1), unless the authorization is terminated or revoked.  Performed at [facility], [place] [address] [place], [place] [address]   Blood Culture (routine x 2)     Status: Abnormal   Collection Time: 08/28/21  9:38 PM   Specimen: BLOOD  Result Value Ref Range Status   Specimen Description   Final    BLOOD LEFT ANTECUBITAL Performed at [facility] [address] [place], [place] [address]    Special Requests   Final    BOTTLES DRAWN AEROBIC AND ANAEROBIC Blood Culture results may not be optimal due to an excessive volume of blood received in culture bottles Performed at [facility] [address], [place], [place] [address]    Culture  Setup Time   Final    GRAM POSITIVE COCCI IN CLUSTERS ANAEROBIC BOTTLE ONLY CRITICAL RESULT CALLED TO, READ BACK BY AND VERIFIED WITH: Ramon Dredge  [id number] FCP Performed at [facility], [place] [address] [place], [place] [address]    Culture STAPHYLOCOCCUS EPIDERMIDIS (A)  Final   Report Status 08/31/2021 FINAL  Final   Organism ID, Bacteria STAPHYLOCOCCUS EPIDERMIDIS  Final      Susceptibility   Staphylococcus epidermidis - MIC*    CIPROFLOXACIN 4 RESISTANT Resistant     ERYTHROMYCIN <=0.25 SENSITIVE Sensitive     GENTAMICIN <=0.5 SENSITIVE Sensitive     OXACILLIN >=4 RESISTANT Resistant     TETRACYCLINE <=1 SENSITIVE Sensitive     VANCOMYCIN 1 SENSITIVE Sensitive     TRIMETH/SULFA <=10 SENSITIVE Sensitive     CLINDAMYCIN <=0.25 SENSITIVE Sensitive     RIFAMPIN <=0.5 SENSITIVE Sensitive     Inducible Clindamycin NEGATIVE Sensitive     *  STAPHYLOCOCCUS EPIDERMIDIS  Blood Culture (routine x 2)     Status: Abnormal   Collection Time: 08/28/21  9:38 PM   Specimen: BLOOD  Result Value Ref Range Status   Specimen Description   Final    BLOOD RIGHT ANTECUBITAL Performed at [facility] [address] [place], [place] [address]    Special Requests   Final    BOTTLES DRAWN AEROBIC AND ANAEROBIC Blood Culture results may not be optimal due to an excessive volume of blood received in culture bottles Performed at [facility] [address], [place], [place] [address]    Culture  Setup Time   Final    GRAM POSITIVE COCCI IN CLUSTERS IN BOTH AEROBIC AND ANAEROBIC BOTTLES CRITICAL RESULT CALLED TO, READ BACK BY AND VERIFIED WITH: PHARMD LEANNE P [id number] FCP    Culture (A)  Final    STAPHYLOCOCCUS EPIDERMIDIS SUSCEPTIBILITIES PERFORMED ON PREVIOUS CULTURE WITHIN THE LAST 5 DAYS. Performed at [facility], [place] [address] [place], [place] [address]    Report Status 08/31/2021 FINAL  Final  Blood Culture ID Panel (Reflexed)     Status: Abnormal   Collection Time: 08/28/21  9:38 PM  Result Value Ref Range Status   Enterococcus faecalis NOT DETECTED NOT DETECTED Final    Enterococcus Faecium NOT DETECTED NOT DETECTED Final   Listeria monocytogenes NOT DETECTED NOT DETECTED Final   Staphylococcus species DETECTED (A) NOT DETECTED Final    Comment: CRITICAL RESULT CALLED TO, READ BACK BY AND VERIFIED WITH: PHARMD LEANNE P [id number] FCP    Staphylococcus aureus (BCID) NOT DETECTED NOT DETECTED Final   Staphylococcus epidermidis DETECTED (A) NOT DETECTED Final    Comment: Methicillin (oxacillin) resistant coagulase negative staphylococcus. Possible blood culture contaminant (unless isolated from more than one blood culture draw or clinical case suggests pathogenicity). No antibiotic treatment is indicated for blood  culture contaminants. CRITICAL RESULT CALLED TO, READ BACK BY AND VERIFIED WITH: PHARMD LEANNE P [id number] [id number] FCP    Staphylococcus lugdunensis NOT DETECTED NOT DETECTED Final   Streptococcus species NOT DETECTED NOT DETECTED Final   Streptococcus agalactiae NOT DETECTED NOT DETECTED Final   Streptococcus pneumoniae NOT DETECTED NOT DETECTED Final   Streptococcus pyogenes NOT DETECTED NOT DETECTED Final   A.calcoaceticus-baumannii NOT DETECTED NOT DETECTED Final   Bacteroides fragilis NOT DETECTED NOT DETECTED Final   Enterobacterales NOT DETECTED NOT DETECTED Final   Enterobacter cloacae complex NOT DETECTED NOT DETECTED Final   Escherichia coli NOT DETECTED NOT DETECTED Final   Klebsiella aerogenes NOT DETECTED NOT DETECTED Final   Klebsiella oxytoca NOT DETECTED NOT DETECTED Final   Klebsiella pneumoniae NOT DETECTED NOT DETECTED Final   Proteus species NOT DETECTED NOT DETECTED Final   Salmonella species NOT DETECTED NOT DETECTED Final   Serratia marcescens NOT DETECTED NOT DETECTED Final   Haemophilus influenzae NOT DETECTED NOT DETECTED Final   Neisseria meningitidis NOT DETECTED NOT DETECTED Final   Pseudomonas aeruginosa NOT DETECTED NOT DETECTED Final   Stenotrophomonas maltophilia NOT DETECTED NOT DETECTED Final   Candida albicans  NOT DETECTED NOT DETECTED Final   Candida auris NOT DETECTED NOT DETECTED Final   Candida glabrata NOT DETECTED NOT DETECTED Final   Candida krusei NOT DETECTED NOT DETECTED Final   Candida parapsilosis NOT DETECTED NOT DETECTED Final   Candida tropicalis NOT DETECTED NOT DETECTED Final   Cryptococcus neoformans/gattii NOT DETECTED NOT DETECTED Final   Methicillin resistance mecA/C DETECTED (A) NOT DETECTED Final  Comment: CRITICAL RESULT CALLED TO, READ BACK BY AND VERIFIED WITH: Ramon Dredge [id number] FCP Performed at [facility], [place] [address] [place], [place] [address]   MRSA Next Gen by PCR, Nasal     Status: None   Collection Time: 08/29/21  2:08 AM   Specimen: Nasal Mucosa; Nasal Swab  Result Value Ref Range Status   MRSA by PCR Next Gen NOT DETECTED NOT DETECTED Final    Comment: (NOTE) The GeneXpert MRSA Assay (FDA approved for NASAL specimens only), is one component of a comprehensive MRSA colonization surveillance program. It is not intended to diagnose MRSA infection nor to guide or monitor treatment for MRSA infections. Test performance is not FDA approved in patients less than 56 years old. Performed at [facility], [place] [address] [place], [place] [address]   Urine Culture     Status: None   Collection Time: 08/29/21  1:20 PM   Specimen: Urine, Random  Result Value Ref Range Status   Specimen Description   Final    URINE, RANDOM Performed at [facility] [address], [place], [place] [address]    Special Requests   Final    NONE Performed at [facility], [place] [address], [place], [place] [address]    Culture   Final    NO GROWTH Performed at [facility], [place] [address], [place], [place] [address]    Report Status 08/30/2021 FINAL  Final  Culture, blood (Routine X 2) w Reflex to ID Panel     Status: None (Preliminary result)   Collection Time: 08/31/21  2:55 PM   Specimen:  Left Antecubital; Blood  Result Value Ref Range Status   Specimen Description   Final    LEFT ANTECUBITAL Performed at [facility] [address], [place], [place] [address]    Special Requests   Final    BOTTLES DRAWN AEROBIC ONLY Blood Culture adequate volume Performed at [facility] [address], [place], [place] [address]    Culture   Final    NO GROWTH 2 DAYS Performed at [facility] [address] [place], [place] [address]    Report Status PENDING  Incomplete  Culture, blood (Routine X 2) w Reflex to ID Panel     Status: None (Preliminary result)   Collection Time: 08/31/21  3:41 PM   Specimen: BLOOD LEFT FOREARM  Result Value Ref Range Status   Specimen Description   Final    BLOOD LEFT FOREARM Performed at [facility] [address] [place], [place] [address]    Special Requests   Final    BOTTLES DRAWN AEROBIC ONLY Blood Culture results may not be optimal due to an inadequate volume of blood received in culture bottles Performed at [facility] [address], [place], [place] [address]    Culture   Final    NO GROWTH 2 DAYS Performed at [facility] [address], [place], [place] [address]    Report Status PENDING  Incomplete          Radiology Studies: DG Chest 2 View  Result Date: 09/01/2021 CLINICAL DATA:  Fever, cough. EXAM: CHEST - 2 VIEW COMPARISON:  August 28, 2021. FINDINGS: Stable cardiomediastinal silhouette. No pneumothorax is noted. Small bilateral pleural effusions are noted with adjacent subsegmental atelectasis or edema. Bony thorax is unremarkable. IMPRESSION: Mild bibasilar subsegmental atelectasis or  edema is noted with small bilateral pleural effusions. Electronically Signed   By: Marijo Conception M.D.   On: 09/01/2021 09:47        Scheduled Meds:  amiodarone  200 mg Oral BID   apixaban  2.5 mg Oral BID   Chlorhexidine Gluconate  Cloth  6 each Topical Q0600   dextromethorphan-guaiFENesin  1 tablet Oral BID   fluticasone  2 spray Each Nare Daily   levalbuterol  0.63 mg Nebulization BID   loratadine  10 mg Oral Daily   mouth rinse  15 mL Mouth Rinse BID   melatonin  5 mg Oral Once   methylPREDNISolone (SOLU-MEDROL) injection  62.5 mg Intravenous Daily   metoprolol succinate  25 mg Oral Daily   oseltamivir  30 mg Oral BID   pantoprazole  40 mg Oral Q0600   potassium chloride  40 mEq Oral BID   rosuvastatin  10 mg Oral Daily   sodium bicarbonate  650 mg Oral BID   tamsulosin  0.4 mg Oral QPC supper   Continuous Infusions:  sodium chloride Stopped (09/01/21 0807)   vancomycin Stopped (09/02/21 0120)     LOS: 5 days        Hosie Poisson, MD [facility]   To contact the attending provider between 7A-7P or the covering provider during after hours 7P-7A, please log into the web site [email or web address] and access using universal [place] password for that web site. If you do not have the password, please call the hospital operator.  09/02/2021, 12:26 PM

## 2021-09-03 ENCOUNTER — Inpatient Hospital Stay (HOSPITAL_COMMUNITY): Payer: Medicare Other

## 2021-09-03 DIAGNOSIS — I480 Paroxysmal atrial fibrillation: Secondary | ICD-10-CM | POA: Diagnosis not present

## 2021-09-03 DIAGNOSIS — J189 Pneumonia, unspecified organism: Secondary | ICD-10-CM | POA: Diagnosis not present

## 2021-09-03 DIAGNOSIS — A419 Sepsis, unspecified organism: Secondary | ICD-10-CM | POA: Diagnosis not present

## 2021-09-03 DIAGNOSIS — R0602 Shortness of breath: Secondary | ICD-10-CM | POA: Diagnosis not present

## 2021-09-03 DIAGNOSIS — R7881 Bacteremia: Secondary | ICD-10-CM | POA: Diagnosis not present

## 2021-09-03 DIAGNOSIS — J101 Influenza due to other identified influenza virus with other respiratory manifestations: Secondary | ICD-10-CM | POA: Diagnosis not present

## 2021-09-03 LAB — BASIC METABOLIC PANEL
Anion gap: 11 (ref 5–15)
BUN: 46 mg/dL — ABNORMAL HIGH (ref 8–23)
CO2: 20 mmol/L — ABNORMAL LOW (ref 22–32)
Calcium: 8.3 mg/dL — ABNORMAL LOW (ref 8.9–10.3)
Chloride: 106 mmol/L (ref 98–111)
Creatinine, Ser: 1.59 mg/dL — ABNORMAL HIGH (ref 0.61–1.24)
GFR, Estimated: 41 mL/min — ABNORMAL LOW (ref >60)
Glucose, Bld: 222 mg/dL — ABNORMAL HIGH (ref 70–99)
Potassium: 4.1 mmol/L (ref 3.5–5.1)
Sodium: 137 mmol/L (ref 135–145)

## 2021-09-03 MED ORDER — PREDNISONE 20 MG PO TABS
40.0000 mg | ORAL_TABLET | Freq: Every day | ORAL | Status: DC
  Administered 2021-09-04 – 2021-09-05 (×2): 40 mg via ORAL
  Filled 2021-09-03 (×2): qty 2

## 2021-09-03 MED ORDER — FUROSEMIDE 40 MG PO TABS
40.0000 mg | ORAL_TABLET | Freq: Every day | ORAL | Status: DC
  Administered 2021-09-03 – 2021-09-05 (×3): 40 mg via ORAL
  Filled 2021-09-03 (×3): qty 1

## 2021-09-03 NOTE — Progress Notes (Signed)
Physical Therapy Treatment Patient Details Name: Casey Lee MRN: [id number] DOB: 09/09/1928 Today's Date: 09/03/2021   History of Present Illness 87 year old male presented 08/28/21 with cough,  fever, chills, Flu A.  COVID 19 PCR negative. In ED patient noted to be hypotensive systolic blood pressures  A. fib with RVR, chest x-ray consistent with retrosternal densities. PMH including temporal arteritis on prednisone, CKD stage IIIa, hyperlipidemia, A. fib.    PT Comments    Patient making steady progress with mobility and initiated gait training this date with Min assist to steady, manage walker, and prevent 2 episodes of LOB. Pt with chronic Afib and HR max of 133 bpm reached with range from 100's-120's with majority of activity. Pt desats on RA to 90% but improve with 2L to 97%. Cues needed to standing rest breaks during gait due to increased WOB. Educated pt on benefit of ST rehab at SNF as pt's family is concerned about safety without assist at home and pt states his wife is unable to physically assist him and he remains at a high fall risk currently. Acute PT will continue to progress pt as able throughout stay.      Recommendations for follow up therapy are one component of a multi-disciplinary discharge planning process, led by the attending physician.  Recommendations may be updated based on patient status, additional functional criteria and insurance authorization.  Follow Up Recommendations  Skilled nursing-short term rehab (<3 hours/day)     Assistance Recommended at Discharge Intermittent Supervision/Assistance  Equipment Recommendations  Rolling walker (2 wheels)    Recommendations for Other Services       Precautions / Restrictions Precautions Precautions: Fall Precaution Comments: Watch SpO2 and RR Restrictions Weight Bearing Restrictions: No     Mobility  Bed Mobility               General bed mobility comments: pt OOB in recliner     Transfers Overall transfer level: Needs assistance Equipment used: Rolling walker (2 wheels) Transfers: Sit to/from Stand Sit to Stand: Min guard;Min assist           General transfer comment: guarding initaitally with pt completing power up and pt required min assist to steady in standing due to slight posterior lean.    Ambulation/Gait Ambulation/Gait assistance: Min assist Gait Distance (Feet): 60 Feet Assistive device: Rolling walker (2 wheels) Gait Pattern/deviations: Step-through pattern;Decreased step length - right;Decreased step length - left;Decreased stride length;Shuffle;Trunk flexed;Narrow base of support Gait velocity: decr     General Gait Details: cues for safe proximity to RW and safety with negotiating obstacles like doorframe and chair in hallway. Assist to manage walker and cues for standign rest breaks. Pt wtih lateral sway/lean and requried extrenal assist to prevent LOB 2x. HR max of 133bpm with range in 100's-120's most of gait. SpO2 dropped to 90% on RA and increased to 97% with 2L/min.   Stairs             Wheelchair Mobility    Modified Rankin (Stroke Patients Only)       Balance Overall balance assessment: Needs assistance Sitting-balance support: Bilateral upper extremity supported;Feet supported Sitting balance-Leahy Scale: Good     Standing balance support: During functional activity;Bilateral upper extremity supported;Reliant on assistive device for balance Standing balance-Leahy Scale: Poor                              Cognition Arousal/Alertness: Awake/alert Behavior During Therapy:  WFL for tasks assessed/performed Overall Cognitive Status: Within Functional Limits for tasks assessed                                 General Comments: slightly slow processing and pt seems to go off on tangents or get confused but then returns to topic at hand.        Exercises      General Comments         Pertinent Vitals/Pain Pain Assessment: No/denies pain    Home Living                          Prior Function            PT Goals (current goals can now be found in the care plan section) Acute Rehab PT Goals PT Goal Formulation: With patient Time For Goal Achievement: 09/14/21 Potential to Achieve Goals: Good Progress towards PT goals: Progressing toward goals    Frequency    Min 3X/week (pt might decline SNF)      PT Plan Current plan remains appropriate;Frequency needs to be updated;Discharge plan needs to be updated    Co-evaluation              AM-PAC PT "6 Clicks" Mobility   Outcome Measure  Help needed turning from your back to your side while in a flat bed without using bedrails?: A Little Help needed moving from lying on your back to sitting on the side of a flat bed without using bedrails?: A Little Help needed moving to and from a bed to a chair (including a wheelchair)?: A Little Help needed standing up from a chair using your arms (e.g., wheelchair or bedside chair)?: A Little Help needed to walk in hospital room?: A Little Help needed climbing 3-5 steps with a railing? : Total 6 Click Score: 16    End of Session Equipment Utilized During Treatment: Gait belt;Oxygen Activity Tolerance: Patient tolerated treatment well Patient left: in chair;with call bell/phone within reach;with chair alarm set Nurse Communication: Mobility status PT Visit Diagnosis: Unsteadiness on feet (R26.81);Difficulty in walking, not elsewhere classified (R26.2)     Time: 5681-2751 PT Time Calculation (min) (ACUTE ONLY): 34 min  Charges:  $Gait Training: 23-37 mins                     Casey Lee, DPT Acute Rehabilitation Services Office [phone] Pager [phone]    Casey Lee 09/03/2021, 12:19 PM

## 2021-09-03 NOTE — Progress Notes (Signed)
PROGRESS NOTE    Casey Lee  QPR:[id number] DOB: 1928/09/19 DOA: 08/28/2021 PCP: [name], MD   Chief Complaint  Patient presents with   Fever   Weakness    Brief Narrative:  87 year old gentleman with prior history of temporal arteritis on prednisone, stage IIIa CKD, hyperlipidemia, atrial fibrillation presents with cough fevers and chills.  Patient was found to to have influenza A .  He ws started on steroids, tamiflu. Hospital course complicated by staph epidermidis bacteremia. Pt currently on IV vancomycin and ID on board.    Assessment & Plan:   Principal Problem:   Sepsis ([place]) Active Problems:   Essential hypertension   HLD (hyperlipidemia)   Temporal arteritis (HCC)   PAF (paroxysmal atrial fibrillation) (HCC)   CAP (community acquired pneumonia)   Influenza A   Generalized weakness   Bacteremia due to Gram-positive bacteria   MRSE bacteremia   Hypokalemia   Sepsis secondary to pneumonia/staph epidermidis bacteremia/influenza A infection Improving sepsis physiology. Patient currently afebrile and leukocytosis is present.  Initial blood cultures were positive for staph epidermidis, repeat blood cultures have been negative so far ID consulted, recommendations for the duration of antibiotics  given. Plan to d/c antibiotics on 12/4.  2D echocardiogram is negative for vegetations.  Influenza A positive infection/acute respiratory failure with hypoxia probably secondary to influenza A infection Continue the course of Tamiflu and continue with symptomatic management with bronchodilators Flonase PPI. Transition from solucortef to solumedrol today as he continues to have wheezing.  With transition to solumedrol, his wheezing has improved, his doe has improved too. Recommend transitioning to prednisone 40 mg daily.  Transfer to telemetry later today.     Atrial fibrillation with RVR probably secondary to acute infection TSH within normal  limits Echocardiogram shows left ventricular ejection fraction of 45 to 50%, with lateral hypokinesis, basal inferior wall hypokinesis.  Patient transitioned from IV amiodarone to oral amiodarone 200 mg twice daily and Toprol. Patient is on Eliquis for anticoagulation. Appreciate cardiology recommendations.  Cardiology plans to repeat another echo as outpatient.      History of temporal arteritis Was on prednisone , currently on Solu-Medrol.  Hyperlipidemia Continue with statin   Acute metabolic encephalopathy Probably secondary to sepsis, pneumonia, influenza infection. Confused this am but still able to recognize family, answer simple questions.      History of chronic thrombocytopenia Platelet counts have improved    Stage IIIa CKD Continue to monitor. Creatinine remained stable around 1.6.    Hypokalemia Replaced.     DVT prophylaxis: Eliquis Code Status: Full code Family Communication: Family at bedside discussed the plan with family at bedside.   Disposition:   Status is: Inpatient  Remains inpatient appropriate because: IV amiodarone       Consultants:  Cardiology Infectious disease Procedures: Echocardiogram Antimicrobials:  Antibiotics Given (last 72 hours)     Date/Time Action Medication Dose Rate   08/31/21 2251 Given   oseltamivir (TAMIFLU) capsule 30 mg 30 mg    09/01/21 0138 New Bag/Given   vancomycin (VANCOREADY) IVPB 750 mg/150 mL 750 mg 150 mL/hr   09/01/21 1021 Given   oseltamivir (TAMIFLU) capsule 30 mg 30 mg    09/01/21 2123 Given   oseltamivir (TAMIFLU) capsule 30 mg 30 mg    09/02/21 0039 New Bag/Given   vancomycin (VANCOREADY) IVPB 750 mg/150 mL 750 mg 150 mL/hr   09/02/21 9935 Given   oseltamivir (TAMIFLU) capsule 30 mg 30 mg    09/02/21 2134 Given  oseltamivir (TAMIFLU) capsule 30 mg 30 mg    09/03/21 0028 New Bag/Given   vancomycin (VANCOREADY) IVPB 750 mg/150 mL 750 mg 150 mL/hr        Subjective: As per RN  pt was confused this am.  No chest pain, cough and sob improved.  Objective: Vitals:   09/03/21 0800 09/03/21 0821 09/03/21 0842 09/03/21 1111  BP: (!) 142/77   113/66  Pulse: (!) 105   (!) 107  Resp: 20     Temp:   (!) 97.3 F (36.3 C)   TempSrc:   Oral   SpO2: 96% 90%    Weight:      Height:        Intake/Output Summary (Last 24 hours) at 09/03/2021 1251 Last data filed at 09/03/2021 0500 Gross per 24 hour  Intake 70.61 ml  Output 1100 ml  Net -1029.39 ml    Filed Weights   09/01/21 0500 09/02/21 0400 09/03/21 0457  Weight: 70.7 kg 55 kg 51.5 kg    Examination:  General exam: Elderly gentleman, ill-appearing, sitting in the chair not in any kind of distress Respiratory system: Air entry fair, wheezing has improved posteriorly on 2 L of nasal cannula oxygen Cardiovascular system: S1 & S2 heard, RRR. No JVD, . No pedal edema. Gastrointestinal system: Abdomen is nondistended, soft and nontender. Normal bowel sounds heard. Central nervous system: Alert and oriented to person and place only.  Extremities: Symmetric 5 x 5 power. Skin: No rashes, lesions or ulcers Psychiatry:  Mood & affect appropriate.      Data Reviewed: I have personally reviewed following labs and imaging studies  CBC: Recent Labs  Lab 08/29/21 0225 08/30/21 0257 08/31/21 0239 09/01/21 0250 09/02/21 1156  WBC 9.1 18.8* 15.4* 11.7* 13.0*  NEUTROABS 7.7 16.7* 13.6* 9.9* 11.3*  HGB 13.7 13.4 13.0 12.9* 13.7  HCT 41.9 41.1 40.3 40.7 41.1  MCV 93.7 92.4 94.2 94.4 90.3  PLT 144* 151 137* 144* 186     Basic Metabolic Panel: Recent Labs  Lab 08/30/21 0257 08/31/21 0239 09/01/21 0250 09/02/21 0236 09/03/21 0239  NA 138 137 135 137 137  K 3.9 3.3* 3.4* 3.4* 4.1  CL 104 106 107 107 106  CO2 18* 18* 20* 20* 20*  GLUCOSE 155* 173* 162* 157* 222*  BUN 31* 36* 39* 41* 46*  CREATININE 1.58* 1.52* 1.54* 1.61* 1.59*  CALCIUM 8.9 8.7* 8.2* 8.2* 8.3*  MG 1.8 1.8 2.0  --   --       GFR: Estimated Creatinine Clearance: 22 mL/min (A) (by C-G formula based on SCr of 1.59 mg/dL (H)).  Liver Function Tests: Recent Labs  Lab 08/28/21 2143 08/29/21 0225  AST 21 33  ALT 18 25  ALKPHOS 50 53  BILITOT 1.0 0.9  PROT 6.3* 6.2*  ALBUMIN 3.5 3.3*     CBG: No results for input(s): GLUCAP in the last 168 hours.   Recent Results (from the past 240 hour(s))  Resp Panel by RT-PCR (Flu A&B, Covid) Nasopharyngeal Swab     Status: Abnormal   Collection Time: 08/28/21  9:32 PM   Specimen: Nasopharyngeal Swab; Nasopharyngeal(NP) swabs in vial transport medium  Result Value Ref Range Status   SARS Coronavirus 2 by RT PCR NEGATIVE NEGATIVE Final    Comment: (NOTE) SARS-CoV-2 target nucleic acids are NOT DETECTED.  The SARS-CoV-2 RNA is generally detectable in upper respiratory specimens during the acute phase of infection. The lowest concentration of SARS-CoV-2 viral copies this assay can detect  is 138 copies/mL. A negative result does not preclude SARS-Cov-2 infection and should not be used as the sole basis for treatment or other patient management decisions. A negative result may occur with  improper specimen collection/handling, submission of specimen other than nasopharyngeal swab, presence of viral mutation(s) within the areas targeted by this assay, and inadequate number of viral copies(<138 copies/mL). A negative result must be combined with clinical observations, patient history, and epidemiological information. The expected result is Negative.  Fact Sheet for Patients:  EntrepreneurPulse.com.au  Fact Sheet for Healthcare Providers:  IncredibleEmployment.be  This test is no t yet approved or cleared by the [place] FDA and  has been authorized for detection and/or diagnosis of SARS-CoV-2 by FDA under an Emergency Use Authorization (EUA). This EUA will remain  in effect (meaning this test can be used) for the  duration of the COVID-19 declaration under Section 564(b)(1) of the Act, 21 U.S.C.section 360bbb-3(b)(1), unless the authorization is terminated  or revoked sooner.       Influenza A by PCR POSITIVE (A) NEGATIVE Final   Influenza B by PCR NEGATIVE NEGATIVE Final    Comment: (NOTE) The Xpert Xpress SARS-CoV-2/FLU/RSV plus assay is intended as an aid in the diagnosis of influenza from Nasopharyngeal swab specimens and should not be used as a sole basis for treatment. Nasal washings and aspirates are unacceptable for Xpert Xpress SARS-CoV-2/FLU/RSV testing.  Fact Sheet for Patients: EntrepreneurPulse.com.au  Fact Sheet for Healthcare Providers: IncredibleEmployment.be  This test is not yet approved or cleared by the [place] FDA and has been authorized for detection and/or diagnosis of SARS-CoV-2 by FDA under an Emergency Use Authorization (EUA). This EUA will remain in effect (meaning this test can be used) for the duration of the COVID-19 declaration under Section 564(b)(1) of the Act, 21 U.S.C. section 360bbb-3(b)(1), unless the authorization is terminated or revoked.  Performed at [facility], [place] [address], [place], [place] [address]   Blood Culture (routine x 2)     Status: Abnormal   Collection Time: 08/28/21  9:38 PM   Specimen: BLOOD  Result Value Ref Range Status   Specimen Description   Final    BLOOD LEFT ANTECUBITAL Performed at [facility] [address] [place], [place] [address]    Special Requests   Final    BOTTLES DRAWN AEROBIC AND ANAEROBIC Blood Culture results may not be optimal due to an excessive volume of blood received in culture bottles Performed at [facility] [address], [place], [place] [address]    Culture  Setup Time   Final    GRAM POSITIVE COCCI IN CLUSTERS ANAEROBIC BOTTLE ONLY CRITICAL RESULT CALLED TO, READ BACK BY AND VERIFIED  WITH: Ramon Dredge [id number] FCP Performed at [facility], [place] [address], [place], [place] [address]    Culture STAPHYLOCOCCUS EPIDERMIDIS (A)  Final   Report Status 08/31/2021 FINAL  Final   Organism ID, Bacteria STAPHYLOCOCCUS EPIDERMIDIS  Final      Susceptibility   Staphylococcus epidermidis - MIC*    CIPROFLOXACIN 4 RESISTANT Resistant     ERYTHROMYCIN <=0.25 SENSITIVE Sensitive     GENTAMICIN <=0.5 SENSITIVE Sensitive     OXACILLIN >=4 RESISTANT Resistant     TETRACYCLINE <=1 SENSITIVE Sensitive     VANCOMYCIN 1 SENSITIVE Sensitive     TRIMETH/SULFA <=10 SENSITIVE Sensitive     CLINDAMYCIN <=0.25 SENSITIVE Sensitive     RIFAMPIN <=0.5 SENSITIVE Sensitive  Inducible Clindamycin NEGATIVE Sensitive     * STAPHYLOCOCCUS EPIDERMIDIS  Blood Culture (routine x 2)     Status: Abnormal   Collection Time: 08/28/21  9:38 PM   Specimen: BLOOD  Result Value Ref Range Status   Specimen Description   Final    BLOOD RIGHT ANTECUBITAL Performed at [facility] [address] [place], [place] [address]    Special Requests   Final    BOTTLES DRAWN AEROBIC AND ANAEROBIC Blood Culture results may not be optimal due to an excessive volume of blood received in culture bottles Performed at [facility] [address], [place], [place] [address]    Culture  Setup Time   Final    GRAM POSITIVE COCCI IN CLUSTERS IN BOTH AEROBIC AND ANAEROBIC BOTTLES CRITICAL RESULT CALLED TO, READ BACK BY AND VERIFIED WITH: PHARMD LEANNE P [id number] FCP    Culture (A)  Final    STAPHYLOCOCCUS EPIDERMIDIS SUSCEPTIBILITIES PERFORMED ON PREVIOUS CULTURE WITHIN THE LAST 5 DAYS. Performed at [facility], [place] [address], [place], [place] [address]    Report Status 08/31/2021 FINAL  Final  Blood Culture ID Panel (Reflexed)     Status: Abnormal   Collection Time: 08/28/21  9:38 PM  Result Value Ref Range Status   Enterococcus faecalis NOT DETECTED NOT  DETECTED Final   Enterococcus Faecium NOT DETECTED NOT DETECTED Final   Listeria monocytogenes NOT DETECTED NOT DETECTED Final   Staphylococcus species DETECTED (A) NOT DETECTED Final    Comment: CRITICAL RESULT CALLED TO, READ BACK BY AND VERIFIED WITH: PHARMD LEANNE P [id number] FCP    Staphylococcus aureus (BCID) NOT DETECTED NOT DETECTED Final   Staphylococcus epidermidis DETECTED (A) NOT DETECTED Final    Comment: Methicillin (oxacillin) resistant coagulase negative staphylococcus. Possible blood culture contaminant (unless isolated from more than one blood culture draw or clinical case suggests pathogenicity). No antibiotic treatment is indicated for blood  culture contaminants. CRITICAL RESULT CALLED TO, READ BACK BY AND VERIFIED WITH: PHARMD LEANNE P [phone] [id number] FCP    Staphylococcus lugdunensis NOT DETECTED NOT DETECTED Final   Streptococcus species NOT DETECTED NOT DETECTED Final   Streptococcus agalactiae NOT DETECTED NOT DETECTED Final   Streptococcus pneumoniae NOT DETECTED NOT DETECTED Final   Streptococcus pyogenes NOT DETECTED NOT DETECTED Final   A.calcoaceticus-baumannii NOT DETECTED NOT DETECTED Final   Bacteroides fragilis NOT DETECTED NOT DETECTED Final   Enterobacterales NOT DETECTED NOT DETECTED Final   Enterobacter cloacae complex NOT DETECTED NOT DETECTED Final   Escherichia coli NOT DETECTED NOT DETECTED Final   Klebsiella aerogenes NOT DETECTED NOT DETECTED Final   Klebsiella oxytoca NOT DETECTED NOT DETECTED Final   Klebsiella pneumoniae NOT DETECTED NOT DETECTED Final   Proteus species NOT DETECTED NOT DETECTED Final   Salmonella species NOT DETECTED NOT DETECTED Final   Serratia marcescens NOT DETECTED NOT DETECTED Final   Haemophilus influenzae NOT DETECTED NOT DETECTED Final   Neisseria meningitidis NOT DETECTED NOT DETECTED Final   Pseudomonas aeruginosa NOT DETECTED NOT DETECTED Final   Stenotrophomonas maltophilia NOT DETECTED NOT DETECTED Final    Candida albicans NOT DETECTED NOT DETECTED Final   Candida auris NOT DETECTED NOT DETECTED Final   Candida glabrata NOT DETECTED NOT DETECTED Final   Candida krusei NOT DETECTED NOT DETECTED Final   Candida parapsilosis NOT DETECTED NOT DETECTED Final   Candida tropicalis NOT DETECTED NOT DETECTED Final   Cryptococcus neoformans/gattii NOT DETECTED NOT DETECTED Final  Methicillin resistance mecA/C DETECTED (A) NOT DETECTED Final    Comment: CRITICAL RESULT CALLED TO, READ BACK BY AND VERIFIED WITH: Ramon Dredge [id number] FCP Performed at [facility], [place] [address], [place], [place] [address]   MRSA Next Gen by PCR, Nasal     Status: None   Collection Time: 08/29/21  2:08 AM   Specimen: Nasal Mucosa; Nasal Swab  Result Value Ref Range Status   MRSA by PCR Next Gen NOT DETECTED NOT DETECTED Final    Comment: (NOTE) The GeneXpert MRSA Assay (FDA approved for NASAL specimens only), is one component of a comprehensive MRSA colonization surveillance program. It is not intended to diagnose MRSA infection nor to guide or monitor treatment for MRSA infections. Test performance is not FDA approved in patients less than 21 years old. Performed at [facility], [place] [address], [place], [place] [address]   Urine Culture     Status: None   Collection Time: 08/29/21  1:20 PM   Specimen: Urine, Random  Result Value Ref Range Status   Specimen Description   Final    URINE, RANDOM Performed at [facility] [address] [place], [place] [address]    Special Requests   Final    NONE Performed at [facility], [place] [address] [place], [place] [address]    Culture   Final    NO GROWTH Performed at [facility], [place] [address] [place], [place] [address]    Report Status 08/30/2021 FINAL  Final  Culture, blood (Routine X 2) w Reflex to ID Panel     Status: None (Preliminary result)   Collection Time: 08/31/21  2:55  PM   Specimen: Left Antecubital; Blood  Result Value Ref Range Status   Specimen Description   Final    LEFT ANTECUBITAL Performed at [facility] [address], [place], [place] [address]    Special Requests   Final    BOTTLES DRAWN AEROBIC ONLY Blood Culture adequate volume Performed at [facility] [address], [place], [place] [address]    Culture   Final    NO GROWTH 3 DAYS Performed at [facility], [place] [address], [place], [place] [address]    Report Status PENDING  Incomplete  Culture, blood (Routine X 2) w Reflex to ID Panel     Status: None (Preliminary result)   Collection Time: 08/31/21  3:41 PM   Specimen: BLOOD LEFT FOREARM  Result Value Ref Range Status   Specimen Description   Final    BLOOD LEFT FOREARM Performed at [facility] [address] [place], [place] [address]    Special Requests   Final    BOTTLES DRAWN AEROBIC ONLY Blood Culture results may not be optimal due to an inadequate volume of blood received in culture bottles Performed at [facility] [address], [place], [place] [address]    Culture   Final    NO GROWTH 3 DAYS Performed at [facility], [place] [address] [place],  [address]    Report Status PENDING  Incomplete          Radiology Studies: No results found.      Scheduled Meds:  amiodarone  200 mg Oral BID   apixaban  2.5 mg Oral BID   Chlorhexidine Gluconate Cloth  6 each Topical Q0600   dextromethorphan-guaiFENesin  1 tablet Oral  BID   fluticasone  2 spray Each Nare Daily   furosemide  40 mg Oral Daily   levalbuterol  0.63 mg Nebulization BID   loratadine  10 mg Oral Daily   mouth rinse  15 mL Mouth Rinse BID   melatonin  5 mg Oral Once   methylPREDNISolone (SOLU-MEDROL) injection  62.5 mg Intravenous Daily   metoprolol succinate  25 mg Oral Daily   pantoprazole  40 mg Oral Q0600   rosuvastatin  10 mg Oral Daily    tamsulosin  0.4 mg Oral QPC supper   Continuous Infusions:  sodium chloride Stopped (09/01/21 0807)   vancomycin Stopped (09/03/21 0130)     LOS: 6 days        Hosie Poisson, MD [facility]   To contact the attending provider between 7A-7P or the covering provider during after hours 7P-7A, please log into the web site [email or web address] and access using universal [place] password for that web site. If you do not have the password, please call the hospital operator.  09/03/2021, 12:51 PM

## 2021-09-03 NOTE — Progress Notes (Signed)
Progress Note  Patient Name: Casey Lee Date of Encounter: 09/03/2021  [facility] Cardiologist: Buford Dresser, MD   Subjective   No acute events overnight. Breathing feeling better, but cough still very bothersome. We reviewed plans for management of his atrial fibrillation. Daughter in law present for our discussion.  Inpatient Medications    Scheduled Meds:  amiodarone  200 mg Oral BID   apixaban  2.5 mg Oral BID   Chlorhexidine Gluconate Cloth  6 each Topical Q0600   dextromethorphan-guaiFENesin  1 tablet Oral BID   fluticasone  2 spray Each Nare Daily   furosemide  40 mg Oral Daily   levalbuterol  0.63 mg Nebulization BID   loratadine  10 mg Oral Daily   mouth rinse  15 mL Mouth Rinse BID   melatonin  5 mg Oral Once   methylPREDNISolone (SOLU-MEDROL) injection  62.5 mg Intravenous Daily   metoprolol succinate  25 mg Oral Daily   pantoprazole  40 mg Oral Q0600   rosuvastatin  10 mg Oral Daily   tamsulosin  0.4 mg Oral QPC supper   Continuous Infusions:  sodium chloride Stopped (09/01/21 0807)   vancomycin Stopped (09/03/21 0130)   PRN Meds: sodium chloride, acetaminophen **OR** acetaminophen, benzonatate, levalbuterol, QUEtiapine   Vital Signs    Vitals:   09/03/21 0700 09/03/21 0800 09/03/21 0821 09/03/21 0842  BP: (!) 133/56 (!) 142/77    Pulse: 98 (!) 105    Resp: (!) 25 20    Temp:    (!) 97.3 F (36.3 C)  TempSrc:    Oral  SpO2: 94% 96% 90%   Weight:      Height:        Intake/Output Summary (Last 24 hours) at 09/03/2021 1045 Last data filed at 09/03/2021 0500 Gross per 24 hour  Intake 70.61 ml  Output 1100 ml  Net -1029.39 ml   Last 3 Weights 09/03/2021 09/02/2021 09/01/2021  Weight (lbs) 113 lb 8.6 oz 121 lb 4.1 oz 155 lb 13.8 oz  Weight (kg) 51.5 kg 55 kg 70.7 kg      Telemetry    Atrial fibrillation, rates 80s-90s largely, trending over 100 when active- Personally Reviewed  ECG    No new since 11/27 - Personally  Reviewed  Physical Exam   GEN: Well nourished, well developed in no acute distress. Hurst in place NECK: No JVD CARDIAC: irregularly irregular rhythm, normal S1 and S2, no rubs or gallops. 2/6 systolic murmur. VASCULAR: Radial pulses 2+ bilaterally.  RESPIRATORY:  Largely clear but diffuse expiratory wheezing ABDOMEN: Soft, non-tender, non-distended MUSCULOSKELETAL:  Moves all 4 limbs independently SKIN: Warm and dry, no edema NEUROLOGIC:  No focal neuro deficits noted. PSYCHIATRIC:  Normal affect    Labs    High Sensitivity Troponin:  No results for input(s): TROPONINIHS in the last 720 hours.   Chemistry Recent Labs  Lab 08/28/21 2143 08/29/21 0225 08/30/21 0257 08/31/21 0239 09/01/21 0250 09/02/21 0236 09/03/21 0239  NA 137 137 138 137 135 137 137  K 3.9 4.2 3.9 3.3* 3.4* 3.4* 4.1  CL 109 107 104 106 107 107 106  CO2 20* 20* 18* 18* 20* 20* 20*  GLUCOSE 200* 184* 155* 173* 162* 157* 222*  BUN 31* 30* 31* 36* 39* 41* 46*  CREATININE 1.64* 1.48* 1.58* 1.52* 1.54* 1.61* 1.59*  CALCIUM 8.6* 8.6* 8.9 8.7* 8.2* 8.2* 8.3*  MG  --   --  1.8 1.8 2.0  --   --   PROT  6.3* 6.2*  --   --   --   --   --   ALBUMIN 3.5 3.3*  --   --   --   --   --   AST 21 33  --   --   --   --   --   ALT 18 25  --   --   --   --   --   ALKPHOS 50 53  --   --   --   --   --   BILITOT 1.0 0.9  --   --   --   --   --   GFRNONAA 39* 44* 41* 43* 42* 40* 41*  ANIONGAP 8 10 16* 13 8 10 11     Lipids No results for input(s): CHOL, TRIG, HDL, LABVLDL, LDLCALC, CHOLHDL in the last 168 hours.  Hematology Recent Labs  Lab 08/31/21 0239 09/01/21 0250 09/02/21 1156  WBC 15.4* 11.7* 13.0*  RBC 4.28 4.31 4.55  HGB 13.0 12.9* 13.7  HCT 40.3 40.7 41.1  MCV 94.2 94.4 90.3  MCH 30.4 29.9 30.1  MCHC 32.3 31.7 33.3  RDW 14.7 14.6 14.7  PLT 137* 144* 186   Thyroid  Recent Labs  Lab 08/30/21 0257  TSH 1.414    BNP Recent Labs  Lab 09/01/21 0847  BNP 298.8*    DDimer No results for input(s): DDIMER  in the last 168 hours.   Radiology    No results found.  Cardiac Studies   Echo 08/30/21 1. POor acoustic windows limit study. Laterall hypokinesis (mid/distal), basal inferior hypokinesis.. Left ventricular ejection fraction, by estimation, is 45 to 50%. The left ventricle has mildly decreased function. Left ventricular diastolic parameters are indeterminate.   2. Right ventricular systolic function is normal. The right ventricular size is normal.   3. Mitral regurgitation is eccentric directed along posterior wall of left atrium . Mild to moderate mitral valve regurgitation.   4. AV is thickened, calcified with restricted motion Peak and mean gradients through the valve are 42 and 24 mm Hg respectively. Stroke volume index is 17, suggesting this may be low gradient AS. Dimensionless index is 18, consistent with severe AS. The aortic valve was not well visualized. Aortic valve regurgitation is not visualized.   5. The inferior vena cava is normal in size with <50% respiratory variability, suggesting right atrial pressure of 8 mmHg.  Patient Profile     87 y.o. male with PMH paroxysmal atrial fibrillation, presenting with influenza A and found to have atrial fibrillation with RVR  Assessment & Plan    Atrial fibrillation, paroxysmal vs persistent Cardiomyopathy, unknown if ischemic or nonischemic. EF 45-50% -he cannot detect whether he is in afib, difficult to determine duration -has been on apixaban, dose reduced for age and renal function -continue metoprolol succinate for now, no room to uptitrate based on blood pressure variability (can drop to upper 48N systolic) -given acute illness, afib with RVR, lack of chest pain will not pursue ischemic evaluation at this time. Recommend medical therapy of cardiomyopathy as tolerated -would recheck echo as an outpatient once rate is stable and patient clinically improved  -has needed significant K supplementation, even without diuretics. -has  done well on oral amiodarone. -will change to daily oral lasix for now as he has had intermittent pulmonary edema, likely due to his acute infection. Can reassess at follow up. -weights appear inaccurate. Listed at 51.5 kg today, peak 70.7 kg, 68.2 kg on admission, but  only net negative 1 L.  Possible aortic stenosis -gradient is moderate, but dimensionless index and stroke volume index suggest possibly severe -murmur does not sound severe -would recheck limited echo once rate slower/respiratory status improved to interrogate aortic valve, check EF. This can be done as an outpatient   Influenza A MRSE bacteremia -treatment per primary team and infectious disease  [facility] will sign off.   Medication Recommendations:    Amiodarone 200 mg BID until 09/15/21, then 200 mg daily Apixaban 2.5 mg BID Furosemide 40 mg daily, reassess at follow up Metoprolol succinate 25 mg daily Potassium chloride 40 mEq daily until follow up  Other recommendations (labs, testing, etc):  repeat BMET to follow K Follow up as an outpatient:  We have arranged for him to see me 09/23/21 at ( AM at my [facility] [place] office  For questions or updates, please contact [place] Please consult [email or web address] for contact info under     Signed, Buford Dresser, MD  09/03/2021, 10:45 AM

## 2021-09-03 NOTE — Progress Notes (Signed)
   09/03/21 1720  What Happened  Was fall witnessed? No  Was patient injured? No  Patient found on floor  Found by Staff-comment (Jaydalyn Demattia)  Stated prior activity other (comment) (Multiple different answers given)  Follow Up  MD notified Dr. Karleen Hampshire  Time MD notified [phone]  Adult Fall Risk Assessment  Risk Factor Category (scoring not indicated) High fall risk per protocol (document High fall risk)  Age 87  Fall History: Fall within 6 months prior to admission 0  Elimination; Bowel and/or Urine Incontinence 0  Elimination; Bowel and/or Urine Urgency/Frequency 0  Patient Fall Risk Level High fall risk  Adult Fall Risk Interventions  Required Bundle Interventions *See Row Information* High fall risk - low, moderate, and high requirements implemented  Additional Interventions Use of appropriate toileting equipment (bedpan, BSC, etc.);Room near nurses station;Lap belt while in chair/wheelchair (Rehab only)  Screening for Fall Injury Risk (To be completed on HIGH fall risk patients) - Assessing Need for Floor Mats  Risk For Fall Injury- Criteria for Floor Mats 52 years or older  Will Implement Floor Mats Yes  Pain Assessment  Pain Scale 0-10  Pain Score 0  PCA/Epidural/Spinal Assessment  Respiratory Pattern Regular;Unlabored;Symmetrical  Neurological  Neuro (WDL) X  Level of Consciousness Alert  Orientation Level Oriented to person;Disoriented to place;Oriented to situation;Disoriented to time  Cognition Follows commands;Impulsive;Memory impairment  Speech Clear  Pupil Assessment  Yes  R Pupil Size (mm) 3  R Pupil Shape Round  R Pupil Reaction Brisk  L Pupil Size (mm) 3  L Pupil Shape Round  L Pupil Reaction Brisk  Additional Pupil Assessments No  Glasgow Coma Scale  Eye Opening 4  Best Verbal Response (NON-intubated) 4  Best Motor Response 6  Glasgow Coma Scale Score 14

## 2021-09-04 DIAGNOSIS — R0602 Shortness of breath: Secondary | ICD-10-CM | POA: Diagnosis not present

## 2021-09-04 DIAGNOSIS — I480 Paroxysmal atrial fibrillation: Secondary | ICD-10-CM | POA: Diagnosis not present

## 2021-09-04 DIAGNOSIS — J101 Influenza due to other identified influenza virus with other respiratory manifestations: Secondary | ICD-10-CM | POA: Diagnosis not present

## 2021-09-04 LAB — BASIC METABOLIC PANEL
Anion gap: 12 (ref 5–15)
BUN: 53 mg/dL — ABNORMAL HIGH (ref 8–23)
CO2: 20 mmol/L — ABNORMAL LOW (ref 22–32)
Calcium: 8.3 mg/dL — ABNORMAL LOW (ref 8.9–10.3)
Chloride: 105 mmol/L (ref 98–111)
Creatinine, Ser: 1.83 mg/dL — ABNORMAL HIGH (ref 0.61–1.24)
GFR, Estimated: 34 mL/min — ABNORMAL LOW (ref >60)
Glucose, Bld: 228 mg/dL — ABNORMAL HIGH (ref 70–99)
Potassium: 3.5 mmol/L (ref 3.5–5.1)
Sodium: 137 mmol/L (ref 135–145)

## 2021-09-04 NOTE — Progress Notes (Signed)
Pharmacy Antibiotic Note  Casey Lee is a 87 y.o. male admitted on 08/28/2021 with sepsis due to pneumonia.  Patient also +Influenza A and started on Tamiflu.  Pharmacy has been consulted for Vancomycin dosing for blood culture (2 of 4) + methicillin resistant staph epidermidis.  Plan: Serum Creatinine bumped up today to 1.83, last Vanc dose 750mg  today at 0100 Calculated Vanc dose for increased SCr would be 500mg  q48h, therefore will discontinue last dose due for 12/4 to avoid any further renal dysfx Planned for shorter course of vancomycin for bacteremia  Completed Tamiflu 30mg  daily x 5 days on 12/1 Repeat BCx negative Follow renal function   Height: 5\' 9"  (175.3 cm) Weight: 50.9 kg (112 lb 3.4 oz) IBW/kg (Calculated) : 70.7  Temp (24hrs), Avg:97.4 F (36.3 C), Min:97.2 F (36.2 C), Max:97.5 F (36.4 C)  Recent Labs  Lab 08/28/21 2143 08/29/21 0139 08/29/21 0225 08/30/21 0257 08/31/21 0239 09/01/21 0250 09/02/21 0236 09/02/21 1156 09/03/21 0239 09/04/21 0257  WBC 9.4  --  9.1 18.8* 15.4* 11.7*  --  13.0*  --   --   CREATININE 1.64*  --  1.48* 1.58* 1.52* 1.54* 1.61*  --  1.59* 1.83*  LATICACIDVEN 2.3* 2.6* 1.8  --   --   --   --   --   --   --      Estimated Creatinine Clearance: 18.9 mL/min (A) (by C-G formula based on SCr of 1.83 mg/dL (H)).    Allergies  Allergen Reactions   Fosamax [Alendronate] Other (See Comments)    SEVERE JAW(BONE) PAIN   Clopidogrel     Other reaction(s): Other (See Comments)    Antimicrobials this admission: 11/26 Ceftriaxone >>  11/28 11/26 Azithromycin >>  11/28  11/27 Tamiflu x 5 days 11/28 Vancomycin >> will cover to 12/4 +  Dose adjustments this admission:  Microbiology results: 11/27 MRSA - negative 11/27 UCx : ng-final 11/26 BCx: 3/4 bottles MRSE - Cipro resistant 11/26 flu A + 11/29 Plan rpt BCx: NGTD  Thank you for allowing pharmacy to be a part of this patient's care.  Minda Ditto PharmD [facility]  [phone] 09/04/2021, 10:31 AM

## 2021-09-04 NOTE — Progress Notes (Signed)
PROGRESS NOTE    Casey Lee  TIR:[id number] DOB: 07/06/1928 DOA: 08/28/2021 PCP: [name], MD   Chief Complaint  Patient presents with   Fever   Weakness    Brief Narrative:  87 year old gentleman with prior history of temporal arteritis on prednisone, stage IIIa CKD, hyperlipidemia, atrial fibrillation presents with cough fevers and chills.  Patient was found to to have influenza A .  He ws started on steroids, tamiflu. Hospital course complicated by staph epidermidis bacteremia. Pt currently on IV vancomycin and ID on board.    Assessment & Plan:   Principal Problem:   Sepsis ([place]) Active Problems:   Essential hypertension   HLD (hyperlipidemia)   Temporal arteritis (HCC)   PAF (paroxysmal atrial fibrillation) (HCC)   CAP (community acquired pneumonia)   Influenza A   Generalized weakness   Bacteremia due to Gram-positive bacteria   MRSE bacteremia   Hypokalemia   Sepsis secondary to pneumonia/staph epidermidis bacteremia/influenza A infection Improving sepsis physiology. Patient currently afebrile and leukocytosis is present.  Recheck CBC IN am.  Initial blood cultures were positive for staph epidermidis, repeat blood cultures have been negative so far ID consulted, recommendations for the duration of antibiotics  given. Completed the course of IV Vancomycin.  2D echocardiogram is negative for vegetations.  Influenza A positive infection/acute respiratory failure with hypoxia probably secondary to influenza A infection Completed the course of Tamiflu and continue with symptomatic management with bronchodilators Flonase PPI. Wheezing has improved with steroids. Transitioned to prednisone .  He has been weaned off oxygen.  Plan to check ambulating oxygen levels prior to discharge.  He continues to be dyspneic  and tachypnic on talking but better than the last 2 days.      Atrial fibrillation with RVR probably secondary to acute infection TSH within  normal limits Echocardiogram shows left ventricular ejection fraction of 45 to 50%, with lateral hypokinesis, basal inferior wall hypokinesis.  Patient transitioned from IV amiodarone to oral amiodarone 200 mg twice daily and Toprol. Patient is on Eliquis for anticoagulation. Appreciate cardiology recommendations.  Cardiology plans to repeat another echo as outpatient.      History of temporal arteritis Was on prednisone 5 mg daily.   Hyperlipidemia Continue with statin   Acute metabolic encephalopathy Probably secondary to sepsis, pneumonia, influenza infection. Appears to have resolved.     History of chronic thrombocytopenia Platelet counts have improved    Stage IIIa CKD Continue to monitor. Slight bump in creatinine to 1.8. monitor.     Hypokalemia Replaced.     DVT prophylaxis: Eliquis Code Status: Full code Family Communication: discussed the plan with daughter in law at bedside.    Disposition:   Status is: Inpatient  Remains inpatient appropriate because: unsafe plan , for SNF. He is stable for discharge to SNF.        Consultants:  Cardiology Infectious disease  Procedures: Echocardiogram Antimicrobials:  Antibiotics Given (last 72 hours)     Date/Time Action Medication Dose Rate   09/01/21 2123 Given   oseltamivir (TAMIFLU) capsule 30 mg 30 mg    09/02/21 0039 New Bag/Given   vancomycin (VANCOREADY) IVPB 750 mg/150 mL 750 mg 150 mL/hr   09/02/21 6761 Given   oseltamivir (TAMIFLU) capsule 30 mg 30 mg    09/02/21 2134 Given   oseltamivir (TAMIFLU) capsule 30 mg 30 mg    09/03/21 0028 New Bag/Given   vancomycin (VANCOREADY) IVPB 750 mg/150 mL 750 mg 150 mL/hr   09/04/21 0204 New  Bag/Given   vancomycin (VANCOREADY) IVPB 750 mg/150 mL 750 mg 150 mL/hr        Subjective: STILL VERY DYSPNEIC on talking. No chest pain. No nausea, vomiting.  Objective: Vitals:   09/04/21 0800 09/04/21 0804 09/04/21 0828 09/04/21 0900  BP:  (!)  155/102 120/79 121/76  Pulse: 81 (!) 120 96 98  Resp: 18 (!) 27 (!) 26 (!) 24  Temp:   (!) 97.2 F (36.2 C)   TempSrc:   Axillary   SpO2: 92% 93% 94% 94%  Weight:      Height:        Intake/Output Summary (Last 24 hours) at 09/04/2021 1204 Last data filed at 09/04/2021 0344 Gross per 24 hour  Intake 379.29 ml  Output 700 ml  Net -320.71 ml    Filed Weights   09/02/21 0400 09/03/21 0457 09/04/21 0500  Weight: 55 kg 51.5 kg 50.9 kg    Examination:  General exam: elderly gentleman, in chair, not in distress.  Respiratory system: air entry fair, scattered wheezing posteriorly. On RA. Tachypnea present.  Cardiovascular system: S1 & S2 heard, irregularly irregular, tachycardic. No pedal edema. Gastrointestinal system: Abdomen is nondistended, soft and nontender. . Normal bowel sounds heard. Central nervous system: Alert and oriented. No focal neurological deficits. Extremities: Symmetric 5 x 5 power. Skin: No rashes, lesions or ulcers Psychiatry: Mood & affect appropriate.       Data Reviewed: I have personally reviewed following labs and imaging studies  CBC: Recent Labs  Lab 08/29/21 0225 08/30/21 0257 08/31/21 0239 09/01/21 0250 09/02/21 1156  WBC 9.1 18.8* 15.4* 11.7* 13.0*  NEUTROABS 7.7 16.7* 13.6* 9.9* 11.3*  HGB 13.7 13.4 13.0 12.9* 13.7  HCT 41.9 41.1 40.3 40.7 41.1  MCV 93.7 92.4 94.2 94.4 90.3  PLT 144* 151 137* 144* 186     Basic Metabolic Panel: Recent Labs  Lab 08/30/21 0257 08/31/21 0239 09/01/21 0250 09/02/21 0236 09/03/21 0239 09/04/21 0257  NA 138 137 135 137 137 137  K 3.9 3.3* 3.4* 3.4* 4.1 3.5  CL 104 106 107 107 106 105  CO2 18* 18* 20* 20* 20* 20*  GLUCOSE 155* 173* 162* 157* 222* 228*  BUN 31* 36* 39* 41* 46* 53*  CREATININE 1.58* 1.52* 1.54* 1.61* 1.59* 1.83*  CALCIUM 8.9 8.7* 8.2* 8.2* 8.3* 8.3*  MG 1.8 1.8 2.0  --   --   --      GFR: Estimated Creatinine Clearance: 18.9 mL/min (A) (by C-G formula based on SCr of 1.83  mg/dL (H)).  Liver Function Tests: Recent Labs  Lab 08/28/21 2143 08/29/21 0225  AST 21 33  ALT 18 25  ALKPHOS 50 53  BILITOT 1.0 0.9  PROT 6.3* 6.2*  ALBUMIN 3.5 3.3*     CBG: No results for input(s): GLUCAP in the last 168 hours.   Recent Results (from the past 240 hour(s))  Resp Panel by RT-PCR (Flu A&B, Covid) Nasopharyngeal Swab     Status: Abnormal   Collection Time: 08/28/21  9:32 PM   Specimen: Nasopharyngeal Swab; Nasopharyngeal(NP) swabs in vial transport medium  Result Value Ref Range Status   SARS Coronavirus 2 by RT PCR NEGATIVE NEGATIVE Final    Comment: (NOTE) SARS-CoV-2 target nucleic acids are NOT DETECTED.  The SARS-CoV-2 RNA is generally detectable in upper respiratory specimens during the acute phase of infection. The lowest concentration of SARS-CoV-2 viral copies this assay can detect is 138 copies/mL. A negative result does not preclude SARS-Cov-2 infection and should  not be used as the sole basis for treatment or other patient management decisions. A negative result may occur with  improper specimen collection/handling, submission of specimen other than nasopharyngeal swab, presence of viral mutation(s) within the areas targeted by this assay, and inadequate number of viral copies(<138 copies/mL). A negative result must be combined with clinical observations, patient history, and epidemiological information. The expected result is Negative.  Fact Sheet for Patients:  EntrepreneurPulse.com.au  Fact Sheet for Healthcare Providers:  IncredibleEmployment.be  This test is no t yet approved or cleared by the [place] FDA and  has been authorized for detection and/or diagnosis of SARS-CoV-2 by FDA under an Emergency Use Authorization (EUA). This EUA will remain  in effect (meaning this test can be used) for the duration of the COVID-19 declaration under Section 564(b)(1) of the Act, 21 U.S.C.section  360bbb-3(b)(1), unless the authorization is terminated  or revoked sooner.       Influenza A by PCR POSITIVE (A) NEGATIVE Final   Influenza B by PCR NEGATIVE NEGATIVE Final    Comment: (NOTE) The Xpert Xpress SARS-CoV-2/FLU/RSV plus assay is intended as an aid in the diagnosis of influenza from Nasopharyngeal swab specimens and should not be used as a sole basis for treatment. Nasal washings and aspirates are unacceptable for Xpert Xpress SARS-CoV-2/FLU/RSV testing.  Fact Sheet for Patients: EntrepreneurPulse.com.au  Fact Sheet for Healthcare Providers: IncredibleEmployment.be  This test is not yet approved or cleared by the [place] FDA and has been authorized for detection and/or diagnosis of SARS-CoV-2 by FDA under an Emergency Use Authorization (EUA). This EUA will remain in effect (meaning this test can be used) for the duration of the COVID-19 declaration under Section 564(b)(1) of the Act, 21 U.S.C. section 360bbb-3(b)(1), unless the authorization is terminated or revoked.  Performed at [facility], [place] [address] [place], [place] [address]   Blood Culture (routine x 2)     Status: Abnormal   Collection Time: 08/28/21  9:38 PM   Specimen: BLOOD  Result Value Ref Range Status   Specimen Description   Final    BLOOD LEFT ANTECUBITAL Performed at [facility] [address] [place], [place] [address]    Special Requests   Final    BOTTLES DRAWN AEROBIC AND ANAEROBIC Blood Culture results may not be optimal due to an excessive volume of blood received in culture bottles Performed at [facility] [address], [place], [place] [address]    Culture  Setup Time   Final    GRAM POSITIVE COCCI IN CLUSTERS ANAEROBIC BOTTLE ONLY CRITICAL RESULT CALLED TO, READ BACK BY AND VERIFIED WITH: Ramon Dredge [id number] FCP Performed at [facility], [place] [address]  [place], [place] [address]    Culture STAPHYLOCOCCUS EPIDERMIDIS (A)  Final   Report Status 08/31/2021 FINAL  Final   Organism ID, Bacteria STAPHYLOCOCCUS EPIDERMIDIS  Final      Susceptibility   Staphylococcus epidermidis - MIC*    CIPROFLOXACIN 4 RESISTANT Resistant     ERYTHROMYCIN <=0.25 SENSITIVE Sensitive     GENTAMICIN <=0.5 SENSITIVE Sensitive     OXACILLIN >=4 RESISTANT Resistant     TETRACYCLINE <=1 SENSITIVE Sensitive     VANCOMYCIN 1 SENSITIVE Sensitive     TRIMETH/SULFA <=10 SENSITIVE Sensitive     CLINDAMYCIN <=0.25 SENSITIVE Sensitive     RIFAMPIN <=0.5 SENSITIVE Sensitive     Inducible Clindamycin NEGATIVE Sensitive     * STAPHYLOCOCCUS EPIDERMIDIS  Blood Culture (routine x 2)     Status: Abnormal   Collection Time: 08/28/21  9:38 PM   Specimen: BLOOD  Result Value Ref Range Status   Specimen Description   Final    BLOOD RIGHT ANTECUBITAL Performed at [facility] [address], [place], [place] [address]    Special Requests   Final    BOTTLES DRAWN AEROBIC AND ANAEROBIC Blood Culture results may not be optimal due to an excessive volume of blood received in culture bottles Performed at [facility] [address], [place], [place] [address]    Culture  Setup Time   Final    GRAM POSITIVE COCCI IN CLUSTERS IN BOTH AEROBIC AND ANAEROBIC BOTTLES CRITICAL RESULT CALLED TO, READ BACK BY AND VERIFIED WITH: PHARMD LEANNE P [id number] FCP    Culture (A)  Final    STAPHYLOCOCCUS EPIDERMIDIS SUSCEPTIBILITIES PERFORMED ON PREVIOUS CULTURE WITHIN THE LAST 5 DAYS. Performed at [facility], [place] [address], [place], [place] [address]    Report Status 08/31/2021 FINAL  Final  Blood Culture ID Panel (Reflexed)     Status: Abnormal   Collection Time: 08/28/21  9:38 PM  Result Value Ref Range Status   Enterococcus faecalis NOT DETECTED NOT DETECTED Final   Enterococcus Faecium NOT DETECTED NOT DETECTED Final   Listeria  monocytogenes NOT DETECTED NOT DETECTED Final   Staphylococcus species DETECTED (A) NOT DETECTED Final    Comment: CRITICAL RESULT CALLED TO, READ BACK BY AND VERIFIED WITH: PHARMD LEANNE P [phone] [id number] FCP    Staphylococcus aureus (BCID) NOT DETECTED NOT DETECTED Final   Staphylococcus epidermidis DETECTED (A) NOT DETECTED Final    Comment: Methicillin (oxacillin) resistant coagulase negative staphylococcus. Possible blood culture contaminant (unless isolated from more than one blood culture draw or clinical case suggests pathogenicity). No antibiotic treatment is indicated for blood  culture contaminants. CRITICAL RESULT CALLED TO, READ BACK BY AND VERIFIED WITH: PHARMD LEANNE P [phone] [id number] FCP    Staphylococcus lugdunensis NOT DETECTED NOT DETECTED Final   Streptococcus species NOT DETECTED NOT DETECTED Final   Streptococcus agalactiae NOT DETECTED NOT DETECTED Final   Streptococcus pneumoniae NOT DETECTED NOT DETECTED Final   Streptococcus pyogenes NOT DETECTED NOT DETECTED Final   A.calcoaceticus-baumannii NOT DETECTED NOT DETECTED Final   Bacteroides fragilis NOT DETECTED NOT DETECTED Final   Enterobacterales NOT DETECTED NOT DETECTED Final   Enterobacter cloacae complex NOT DETECTED NOT DETECTED Final   Escherichia coli NOT DETECTED NOT DETECTED Final   Klebsiella aerogenes NOT DETECTED NOT DETECTED Final   Klebsiella oxytoca NOT DETECTED NOT DETECTED Final   Klebsiella pneumoniae NOT DETECTED NOT DETECTED Final   Proteus species NOT DETECTED NOT DETECTED Final   Salmonella species NOT DETECTED NOT DETECTED Final   Serratia marcescens NOT DETECTED NOT DETECTED Final   Haemophilus influenzae NOT DETECTED NOT DETECTED Final   Neisseria meningitidis NOT DETECTED NOT DETECTED Final   Pseudomonas aeruginosa NOT DETECTED NOT DETECTED Final   Stenotrophomonas maltophilia NOT DETECTED NOT DETECTED Final   Candida albicans NOT DETECTED NOT DETECTED Final   Candida auris NOT DETECTED NOT  DETECTED Final   Candida glabrata NOT DETECTED NOT DETECTED Final   Candida krusei NOT DETECTED NOT DETECTED Final   Candida parapsilosis NOT DETECTED NOT DETECTED Final   Candida tropicalis NOT DETECTED NOT DETECTED Final   Cryptococcus neoformans/gattii NOT DETECTED NOT DETECTED Final   Methicillin resistance mecA/C DETECTED (A) NOT DETECTED Final  Comment: CRITICAL RESULT CALLED TO, READ BACK BY AND VERIFIED WITH: Ramon Dredge [id number] FCP Performed at [facility], [place] [address] [place], [place] [address]   MRSA Next Gen by PCR, Nasal     Status: None   Collection Time: 08/29/21  2:08 AM   Specimen: Nasal Mucosa; Nasal Swab  Result Value Ref Range Status   MRSA by PCR Next Gen NOT DETECTED NOT DETECTED Final    Comment: (NOTE) The GeneXpert MRSA Assay (FDA approved for NASAL specimens only), is one component of a comprehensive MRSA colonization surveillance program. It is not intended to diagnose MRSA infection nor to guide or monitor treatment for MRSA infections. Test performance is not FDA approved in patients less than 90 years old. Performed at [facility], [place] [address], [place], [place] [address]   Urine Culture     Status: None   Collection Time: 08/29/21  1:20 PM   Specimen: Urine, Random  Result Value Ref Range Status   Specimen Description   Final    URINE, RANDOM Performed at [facility] [address] [place], [place] [address]    Special Requests   Final    NONE Performed at [facility], [place] [address] [place], [place] [address]    Culture   Final    NO GROWTH Performed at [facility], [place] [address], [place], [place] [address]    Report Status 08/30/2021 FINAL  Final  Culture, blood (Routine X 2) w Reflex to ID Panel     Status: None (Preliminary result)   Collection Time: 08/31/21  2:55 PM   Specimen: Left Antecubital; Blood  Result Value Ref Range Status   Specimen  Description   Final    LEFT ANTECUBITAL Performed at [facility] [address] [place], [place] [address]    Special Requests   Final    BOTTLES DRAWN AEROBIC ONLY Blood Culture adequate volume Performed at [facility] [address], [place], [place] [address]    Culture   Final    NO GROWTH 4 DAYS Performed at [facility], [place] [address], [place], [place] [address]    Report Status PENDING  Incomplete  Culture, blood (Routine X 2) w Reflex to ID Panel     Status: None (Preliminary result)   Collection Time: 08/31/21  3:41 PM   Specimen: BLOOD LEFT FOREARM  Result Value Ref Range Status   Specimen Description   Final    BLOOD LEFT FOREARM Performed at [facility] [address], [place], [place] [address]    Special Requests   Final    BOTTLES DRAWN AEROBIC ONLY Blood Culture results may not be optimal due to an inadequate volume of blood received in culture bottles Performed at [facility] [address] [place], [place] [address]    Culture   Final    NO GROWTH 4 DAYS Performed at [facility], [place] [address] [place], [place] [address]    Report Status PENDING  Incomplete          Radiology Studies: DG Shoulder Left Port  Result Date: 09/03/2021 CLINICAL DATA:  [place] [facility] from ICU bed today with left shoulder injury. EXAM: LEFT SHOULDER AP and transscapular Y COMPARISON:  Study of 01/25/2016. FINDINGS: There is osteopenia without evidence of fractures or dislocation. There is preservation of the normal acromiohumeral space. Slight spurring again  noted AC joint. Surrounding soft tissues are grossly unremarkable. IMPRESSION: Osteopenia and degenerative change without evidence of fractures Electronically Signed   By: Telford Nab M.D.   On: 09/03/2021 22:25   DG Shoulder Right Port  Result Date: 09/03/2021 CLINICAL DATA:  [facility] [facility] from ICU bed today with shoulder injury. EXAM:  PORTABLE RIGHT SHOULDER AP and transscapular Y COMPARISON:  None. FINDINGS: There is osteopenia without evidence of fractures. No dislocation is seen. Narrowing and spurring of the [facility] joint is present and calcified synovial pannus in the superior aspect of the AC joint. There are small calcifications along the distal humeral cuff insertion which may be seen with calcific tendinitis or tendinopathy. There is preservation of the normal acromiohumeral space. Visualized right lung fields appear generally clear. No displaced fracture is seen of the visualized right ribs. IMPRESSION: Osteopenia and degenerative change without evidence of fractures. Additional chronic findings. Electronically Signed   By: Telford Nab M.D.   On: 09/03/2021 22:35        Scheduled Meds:  amiodarone  200 mg Oral BID   apixaban  2.5 mg Oral BID   Chlorhexidine Gluconate Cloth  6 each Topical Q0600   dextromethorphan-guaiFENesin  1 tablet Oral BID   fluticasone  2 spray Each Nare Daily   furosemide  40 mg Oral Daily   levalbuterol  0.63 mg Nebulization BID   loratadine  10 mg Oral Daily   mouth rinse  15 mL Mouth Rinse BID   melatonin  5 mg Oral Once   metoprolol succinate  25 mg Oral Daily   pantoprazole  40 mg Oral Q0600   predniSONE  40 mg Oral QAC breakfast   rosuvastatin  10 mg Oral Daily   tamsulosin  0.4 mg Oral QPC supper   Continuous Infusions:  sodium chloride Stopped (09/01/21 0807)     LOS: 7 days        Hosie Poisson, MD [facility]   To contact the attending provider between 7A-7P or the covering provider during after hours 7P-7A, please log into the web site [email or web address] and access using universal [place] password for that web site. If you do not have the password, please call the hospital operator.  09/04/2021, 12:04 PM

## 2021-09-05 DIAGNOSIS — I480 Paroxysmal atrial fibrillation: Secondary | ICD-10-CM | POA: Diagnosis not present

## 2021-09-05 DIAGNOSIS — J101 Influenza due to other identified influenza virus with other respiratory manifestations: Secondary | ICD-10-CM | POA: Diagnosis not present

## 2021-09-05 DIAGNOSIS — R0602 Shortness of breath: Secondary | ICD-10-CM | POA: Diagnosis not present

## 2021-09-05 LAB — CULTURE, BLOOD (ROUTINE X 2)
Culture: NO GROWTH
Culture: NO GROWTH
Special Requests: ADEQUATE

## 2021-09-05 LAB — BASIC METABOLIC PANEL
Anion gap: 8 (ref 5–15)
BUN: 61 mg/dL — ABNORMAL HIGH (ref 8–23)
CO2: 23 mmol/L (ref 22–32)
Calcium: 8.2 mg/dL — ABNORMAL LOW (ref 8.9–10.3)
Chloride: 104 mmol/L (ref 98–111)
Creatinine, Ser: 1.95 mg/dL — ABNORMAL HIGH (ref 0.61–1.24)
GFR, Estimated: 32 mL/min — ABNORMAL LOW (ref >60)
Glucose, Bld: 228 mg/dL — ABNORMAL HIGH (ref 70–99)
Potassium: 3.2 mmol/L — ABNORMAL LOW (ref 3.5–5.1)
Sodium: 135 mmol/L (ref 135–145)

## 2021-09-05 LAB — CBC
HCT: 39.7 % (ref 39.0–52.0)
Hemoglobin: 13.1 g/dL (ref 13.0–17.0)
MCH: 29.7 pg (ref 26.0–34.0)
MCHC: 33 g/dL (ref 30.0–36.0)
MCV: 90 fL (ref 80.0–100.0)
Platelets: 191 10*3/uL (ref 150–400)
RBC: 4.41 MIL/uL (ref 4.22–5.81)
RDW: 14.2 % (ref 11.5–15.5)
WBC: 9.6 10*3/uL (ref 4.0–10.5)
nRBC: 0 % (ref 0.0–0.2)

## 2021-09-05 MED ORDER — PREDNISONE 20 MG PO TABS
20.0000 mg | ORAL_TABLET | Freq: Every day | ORAL | Status: AC
  Administered 2021-09-06: 20 mg via ORAL
  Filled 2021-09-05: qty 1

## 2021-09-05 MED ORDER — HYDRALAZINE HCL 25 MG PO TABS: 25.0000 mg | ORAL_TABLET | Freq: Three times a day (TID) | ORAL | Status: DC | PRN

## 2021-09-05 MED ORDER — POTASSIUM CHLORIDE CRYS ER 20 MEQ PO TBCR
40.0000 meq | EXTENDED_RELEASE_TABLET | Freq: Once | ORAL | Status: AC
  Administered 2021-09-05: 11:00:00 40 meq via ORAL
  Filled 2021-09-05: qty 2

## 2021-09-05 NOTE — Progress Notes (Signed)
Pt stable at time of transfer to [facility]. Son [name] was at pt's bedside.

## 2021-09-05 NOTE — Progress Notes (Signed)
PROGRESS NOTE    Casey Lee  WGN:[id number] DOB: 1927-11-09 DOA: 08/28/2021 PCP: [name], MD   Chief Complaint  Patient presents with   Fever   Weakness    Brief Narrative:  87 year old gentleman with prior history of temporal arteritis on prednisone, stage IIIa CKD, hyperlipidemia, atrial fibrillation presents with cough fevers and chills.  Patient was found to to have influenza A .  He ws started on steroids, tamiflu. Hospital course complicated by staph epidermidis bacteremia. Pt currently on IV vancomycin and ID on board.    Assessment & Plan:   Principal Problem:   Sepsis ([place]) Active Problems:   Essential hypertension   HLD (hyperlipidemia)   Temporal arteritis (HCC)   PAF (paroxysmal atrial fibrillation) (HCC)   CAP (community acquired pneumonia)   Influenza A   Generalized weakness   Bacteremia due to Gram-positive bacteria   MRSE bacteremia   Hypokalemia   Sepsis secondary to pneumonia/staph epidermidis bacteremia/influenza A infection Improving sepsis physiology. Patient currently afebrile and leukocytosis is present.  Recheck CBC IN am.  Initial blood cultures were positive for staph epidermidis, repeat blood cultures have been negative so far ID consulted, recommendations for the duration of antibiotics  given. Completed the course of IV Vancomycin.  2D echocardiogram is negative for vegetations.  Influenza A positive infection/acute respiratory failure with hypoxia probably secondary to influenza A infection Completed the course of Tamiflu and continue with symptomatic management with bronchodilators Flonase PPI. Wheezing has improved with steroids. Transitioned to prednisone , plan for a quick taper to home maintenance dose of prednisone He has been weaned off oxygen.  Plan to check ambulating oxygen levels prior to discharge.  He continues to be dyspneic  and tachypnic on talking but better than the last 2 days.      Atrial fibrillation  with RVR probably secondary to acute infection Rate well controlled with metoprolol and amiodarone TSH within normal limits Echocardiogram shows left ventricular ejection fraction of 45 to 50%, with lateral hypokinesis, basal inferior wall hypokinesis.  Patient transitioned from IV amiodarone to oral amiodarone 200 mg twice daily and Toprol. Patient is on Eliquis for anticoagulation. Appreciate cardiology recommendations.  Cardiology plans to repeat another echo as outpatient.      History of temporal arteritis Was on prednisone 5 mg daily.  Currently on tapering steroids  Hyperlipidemia Continue with statin   Acute metabolic encephalopathy Probably secondary to sepsis, pneumonia, influenza infection. Appears to have resolved. Pt is alert and oriented to person and place and answering questions appropriately    History of chronic thrombocytopenia Platelet counts have improved    Stage IIIa CKD Continue to monitor. Slight bump in creatinine to 1.9 Lasix held, recheck creatinine in am.     Hypokalemia Replaced.  Repeat level in the morning   Uncontrolled hypertension Patient is on metoprolol 25 mg daily, hydralazine 25 mg 3 times daily as needed added for better control.   DVT prophylaxis: Eliquis Code Status: Full code Family Communication: discussed the plan with family at bedside   Disposition:   Status is: Inpatient  Remains inpatient appropriate because: unsafe plan , for SNF.        Consultants:  Cardiology Infectious disease  Procedures: Echocardiogram Antimicrobials:  Antibiotics Given (last 72 hours)     Date/Time Action Medication Dose Rate   09/02/21 2134 Given   oseltamivir (TAMIFLU) capsule 30 mg 30 mg    09/03/21 0028 New Bag/Given   vancomycin (VANCOREADY) IVPB 750 mg/150 mL 750 mg  150 mL/hr   09/04/21 0204 New Bag/Given   vancomycin (VANCOREADY) IVPB 750 mg/150 mL 750 mg 150 mL/hr        Subjective:  Patient is on room  air Denies chest pain, shortness of breath has improved, no nausea or vomiting. Objective: Vitals:   09/05/21 0400 09/05/21 0500 09/05/21 0700 09/05/21 0800  BP:   (!) 152/75 (!) 181/99  Pulse: 78 75 88 82  Resp: (!) 24 15 (!) 26 19  Temp: 97.6 F (36.4 C)  97.6 F (36.4 C)   TempSrc: Axillary  Axillary   SpO2: 93% 97% 96% 100%  Weight:  50.9 kg    Height:        Intake/Output Summary (Last 24 hours) at 09/05/2021 1111 Last data filed at 09/04/2021 1800 Gross per 24 hour  Intake 240 ml  Output 1000 ml  Net -760 ml    Filed Weights   09/03/21 0457 09/04/21 0500 09/05/21 0500  Weight: 51.5 kg 50.9 kg 50.9 kg    Examination:  General exam: Elderly gentleman on room air not in any kind of distress Respiratory system: Diminished air entry at bases, scattered wheezing posteriorly, tachypnea present Cardiovascular system: S1 & S2 heard, irregularly irregular, no JVD no pedal edema. Gastrointestinal system: Abdomen is nondistended, soft and nontender. Normal bowel sounds heard. Central nervous system: Alert and oriented to person and place only. No focal neurological deficits. Extremities: Symmetric 5 x 5 power. Skin: No rashes, lesions or ulcers Psychiatry:  Mood & affect appropriate.       Data Reviewed: I have personally reviewed following labs and imaging studies  CBC: Recent Labs  Lab 08/30/21 0257 08/31/21 0239 09/01/21 0250 09/02/21 1156 09/05/21 0255  WBC 18.8* 15.4* 11.7* 13.0* 9.6  NEUTROABS 16.7* 13.6* 9.9* 11.3*  --   HGB 13.4 13.0 12.9* 13.7 13.1  HCT 41.1 40.3 40.7 41.1 39.7  MCV 92.4 94.2 94.4 90.3 90.0  PLT 151 137* 144* 186 191     Basic Metabolic Panel: Recent Labs  Lab 08/30/21 0257 08/31/21 0239 09/01/21 0250 09/02/21 0236 09/03/21 0239 09/04/21 0257 09/05/21 0255  NA 138 137 135 137 137 137 135  K 3.9 3.3* 3.4* 3.4* 4.1 3.5 3.2*  CL 104 106 107 107 106 105 104  CO2 18* 18* 20* 20* 20* 20* 23  GLUCOSE 155* 173* 162* 157* 222*  228* 228*  BUN 31* 36* 39* 41* 46* 53* 61*  CREATININE 1.58* 1.52* 1.54* 1.61* 1.59* 1.83* 1.95*  CALCIUM 8.9 8.7* 8.2* 8.2* 8.3* 8.3* 8.2*  MG 1.8 1.8 2.0  --   --   --   --      GFR: Estimated Creatinine Clearance: 17.8 mL/min (A) (by C-G formula based on SCr of 1.95 mg/dL (H)).  Liver Function Tests: No results for input(s): AST, ALT, ALKPHOS, BILITOT, PROT, ALBUMIN in the last 168 hours.   CBG: No results for input(s): GLUCAP in the last 168 hours.   Recent Results (from the past 240 hour(s))  Resp Panel by RT-PCR (Flu A&B, Covid) Nasopharyngeal Swab     Status: Abnormal   Collection Time: 08/28/21  9:32 PM   Specimen: Nasopharyngeal Swab; Nasopharyngeal(NP) swabs in vial transport medium  Result Value Ref Range Status   SARS Coronavirus 2 by RT PCR NEGATIVE NEGATIVE Final    Comment: (NOTE) SARS-CoV-2 target nucleic acids are NOT DETECTED.  The SARS-CoV-2 RNA is generally detectable in upper respiratory specimens during the acute phase of infection. The lowest concentration of SARS-CoV-2  viral copies this assay can detect is 138 copies/mL. A negative result does not preclude SARS-Cov-2 infection and should not be used as the sole basis for treatment or other patient management decisions. A negative result may occur with  improper specimen collection/handling, submission of specimen other than nasopharyngeal swab, presence of viral mutation(s) within the areas targeted by this assay, and inadequate number of viral copies(<138 copies/mL). A negative result must be combined with clinical observations, patient history, and epidemiological information. The expected result is Negative.  Fact Sheet for Patients:  EntrepreneurPulse.com.au  Fact Sheet for Healthcare Providers:  IncredibleEmployment.be  This test is no t yet approved or cleared by the [place] FDA and  has been authorized for detection and/or diagnosis of SARS-CoV-2  by FDA under an Emergency Use Authorization (EUA). This EUA will remain  in effect (meaning this test can be used) for the duration of the COVID-19 declaration under Section 564(b)(1) of the Act, 21 U.S.C.section 360bbb-3(b)(1), unless the authorization is terminated  or revoked sooner.       Influenza A by PCR POSITIVE (A) NEGATIVE Final   Influenza B by PCR NEGATIVE NEGATIVE Final    Comment: (NOTE) The Xpert Xpress SARS-CoV-2/FLU/RSV plus assay is intended as an aid in the diagnosis of influenza from Nasopharyngeal swab specimens and should not be used as a sole basis for treatment. Nasal washings and aspirates are unacceptable for Xpert Xpress SARS-CoV-2/FLU/RSV testing.  Fact Sheet for Patients: EntrepreneurPulse.com.au  Fact Sheet for Healthcare Providers: IncredibleEmployment.be  This test is not yet approved or cleared by the [place] FDA and has been authorized for detection and/or diagnosis of SARS-CoV-2 by FDA under an Emergency Use Authorization (EUA). This EUA will remain in effect (meaning this test can be used) for the duration of the COVID-19 declaration under Section 564(b)(1) of the Act, 21 U.S.C. section 360bbb-3(b)(1), unless the authorization is terminated or revoked.  Performed at [facility], [place] [address], [place], [place] [address]   Blood Culture (routine x 2)     Status: Abnormal   Collection Time: 08/28/21  9:38 PM   Specimen: BLOOD  Result Value Ref Range Status   Specimen Description   Final    BLOOD LEFT ANTECUBITAL Performed at [facility] [address], [place], [place] [address]    Special Requests   Final    BOTTLES DRAWN AEROBIC AND ANAEROBIC Blood Culture results may not be optimal due to an excessive volume of blood received in culture bottles Performed at [facility] [address] [place], [place] [address]    Culture  Setup  Time   Final    GRAM POSITIVE COCCI IN CLUSTERS ANAEROBIC BOTTLE ONLY CRITICAL RESULT CALLED TO, READ BACK BY AND VERIFIED WITH: Ramon Dredge [id number] FCP Performed at [facility], [place] [address], [place], [place] [address]    Culture STAPHYLOCOCCUS EPIDERMIDIS (A)  Final   Report Status 08/31/2021 FINAL  Final   Organism ID, Bacteria STAPHYLOCOCCUS EPIDERMIDIS  Final      Susceptibility   Staphylococcus epidermidis - MIC*    CIPROFLOXACIN 4 RESISTANT Resistant     ERYTHROMYCIN <=0.25 SENSITIVE Sensitive     GENTAMICIN <=0.5 SENSITIVE Sensitive     OXACILLIN >=4 RESISTANT Resistant     TETRACYCLINE <=1 SENSITIVE Sensitive     VANCOMYCIN 1 SENSITIVE Sensitive     TRIMETH/SULFA <=10 SENSITIVE Sensitive     CLINDAMYCIN <=0.25 SENSITIVE Sensitive  RIFAMPIN <=0.5 SENSITIVE Sensitive     Inducible Clindamycin NEGATIVE Sensitive     * STAPHYLOCOCCUS EPIDERMIDIS  Blood Culture (routine x 2)     Status: Abnormal   Collection Time: 08/28/21  9:38 PM   Specimen: BLOOD  Result Value Ref Range Status   Specimen Description   Final    BLOOD RIGHT ANTECUBITAL Performed at [facility] [address], [place], [place] [address]    Special Requests   Final    BOTTLES DRAWN AEROBIC AND ANAEROBIC Blood Culture results may not be optimal due to an excessive volume of blood received in culture bottles Performed at [facility] [address], [place], [place] [address]    Culture  Setup Time   Final    GRAM POSITIVE COCCI IN CLUSTERS IN BOTH AEROBIC AND ANAEROBIC BOTTLES CRITICAL RESULT CALLED TO, READ BACK BY AND VERIFIED WITH: PHARMD LEANNE P [id number] FCP    Culture (A)  Final    STAPHYLOCOCCUS EPIDERMIDIS SUSCEPTIBILITIES PERFORMED ON PREVIOUS CULTURE WITHIN THE LAST 5 DAYS. Performed at [facility], [place] [address] [place], [place] [address]    Report Status 08/31/2021 FINAL  Final  Blood Culture ID Panel (Reflexed)      Status: Abnormal   Collection Time: 08/28/21  9:38 PM  Result Value Ref Range Status   Enterococcus faecalis NOT DETECTED NOT DETECTED Final   Enterococcus Faecium NOT DETECTED NOT DETECTED Final   Listeria monocytogenes NOT DETECTED NOT DETECTED Final   Staphylococcus species DETECTED (A) NOT DETECTED Final    Comment: CRITICAL RESULT CALLED TO, READ BACK BY AND VERIFIED WITH: PHARMD LEANNE P [phone] [id number] FCP    Staphylococcus aureus (BCID) NOT DETECTED NOT DETECTED Final   Staphylococcus epidermidis DETECTED (A) NOT DETECTED Final    Comment: Methicillin (oxacillin) resistant coagulase negative staphylococcus. Possible blood culture contaminant (unless isolated from more than one blood culture draw or clinical case suggests pathogenicity). No antibiotic treatment is indicated for blood  culture contaminants. CRITICAL RESULT CALLED TO, READ BACK BY AND VERIFIED WITH: PHARMD LEANNE P [phone] [id number] FCP    Staphylococcus lugdunensis NOT DETECTED NOT DETECTED Final   Streptococcus species NOT DETECTED NOT DETECTED Final   Streptococcus agalactiae NOT DETECTED NOT DETECTED Final   Streptococcus pneumoniae NOT DETECTED NOT DETECTED Final   Streptococcus pyogenes NOT DETECTED NOT DETECTED Final   A.calcoaceticus-baumannii NOT DETECTED NOT DETECTED Final   Bacteroides fragilis NOT DETECTED NOT DETECTED Final   Enterobacterales NOT DETECTED NOT DETECTED Final   Enterobacter cloacae complex NOT DETECTED NOT DETECTED Final   Escherichia coli NOT DETECTED NOT DETECTED Final   Klebsiella aerogenes NOT DETECTED NOT DETECTED Final   Klebsiella oxytoca NOT DETECTED NOT DETECTED Final   Klebsiella pneumoniae NOT DETECTED NOT DETECTED Final   Proteus species NOT DETECTED NOT DETECTED Final   Salmonella species NOT DETECTED NOT DETECTED Final   Serratia marcescens NOT DETECTED NOT DETECTED Final   Haemophilus influenzae NOT DETECTED NOT DETECTED Final   Neisseria meningitidis NOT DETECTED NOT DETECTED  Final   Pseudomonas aeruginosa NOT DETECTED NOT DETECTED Final   Stenotrophomonas maltophilia NOT DETECTED NOT DETECTED Final   Candida albicans NOT DETECTED NOT DETECTED Final   Candida auris NOT DETECTED NOT DETECTED Final   Candida glabrata NOT DETECTED NOT DETECTED Final   Candida krusei NOT DETECTED NOT DETECTED Final   Candida parapsilosis NOT DETECTED NOT DETECTED Final   Candida tropicalis NOT DETECTED NOT DETECTED Final  Cryptococcus neoformans/gattii NOT DETECTED NOT DETECTED Final   Methicillin resistance mecA/C DETECTED (A) NOT DETECTED Final    Comment: CRITICAL RESULT CALLED TO, READ BACK BY AND VERIFIED WITH: Ramon Dredge [id number] FCP Performed at [facility], [place] [address] [place], [place] [address]   MRSA Next Gen by PCR, Nasal     Status: None   Collection Time: 08/29/21  2:08 AM   Specimen: Nasal Mucosa; Nasal Swab  Result Value Ref Range Status   MRSA by PCR Next Gen NOT DETECTED NOT DETECTED Final    Comment: (NOTE) The GeneXpert MRSA Assay (FDA approved for NASAL specimens only), is one component of a comprehensive MRSA colonization surveillance program. It is not intended to diagnose MRSA infection nor to guide or monitor treatment for MRSA infections. Test performance is not FDA approved in patients less than 62 years old. Performed at [facility], [place] [address], [place], [place] [address]   Urine Culture     Status: None   Collection Time: 08/29/21  1:20 PM   Specimen: Urine, Random  Result Value Ref Range Status   Specimen Description   Final    URINE, RANDOM Performed at [facility] [address], [place], [place] [address]    Special Requests   Final    NONE Performed at [facility], [place] [address] [place], [place] [address]    Culture   Final    NO GROWTH Performed at [facility], [place] [address], [place], [place] [address]    Report Status 08/30/2021 FINAL   Final  Culture, blood (Routine X 2) w Reflex to ID Panel     Status: None   Collection Time: 08/31/21  2:55 PM   Specimen: Left Antecubital; Blood  Result Value Ref Range Status   Specimen Description   Final    LEFT ANTECUBITAL Performed at [facility] [address] [place], [place] [address]    Special Requests   Final    BOTTLES DRAWN AEROBIC ONLY Blood Culture adequate volume Performed at [facility] [address], [place], [place] [address]    Culture   Final    NO GROWTH 5 DAYS Performed at [facility], [place] [address] [place], [place] [address]    Report Status 09/05/2021 FINAL  Final  Culture, blood (Routine X 2) w Reflex to ID Panel     Status: None   Collection Time: 08/31/21  3:41 PM   Specimen: BLOOD LEFT FOREARM  Result Value Ref Range Status   Specimen Description   Final    BLOOD LEFT FOREARM Performed at [facility] [address] [place], [place] [address]    Special Requests   Final    BOTTLES DRAWN AEROBIC ONLY Blood Culture results may not be optimal due to an inadequate volume of blood received in culture bottles Performed at [facility] [address], [place], [place] [address]    Culture   Final    NO GROWTH 5 DAYS Performed at [facility], [place] [address] [place], [place] [address]    Report Status 09/05/2021 FINAL  Final          Radiology Studies: DG Shoulder Left Port  Result Date: 09/03/2021 CLINICAL DATA:  [place] from ICU bed today with left shoulder injury. EXAM: LEFT SHOULDER AP and transscapular Y COMPARISON:  Study of 01/25/2016. FINDINGS: There is  osteopenia without evidence of fractures or dislocation. There is preservation of the normal acromiohumeral space. Slight spurring again noted AC joint. Surrounding soft tissues are grossly unremarkable. IMPRESSION: Osteopenia and degenerative change without evidence of fractures Electronically  Signed   By: Telford Nab M.D.   On: 09/03/2021 22:25   DG Shoulder Right Port  Result Date: 09/03/2021 CLINICAL DATA:  [place] from ICU bed today with shoulder injury. EXAM: PORTABLE RIGHT SHOULDER AP and transscapular Y COMPARISON:  None. FINDINGS: There is osteopenia without evidence of fractures. No dislocation is seen. Narrowing and spurring of the [facility] joint is present and calcified synovial pannus in the superior aspect of the AC joint. There are small calcifications along the distal humeral cuff insertion which may be seen with calcific tendinitis or tendinopathy. There is preservation of the normal acromiohumeral space. Visualized right lung fields appear generally clear. No displaced fracture is seen of the visualized right ribs. IMPRESSION: Osteopenia and degenerative change without evidence of fractures. Additional chronic findings. Electronically Signed   By: Telford Nab M.D.   On: 09/03/2021 22:35        Scheduled Meds:  amiodarone  200 mg Oral BID   apixaban  2.5 mg Oral BID   Chlorhexidine Gluconate Cloth  6 each Topical Q0600   dextromethorphan-guaiFENesin  1 tablet Oral BID   fluticasone  2 spray Each Nare Daily   levalbuterol  0.63 mg Nebulization BID   loratadine  10 mg Oral Daily   mouth rinse  15 mL Mouth Rinse BID   melatonin  5 mg Oral Once   metoprolol succinate  25 mg Oral Daily   pantoprazole  40 mg Oral Q0600   [START ON 09/06/2021] predniSONE  20 mg Oral QAC breakfast   rosuvastatin  10 mg Oral Daily   tamsulosin  0.4 mg Oral QPC supper   Continuous Infusions:  sodium chloride Stopped (09/01/21 0807)     LOS: 8 days        Hosie Poisson, MD [facility]   To contact the attending provider between 7A-7P or the covering provider during after hours 7P-7A, please log into the web site [email or web address] and access using universal [place] password for that web site. If you do not have the password, please call the hospital operator.  09/05/2021,  11:11 AM

## 2021-09-06 DIAGNOSIS — G9341 Metabolic encephalopathy: Secondary | ICD-10-CM | POA: Diagnosis not present

## 2021-09-06 DIAGNOSIS — J189 Pneumonia, unspecified organism: Secondary | ICD-10-CM | POA: Diagnosis not present

## 2021-09-06 DIAGNOSIS — E569 Vitamin deficiency, unspecified: Secondary | ICD-10-CM | POA: Diagnosis not present

## 2021-09-06 DIAGNOSIS — M6281 Muscle weakness (generalized): Secondary | ICD-10-CM | POA: Diagnosis not present

## 2021-09-06 DIAGNOSIS — E876 Hypokalemia: Secondary | ICD-10-CM | POA: Diagnosis not present

## 2021-09-06 DIAGNOSIS — E785 Hyperlipidemia, unspecified: Secondary | ICD-10-CM | POA: Diagnosis not present

## 2021-09-06 DIAGNOSIS — E440 Moderate protein-calorie malnutrition: Secondary | ICD-10-CM | POA: Diagnosis not present

## 2021-09-06 DIAGNOSIS — Z09 Encounter for follow-up examination after completed treatment for conditions other than malignant neoplasm: Secondary | ICD-10-CM | POA: Diagnosis not present

## 2021-09-06 DIAGNOSIS — R300 Dysuria: Secondary | ICD-10-CM | POA: Diagnosis not present

## 2021-09-06 DIAGNOSIS — I4892 Unspecified atrial flutter: Secondary | ICD-10-CM | POA: Diagnosis not present

## 2021-09-06 DIAGNOSIS — Z7401 Bed confinement status: Secondary | ICD-10-CM | POA: Diagnosis not present

## 2021-09-06 DIAGNOSIS — W19XXXA Unspecified fall, initial encounter: Secondary | ICD-10-CM | POA: Diagnosis not present

## 2021-09-06 DIAGNOSIS — R0609 Other forms of dyspnea: Secondary | ICD-10-CM | POA: Diagnosis not present

## 2021-09-06 DIAGNOSIS — A419 Sepsis, unspecified organism: Secondary | ICD-10-CM | POA: Diagnosis not present

## 2021-09-06 DIAGNOSIS — M25512 Pain in left shoulder: Secondary | ICD-10-CM | POA: Diagnosis not present

## 2021-09-06 DIAGNOSIS — R7881 Bacteremia: Secondary | ICD-10-CM | POA: Diagnosis not present

## 2021-09-06 DIAGNOSIS — I517 Cardiomegaly: Secondary | ICD-10-CM | POA: Diagnosis not present

## 2021-09-06 DIAGNOSIS — Z8701 Personal history of pneumonia (recurrent): Secondary | ICD-10-CM | POA: Diagnosis not present

## 2021-09-06 DIAGNOSIS — I429 Cardiomyopathy, unspecified: Secondary | ICD-10-CM | POA: Diagnosis not present

## 2021-09-06 DIAGNOSIS — D696 Thrombocytopenia, unspecified: Secondary | ICD-10-CM | POA: Diagnosis not present

## 2021-09-06 DIAGNOSIS — I350 Nonrheumatic aortic (valve) stenosis: Secondary | ICD-10-CM | POA: Diagnosis not present

## 2021-09-06 DIAGNOSIS — R0602 Shortness of breath: Secondary | ICD-10-CM | POA: Diagnosis not present

## 2021-09-06 DIAGNOSIS — Z79899 Other long term (current) drug therapy: Secondary | ICD-10-CM | POA: Diagnosis not present

## 2021-09-06 DIAGNOSIS — R10819 Abdominal tenderness, unspecified site: Secondary | ICD-10-CM | POA: Diagnosis not present

## 2021-09-06 DIAGNOSIS — J1108 Influenza due to unidentified influenza virus with specified pneumonia: Secondary | ICD-10-CM | POA: Diagnosis not present

## 2021-09-06 DIAGNOSIS — I480 Paroxysmal atrial fibrillation: Secondary | ICD-10-CM | POA: Diagnosis not present

## 2021-09-06 DIAGNOSIS — Z8709 Personal history of other diseases of the respiratory system: Secondary | ICD-10-CM | POA: Diagnosis not present

## 2021-09-06 DIAGNOSIS — D72829 Elevated white blood cell count, unspecified: Secondary | ICD-10-CM | POA: Diagnosis not present

## 2021-09-06 DIAGNOSIS — S4992XA Unspecified injury of left shoulder and upper arm, initial encounter: Secondary | ICD-10-CM | POA: Diagnosis not present

## 2021-09-06 DIAGNOSIS — I10 Essential (primary) hypertension: Secondary | ICD-10-CM | POA: Diagnosis not present

## 2021-09-06 DIAGNOSIS — G9340 Encephalopathy, unspecified: Secondary | ICD-10-CM | POA: Diagnosis not present

## 2021-09-06 DIAGNOSIS — N390 Urinary tract infection, site not specified: Secondary | ICD-10-CM | POA: Diagnosis not present

## 2021-09-06 DIAGNOSIS — R531 Weakness: Secondary | ICD-10-CM | POA: Diagnosis not present

## 2021-09-06 DIAGNOSIS — R410 Disorientation, unspecified: Secondary | ICD-10-CM | POA: Diagnosis not present

## 2021-09-06 DIAGNOSIS — R918 Other nonspecific abnormal finding of lung field: Secondary | ICD-10-CM | POA: Diagnosis not present

## 2021-09-06 DIAGNOSIS — Z87440 Personal history of urinary (tract) infections: Secondary | ICD-10-CM | POA: Diagnosis not present

## 2021-09-06 LAB — CBC WITH DIFFERENTIAL/PLATELET
Abs Immature Granulocytes: 0.13 10*3/uL — ABNORMAL HIGH (ref 0.00–0.07)
Basophils Absolute: 0 10*3/uL (ref 0.0–0.1)
Basophils Relative: 0 %
Eosinophils Absolute: 0 10*3/uL (ref 0.0–0.5)
Eosinophils Relative: 0 %
HCT: 40.7 % (ref 39.0–52.0)
Hemoglobin: 13.3 g/dL (ref 13.0–17.0)
Immature Granulocytes: 1 %
Lymphocytes Relative: 8 %
Lymphs Abs: 0.8 10*3/uL (ref 0.7–4.0)
MCH: 29.4 pg (ref 26.0–34.0)
MCHC: 32.7 g/dL (ref 30.0–36.0)
MCV: 90 fL (ref 80.0–100.0)
Monocytes Absolute: 0.7 10*3/uL (ref 0.1–1.0)
Monocytes Relative: 7 %
Neutro Abs: 8.1 10*3/uL — ABNORMAL HIGH (ref 1.7–7.7)
Neutrophils Relative %: 84 %
Platelets: 191 10*3/uL (ref 150–400)
RBC: 4.52 MIL/uL (ref 4.22–5.81)
RDW: 14.4 % (ref 11.5–15.5)
WBC: 9.7 10*3/uL (ref 4.0–10.5)
nRBC: 0 % (ref 0.0–0.2)

## 2021-09-06 LAB — RESP PANEL BY RT-PCR (FLU A&B, COVID) ARPGX2
Influenza A by PCR: NEGATIVE
Influenza B by PCR: NEGATIVE
SARS Coronavirus 2 by RT PCR: NEGATIVE

## 2021-09-06 LAB — BASIC METABOLIC PANEL
Anion gap: 5 (ref 5–15)
BUN: 57 mg/dL — ABNORMAL HIGH (ref 8–23)
CO2: 24 mmol/L (ref 22–32)
Calcium: 8.3 mg/dL — ABNORMAL LOW (ref 8.9–10.3)
Chloride: 107 mmol/L (ref 98–111)
Creatinine, Ser: 1.69 mg/dL — ABNORMAL HIGH (ref 0.61–1.24)
GFR, Estimated: 38 mL/min — ABNORMAL LOW (ref >60)
Glucose, Bld: 301 mg/dL — ABNORMAL HIGH (ref 70–99)
Potassium: 3.7 mmol/L (ref 3.5–5.1)
Sodium: 136 mmol/L (ref 135–145)

## 2021-09-06 MED ORDER — PREDNISONE 5 MG PO TABS: 5.0000 mg | ORAL_TABLET | Freq: Every day | ORAL | Status: DC

## 2021-09-06 MED ORDER — FLUTICASONE PROPIONATE 50 MCG/ACT NA SUSP: 2.0000 | Freq: Every day | NASAL | 2 refills | Status: AC

## 2021-09-06 MED ORDER — AMIODARONE HCL 200 MG PO TABS
200.0000 mg | ORAL_TABLET | Freq: Two times a day (BID) | ORAL | 1 refills | Status: AC
Start: 2021-09-06 — End: ?

## 2021-09-06 NOTE — Progress Notes (Signed)
Occupational Therapy Treatment Patient Details Name: Casey Lee MRN: [id number] DOB: 15-May-1928 Today's Date: 09/06/2021   History of present illness 87 year old male presented 08/28/21 with cough,  fever, chills, Flu A.  COVID 19 PCR negative. In ED patient noted to be hypotensive systolic blood pressures  A. fib with RVR, chest x-ray consistent with retrosternal densities. PMH including temporal arteritis on prednisone, CKD stage IIIa, hyperlipidemia, A. fib.   OT comments  Patient was noted to be fatigued after session with PT shortly before this session. Patient was motivated to participate. Patient participated in grooming tasks standing at sink for 5 mins with RW with continued cues for sequencing for tasks. Patient was noted to be quick to fatigue during session. Patient would continue to benefit from skilled OT services at this time while admitted and after d/c to address noted deficits in order to improve overall safety and independence in ADLs.     Recommendations for follow up therapy are one component of a multi-disciplinary discharge planning process, led by the attending physician.  Recommendations may be updated based on patient status, additional functional criteria and insurance authorization.    Follow Up Recommendations  Skilled nursing-short term rehab (<3 hours/day)    Assistance Recommended at Discharge Frequent or constant Supervision/Assistance  Equipment Recommendations  Tub/shower seat    Recommendations for Other Services      Precautions / Restrictions Precautions Precautions: Fall Precaution Comments: Watch SpO2 and RR Restrictions Weight Bearing Restrictions: No       Mobility Bed Mobility               General bed mobility comments: pt OOB in recliner    Transfers Overall transfer level: Needs assistance Equipment used: Rolling walker (2 wheels) Transfers: Sit to/from Stand Sit to Stand: Min guard;Min assist   Step pivot  transfers: Min assist             Balance Overall balance assessment: Needs assistance;Mild deficits observed, not formally tested                                         ADL either performed or assessed with clinical judgement   ADL Overall ADL's : Needs assistance/impaired     Grooming: Standing;Min guard;Cueing for sequencing;Cueing for safety Grooming Details (indicate cue type and reason): patient was noted to have increased difficulty with clearing toothbrush from ziplock bag with min A needed to set up toothbrush and toothpaste out of bag.                               General ADL Comments: patient needed step by step cues for sequencing of task for patient to transition from sink back to recliner when patient reported fatigue. patient is very motivated to participate in sessions.    Extremity/Trunk Assessment              Vision       Perception     Praxis      Cognition Arousal/Alertness: Awake/alert Behavior During Therapy: WFL for tasks assessed/performed Overall Cognitive Status: Within Functional Limits for tasks assessed                                 General Comments: patient noted to have difficulty with sequencing  of tasks, patient unable to process more than one cue at a time. patient unable to sequence how to remove toothbrush from ziplock bag with increased time on this date.          Exercises     Shoulder Instructions       General Comments      Pertinent Vitals/ Pain       Pain Assessment: No/denies pain  Home Living                                          Prior Functioning/Environment              Frequency  Min 2X/week        Progress Toward Goals  OT Goals(current goals can now be found in the care plan section)  Progress towards OT goals: Progressing toward goals     Plan Discharge plan remains appropriate    Co-evaluation                  AM-PAC OT "6 Clicks" Daily Activity     Outcome Measure   Help from another person eating meals?: None Help from another person taking care of personal grooming?: A Little Help from another person toileting, which includes using toliet, bedpan, or urinal?: A Little Help from another person bathing (including washing, rinsing, drying)?: A Little Help from another person to put on and taking off regular upper body clothing?: A Little Help from another person to put on and taking off regular lower body clothing?: A Little 6 Click Score: 19    End of Session Equipment Utilized During Treatment: Rolling walker (2 wheels)  OT Visit Diagnosis: Unsteadiness on feet (R26.81);Other abnormalities of gait and mobility (R26.89);Muscle weakness (generalized) (M62.81)   Activity Tolerance Patient tolerated treatment well;Patient limited by fatigue   Patient Left in chair;with call bell/phone within reach;with chair alarm set   Nurse Communication Mobility status        Time: 8676-7209 OT Time Calculation (min): 15 min  Charges: OT General Charges $OT Visit: 1 Visit OT Treatments $Self Care/Home Management : 8-22 mins  Jackelyn Poling OTR/L, MS Acute Rehabilitation Department Office# [phone] Pager# [phone]   Marcellina Millin 09/06/2021, 11:40 AM

## 2021-09-06 NOTE — Care Management Important Message (Signed)
Important Message  Patient Details IM Letter placed in Patients room. Name: Casey Lee MRN: [id number] Date of Birth: [birth year]   Medicare Important Message Given:  Yes     Kerin Salen 09/06/2021, 10:58 AM

## 2021-09-06 NOTE — NC FL2 (Signed)
[place] LEVEL OF CARE SCREENING TOOL     IDENTIFICATION  Patient Name: Casey Lee Birthdate: 1927/11/02 Sex: male Admission Date (Current Location): 08/28/2021  [facility] and [place] Number:  Herbalist and Address:  [facility],  [place] [address] [place]      Provider Number: [id number]  Attending Physician Name and Address:  Hosie Poisson, MD  Relative Name and Phone Number:       Current Level of Care: Hospital Recommended Level of Care: [place] Prior Approval Number:    Date Approved/Denied:   PASRR Number: [id number]  Discharge Plan: SNF    Current Diagnoses: Patient Active Problem List   Diagnosis Date Noted   MRSE bacteremia 08/31/2021   Hypokalemia 08/31/2021   Bacteremia due to Gram-positive bacteria 08/30/2021   Generalized weakness    CAP (community acquired pneumonia) 08/28/2021   Sepsis ([place]) 08/28/2021   Influenza A 08/28/2021   Sepsis due to undetermined organism ([place]) 03/30/2021   PAF (paroxysmal atrial fibrillation) ([place]) 03/30/2021   Right knee pain 05/12/2018   Acute renal failure superimposed on stage 3 chronic kidney disease ([place]) 05/11/2018   Septic prepatellar bursitis of right knee 05/11/2018   Temporal arteritis ([place])    HLD (hyperlipidemia) 03/19/2016   SDH (subdural hematoma) 01/28/2016   BPPV (benign paroxysmal positional vertigo) 01/28/2016   Cerebral infarction ([place]) - incidental, punctate R periventricular infarct 01/28/2016   Essential hypertension 01/28/2016   Hyperlipidemia LDL goal <70 01/28/2016   Acute encephalopathy    Traumatic hemorrhage of cerebrum with loss of consciousness (HCC) - tiny L caudate 01/24/2016    Orientation RESPIRATION BLADDER Height & Weight     Self, Place  Normal Incontinent Weight: 50.9 kg Height:  5\' 9"  (175.3 cm)  BEHAVIORAL SYMPTOMS/MOOD NEUROLOGICAL BOWEL NUTRITION STATUS      Continent Diet (Heart Healthy)  AMBULATORY  STATUS COMMUNICATION OF NEEDS Skin   Limited Assist Verbally Skin abrasions                       Personal Care Assistance Level of Assistance  Bathing, Feeding, Dressing Bathing Assistance: Limited assistance Feeding assistance: Limited assistance Dressing Assistance: Limited assistance     Functional Limitations Info  Sight, Hearing Sight Info: Impaired Hearing Info: Adequate      SPECIAL CARE FACTORS FREQUENCY  PT (By licensed PT), OT (By licensed OT)     PT Frequency: 5 x weekly OT Frequency: 5 x weekly            Contractures Contractures Info: Not present    Additional Factors Info  Code Status, Allergies Code Status Info: Full Allergies Info: Fosamax, Clopidogrel           Current Medications (09/06/2021):  This is the current hospital active medication list Current Facility-Administered Medications  Medication Dose Route Frequency Provider Last Rate Last Admin   0.9 %  sodium chloride infusion   Intravenous PRN Hosie Poisson, MD   Stopped at 09/01/21 0807   acetaminophen (TYLENOL) tablet 650 mg  650 mg Oral Q6H PRN Hosie Poisson, MD   650 mg at 09/05/21 2114   Or   acetaminophen (TYLENOL) suppository 650 mg  650 mg Rectal Q6H PRN Hosie Poisson, MD       amiodarone (PACERONE) tablet 200 mg  200 mg Oral BID Hosie Poisson, MD   200 mg at 09/06/21 0906   apixaban (ELIQUIS) tablet 2.5 mg  2.5 mg Oral BID  Hosie Poisson, MD   2.5 mg at 09/06/21 0901   benzonatate (TESSALON) capsule 100 mg  100 mg Oral TID PRN Hosie Poisson, MD   100 mg at 09/05/21 1052   Chlorhexidine Gluconate Cloth 2 % PADS 6 each  6 each Topical Q0600 Hosie Poisson, MD   6 each at 09/06/21 0839   dextromethorphan-guaiFENesin ([place] DM) 30-600 MG per 12 hr tablet 1 tablet  1 tablet Oral BID Hosie Poisson, MD   1 tablet at 09/06/21 0839   fluticasone (FLONASE) 50 MCG/ACT nasal spray 2 spray  2 spray Each Nare Daily Hosie Poisson, MD   2 spray at 09/06/21 0841   hydrALAZINE (APRESOLINE)  tablet 25 mg  25 mg Oral Q8H PRN Hosie Poisson, MD       levalbuterol (XOPENEX) nebulizer solution 0.63 mg  0.63 mg Nebulization Q6H PRN Hosie Poisson, MD       loratadine (CLARITIN) tablet 10 mg  10 mg Oral Daily Hosie Poisson, MD   10 mg at 09/06/21 2778   MEDLINE mouth rinse  15 mL Mouth Rinse BID Hosie Poisson, MD   15 mL at 09/05/21 2113   melatonin tablet 5 mg  5 mg Oral Once Hosie Poisson, MD       metoprolol succinate (TOPROL-XL) 24 hr tablet 25 mg  25 mg Oral Daily Hosie Poisson, MD   25 mg at 09/06/21 0904   pantoprazole (PROTONIX) EC tablet 40 mg  40 mg Oral Q0600 Hosie Poisson, MD   40 mg at 09/06/21 2423   QUEtiapine (SEROQUEL) tablet 12.5 mg  12.5 mg Oral Daily PRN Hosie Poisson, MD       rosuvastatin (CRESTOR) tablet 10 mg  10 mg Oral Daily Hosie Poisson, MD   10 mg at 09/06/21 0841   tamsulosin (FLOMAX) capsule 0.4 mg  0.4 mg Oral QPC supper Hosie Poisson, MD   0.4 mg at 09/05/21 1750     Discharge Medications: Please see discharge summary for a list of discharge medications.  Relevant Imaging Results:  Relevant Lab Results:   Additional Information SS# [id number]  Towanda Hornstein, Marjie Skiff, RN

## 2021-09-06 NOTE — Progress Notes (Signed)
Marchelle Folks, RN at [facility]. Left number if she had any additional questions. Patient is stable for transfer and son is taking his Dad's belongings to facility. [facility] is transporting patient and receiving RN is ready to receive patient.

## 2021-09-06 NOTE — Discharge Summary (Signed)
Physician Discharge Summary  Casey Lee:[id number] DOB: [birth year] DOA: 08/28/2021  PCP: [name], MD  Admit date: 08/28/2021 Discharge date: 09/06/2021  Admitted From: [facility].  Disposition:  SNF  Recommendations for Outpatient Follow-up:  Follow up with PCP in 1-2 weeks Please obtain BMP/CBC in one week Please follow up with cardiology as scheduled.   Discharge Condition:GUARDED.  CODE STATUS:FULL CODE.  Diet recommendation: Heart Healthy   Brief/Interim Summary:  87 year old gentleman with prior history of temporal arteritis on prednisone, stage IIIa CKD, hyperlipidemia, atrial fibrillation presents with cough fevers and chills.  Patient was found to to have influenza A .  He ws started on steroids, tamiflu. Hospital course complicated by staph epidermidis bacteremia. Completed the course of vancomycin and tamiflu.   Discharge Diagnoses:  Principal Problem:   Sepsis ([place]) Active Problems:   Essential hypertension   HLD (hyperlipidemia)   Temporal arteritis (HCC)   PAF (paroxysmal atrial fibrillation) (HCC)   CAP (community acquired pneumonia)   Influenza A   Generalized weakness   Bacteremia due to Gram-positive bacteria   MRSE bacteremia   Hypokalemia    Sepsis secondary to pneumonia/staph epidermidis bacteremia/influenza A infection Improving sepsis physiology. Patient currently afebrile and leukocytosis is present.  Recheck CBC IN am.  Initial blood cultures were positive for staph epidermidis, repeat blood cultures have been negative so far ID consulted, recommendations for the duration of antibiotics  given. Completed the course of IV Vancomycin.  2D echocardiogram is negative for vegetations.   Influenza A positive infection/acute respiratory failure with hypoxia probably secondary to influenza A infection Completed the course of Tamiflu and continue with symptomatic management with bronchodilators Flonase PPI. Wheezing has improved with steroids.  Transitioned to prednisone , plan for a quick taper to home maintenance dose of prednisone He has been weaned off oxygen.           Atrial fibrillation with RVR probably secondary to acute infection Rate well controlled with metoprolol and amiodarone TSH within normal limits Echocardiogram shows left ventricular ejection fraction of 45 to 50%, with lateral hypokinesis, basal inferior wall hypokinesis.  Patient transitioned from IV amiodarone to oral amiodarone 200 mg twice daily and Toprol. Patient is on Eliquis for anticoagulation. Appreciate cardiology recommendations.  Cardiology plans to repeat another echo as outpatient.          History of temporal arteritis Was on prednisone 5 mg daily, continue the same on discharge.    Hyperlipidemia Continue with statin     Acute metabolic encephalopathy Probably secondary to sepsis, pneumonia, influenza infection. Appears to have resolved. Pt is alert and oriented to person and place and answering questions appropriately      History of chronic thrombocytopenia Platelet counts have improved      Stage IIIa CKD Creatinine back to baseline.        Hypokalemia Replaced.  Repeat level in the morning    Uncontrolled hypertension Patient is on metoprolol 25 mg daily, .      Discharge Instructions  Discharge Instructions     Amb referral to [facility]   Complete by: As directed    Diet - low sodium heart healthy   Complete by: As directed    Discharge instructions   Complete by: As directed    Please follow up with cardiology as scheduled.      Allergies as of 09/06/2021       Reactions   Fosamax [alendronate] Other (See Comments)   SEVERE JAW(BONE) PAIN   Clopidogrel  Other reaction(s): Other (See Comments)        Medication List     STOP taking these medications    cefdinir 300 MG capsule Commonly known as: OMNICEF   Xofluza (40 MG Dose) 1 x 40 MG Tbpk Generic drug: Baloxavir Marboxil(40 MG  Dose)       TAKE these medications    albuterol 108 (90 Base) MCG/ACT inhaler Commonly known as: VENTOLIN HFA Inhale 2 puffs into the lungs every 6 (six) hours as needed for wheezing or shortness of breath.   amiodarone 200 MG tablet Commonly known as: PACERONE Take 1 tablet (200 mg total) by mouth 2 (two) times daily.   apixaban 2.5 MG Tabs tablet Commonly known as: ELIQUIS Take 1 tablet (2.5 mg total) by mouth 2 (two) times daily.   benzonatate 100 MG capsule Commonly known as: Tessalon Perles Take 1 capsule (100 mg total) by mouth 3 (three) times daily as needed for cough.   CALCIUM/D3 ADULT GUMMIES PO Take 3 tablets by mouth See admin instructions. CHEW 3 gummies by mouth daily   COQ10 PO Take 1 capsule by mouth daily with lunch.   fluticasone 50 MCG/ACT nasal spray Commonly known as: FLONASE Place 2 sprays into both nostrils daily. Start taking on: September 07, 2021   loratadine 10 MG tablet Commonly known as: CLARITIN Take 10 mg by mouth daily.   metoprolol succinate 25 MG 24 hr tablet Commonly known as: TOPROL-XL Take 1 tablet (25 mg total) by mouth daily.   neomycin-polymyxin b-dexamethasone 3.5-10000-0.1 Oint Commonly known as: MAXITROL Place 1 application into the left eye 2 (two) times daily.   predniSONE 5 MG tablet Commonly known as: DELTASONE Take 5 mg by mouth daily. What changed: Another medication with the same name was removed. Continue taking this medication, and follow the directions you see here.   rosuvastatin 10 MG tablet Commonly known as: CRESTOR Take 10 mg by mouth daily.   tamsulosin 0.4 MG Caps capsule Commonly known as: FLOMAX Take 1 capsule (0.4 mg total) by mouth daily after supper.        Follow-up Information     Buford Dresser, MD. Go on 09/23/2021.   Specialty: Cardiology Why: Please come to appt with Dr. Harrell Gave 09/23/21 at 9 AM at our [facility] office at [facility]. Our office is on the 2nd  floor. Contact information: [place] [address] [phone]         [name], MD. Schedule an appointment as soon as possible for a visit in 1 week(s).   Specialty: Endocrinology Contact information: [address] [place] [address] [phone]         Buford Dresser, MD .   Specialty: Cardiology Contact information: [address] [place] [address] [phone]                Allergies  Allergen Reactions   Fosamax [Alendronate] Other (See Comments)    SEVERE JAW(BONE) PAIN   Clopidogrel     Other reaction(s): Other (See Comments)    Consultations:  Cardiology.  Procedures/Studies: DG Chest 2 View  Result Date: 09/01/2021 CLINICAL DATA:  Fever, cough. EXAM: CHEST - 2 VIEW COMPARISON:  August 28, 2021. FINDINGS: Stable cardiomediastinal silhouette. No pneumothorax is noted. Small bilateral pleural effusions are noted with adjacent subsegmental atelectasis or edema. Bony thorax is unremarkable. IMPRESSION: Mild bibasilar subsegmental atelectasis or edema is noted with small bilateral pleural effusions. Electronically Signed   By: Sabino Dick  Jr M.D.   On: 09/01/2021 09:47   CT Head Wo Contrast  Result Date: 08/28/2021 CLINICAL DATA:  Orthostatic changes and altered mental status. EXAM: CT HEAD WITHOUT CONTRAST TECHNIQUE: Contiguous axial images were obtained from the base of the skull through the vertex without intravenous contrast. COMPARISON:  September 08, 2016 FINDINGS: Brain: There is mild cerebral atrophy with widening of the extra-axial spaces and ventricular dilatation. There are areas of decreased attenuation within the white matter tracts of the supratentorial brain, consistent with microvascular disease changes. Small, bilateral chronic basal ganglia lacunar infarcts are noted. A chronic left occipital lobe infarct is also seen. Vascular: No hyperdense vessel or unexpected  calcification. Skull: Normal. Negative for fracture or focal lesion. Sinuses/Orbits: No acute finding. Other: None. IMPRESSION: 1. Generalized cerebral atrophy. 2. No acute intracranial abnormality. Electronically Signed   By: Virgina Norfolk M.D.   On: 08/28/2021 21:58   DG Chest Port 1 View  Result Date: 08/28/2021 CLINICAL DATA:  Questionable sepsis.  Diagnosed with flu EXAM: PORTABLE CHEST 1 VIEW COMPARISON:  Chest x-ray 04/02/2021, CT chest 03/31/2021 FINDINGS: The heart and mediastinal contours are unchanged. Aortic calcification. No focal consolidation. Question retrocardiac airspace opacity. No pleural effusion. No pneumothorax. No acute osseous abnormality. IMPRESSION: Question retrocardiac airspace opacity. Electronically Signed   By: Iven Finn M.D.   On: 08/28/2021 21:46   DG Shoulder Left Port  Result Date: 09/03/2021 CLINICAL DATA:  [place] from ICU bed today with left shoulder injury. EXAM: LEFT SHOULDER AP and transscapular Y COMPARISON:  Study of 01/25/2016. FINDINGS: There is osteopenia without evidence of fractures or dislocation. There is preservation of the normal acromiohumeral space. Slight spurring again noted AC joint. Surrounding soft tissues are grossly unremarkable. IMPRESSION: Osteopenia and degenerative change without evidence of fractures Electronically Signed   By: Telford Nab M.D.   On: 09/03/2021 22:25   DG Shoulder Right Port  Result Date: 09/03/2021 CLINICAL DATA:  [place] from ICU bed today with shoulder injury. EXAM: PORTABLE RIGHT SHOULDER AP and transscapular Y COMPARISON:  None. FINDINGS: There is osteopenia without evidence of fractures. No dislocation is seen. Narrowing and spurring of the [facility] joint is present and calcified synovial pannus in the superior aspect of the AC joint. There are small calcifications along the distal humeral cuff insertion which may be seen with calcific tendinitis or tendinopathy. There is preservation of the normal acromiohumeral  space. Visualized right lung fields appear generally clear. No displaced fracture is seen of the visualized right ribs. IMPRESSION: Osteopenia and degenerative change without evidence of fractures. Additional chronic findings. Electronically Signed   By: Telford Nab M.D.   On: 09/03/2021 22:35   ECHOCARDIOGRAM COMPLETE  Result Date: 08/30/2021    ECHOCARDIOGRAM REPORT   Patient Name:   Casey Lee [facility] Date of Exam: 08/30/2021 Medical Rec #:  [id number]        Height:       69.0 in Accession #:    [id number]       Weight:       150.4 lb Date of Birth:  09-22-1928       BSA:          1.830 m Patient Age:    23 years         BP:           85/60 mmHg Patient Gender: M                HR:  90 bpm. Exam Location:  Inpatient Procedure: 2D Echo, Cardiac Doppler, Color Doppler and Intracardiac            Opacification Agent Indications:    I48.91* Unspeicified atrial fibrillation  History:        Patient has no prior history of Echocardiogram examinations.                 Risk Factors:Hypertension.  Sonographer:    Glo Herring Referring Phys: [id number] DANIEL V THOMPSON IMPRESSIONS  1. POor acoustic windows limit study. Laterall hypokinesis (mid/distal), basal inferior hypokinesis.. Left ventricular ejection fraction, by estimation, is 45 to 50%. The left ventricle has mildly decreased function. Left ventricular diastolic parameters are indeterminate.  2. Right ventricular systolic function is normal. The right ventricular size is normal.  3. Mitral regurgitation is eccentric directed along posterior wall of left atrium . Mild to moderate mitral valve regurgitation.  4. AV is thickened, calcified with restricted motion Peak and mean gradients through the valve are 42 and 24 mm Hg respectively. Stroke volume index is 17, suggesting this may be low gradient AS. Dimensionless index is 18, consistent with severe AS>. The aortic valve was not well visualized. Aortic valve regurgitation is not visualized.  5. The  inferior vena cava is normal in size with <50% respiratory variability, suggesting right atrial pressure of 8 mmHg. FINDINGS  Left Ventricle: POor acoustic windows limit study. Laterall hypokinesis (mid/distal), basal inferior hypokinesis. Left ventricular ejection fraction, by estimation, is 45 to 50%. The left ventricle has mildly decreased function. The left ventricular internal cavity size was normal in size. Left ventricular diastolic parameters are indeterminate. Right Ventricle: The right ventricular size is normal. Right vetricular wall thickness was not assessed. Right ventricular systolic function is normal. Left Atrium: Left atrial size was normal in size. Right Atrium: Right atrial size was normal in size. Pericardium: There is no evidence of pericardial effusion. Mitral Valve: Mitral regurgitation is eccentric directed along posterior wall of left atrium. There is mild thickening of the mitral valve leaflet(s). Mild mitral annular calcification. Mild to moderate mitral valve regurgitation. Tricuspid Valve: The tricuspid valve is normal in structure. Tricuspid valve regurgitation is mild. Aortic Valve: AV is thickened, calcified with restricted motion Peak and mean gradients through the valve are 42 and 24 mm Hg respectively. Stroke volume index is 17, suggesting this may be low gradient AS. Dimensionless index is 18, consistent with severe AS>. The aortic valve was not well visualized. Aortic valve regurgitation is not visualized. Aortic valve mean gradient measures 23.8 mmHg. Aortic valve peak gradient measures 39.7 mmHg. Aortic valve area, by VTI measures 0.50 cm. Pulmonic Valve: The pulmonic valve was not well visualized. Pulmonic valve regurgitation is not visualized. No evidence of pulmonic stenosis. Aorta: The aortic root and ascending aorta are structurally normal, with no evidence of dilitation. Venous: The inferior vena cava is normal in size with less than 50% respiratory variability,  suggesting right atrial pressure of 8 mmHg. IAS/Shunts: No atrial level shunt detected by color flow Doppler.  LEFT VENTRICLE PLAX 2D LVOT diam:     1.90 cm     Diastology LV SV:         31          LV e' medial:    6.71 cm/s LV SV Index:   17          LV E/e' medial:  19.4 LVOT Area:     2.84 cm    LV e' lateral:  6.89 cm/s                            LV E/e' lateral: 18.9  LV Volumes (MOD) LV vol d, MOD A2C: 64.5 ml LV vol d, MOD A4C: 55.5 ml LV vol s, MOD A2C: 30.0 ml LV vol s, MOD A4C: 34.1 ml LV SV MOD A2C:     34.5 ml LV SV MOD A4C:     55.5 ml LV SV MOD BP:      30.5 ml RIGHT VENTRICLE          IVC RV Basal diam:  3.20 cm  IVC diam: 1.90 cm LEFT ATRIUM             Index        RIGHT ATRIUM           Index LA Vol (A2C):   42.8 ml 23.39 ml/m  RA Area:     16.50 cm LA Vol (A4C):   39.0 ml 21.31 ml/m  RA Volume:   45.50 ml  24.86 ml/m LA Biplane Vol: 41.1 ml 22.46 ml/m  AORTIC VALVE                     PULMONIC VALVE AV Area (Vmax):    0.44 cm      PV Vmax:       0.91 m/s AV Area (Vmean):   0.41 cm      PV Peak grad:  3.3 mmHg AV Area (VTI):     0.50 cm AV Vmax:           315.00 cm/s AV Vmean:          226.750 cm/s AV VTI:            0.616 m AV Peak Grad:      39.7 mmHg AV Mean Grad:      23.8 mmHg LVOT Vmax:         48.93 cm/s LVOT Vmean:        32.800 cm/s LVOT VTI:          0.109 m LVOT/AV VTI ratio: 0.18  AORTA Ao Root diam: 2.70 cm Ao Asc diam:  2.90 cm MITRAL VALVE                TRICUSPID VALVE MV Area (PHT): 4.25 cm     TR Peak grad:   33.2 mmHg MV Decel Time: 179 msec     TR Vmax:        288.00 cm/s MV E velocity: 130.33 cm/s                             SHUNTS                             Systemic VTI:  0.11 m                             Systemic Diam: 1.90 cm Dorris Carnes MD Electronically signed by Dorris Carnes MD Signature Date/Time: 08/30/2021/3:35:13 PM    Final       Subjective: No chest pain or sob,.   Discharge Exam: Vitals:   09/06/21 0612 09/06/21 0851  BP: 136/87   Pulse: 81    Resp: 18   Temp: (!) 97.5 F (  36.4 C)   SpO2: 95% 96%   Vitals:   09/05/21 2040 09/06/21 0221 09/06/21 0612 09/06/21 0851  BP:  118/76 136/87   Pulse:  73 81   Resp:  18 18   Temp:  (!) 97.5 F (36.4 C) (!) 97.5 F (36.4 C)   TempSrc:  Oral Oral   SpO2: 98% 94% 95% 96%  Weight:      Height:        General: Pt is alert, awake, not in acute distress Cardiovascular: RRR, S1/S2 +, no rubs, no gallops Respiratory: CTA bilaterally, no wheezing, no rhonchi Abdominal: Soft, NT, ND, bowel sounds + Extremities: no edema, no cyanosis    The results of significant diagnostics from this hospitalization (including imaging, microbiology, ancillary and laboratory) are listed below for reference.     Microbiology: Recent Results (from the past 240 hour(s))  Resp Panel by RT-PCR (Flu A&B, Covid) Nasopharyngeal Swab     Status: Abnormal   Collection Time: 08/28/21  9:32 PM   Specimen: Nasopharyngeal Swab; Nasopharyngeal(NP) swabs in vial transport medium  Result Value Ref Range Status   SARS Coronavirus 2 by RT PCR NEGATIVE NEGATIVE Final    Comment: (NOTE) SARS-CoV-2 target nucleic acids are NOT DETECTED.  The SARS-CoV-2 RNA is generally detectable in upper respiratory specimens during the acute phase of infection. The lowest concentration of SARS-CoV-2 viral copies this assay can detect is 138 copies/mL. A negative result does not preclude SARS-Cov-2 infection and should not be used as the sole basis for treatment or other patient management decisions. A negative result may occur with  improper specimen collection/handling, submission of specimen other than nasopharyngeal swab, presence of viral mutation(s) within the areas targeted by this assay, and inadequate number of viral copies(<138 copies/mL). A negative result must be combined with clinical observations, patient history, and epidemiological information. The expected result is Negative.  Fact Sheet for Patients:   EntrepreneurPulse.com.au  Fact Sheet for Healthcare Providers:  IncredibleEmployment.be  This test is no t yet approved or cleared by the [place] FDA and  has been authorized for detection and/or diagnosis of SARS-CoV-2 by FDA under an Emergency Use Authorization (EUA). This EUA will remain  in effect (meaning this test can be used) for the duration of the COVID-19 declaration under Section 564(b)(1) of the Act, 21 U.S.C.section 360bbb-3(b)(1), unless the authorization is terminated  or revoked sooner.       Influenza A by PCR POSITIVE (A) NEGATIVE Final   Influenza B by PCR NEGATIVE NEGATIVE Final    Comment: (NOTE) The Xpert Xpress SARS-CoV-2/FLU/RSV plus assay is intended as an aid in the diagnosis of influenza from Nasopharyngeal swab specimens and should not be used as a sole basis for treatment. Nasal washings and aspirates are unacceptable for Xpert Xpress SARS-CoV-2/FLU/RSV testing.  Fact Sheet for Patients: EntrepreneurPulse.com.au  Fact Sheet for Healthcare Providers: IncredibleEmployment.be  This test is not yet approved or cleared by the [place] FDA and has been authorized for detection and/or diagnosis of SARS-CoV-2 by FDA under an Emergency Use Authorization (EUA). This EUA will remain in effect (meaning this test can be used) for the duration of the COVID-19 declaration under Section 564(b)(1) of the Act, 21 U.S.C. section 360bbb-3(b)(1), unless the authorization is terminated or revoked.  Performed at [facility], [place] [address] [place], [place] [address]   Blood Culture (routine x 2)     Status: Abnormal   Collection Time: 08/28/21  9:38 PM   Specimen: BLOOD  Result Value Ref Range Status   Specimen Description   Final    BLOOD LEFT ANTECUBITAL Performed at [facility] [address] [place], [place] [address]    Special  Requests   Final    BOTTLES DRAWN AEROBIC AND ANAEROBIC Blood Culture results may not be optimal due to an excessive volume of blood received in culture bottles Performed at [facility] [address], [place], [place] [address]    Culture  Setup Time   Final    GRAM POSITIVE COCCI IN CLUSTERS ANAEROBIC BOTTLE ONLY CRITICAL RESULT CALLED TO, READ BACK BY AND VERIFIED WITH: Ramon Dredge [id number] FCP Performed at [facility], [place] [address] [place], [place] [address]    Culture STAPHYLOCOCCUS EPIDERMIDIS (A)  Final   Report Status 08/31/2021 FINAL  Final   Organism ID, Bacteria STAPHYLOCOCCUS EPIDERMIDIS  Final      Susceptibility   Staphylococcus epidermidis - MIC*    CIPROFLOXACIN 4 RESISTANT Resistant     ERYTHROMYCIN <=0.25 SENSITIVE Sensitive     GENTAMICIN <=0.5 SENSITIVE Sensitive     OXACILLIN >=4 RESISTANT Resistant     TETRACYCLINE <=1 SENSITIVE Sensitive     VANCOMYCIN 1 SENSITIVE Sensitive     TRIMETH/SULFA <=10 SENSITIVE Sensitive     CLINDAMYCIN <=0.25 SENSITIVE Sensitive     RIFAMPIN <=0.5 SENSITIVE Sensitive     Inducible Clindamycin NEGATIVE Sensitive     * STAPHYLOCOCCUS EPIDERMIDIS  Blood Culture (routine x 2)     Status: Abnormal   Collection Time: 08/28/21  9:38 PM   Specimen: BLOOD  Result Value Ref Range Status   Specimen Description   Final    BLOOD RIGHT ANTECUBITAL Performed at [facility] [address] [place], [place] [address]    Special Requests   Final    BOTTLES DRAWN AEROBIC AND ANAEROBIC Blood Culture results may not be optimal due to an excessive volume of blood received in culture bottles Performed at [facility] [address] [place], [place] [address]    Culture  Setup Time   Final    GRAM POSITIVE COCCI IN CLUSTERS IN BOTH AEROBIC AND ANAEROBIC BOTTLES CRITICAL RESULT CALLED TO, READ BACK BY AND VERIFIED WITH: PHARMD LEANNE P [id number] FCP    Culture (A)   Final    STAPHYLOCOCCUS EPIDERMIDIS SUSCEPTIBILITIES PERFORMED ON PREVIOUS CULTURE WITHIN THE LAST 5 DAYS. Performed at [facility], [place] [address] [place], [place] [address]    Report Status 08/31/2021 FINAL  Final  Blood Culture ID Panel (Reflexed)     Status: Abnormal   Collection Time: 08/28/21  9:38 PM  Result Value Ref Range Status   Enterococcus faecalis NOT DETECTED NOT DETECTED Final   Enterococcus Faecium NOT DETECTED NOT DETECTED Final   Listeria monocytogenes NOT DETECTED NOT DETECTED Final   Staphylococcus species DETECTED (A) NOT DETECTED Final    Comment: CRITICAL RESULT CALLED TO, READ BACK BY AND VERIFIED WITH: PHARMD LEANNE P [id number] FCP    Staphylococcus aureus (BCID) NOT DETECTED NOT DETECTED Final   Staphylococcus epidermidis DETECTED (A) NOT DETECTED Final    Comment: Methicillin (oxacillin) resistant coagulase negative staphylococcus. Possible blood culture contaminant (unless isolated from more than one blood culture draw or clinical case suggests pathogenicity). No antibiotic treatment is indicated for blood  culture contaminants. CRITICAL RESULT CALLED TO, READ BACK BY AND VERIFIED WITH: Gibson P [id number] FCP  Staphylococcus lugdunensis NOT DETECTED NOT DETECTED Final   Streptococcus species NOT DETECTED NOT DETECTED Final   Streptococcus agalactiae NOT DETECTED NOT DETECTED Final   Streptococcus pneumoniae NOT DETECTED NOT DETECTED Final   Streptococcus pyogenes NOT DETECTED NOT DETECTED Final   A.calcoaceticus-baumannii NOT DETECTED NOT DETECTED Final   Bacteroides fragilis NOT DETECTED NOT DETECTED Final   Enterobacterales NOT DETECTED NOT DETECTED Final   Enterobacter cloacae complex NOT DETECTED NOT DETECTED Final   Escherichia coli NOT DETECTED NOT DETECTED Final   Klebsiella aerogenes NOT DETECTED NOT DETECTED Final   Klebsiella oxytoca NOT DETECTED NOT DETECTED Final   Klebsiella pneumoniae NOT DETECTED NOT DETECTED Final    Proteus species NOT DETECTED NOT DETECTED Final   Salmonella species NOT DETECTED NOT DETECTED Final   Serratia marcescens NOT DETECTED NOT DETECTED Final   Haemophilus influenzae NOT DETECTED NOT DETECTED Final   Neisseria meningitidis NOT DETECTED NOT DETECTED Final   Pseudomonas aeruginosa NOT DETECTED NOT DETECTED Final   Stenotrophomonas maltophilia NOT DETECTED NOT DETECTED Final   Candida albicans NOT DETECTED NOT DETECTED Final   Candida auris NOT DETECTED NOT DETECTED Final   Candida glabrata NOT DETECTED NOT DETECTED Final   Candida krusei NOT DETECTED NOT DETECTED Final   Candida parapsilosis NOT DETECTED NOT DETECTED Final   Candida tropicalis NOT DETECTED NOT DETECTED Final   Cryptococcus neoformans/gattii NOT DETECTED NOT DETECTED Final   Methicillin resistance mecA/C DETECTED (A) NOT DETECTED Final    Comment: CRITICAL RESULT CALLED TO, READ BACK BY AND VERIFIED WITH: Ramon Dredge [id number] FCP Performed at [facility], [address], [place], [place] [address]   MRSA Next Gen by PCR, Nasal     Status: None   Collection Time: 08/29/21  2:08 AM   Specimen: Nasal Mucosa; Nasal Swab  Result Value Ref Range Status   MRSA by PCR Next Gen NOT DETECTED NOT DETECTED Final    Comment: (NOTE) The GeneXpert MRSA Assay (FDA approved for NASAL specimens only), is one component of a comprehensive MRSA colonization surveillance program. It is not intended to diagnose MRSA infection nor to guide or monitor treatment for MRSA infections. Test performance is not FDA approved in patients less than 24 years old. Performed at [facility], [place] [address] [place], [place] [address]   Urine Culture     Status: None   Collection Time: 08/29/21  1:20 PM   Specimen: Urine, Random  Result Value Ref Range Status   Specimen Description   Final    URINE, RANDOM Performed at [place] [address], [place], [place] [address]     Special Requests   Final    NONE Performed at [facility], [place] [address], [place], [place] [address]    Culture   Final    NO GROWTH Performed at [facility], [place] [address], [place], [place] [address]    Report Status 08/30/2021 FINAL  Final  Culture, blood (Routine X 2) w Reflex to ID Panel     Status: None   Collection Time: 08/31/21  2:55 PM   Specimen: Left Antecubital; Blood  Result Value Ref Range Status   Specimen Description   Final    LEFT ANTECUBITAL Performed at [place] [address], [place], [place] [address]    Special Requests   Final    BOTTLES DRAWN AEROBIC ONLY Blood Culture adequate volume Performed at [facility],  [place] [address] [place], [place] [address]    Culture   Final    NO GROWTH 5 DAYS Performed at [facility], [place] [address], [place], [place] [address]    Report Status 09/05/2021 FINAL  Final  Culture, blood (Routine X 2) w Reflex to ID Panel     Status: None   Collection Time: 08/31/21  3:41 PM   Specimen: BLOOD LEFT FOREARM  Result Value Ref Range Status   Specimen Description   Final    BLOOD LEFT FOREARM Performed at [place] [address] [place], [place] [address]    Special Requests   Final    BOTTLES DRAWN AEROBIC ONLY Blood Culture results may not be optimal due to an inadequate volume of blood received in culture bottles Performed at [place] [address], [place], [place] [address]    Culture   Final    NO GROWTH 5 DAYS Performed at [facility], [place] [address], [place],  [address]    Report Status 09/05/2021 FINAL  Final     Labs: BNP (last 3 results) Recent Labs    04/03/21 0344 04/04/21 0638 09/01/21 0847  BNP 384.2* 462.0* 614.4*   Basic Metabolic Panel: Recent Labs  Lab 08/31/21 0239 09/01/21 0250 09/02/21 0236 09/03/21 0239 09/04/21 0257 09/05/21 0255  09/06/21 0849  NA 137 135 137 137 137 135 136  K 3.3* 3.4* 3.4* 4.1 3.5 3.2* 3.7  CL 106 107 107 106 105 104 107  CO2 18* 20* 20* 20* 20* 23 24  GLUCOSE 173* 162* 157* 222* 228* 228* 301*  BUN 36* 39* 41* 46* 53* 61* 57*  CREATININE 1.52* 1.54* 1.61* 1.59* 1.83* 1.95* 1.69*  CALCIUM 8.7* 8.2* 8.2* 8.3* 8.3* 8.2* 8.3*  MG 1.8 2.0  --   --   --   --   --    Liver Function Tests: No results for input(s): AST, ALT, ALKPHOS, BILITOT, PROT, ALBUMIN in the last 168 hours. No results for input(s): LIPASE, AMYLASE in the last 168 hours. No results for input(s): AMMONIA in the last 168 hours. CBC: Recent Labs  Lab 08/31/21 0239 09/01/21 0250 09/02/21 1156 09/05/21 0255 09/06/21 0849  WBC 15.4* 11.7* 13.0* 9.6 9.7  NEUTROABS 13.6* 9.9* 11.3*  --  8.1*  HGB 13.0 12.9* 13.7 13.1 13.3  HCT 40.3 40.7 41.1 39.7 40.7  MCV 94.2 94.4 90.3 90.0 90.0  PLT 137* 144* 186 191 191   Cardiac Enzymes: No results for input(s): CKTOTAL, CKMB, CKMBINDEX, TROPONINI in the last 168 hours. BNP: Invalid input(s): POCBNP CBG: No results for input(s): GLUCAP in the last 168 hours. D-Dimer No results for input(s): DDIMER in the last 72 hours. Hgb A1c No results for input(s): HGBA1C in the last 72 hours. Lipid Profile No results for input(s): CHOL, HDL, LDLCALC, TRIG, CHOLHDL, LDLDIRECT in the last 72 hours. Thyroid function studies No results for input(s): TSH, T4TOTAL, T3FREE, THYROIDAB in the last 72 hours.  Invalid input(s): FREET3 Anemia work up No results for input(s): VITAMINB12, FOLATE, FERRITIN, TIBC, IRON, RETICCTPCT in the last 72 hours. Urinalysis    Component Value Date/Time   COLORURINE YELLOW 08/29/2021 1320   APPEARANCEUR CLEAR 08/29/2021 1320   LABSPEC 1.019 08/29/2021 1320   PHURINE 5.0 08/29/2021 1320   GLUCOSEU 50 (A) 08/29/2021 1320   HGBUR NEGATIVE 08/29/2021 1320   BILIRUBINUR NEGATIVE 08/29/2021 1320   KETONESUR 20 (A) 08/29/2021 1320   PROTEINUR NEGATIVE 08/29/2021  [place] 08/29/2021 [place] 08/29/2021 1320   Sepsis Labs Invalid input(s): PROCALCITONIN,  WBC,  LACTICIDVEN Microbiology Recent Results (from the past 240 hour(s))  Resp Panel by RT-PCR (Flu A&B, Covid) Nasopharyngeal Swab     Status: Abnormal   Collection Time: 08/28/21  9:32 PM   Specimen: Nasopharyngeal Swab; Nasopharyngeal(NP) swabs in vial transport medium  Result Value Ref Range Status   SARS Coronavirus 2 by RT PCR NEGATIVE NEGATIVE Final    Comment: (NOTE) SARS-CoV-2 target nucleic acids are NOT DETECTED.  The SARS-CoV-2 RNA is generally detectable in upper respiratory specimens during the acute phase of infection. The lowest concentration of SARS-CoV-2 viral copies this assay can detect is 138 copies/mL. A negative result does not preclude SARS-Cov-2 infection and should not be used as the sole basis for treatment or other patient management decisions. A negative result may occur with  improper specimen collection/handling, submission of specimen other than nasopharyngeal swab, presence of viral mutation(s) within the areas targeted by this assay, and inadequate number of viral copies(<138 copies/mL). A negative result must be combined with clinical observations, patient history, and epidemiological information. The expected result is Negative.  Fact Sheet for Patients:  EntrepreneurPulse.com.au  Fact Sheet for Healthcare Providers:  IncredibleEmployment.be  This test is no t yet approved or cleared by the [place] FDA and  has been authorized for detection and/or diagnosis of SARS-CoV-2 by FDA under an Emergency Use Authorization (EUA). This EUA will remain  in effect (meaning this test can be used) for the duration of the COVID-19 declaration under Section 564(b)(1) of the Act, 21 U.S.C.section 360bbb-3(b)(1), unless the authorization is terminated  or revoked sooner.       Influenza A by  PCR POSITIVE (A) NEGATIVE Final   Influenza B by PCR NEGATIVE NEGATIVE Final    Comment: (NOTE) The Xpert Xpress SARS-CoV-2/FLU/RSV plus assay is intended as an aid in the diagnosis of influenza from Nasopharyngeal swab specimens and should not be used as a sole basis for treatment. Nasal washings and aspirates are unacceptable for Xpert Xpress SARS-CoV-2/FLU/RSV testing.  Fact Sheet for Patients: EntrepreneurPulse.com.au  Fact Sheet for Healthcare Providers: IncredibleEmployment.be  This test is not yet approved or cleared by the [place] FDA and has been authorized for detection and/or diagnosis of SARS-CoV-2 by FDA under an Emergency Use Authorization (EUA). This EUA will remain in effect (meaning this test can be used) for the duration of the COVID-19 declaration under Section 564(b)(1) of the Act, 21 U.S.C. section 360bbb-3(b)(1), unless the authorization is terminated or revoked.  Performed at [facility], [place] [address] [place], [place] [address]   Blood Culture (routine x 2)     Status: Abnormal   Collection Time: 08/28/21  9:38 PM   Specimen: BLOOD  Result Value Ref Range Status   Specimen Description   Final    BLOOD LEFT ANTECUBITAL Performed at [place] [address], [place], [place] [address]    Special Requests   Final    BOTTLES DRAWN AEROBIC AND ANAEROBIC Blood Culture results may not be optimal due to an excessive volume of blood received in culture bottles Performed at [place] [address] [place], [place] [address]    Culture  Setup Time   Final    GRAM POSITIVE COCCI IN CLUSTERS ANAEROBIC BOTTLE ONLY CRITICAL RESULT CALLED TO, READ BACK BY AND VERIFIED WITH: [place] P [phone] FCP  Performed at [facility], [place] [address], [place], [place] [address]    Culture STAPHYLOCOCCUS EPIDERMIDIS (A)  Final   Report Status 08/31/2021  FINAL  Final   Organism ID, Bacteria STAPHYLOCOCCUS EPIDERMIDIS  Final      Susceptibility   Staphylococcus epidermidis - MIC*    CIPROFLOXACIN 4 RESISTANT Resistant     ERYTHROMYCIN <=0.25 SENSITIVE Sensitive     GENTAMICIN <=0.5 SENSITIVE Sensitive     OXACILLIN >=4 RESISTANT Resistant     TETRACYCLINE <=1 SENSITIVE Sensitive     VANCOMYCIN 1 SENSITIVE Sensitive     TRIMETH/SULFA <=10 SENSITIVE Sensitive     CLINDAMYCIN <=0.25 SENSITIVE Sensitive     RIFAMPIN <=0.5 SENSITIVE Sensitive     Inducible Clindamycin NEGATIVE Sensitive     * STAPHYLOCOCCUS EPIDERMIDIS  Blood Culture (routine x 2)     Status: Abnormal   Collection Time: 08/28/21  9:38 PM   Specimen: BLOOD  Result Value Ref Range Status   Specimen Description   Final    BLOOD RIGHT ANTECUBITAL Performed at [place] [address], [place], [place] [address]    Special Requests   Final    BOTTLES DRAWN AEROBIC AND ANAEROBIC Blood Culture results may not be optimal due to an excessive volume of blood received in culture bottles Performed at [place] [address] [place], [place] [address]    Culture  Setup Time   Final    GRAM POSITIVE COCCI IN CLUSTERS IN BOTH AEROBIC AND ANAEROBIC BOTTLES CRITICAL RESULT CALLED TO, READ BACK BY AND VERIFIED WITH: PHARMD LEANNE P [phone] [id number] FCP    Culture (A)  Final    STAPHYLOCOCCUS EPIDERMIDIS SUSCEPTIBILITIES PERFORMED ON PREVIOUS CULTURE WITHIN THE LAST 5 DAYS. Performed at [facility], [place] [address], [place], [place] [address]    Report Status 08/31/2021 FINAL  Final  Blood Culture ID Panel (Reflexed)     Status: Abnormal   Collection Time: 08/28/21  9:38 PM  Result Value Ref Range Status   Enterococcus faecalis NOT DETECTED NOT DETECTED Final   Enterococcus Faecium NOT DETECTED NOT DETECTED Final   Listeria monocytogenes NOT DETECTED NOT DETECTED Final   Staphylococcus species DETECTED (A) NOT DETECTED Final     Comment: CRITICAL RESULT CALLED TO, READ BACK BY AND VERIFIED WITH: PHARMD LEANNE P [phone] [id number] FCP    Staphylococcus aureus (BCID) NOT DETECTED NOT DETECTED Final   Staphylococcus epidermidis DETECTED (A) NOT DETECTED Final    Comment: Methicillin (oxacillin) resistant coagulase negative staphylococcus. Possible blood culture contaminant (unless isolated from more than one blood culture draw or clinical case suggests pathogenicity). No antibiotic treatment is indicated for blood  culture contaminants. CRITICAL RESULT CALLED TO, READ BACK BY AND VERIFIED WITH: PHARMD LEANNE P [id number] FCP    Staphylococcus lugdunensis NOT DETECTED NOT DETECTED Final   Streptococcus species NOT DETECTED NOT DETECTED Final   Streptococcus agalactiae NOT DETECTED NOT DETECTED Final   Streptococcus pneumoniae NOT DETECTED NOT DETECTED Final   Streptococcus pyogenes NOT DETECTED NOT DETECTED Final   A.calcoaceticus-baumannii NOT DETECTED NOT DETECTED Final   Bacteroides fragilis NOT DETECTED NOT DETECTED Final   Enterobacterales NOT DETECTED NOT DETECTED Final   Enterobacter cloacae complex NOT DETECTED NOT DETECTED Final   Escherichia coli NOT DETECTED NOT DETECTED Final   Klebsiella aerogenes NOT DETECTED NOT DETECTED Final   Klebsiella oxytoca NOT DETECTED NOT DETECTED Final   Klebsiella pneumoniae NOT DETECTED NOT DETECTED Final  Proteus species NOT DETECTED NOT DETECTED Final   Salmonella species NOT DETECTED NOT DETECTED Final   Serratia marcescens NOT DETECTED NOT DETECTED Final   Haemophilus influenzae NOT DETECTED NOT DETECTED Final   Neisseria meningitidis NOT DETECTED NOT DETECTED Final   Pseudomonas aeruginosa NOT DETECTED NOT DETECTED Final   Stenotrophomonas maltophilia NOT DETECTED NOT DETECTED Final   Candida albicans NOT DETECTED NOT DETECTED Final   Candida auris NOT DETECTED NOT DETECTED Final   Candida glabrata NOT DETECTED NOT DETECTED Final   Candida krusei NOT DETECTED NOT  DETECTED Final   Candida parapsilosis NOT DETECTED NOT DETECTED Final   Candida tropicalis NOT DETECTED NOT DETECTED Final   Cryptococcus neoformans/gattii NOT DETECTED NOT DETECTED Final   Methicillin resistance mecA/C DETECTED (A) NOT DETECTED Final    Comment: CRITICAL RESULT CALLED TO, READ BACK BY AND VERIFIED WITH: Ramon Dredge [id number] FCP Performed at [facility], [address], [place], [place] [address]   MRSA Next Gen by PCR, Nasal     Status: None   Collection Time: 08/29/21  2:08 AM   Specimen: Nasal Mucosa; Nasal Swab  Result Value Ref Range Status   MRSA by PCR Next Gen NOT DETECTED NOT DETECTED Final    Comment: (NOTE) The GeneXpert MRSA Assay (FDA approved for NASAL specimens only), is one component of a comprehensive MRSA colonization surveillance program. It is not intended to diagnose MRSA infection nor to guide or monitor treatment for MRSA infections. Test performance is not FDA approved in patients less than 70 years old. Performed at [facility], [place] [address] [place], [place] [address]   Urine Culture     Status: None   Collection Time: 08/29/21  1:20 PM   Specimen: Urine, Random  Result Value Ref Range Status   Specimen Description   Final    URINE, RANDOM Performed at [place] [address], [place], [place] [address]    Special Requests   Final    NONE Performed at [facility], [place] [address], [place], [place] [address]    Culture   Final    NO GROWTH Performed at [facility], [place] [address], [place], [place] [address]    Report Status 08/30/2021 FINAL  Final  Culture, blood (Routine X 2) w Reflex to ID Panel     Status: None   Collection Time: 08/31/21  2:55 PM   Specimen: Left Antecubital; Blood  Result Value Ref Range Status   Specimen Description   Final    LEFT ANTECUBITAL Performed at [place] [address], [place],  [place] [address]    Special Requests   Final    BOTTLES DRAWN AEROBIC ONLY Blood Culture adequate volume Performed at [place] [address] [place], [place] [address]    Culture   Final    NO GROWTH 5 DAYS Performed at [facility], [place] [address], [place], [place] [address]    Report Status 09/05/2021 FINAL  Final  Culture, blood (Routine X 2) w Reflex to ID Panel     Status: None   Collection Time: 08/31/21  3:41 PM   Specimen: BLOOD LEFT FOREARM  Result Value Ref Range Status   Specimen Description   Final    BLOOD LEFT FOREARM Performed at [place] [address], [place],  [address]    Special Requests   Final  BOTTLES DRAWN AEROBIC ONLY Blood Culture results may not be optimal due to an inadequate volume of blood received in culture bottles Performed at [facility], [place] [address] [place], [place] [address]    Culture   Final    NO GROWTH 5 DAYS Performed at [facility], [place] [address] [place], [place] [address]    Report Status 09/05/2021 FINAL  Final     Time coordinating discharge: 36 minutes.   SIGNED:   Hosie Poisson, MD  [facility] 09/06/2021, 12:02 PM

## 2021-09-06 NOTE — Progress Notes (Signed)
Physical Therapy Treatment Patient Details Name: Casey Lee MRN: [id number] DOB: December 04, 1927 Today's Date: 09/06/2021   History of Present Illness 87 year old male presented 08/28/21 with cough,  fever, chills, Flu A.  COVID 19 PCR negative. In ED patient noted to be hypotensive systolic blood pressures  A. fib with RVR, chest x-ray consistent with retrosternal densities. PMH including temporal arteritis on prednisone, CKD stage IIIa, hyperlipidemia, A. fib.    PT Comments    Pt assisted with ambulating in hallway and pleased with progress today. Pt plans to d/c to [facility] for rehab (pt and son anticipating later today).    Recommendations for follow up therapy are one component of a multi-disciplinary discharge planning process, led by the attending physician.  Recommendations may be updated based on patient status, additional functional criteria and insurance authorization.  Follow Up Recommendations  Skilled nursing-short term rehab (<3 hours/day)     Assistance Recommended at Discharge Intermittent Supervision/Assistance  Equipment Recommendations  Rolling walker (2 wheels)    Recommendations for Other Services       Precautions / Restrictions Precautions Precautions: Fall Precaution Comments: Watch SpO2 and RR Restrictions Weight Bearing Restrictions: No     Mobility  Bed Mobility Overal bed mobility: Needs Assistance Bed Mobility: Supine to Sit     Supine to sit: Min guard;HOB elevated     General bed mobility comments: pt OOB in recliner    Transfers Overall transfer level: Needs assistance Equipment used: Rolling walker (2 wheels) Transfers: Sit to/from Stand Sit to Stand: Min guard;From elevated surface     Step pivot transfers: Min assist     General transfer comment: verbal cues for hand placement, increased time and effort    Ambulation/Gait Ambulation/Gait assistance: Min guard;Min assist Gait Distance (Feet): 200 Feet Assistive  device: Rolling walker (2 wheels) Gait Pattern/deviations: Step-through pattern;Decreased stride length;Trunk flexed;Narrow base of support Gait velocity: decr     General Gait Details: verbal cues for RW Positioning and posture, one standing rest break due to fatigue, HR 109 bpm during ambulation   Stairs             Wheelchair Mobility    Modified Rankin (Stroke Patients Only)       Balance Overall balance assessment: Needs assistance;Mild deficits observed, not formally tested                                          Cognition Arousal/Alertness: Awake/alert Behavior During Therapy: WFL for tasks assessed/performed Overall Cognitive Status: Within Functional Limits for tasks assessed                                 General Comments: patient noted to have difficulty with sequencing of tasks, patient unable to process more than one cue at a time. patient unable to sequence how to remove toothbrush from ziplock bag with increased time on this date.        Exercises      General Comments        Pertinent Vitals/Pain Pain Assessment: No/denies pain    Home Living                          Prior Function            PT Goals (current goals  can now be found in the care plan section) Progress towards PT goals: Progressing toward goals    Frequency    Min 3X/week      PT Plan Current plan remains appropriate    Co-evaluation              AM-PAC PT "6 Clicks" Mobility   Outcome Measure  Help needed turning from your back to your side while in a flat bed without using bedrails?: A Little Help needed moving from lying on your back to sitting on the side of a flat bed without using bedrails?: A Little Help needed moving to and from a bed to a chair (including a wheelchair)?: A Little Help needed standing up from a chair using your arms (e.g., wheelchair or bedside chair)?: A Little Help needed to walk in  hospital room?: A Little Help needed climbing 3-5 steps with a railing? : A Lot 6 Click Score: 17    End of Session Equipment Utilized During Treatment: Gait belt Activity Tolerance: Patient tolerated treatment well Patient left: in chair;with call bell/phone within reach;with chair alarm set Nurse Communication: Mobility status PT Visit Diagnosis: Unsteadiness on feet (R26.81);Difficulty in walking, not elsewhere classified (R26.2)     Time: 9924-2683 PT Time Calculation (min) (ACUTE ONLY): 17 min  Charges:  $Gait Training: 8-22 mins                    Jannette Spanner PT, DPT Acute Rehabilitation Services Pager: [phone] Office: [place] 09/06/2021, 1:49 PM

## 2021-09-06 NOTE — TOC Transition Note (Signed)
Transition of Care [facility]) - CM/SW Discharge Note   Patient Details  Name: MCDONALD REILING MRN: [id number] Date of Birth: Jun 15, 1928  [facility] CM/SW Contact:  Lynnell Catalan, RN Phone Number: 09/06/2021, 2:23 PM   Clinical Narrative:    Family would like pt to go to [facility] [place]. Pt to dc to [facility] today via [facility]. Son [name] called to inform of transfer. RN to call report to [phone].    Final next level of care: Skilled Nursing Facility Barriers to Discharge: No Barriers Identified   Patient Goals and CMS Choice Patient states their goals for this hospitalization and ongoing recovery are:: pt is hopeful he will be able to dc home      Discharge Placement              Patient chooses bed at: [facility] Patient to be transferred to facility by: [facility] Name of family member notified: [name] (Son) Patient and family notified of of transfer: 09/06/21  Discharge Plan and Services In-house Referral: Clinical Social Work              Readmission Risk Interventions No flowsheet data found.

## 2021-09-07 DIAGNOSIS — I10 Essential (primary) hypertension: Secondary | ICD-10-CM | POA: Diagnosis not present

## 2021-09-07 DIAGNOSIS — G9340 Encephalopathy, unspecified: Secondary | ICD-10-CM | POA: Diagnosis not present

## 2021-09-07 DIAGNOSIS — E785 Hyperlipidemia, unspecified: Secondary | ICD-10-CM | POA: Diagnosis not present

## 2021-09-07 DIAGNOSIS — R531 Weakness: Secondary | ICD-10-CM | POA: Diagnosis not present

## 2021-09-07 DIAGNOSIS — I480 Paroxysmal atrial fibrillation: Secondary | ICD-10-CM | POA: Diagnosis not present

## 2021-09-08 DIAGNOSIS — N390 Urinary tract infection, site not specified: Secondary | ICD-10-CM | POA: Diagnosis not present

## 2021-09-08 DIAGNOSIS — Z79899 Other long term (current) drug therapy: Secondary | ICD-10-CM | POA: Diagnosis not present

## 2021-09-08 DIAGNOSIS — I10 Essential (primary) hypertension: Secondary | ICD-10-CM | POA: Diagnosis not present

## 2021-09-09 DIAGNOSIS — R531 Weakness: Secondary | ICD-10-CM | POA: Diagnosis not present

## 2021-09-09 DIAGNOSIS — D72829 Elevated white blood cell count, unspecified: Secondary | ICD-10-CM | POA: Diagnosis not present

## 2021-09-09 DIAGNOSIS — Z8701 Personal history of pneumonia (recurrent): Secondary | ICD-10-CM | POA: Diagnosis not present

## 2021-09-09 DIAGNOSIS — R300 Dysuria: Secondary | ICD-10-CM | POA: Diagnosis not present

## 2021-09-09 DIAGNOSIS — N390 Urinary tract infection, site not specified: Secondary | ICD-10-CM | POA: Diagnosis not present

## 2021-09-09 DIAGNOSIS — R0609 Other forms of dyspnea: Secondary | ICD-10-CM | POA: Diagnosis not present

## 2021-09-09 DIAGNOSIS — R10819 Abdominal tenderness, unspecified site: Secondary | ICD-10-CM | POA: Diagnosis not present

## 2021-09-09 DIAGNOSIS — Z8709 Personal history of other diseases of the respiratory system: Secondary | ICD-10-CM | POA: Diagnosis not present

## 2021-09-09 DIAGNOSIS — Z87440 Personal history of urinary (tract) infections: Secondary | ICD-10-CM | POA: Diagnosis not present

## 2021-09-10 DIAGNOSIS — Z8701 Personal history of pneumonia (recurrent): Secondary | ICD-10-CM | POA: Diagnosis not present

## 2021-09-10 DIAGNOSIS — D72829 Elevated white blood cell count, unspecified: Secondary | ICD-10-CM | POA: Diagnosis not present

## 2021-09-10 DIAGNOSIS — R531 Weakness: Secondary | ICD-10-CM | POA: Diagnosis not present

## 2021-09-10 DIAGNOSIS — Z8709 Personal history of other diseases of the respiratory system: Secondary | ICD-10-CM | POA: Diagnosis not present

## 2021-09-10 DIAGNOSIS — I517 Cardiomegaly: Secondary | ICD-10-CM | POA: Diagnosis not present

## 2021-09-10 DIAGNOSIS — Z79899 Other long term (current) drug therapy: Secondary | ICD-10-CM | POA: Diagnosis not present

## 2021-09-10 DIAGNOSIS — I10 Essential (primary) hypertension: Secondary | ICD-10-CM | POA: Diagnosis not present

## 2021-09-10 DIAGNOSIS — R918 Other nonspecific abnormal finding of lung field: Secondary | ICD-10-CM | POA: Diagnosis not present

## 2021-09-10 DIAGNOSIS — R0609 Other forms of dyspnea: Secondary | ICD-10-CM | POA: Diagnosis not present

## 2021-09-10 DIAGNOSIS — Z87440 Personal history of urinary (tract) infections: Secondary | ICD-10-CM | POA: Diagnosis not present

## 2021-09-13 DIAGNOSIS — R531 Weakness: Secondary | ICD-10-CM | POA: Diagnosis not present

## 2021-09-13 DIAGNOSIS — J189 Pneumonia, unspecified organism: Secondary | ICD-10-CM | POA: Diagnosis not present

## 2021-09-13 DIAGNOSIS — Z79899 Other long term (current) drug therapy: Secondary | ICD-10-CM | POA: Diagnosis not present

## 2021-09-13 DIAGNOSIS — N390 Urinary tract infection, site not specified: Secondary | ICD-10-CM | POA: Diagnosis not present

## 2021-09-13 DIAGNOSIS — R0609 Other forms of dyspnea: Secondary | ICD-10-CM | POA: Diagnosis not present

## 2021-09-13 DIAGNOSIS — D72829 Elevated white blood cell count, unspecified: Secondary | ICD-10-CM | POA: Diagnosis not present

## 2021-09-14 DIAGNOSIS — J189 Pneumonia, unspecified organism: Secondary | ICD-10-CM | POA: Diagnosis not present

## 2021-09-14 DIAGNOSIS — R410 Disorientation, unspecified: Secondary | ICD-10-CM | POA: Diagnosis not present

## 2021-09-14 DIAGNOSIS — D72829 Elevated white blood cell count, unspecified: Secondary | ICD-10-CM | POA: Diagnosis not present

## 2021-09-14 DIAGNOSIS — R0609 Other forms of dyspnea: Secondary | ICD-10-CM | POA: Diagnosis not present

## 2021-09-14 DIAGNOSIS — R531 Weakness: Secondary | ICD-10-CM | POA: Diagnosis not present

## 2021-09-16 DIAGNOSIS — I10 Essential (primary) hypertension: Secondary | ICD-10-CM | POA: Diagnosis not present

## 2021-09-16 DIAGNOSIS — Z79899 Other long term (current) drug therapy: Secondary | ICD-10-CM | POA: Diagnosis not present

## 2021-09-17 DIAGNOSIS — R531 Weakness: Secondary | ICD-10-CM | POA: Diagnosis not present

## 2021-09-17 DIAGNOSIS — R0602 Shortness of breath: Secondary | ICD-10-CM | POA: Diagnosis not present

## 2021-09-17 DIAGNOSIS — J189 Pneumonia, unspecified organism: Secondary | ICD-10-CM | POA: Diagnosis not present

## 2021-09-17 DIAGNOSIS — D72829 Elevated white blood cell count, unspecified: Secondary | ICD-10-CM | POA: Diagnosis not present

## 2021-09-17 DIAGNOSIS — E440 Moderate protein-calorie malnutrition: Secondary | ICD-10-CM | POA: Diagnosis not present

## 2021-09-20 DIAGNOSIS — A419 Sepsis, unspecified organism: Secondary | ICD-10-CM | POA: Diagnosis not present

## 2021-09-20 DIAGNOSIS — R531 Weakness: Secondary | ICD-10-CM | POA: Diagnosis not present

## 2021-09-20 DIAGNOSIS — M25512 Pain in left shoulder: Secondary | ICD-10-CM | POA: Diagnosis not present

## 2021-09-20 DIAGNOSIS — R7881 Bacteremia: Secondary | ICD-10-CM | POA: Diagnosis not present

## 2021-09-20 DIAGNOSIS — J1108 Influenza due to unidentified influenza virus with specified pneumonia: Secondary | ICD-10-CM | POA: Diagnosis not present

## 2021-09-20 DIAGNOSIS — W19XXXA Unspecified fall, initial encounter: Secondary | ICD-10-CM | POA: Diagnosis not present

## 2021-09-21 DIAGNOSIS — S4992XA Unspecified injury of left shoulder and upper arm, initial encounter: Secondary | ICD-10-CM | POA: Diagnosis not present

## 2021-09-23 ENCOUNTER — Other Ambulatory Visit: Payer: Self-pay

## 2021-09-23 ENCOUNTER — Ambulatory Visit (INDEPENDENT_AMBULATORY_CARE_PROVIDER_SITE_OTHER): Payer: Medicare Other | Admitting: Cardiology

## 2021-09-23 ENCOUNTER — Ambulatory Visit (HOSPITAL_BASED_OUTPATIENT_CLINIC_OR_DEPARTMENT_OTHER): Payer: Medicare Other | Admitting: Cardiology

## 2021-09-23 ENCOUNTER — Encounter (HOSPITAL_BASED_OUTPATIENT_CLINIC_OR_DEPARTMENT_OTHER): Payer: Self-pay | Admitting: Cardiology

## 2021-09-23 VITALS — BP 112/60 | HR 83

## 2021-09-23 DIAGNOSIS — I429 Cardiomyopathy, unspecified: Secondary | ICD-10-CM

## 2021-09-23 DIAGNOSIS — I480 Paroxysmal atrial fibrillation: Secondary | ICD-10-CM

## 2021-09-23 DIAGNOSIS — Z09 Encounter for follow-up examination after completed treatment for conditions other than malignant neoplasm: Secondary | ICD-10-CM | POA: Diagnosis not present

## 2021-09-23 DIAGNOSIS — Z8709 Personal history of other diseases of the respiratory system: Secondary | ICD-10-CM | POA: Insufficient documentation

## 2021-09-23 DIAGNOSIS — I350 Nonrheumatic aortic (valve) stenosis: Secondary | ICD-10-CM | POA: Diagnosis not present

## 2021-09-23 NOTE — Patient Instructions (Signed)
Medication Instructions:  Your Physician recommend you continue on your current medication as directed.    *If you need a refill on your cardiac medications before your next appointment, please call your pharmacy*   Lab Work: None ordered today   Testing/Procedures: None ordered today   Follow-Up: At [facility], you and your health needs are our priority.  As part of our continuing mission to provide you with exceptional heart care, we have created designated Provider Care Teams.  These Care Teams include your primary Cardiologist (physician) and Advanced Practice Providers (APPs -  Physician Assistants and Nurse Practitioners) who all work together to provide you with the care you need, when you need it.  We recommend signing up for the patient portal called "MyChart".  Sign up information is provided on this After Visit Summary.  MyChart is used to connect with patients for Virtual Visits (Telemedicine).  Patients are able to view lab/test results, encounter notes, upcoming appointments, etc.  Non-urgent messages can be sent to your provider as well.   To learn more about what you can do with MyChart, go to [email or web address].    Your next appointment:   2 month(s)  The format for your next appointment:   In Person  Provider:   Bridgette Christopher, MD{      

## 2021-09-23 NOTE — Progress Notes (Signed)
Cardiology Office Note:    Date:  09/23/2021   ID:  Casey Lee, DOB 01-08-1928, MRN [id number]  PCP:  [name], MD  Cardiologist:  Buford Dresser, MD  Referring MD: [name], MD   CC: post hospital follow up  History of Present Illness:    Casey Lee is a 87 y.o. male with a hx of atrial fibrillation, temporal arteritis, high cholesterol, who is seen for follow up. I met him during his hospitalization with influenza 08/2021.   Today He is accompanied with an aide from his skilled nursing facility. Since he has come out of the  hospital he states that he feels well. He says he is still experience weakness even after the flu. He adds that he also had COVID recently.   He remains complaint with his medications, and is hoping to go home from rehab soon.  He denies any palpitations, chest pain, or shortness of breath. No lightheadedness, headaches, syncope, orthopnea, PND, lower extremity edema or exertional symptoms.  Past Medical History:  Diagnosis Date   Atrial fibrillation ([place])    Hearing loss    High cholesterol    Temporal arteritis ([facility])    TIA (transient ischemic attack)    UTI (lower urinary tract infection)    Vertigo     Past Surgical History:  Procedure Laterality Date   ARTERY BIOPSY Right 04/30/2018   Procedure: BIOPSY TEMPORAL ARTERY;  Surgeon: Izora Gala, MD;  Location: [facility];  Service: ENT;  Laterality: Right;   CATARACT EXTRACTION Right 04/28/2020   I & D EXTREMITY Right 05/12/2018   Procedure: IRRIGATION AND DEBRIDEMENT RIGHT KNEE BURSA;  Surgeon: Marchia Bond, MD;  Location: [facility];  Service: Orthopedics;  Laterality: Right;   none      Current Medications: Current Outpatient Medications on File Prior to Visit  Medication Sig   albuterol (VENTOLIN HFA) 108 (90 Base) MCG/ACT inhaler Inhale 2 puffs into the lungs every 6 (six) hours as needed for wheezing or shortness of breath.   amiodarone (PACERONE)  200 MG tablet Take 1 tablet (200 mg total) by mouth 2 (two) times daily.   apixaban (ELIQUIS) 2.5 MG TABS tablet Take 1 tablet (2.5 mg total) by mouth 2 (two) times daily.   benzonatate (TESSALON PERLES) 100 MG capsule Take 1 capsule (100 mg total) by mouth 3 (three) times daily as needed for cough.   Calcium-Phosphorus-Vitamin D (CALCIUM/D3 ADULT GUMMIES PO) Take 3 tablets by mouth See admin instructions. CHEW 3 gummies by mouth daily   Coenzyme Q10 (COQ10 PO) Take 1 capsule by mouth daily with lunch.   fluticasone (FLONASE) 50 MCG/ACT nasal spray Place 2 sprays into both nostrils daily.   loratadine (CLARITIN) 10 MG tablet Take 10 mg by mouth daily.   metoprolol succinate (TOPROL-XL) 25 MG 24 hr tablet Take 1 tablet (25 mg total) by mouth daily.   neomycin-polymyxin b-dexamethasone (MAXITROL) 3.5-10000-0.1 OINT Place 1 application into the left eye 2 (two) times daily.   predniSONE (DELTASONE) 5 MG tablet Take 5 mg by mouth daily.   rosuvastatin (CRESTOR) 10 MG tablet Take 10 mg by mouth daily.   tamsulosin (FLOMAX) 0.4 MG CAPS capsule Take 1 capsule (0.4 mg total) by mouth daily after supper.   No current facility-administered medications on file prior to visit.     Allergies:   Fosamax [alendronate] and Clopidogrel   Social History   Tobacco Use   Smoking status: Former    Types: Cigarettes  Quit date: 11/07/1952    Years since quitting: 68.9   Smokeless tobacco: Never  Vaping Use   Vaping Use: Never used  Substance Use Topics   Alcohol use: No    Alcohol/week: 0.0 standard drinks   Drug use: No    Family History: family history includes Diabetes Mellitus II in his sister; Healthy in his son; Heart failure in his father; Rheum arthritis in his father and sister.  ROS:   Please see the history of present illness. (+) Weakness All other systems are reviewed and negative.     EKGs/Labs/Other Studies Reviewed:    The following studies were reviewed today:  ECHO  08/30/2021: IMPRESSIONS    1. POor acoustic windows limit study. Laterall hypokinesis (mid/distal),  basal inferior hypokinesis.. Left ventricular ejection fraction, by  estimation, is 45 to 50%. The left ventricle has mildly decreased  function. Left ventricular diastolic  parameters are indeterminate.   2. Right ventricular systolic function is normal. The right ventricular  size is normal.   3. Mitral regurgitation is eccentric directed along posterior wall of  left atrium . Mild to moderate mitral valve regurgitation.   4. AV is thickened, calcified with restricted motion Peak and mean  gradients through the valve are 42 and 24 mm Hg respectively. Stroke  volume index is 17, suggesting this may be low gradient AS. Dimensionless  index is 18, consistent with severe AS>.  The aortic valve was not well visualized. Aortic valve regurgitation is  not visualized.   5. The inferior vena cava is normal in size with <50% respiratory  variability, suggesting right atrial pressure of 8 mmHg.   VAS US CAROTID DUPLEX BILATERAL 01/25/2016: IMPRESSIONS    1. POor acoustic windows limit study. Laterall hypokinesis (mid/distal),  basal inferior hypokinesis.. Left ventricular ejection fraction, by  estimation, is 45 to 50%. The left ventricle has mildly decreased  function. Left ventricular diastolic  parameters are indeterminate.   2. Right ventricular systolic function is normal. The right ventricular  size is normal.   3. Mitral regurgitation is eccentric directed along posterior wall of  left atrium . Mild to moderate mitral valve regurgitation.   4. AV is thickened, calcified with restricted motion Peak and mean  gradients through the valve are 42 and 24 mm Hg respectively. Stroke  volume index is 17, suggesting this may be low gradient AS. Dimensionless  index is 18, consistent with severe AS>.  The aortic valve was not well visualized. Aortic valve regurgitation is  not visualized.    5. The inferior vena cava is normal in size with <50% respiratory  variability, suggesting right atrial pressure of 8 mmHg.    EKG:  EKG is personally reviewed today.  09/23/2021: atrial fibrillation with PVCs at 83 bpm  Recent Labs: 08/29/2021: ALT 25 08/30/2021: TSH 1.414 09/01/2021: B Natriuretic Peptide 298.8; Magnesium 2.0 09/06/2021: BUN 57; Creatinine, Ser 1.69; Hemoglobin 13.3; Platelets 191; Potassium 3.7; Sodium 136  Recent Lipid Panel    Component Value Date/Time   CHOL 203 (H) 01/25/2016 0710   TRIG 54 01/25/2016 0710   HDL 50 01/25/2016 0710   CHOLHDL 4.1 01/25/2016 0710   VLDL 11 01/25/2016 0710   LDLCALC 142 (H) 01/25/2016 0710    Physical Exam:    VS:  BP 112/60    Pulse 83     Wt Readings from Last 3 Encounters:  09/05/21 112 lb 3.4 oz (50.9 kg)  04/02/21 147 lb 14.9 oz (67.1 kg)  05/18/20 140 lb (  63.5 kg)    GEN: Well nourished, well developed in no acute distress HEENT: Normal, moist mucous membranes NECK: No JVD CARDIAC: irregularly irregular rhythm, normal S1 and S2, no rubs or gallops. 2/6 systolic murmur. VASCULAR: Radial and DP pulses 2+ bilaterally. No carotid bruits RESPIRATORY:  Clear to auscultation in upper fields. Coarse at basilar R side ABDOMEN: Soft, non-tender, non-distended MUSCULOSKELETAL:  In wheelchair, moves all 4 limbs independently SKIN: Warm and dry, no edema NEUROLOGIC:  Alert and oriented x 3. No focal neuro deficits noted. PSYCHIATRIC:  Normal affect    ASSESSMENT:    1. PAF (paroxysmal atrial fibrillation) ([place])   2. History of influenza   3. Hospital discharge follow-up   4. Cardiomyopathy, unspecified type ([place])   5. Nonrheumatic aortic valve stenosis    PLAN:    Post hospital follow up Recent influenza A and MRSE bacteremia -recovering gradually, getting strength back at rehab facility  Atrial fibrillation, paroxysmal vs persistent Cardiomyopathy, unknown if ischemic or nonischemic. EF 45-50% -he cannot detect  whether he is in afib -has been on apixaban, dose reduced for age and renal function -continue metoprolol succinate for now, no room to uptitrate based on blood pressure variability (confirmed with  -would recheck echo at follow up -recent labs from nursing facility with stable renal function, potassium -has done well on oral amiodarone. Should be on 200 mg daily dosing, emphasized in notes back to facility -he is not sure if he has received furosemide. Requested med sheet from nursing facility   Possible aortic stenosis -gradient is moderate, but dimensionless index and stroke volume index suggest possibly severe -murmur does not sound severe   Cardiac risk counseling and prevention recommendations: -recommend heart healthy/Mediterranean diet, with whole grains, fruits, vegetable, fish, lean meats, nuts, and olive oil. Limit salt. -recommend moderate walking, 3-5 times/week for 30-50 minutes each session. Aim for at least 150 minutes.week. Goal should be pace of 3 miles/hours, or walking 1.5 miles in 30 minutes -recommend avoidance of tobacco products. Avoid excess alcohol. -ASCVD risk score: The ASCVD Risk score (Arnett DK, et al., 2019) failed to calculate for the following reasons:   The 2019 ASCVD risk score is only valid for ages 67 to 20    Plan for follow up: 2 months or sooner as needed   Buford Dresser, MD, PhD, [facility]    Medication Adjustments/Labs and Tests Ordered: Current medicines are reviewed at length with the patient today.  Concerns regarding medicines are outlined above.  Orders Placed This Encounter  Procedures   EKG 12-Lead   No orders of the defined types were placed in this encounter.   Patient Instructions  Medication Instructions:  Your Physician recommend you continue on your current medication as directed.    *If you need a refill on your cardiac medications before your next appointment, please call your  pharmacy*   Lab Work: None ordered today   Testing/Procedures: None ordered today   Follow-Up: At [facility], you and your health needs are our priority.  As part of our continuing mission to provide you with exceptional heart care, we have created designated Provider Care Teams.  These Care Teams include your primary Cardiologist (physician) and Advanced Practice Providers (APPs -  Physician Assistants and Nurse Practitioners) who all work together to provide you with the care you need, when you need it.  We recommend signing up for the patient portal called "MyChart".  Sign up information is provided on this  After Visit Summary.  MyChart is used to connect with patients for Virtual Visits (Telemedicine).  Patients are able to view lab/test results, encounter notes, upcoming appointments, etc.  Non-urgent messages can be sent to your provider as well.   To learn more about what you can do with MyChart, go to [email or web address].    Your next appointment:   2 month(s)  The format for your next appointment:   In Person  Provider:   Buford Dresser, MD         Rondell Reams as a scribe for Buford Dresser, MD.,have documented all relevant documentation on the behalf of Buford Dresser, MD,as directed by  Buford Dresser, MD while in the presence of Buford Dresser, MD.   I, Buford Dresser, MD, have reviewed all documentation for this visit. The documentation on 09/23/21 for the exam, diagnosis, procedures, and orders are all accurate and complete.   Signed, Buford Dresser, MD PhD 09/23/2021 3:51 PM    [facility]

## 2021-09-24 DIAGNOSIS — N390 Urinary tract infection, site not specified: Secondary | ICD-10-CM | POA: Diagnosis not present

## 2021-09-24 DIAGNOSIS — Z79899 Other long term (current) drug therapy: Secondary | ICD-10-CM | POA: Diagnosis not present

## 2021-10-04 DIAGNOSIS — J1108 Influenza due to unidentified influenza virus with specified pneumonia: Secondary | ICD-10-CM | POA: Diagnosis not present

## 2021-10-04 DIAGNOSIS — R41841 Cognitive communication deficit: Secondary | ICD-10-CM | POA: Diagnosis not present

## 2021-10-04 DIAGNOSIS — M6281 Muscle weakness (generalized): Secondary | ICD-10-CM | POA: Diagnosis not present

## 2021-10-04 DIAGNOSIS — A419 Sepsis, unspecified organism: Secondary | ICD-10-CM | POA: Diagnosis not present

## 2021-10-05 DIAGNOSIS — M6281 Muscle weakness (generalized): Secondary | ICD-10-CM | POA: Diagnosis not present

## 2021-10-05 DIAGNOSIS — A419 Sepsis, unspecified organism: Secondary | ICD-10-CM | POA: Diagnosis not present

## 2021-10-05 DIAGNOSIS — R41841 Cognitive communication deficit: Secondary | ICD-10-CM | POA: Diagnosis not present

## 2021-10-05 DIAGNOSIS — J1108 Influenza due to unidentified influenza virus with specified pneumonia: Secondary | ICD-10-CM | POA: Diagnosis not present

## 2021-10-06 DIAGNOSIS — R41841 Cognitive communication deficit: Secondary | ICD-10-CM | POA: Diagnosis not present

## 2021-10-06 DIAGNOSIS — J1108 Influenza due to unidentified influenza virus with specified pneumonia: Secondary | ICD-10-CM | POA: Diagnosis not present

## 2021-10-06 DIAGNOSIS — A419 Sepsis, unspecified organism: Secondary | ICD-10-CM | POA: Diagnosis not present

## 2021-10-06 DIAGNOSIS — M6281 Muscle weakness (generalized): Secondary | ICD-10-CM | POA: Diagnosis not present

## 2021-10-07 DIAGNOSIS — R41841 Cognitive communication deficit: Secondary | ICD-10-CM | POA: Diagnosis not present

## 2021-10-07 DIAGNOSIS — A419 Sepsis, unspecified organism: Secondary | ICD-10-CM | POA: Diagnosis not present

## 2021-10-07 DIAGNOSIS — J1108 Influenza due to unidentified influenza virus with specified pneumonia: Secondary | ICD-10-CM | POA: Diagnosis not present

## 2021-10-07 DIAGNOSIS — M6281 Muscle weakness (generalized): Secondary | ICD-10-CM | POA: Diagnosis not present

## 2021-10-08 DIAGNOSIS — R41841 Cognitive communication deficit: Secondary | ICD-10-CM | POA: Diagnosis not present

## 2021-10-08 DIAGNOSIS — J1108 Influenza due to unidentified influenza virus with specified pneumonia: Secondary | ICD-10-CM | POA: Diagnosis not present

## 2021-10-08 DIAGNOSIS — M6281 Muscle weakness (generalized): Secondary | ICD-10-CM | POA: Diagnosis not present

## 2021-10-08 DIAGNOSIS — A419 Sepsis, unspecified organism: Secondary | ICD-10-CM | POA: Diagnosis not present

## 2021-10-11 DIAGNOSIS — R41841 Cognitive communication deficit: Secondary | ICD-10-CM | POA: Diagnosis not present

## 2021-10-11 DIAGNOSIS — J1108 Influenza due to unidentified influenza virus with specified pneumonia: Secondary | ICD-10-CM | POA: Diagnosis not present

## 2021-10-11 DIAGNOSIS — A419 Sepsis, unspecified organism: Secondary | ICD-10-CM | POA: Diagnosis not present

## 2021-10-11 DIAGNOSIS — M6281 Muscle weakness (generalized): Secondary | ICD-10-CM | POA: Diagnosis not present

## 2021-10-12 DIAGNOSIS — A419 Sepsis, unspecified organism: Secondary | ICD-10-CM | POA: Diagnosis not present

## 2021-10-12 DIAGNOSIS — M6281 Muscle weakness (generalized): Secondary | ICD-10-CM | POA: Diagnosis not present

## 2021-10-12 DIAGNOSIS — J1108 Influenza due to unidentified influenza virus with specified pneumonia: Secondary | ICD-10-CM | POA: Diagnosis not present

## 2021-10-12 DIAGNOSIS — R41841 Cognitive communication deficit: Secondary | ICD-10-CM | POA: Diagnosis not present

## 2021-10-13 DIAGNOSIS — A419 Sepsis, unspecified organism: Secondary | ICD-10-CM | POA: Diagnosis not present

## 2021-10-13 DIAGNOSIS — J1108 Influenza due to unidentified influenza virus with specified pneumonia: Secondary | ICD-10-CM | POA: Diagnosis not present

## 2021-10-13 DIAGNOSIS — R41841 Cognitive communication deficit: Secondary | ICD-10-CM | POA: Diagnosis not present

## 2021-10-13 DIAGNOSIS — M6281 Muscle weakness (generalized): Secondary | ICD-10-CM | POA: Diagnosis not present

## 2021-10-14 DIAGNOSIS — R41841 Cognitive communication deficit: Secondary | ICD-10-CM | POA: Diagnosis not present

## 2021-10-14 DIAGNOSIS — J1108 Influenza due to unidentified influenza virus with specified pneumonia: Secondary | ICD-10-CM | POA: Diagnosis not present

## 2021-10-14 DIAGNOSIS — M6281 Muscle weakness (generalized): Secondary | ICD-10-CM | POA: Diagnosis not present

## 2021-10-14 DIAGNOSIS — A419 Sepsis, unspecified organism: Secondary | ICD-10-CM | POA: Diagnosis not present

## 2021-10-15 DIAGNOSIS — R41841 Cognitive communication deficit: Secondary | ICD-10-CM | POA: Diagnosis not present

## 2021-10-15 DIAGNOSIS — J1108 Influenza due to unidentified influenza virus with specified pneumonia: Secondary | ICD-10-CM | POA: Diagnosis not present

## 2021-10-15 DIAGNOSIS — M6281 Muscle weakness (generalized): Secondary | ICD-10-CM | POA: Diagnosis not present

## 2021-10-15 DIAGNOSIS — A419 Sepsis, unspecified organism: Secondary | ICD-10-CM | POA: Diagnosis not present

## 2021-10-18 DIAGNOSIS — R410 Disorientation, unspecified: Secondary | ICD-10-CM | POA: Diagnosis not present

## 2021-10-18 DIAGNOSIS — R0681 Apnea, not elsewhere classified: Secondary | ICD-10-CM | POA: Diagnosis not present

## 2021-10-18 DIAGNOSIS — Z87440 Personal history of urinary (tract) infections: Secondary | ICD-10-CM | POA: Diagnosis not present

## 2021-10-18 DIAGNOSIS — M6281 Muscle weakness (generalized): Secondary | ICD-10-CM | POA: Diagnosis not present

## 2021-10-19 DIAGNOSIS — R41841 Cognitive communication deficit: Secondary | ICD-10-CM | POA: Diagnosis not present

## 2021-10-19 DIAGNOSIS — J1108 Influenza due to unidentified influenza virus with specified pneumonia: Secondary | ICD-10-CM | POA: Diagnosis not present

## 2021-10-19 DIAGNOSIS — A419 Sepsis, unspecified organism: Secondary | ICD-10-CM | POA: Diagnosis not present

## 2021-10-19 DIAGNOSIS — M6281 Muscle weakness (generalized): Secondary | ICD-10-CM | POA: Diagnosis not present

## 2021-10-20 DIAGNOSIS — J1108 Influenza due to unidentified influenza virus with specified pneumonia: Secondary | ICD-10-CM | POA: Diagnosis not present

## 2021-10-20 DIAGNOSIS — A419 Sepsis, unspecified organism: Secondary | ICD-10-CM | POA: Diagnosis not present

## 2021-10-20 DIAGNOSIS — R41841 Cognitive communication deficit: Secondary | ICD-10-CM | POA: Diagnosis not present

## 2021-10-20 DIAGNOSIS — M6281 Muscle weakness (generalized): Secondary | ICD-10-CM | POA: Diagnosis not present

## 2021-10-20 DIAGNOSIS — N390 Urinary tract infection, site not specified: Secondary | ICD-10-CM | POA: Diagnosis not present

## 2021-10-21 DIAGNOSIS — A419 Sepsis, unspecified organism: Secondary | ICD-10-CM | POA: Diagnosis not present

## 2021-10-21 DIAGNOSIS — R41841 Cognitive communication deficit: Secondary | ICD-10-CM | POA: Diagnosis not present

## 2021-10-21 DIAGNOSIS — J1108 Influenza due to unidentified influenza virus with specified pneumonia: Secondary | ICD-10-CM | POA: Diagnosis not present

## 2021-10-21 DIAGNOSIS — M6281 Muscle weakness (generalized): Secondary | ICD-10-CM | POA: Diagnosis not present

## 2021-10-22 DIAGNOSIS — R41841 Cognitive communication deficit: Secondary | ICD-10-CM | POA: Diagnosis not present

## 2021-10-22 DIAGNOSIS — J1108 Influenza due to unidentified influenza virus with specified pneumonia: Secondary | ICD-10-CM | POA: Diagnosis not present

## 2021-10-22 DIAGNOSIS — R4182 Altered mental status, unspecified: Secondary | ICD-10-CM | POA: Diagnosis not present

## 2021-10-22 DIAGNOSIS — I517 Cardiomegaly: Secondary | ICD-10-CM | POA: Diagnosis not present

## 2021-10-22 DIAGNOSIS — M6281 Muscle weakness (generalized): Secondary | ICD-10-CM | POA: Diagnosis not present

## 2021-10-22 DIAGNOSIS — A419 Sepsis, unspecified organism: Secondary | ICD-10-CM | POA: Diagnosis not present

## 2021-10-25 DIAGNOSIS — Z9181 History of falling: Secondary | ICD-10-CM | POA: Diagnosis not present

## 2021-10-25 DIAGNOSIS — W19XXXA Unspecified fall, initial encounter: Secondary | ICD-10-CM | POA: Diagnosis not present

## 2021-10-25 DIAGNOSIS — N1832 Chronic kidney disease, stage 3b: Secondary | ICD-10-CM | POA: Diagnosis not present

## 2021-10-25 DIAGNOSIS — D649 Anemia, unspecified: Secondary | ICD-10-CM | POA: Diagnosis not present

## 2021-10-25 DIAGNOSIS — M6281 Muscle weakness (generalized): Secondary | ICD-10-CM | POA: Diagnosis not present

## 2021-10-25 DIAGNOSIS — R41841 Cognitive communication deficit: Secondary | ICD-10-CM | POA: Diagnosis not present

## 2021-10-25 DIAGNOSIS — J1108 Influenza due to unidentified influenza virus with specified pneumonia: Secondary | ICD-10-CM | POA: Diagnosis not present

## 2021-10-25 DIAGNOSIS — A419 Sepsis, unspecified organism: Secondary | ICD-10-CM | POA: Diagnosis not present

## 2021-10-25 DIAGNOSIS — R2689 Other abnormalities of gait and mobility: Secondary | ICD-10-CM | POA: Diagnosis not present

## 2021-10-25 DIAGNOSIS — R410 Disorientation, unspecified: Secondary | ICD-10-CM | POA: Diagnosis not present

## 2021-10-26 DIAGNOSIS — R41841 Cognitive communication deficit: Secondary | ICD-10-CM | POA: Diagnosis not present

## 2021-10-26 DIAGNOSIS — J1108 Influenza due to unidentified influenza virus with specified pneumonia: Secondary | ICD-10-CM | POA: Diagnosis not present

## 2021-10-26 DIAGNOSIS — A419 Sepsis, unspecified organism: Secondary | ICD-10-CM | POA: Diagnosis not present

## 2021-10-26 DIAGNOSIS — M6281 Muscle weakness (generalized): Secondary | ICD-10-CM | POA: Diagnosis not present

## 2021-10-29 DIAGNOSIS — Z8673 Personal history of transient ischemic attack (TIA), and cerebral infarction without residual deficits: Secondary | ICD-10-CM | POA: Diagnosis not present

## 2021-10-29 DIAGNOSIS — H548 Legal blindness, as defined in USA: Secondary | ICD-10-CM | POA: Diagnosis not present

## 2021-10-29 DIAGNOSIS — Z7952 Long term (current) use of systemic steroids: Secondary | ICD-10-CM | POA: Diagnosis not present

## 2021-10-29 DIAGNOSIS — Z9181 History of falling: Secondary | ICD-10-CM | POA: Diagnosis not present

## 2021-10-29 DIAGNOSIS — Z7901 Long term (current) use of anticoagulants: Secondary | ICD-10-CM | POA: Diagnosis not present

## 2021-10-29 DIAGNOSIS — N1831 Chronic kidney disease, stage 3a: Secondary | ICD-10-CM | POA: Diagnosis not present

## 2021-10-29 DIAGNOSIS — Z8616 Personal history of COVID-19: Secondary | ICD-10-CM | POA: Diagnosis not present

## 2021-10-29 DIAGNOSIS — E782 Mixed hyperlipidemia: Secondary | ICD-10-CM | POA: Diagnosis not present

## 2021-10-29 DIAGNOSIS — N400 Enlarged prostate without lower urinary tract symptoms: Secondary | ICD-10-CM | POA: Diagnosis not present

## 2021-10-29 DIAGNOSIS — I350 Nonrheumatic aortic (valve) stenosis: Secondary | ICD-10-CM | POA: Diagnosis not present

## 2021-10-29 DIAGNOSIS — Z8701 Personal history of pneumonia (recurrent): Secondary | ICD-10-CM | POA: Diagnosis not present

## 2021-10-29 DIAGNOSIS — M316 Other giant cell arteritis: Secondary | ICD-10-CM | POA: Diagnosis not present

## 2021-10-29 DIAGNOSIS — H8113 Benign paroxysmal vertigo, bilateral: Secondary | ICD-10-CM | POA: Diagnosis not present

## 2021-10-29 DIAGNOSIS — I129 Hypertensive chronic kidney disease with stage 1 through stage 4 chronic kidney disease, or unspecified chronic kidney disease: Secondary | ICD-10-CM | POA: Diagnosis not present

## 2021-10-29 DIAGNOSIS — I480 Paroxysmal atrial fibrillation: Secondary | ICD-10-CM | POA: Diagnosis not present

## 2021-11-02 DIAGNOSIS — R269 Unspecified abnormalities of gait and mobility: Secondary | ICD-10-CM | POA: Diagnosis not present

## 2021-11-02 DIAGNOSIS — H8113 Benign paroxysmal vertigo, bilateral: Secondary | ICD-10-CM | POA: Diagnosis not present

## 2021-11-02 DIAGNOSIS — E782 Mixed hyperlipidemia: Secondary | ICD-10-CM | POA: Diagnosis not present

## 2021-11-02 DIAGNOSIS — I350 Nonrheumatic aortic (valve) stenosis: Secondary | ICD-10-CM | POA: Diagnosis not present

## 2021-11-02 DIAGNOSIS — H8110 Benign paroxysmal vertigo, unspecified ear: Secondary | ICD-10-CM | POA: Diagnosis not present

## 2021-11-02 DIAGNOSIS — N400 Enlarged prostate without lower urinary tract symptoms: Secondary | ICD-10-CM | POA: Diagnosis not present

## 2021-11-02 DIAGNOSIS — I129 Hypertensive chronic kidney disease with stage 1 through stage 4 chronic kidney disease, or unspecified chronic kidney disease: Secondary | ICD-10-CM | POA: Diagnosis not present

## 2021-11-02 DIAGNOSIS — M316 Other giant cell arteritis: Secondary | ICD-10-CM | POA: Diagnosis not present

## 2021-11-02 DIAGNOSIS — N1831 Chronic kidney disease, stage 3a: Secondary | ICD-10-CM | POA: Diagnosis not present

## 2021-11-02 DIAGNOSIS — I480 Paroxysmal atrial fibrillation: Secondary | ICD-10-CM | POA: Diagnosis not present

## 2021-11-02 DIAGNOSIS — I10 Essential (primary) hypertension: Secondary | ICD-10-CM | POA: Diagnosis not present

## 2021-11-04 DIAGNOSIS — M316 Other giant cell arteritis: Secondary | ICD-10-CM | POA: Diagnosis not present

## 2021-11-04 DIAGNOSIS — I480 Paroxysmal atrial fibrillation: Secondary | ICD-10-CM | POA: Diagnosis not present

## 2021-11-04 DIAGNOSIS — I350 Nonrheumatic aortic (valve) stenosis: Secondary | ICD-10-CM | POA: Diagnosis not present

## 2021-11-04 DIAGNOSIS — H8113 Benign paroxysmal vertigo, bilateral: Secondary | ICD-10-CM | POA: Diagnosis not present

## 2021-11-04 DIAGNOSIS — I129 Hypertensive chronic kidney disease with stage 1 through stage 4 chronic kidney disease, or unspecified chronic kidney disease: Secondary | ICD-10-CM | POA: Diagnosis not present

## 2021-11-04 DIAGNOSIS — N1831 Chronic kidney disease, stage 3a: Secondary | ICD-10-CM | POA: Diagnosis not present

## 2021-11-05 DIAGNOSIS — I480 Paroxysmal atrial fibrillation: Secondary | ICD-10-CM | POA: Diagnosis not present

## 2021-11-05 DIAGNOSIS — I129 Hypertensive chronic kidney disease with stage 1 through stage 4 chronic kidney disease, or unspecified chronic kidney disease: Secondary | ICD-10-CM | POA: Diagnosis not present

## 2021-11-05 DIAGNOSIS — H8113 Benign paroxysmal vertigo, bilateral: Secondary | ICD-10-CM | POA: Diagnosis not present

## 2021-11-05 DIAGNOSIS — N1831 Chronic kidney disease, stage 3a: Secondary | ICD-10-CM | POA: Diagnosis not present

## 2021-11-05 DIAGNOSIS — I350 Nonrheumatic aortic (valve) stenosis: Secondary | ICD-10-CM | POA: Diagnosis not present

## 2021-11-05 DIAGNOSIS — M316 Other giant cell arteritis: Secondary | ICD-10-CM | POA: Diagnosis not present

## 2021-11-08 DIAGNOSIS — I480 Paroxysmal atrial fibrillation: Secondary | ICD-10-CM | POA: Diagnosis not present

## 2021-11-08 DIAGNOSIS — H8113 Benign paroxysmal vertigo, bilateral: Secondary | ICD-10-CM | POA: Diagnosis not present

## 2021-11-08 DIAGNOSIS — I350 Nonrheumatic aortic (valve) stenosis: Secondary | ICD-10-CM | POA: Diagnosis not present

## 2021-11-08 DIAGNOSIS — N1831 Chronic kidney disease, stage 3a: Secondary | ICD-10-CM | POA: Diagnosis not present

## 2021-11-08 DIAGNOSIS — M316 Other giant cell arteritis: Secondary | ICD-10-CM | POA: Diagnosis not present

## 2021-11-08 DIAGNOSIS — I129 Hypertensive chronic kidney disease with stage 1 through stage 4 chronic kidney disease, or unspecified chronic kidney disease: Secondary | ICD-10-CM | POA: Diagnosis not present

## 2021-11-09 DIAGNOSIS — H8113 Benign paroxysmal vertigo, bilateral: Secondary | ICD-10-CM | POA: Diagnosis not present

## 2021-11-09 DIAGNOSIS — I10 Essential (primary) hypertension: Secondary | ICD-10-CM | POA: Diagnosis not present

## 2021-11-09 DIAGNOSIS — M316 Other giant cell arteritis: Secondary | ICD-10-CM | POA: Diagnosis not present

## 2021-11-09 DIAGNOSIS — R269 Unspecified abnormalities of gait and mobility: Secondary | ICD-10-CM | POA: Diagnosis not present

## 2021-11-09 DIAGNOSIS — I480 Paroxysmal atrial fibrillation: Secondary | ICD-10-CM | POA: Diagnosis not present

## 2021-11-09 DIAGNOSIS — N400 Enlarged prostate without lower urinary tract symptoms: Secondary | ICD-10-CM | POA: Diagnosis not present

## 2021-11-09 DIAGNOSIS — Z79899 Other long term (current) drug therapy: Secondary | ICD-10-CM | POA: Diagnosis not present

## 2021-11-09 DIAGNOSIS — H8110 Benign paroxysmal vertigo, unspecified ear: Secondary | ICD-10-CM | POA: Diagnosis not present

## 2021-11-09 DIAGNOSIS — I129 Hypertensive chronic kidney disease with stage 1 through stage 4 chronic kidney disease, or unspecified chronic kidney disease: Secondary | ICD-10-CM | POA: Diagnosis not present

## 2021-11-09 DIAGNOSIS — N1831 Chronic kidney disease, stage 3a: Secondary | ICD-10-CM | POA: Diagnosis not present

## 2021-11-09 DIAGNOSIS — I350 Nonrheumatic aortic (valve) stenosis: Secondary | ICD-10-CM | POA: Diagnosis not present

## 2021-11-10 DIAGNOSIS — I350 Nonrheumatic aortic (valve) stenosis: Secondary | ICD-10-CM | POA: Diagnosis not present

## 2021-11-10 DIAGNOSIS — N1831 Chronic kidney disease, stage 3a: Secondary | ICD-10-CM | POA: Diagnosis not present

## 2021-11-10 DIAGNOSIS — I129 Hypertensive chronic kidney disease with stage 1 through stage 4 chronic kidney disease, or unspecified chronic kidney disease: Secondary | ICD-10-CM | POA: Diagnosis not present

## 2021-11-10 DIAGNOSIS — M316 Other giant cell arteritis: Secondary | ICD-10-CM | POA: Diagnosis not present

## 2021-11-10 DIAGNOSIS — H8113 Benign paroxysmal vertigo, bilateral: Secondary | ICD-10-CM | POA: Diagnosis not present

## 2021-11-10 DIAGNOSIS — I480 Paroxysmal atrial fibrillation: Secondary | ICD-10-CM | POA: Diagnosis not present

## 2021-11-11 DIAGNOSIS — M6281 Muscle weakness (generalized): Secondary | ICD-10-CM | POA: Diagnosis not present

## 2021-11-11 DIAGNOSIS — I639 Cerebral infarction, unspecified: Secondary | ICD-10-CM | POA: Diagnosis not present

## 2021-11-11 DIAGNOSIS — I6782 Cerebral ischemia: Secondary | ICD-10-CM | POA: Diagnosis not present

## 2021-11-11 DIAGNOSIS — I5020 Unspecified systolic (congestive) heart failure: Secondary | ICD-10-CM | POA: Diagnosis not present

## 2021-11-11 DIAGNOSIS — Z7901 Long term (current) use of anticoagulants: Secondary | ICD-10-CM | POA: Diagnosis not present

## 2021-11-11 DIAGNOSIS — G459 Transient cerebral ischemic attack, unspecified: Secondary | ICD-10-CM | POA: Diagnosis not present

## 2021-11-11 DIAGNOSIS — E785 Hyperlipidemia, unspecified: Secondary | ICD-10-CM | POA: Diagnosis not present

## 2021-11-11 DIAGNOSIS — Z7952 Long term (current) use of systemic steroids: Secondary | ICD-10-CM | POA: Diagnosis not present

## 2021-11-11 DIAGNOSIS — W19XXXA Unspecified fall, initial encounter: Secondary | ICD-10-CM | POA: Diagnosis not present

## 2021-11-11 DIAGNOSIS — H547 Unspecified visual loss: Secondary | ICD-10-CM | POA: Diagnosis not present

## 2021-11-11 DIAGNOSIS — I130 Hypertensive heart and chronic kidney disease with heart failure and stage 1 through stage 4 chronic kidney disease, or unspecified chronic kidney disease: Secondary | ICD-10-CM | POA: Diagnosis not present

## 2021-11-11 DIAGNOSIS — N189 Chronic kidney disease, unspecified: Secondary | ICD-10-CM | POA: Diagnosis not present

## 2021-11-11 DIAGNOSIS — H538 Other visual disturbances: Secondary | ICD-10-CM | POA: Diagnosis not present

## 2021-11-11 DIAGNOSIS — I509 Heart failure, unspecified: Secondary | ICD-10-CM | POA: Diagnosis not present

## 2021-11-11 DIAGNOSIS — I4891 Unspecified atrial fibrillation: Secondary | ICD-10-CM | POA: Diagnosis not present

## 2021-11-11 DIAGNOSIS — Z8616 Personal history of COVID-19: Secondary | ICD-10-CM | POA: Diagnosis not present

## 2021-11-11 DIAGNOSIS — Z79899 Other long term (current) drug therapy: Secondary | ICD-10-CM | POA: Diagnosis not present

## 2021-11-11 DIAGNOSIS — R4701 Aphasia: Secondary | ICD-10-CM | POA: Diagnosis not present

## 2021-11-11 DIAGNOSIS — N179 Acute kidney failure, unspecified: Secondary | ICD-10-CM | POA: Diagnosis not present

## 2021-11-11 DIAGNOSIS — M316 Other giant cell arteritis: Secondary | ICD-10-CM | POA: Diagnosis not present

## 2021-11-11 DIAGNOSIS — I4892 Unspecified atrial flutter: Secondary | ICD-10-CM | POA: Diagnosis not present

## 2021-11-11 DIAGNOSIS — Z9181 History of falling: Secondary | ICD-10-CM | POA: Diagnosis not present

## 2021-11-12 DIAGNOSIS — N179 Acute kidney failure, unspecified: Secondary | ICD-10-CM | POA: Diagnosis not present

## 2021-11-12 DIAGNOSIS — G459 Transient cerebral ischemic attack, unspecified: Secondary | ICD-10-CM | POA: Diagnosis not present

## 2021-11-12 DIAGNOSIS — N189 Chronic kidney disease, unspecified: Secondary | ICD-10-CM | POA: Diagnosis not present

## 2021-11-12 DIAGNOSIS — I4891 Unspecified atrial fibrillation: Secondary | ICD-10-CM | POA: Diagnosis not present

## 2021-11-12 DIAGNOSIS — I4892 Unspecified atrial flutter: Secondary | ICD-10-CM | POA: Diagnosis not present

## 2021-11-12 DIAGNOSIS — M316 Other giant cell arteritis: Secondary | ICD-10-CM | POA: Diagnosis not present

## 2021-11-12 DIAGNOSIS — I509 Heart failure, unspecified: Secondary | ICD-10-CM | POA: Diagnosis not present

## 2021-11-12 DIAGNOSIS — I130 Hypertensive heart and chronic kidney disease with heart failure and stage 1 through stage 4 chronic kidney disease, or unspecified chronic kidney disease: Secondary | ICD-10-CM | POA: Diagnosis not present

## 2021-11-12 DIAGNOSIS — E785 Hyperlipidemia, unspecified: Secondary | ICD-10-CM | POA: Diagnosis not present

## 2021-11-12 DIAGNOSIS — R4701 Aphasia: Secondary | ICD-10-CM | POA: Diagnosis not present

## 2021-11-12 DIAGNOSIS — Z9181 History of falling: Secondary | ICD-10-CM | POA: Diagnosis not present

## 2021-11-12 DIAGNOSIS — H538 Other visual disturbances: Secondary | ICD-10-CM | POA: Diagnosis not present

## 2021-11-15 DIAGNOSIS — H8113 Benign paroxysmal vertigo, bilateral: Secondary | ICD-10-CM | POA: Diagnosis not present

## 2021-11-15 DIAGNOSIS — I350 Nonrheumatic aortic (valve) stenosis: Secondary | ICD-10-CM | POA: Diagnosis not present

## 2021-11-15 DIAGNOSIS — I129 Hypertensive chronic kidney disease with stage 1 through stage 4 chronic kidney disease, or unspecified chronic kidney disease: Secondary | ICD-10-CM | POA: Diagnosis not present

## 2021-11-15 DIAGNOSIS — I480 Paroxysmal atrial fibrillation: Secondary | ICD-10-CM | POA: Diagnosis not present

## 2021-11-15 DIAGNOSIS — N1831 Chronic kidney disease, stage 3a: Secondary | ICD-10-CM | POA: Diagnosis not present

## 2021-11-15 DIAGNOSIS — M316 Other giant cell arteritis: Secondary | ICD-10-CM | POA: Diagnosis not present

## 2021-11-16 DIAGNOSIS — I480 Paroxysmal atrial fibrillation: Secondary | ICD-10-CM | POA: Diagnosis not present

## 2021-11-16 DIAGNOSIS — N390 Urinary tract infection, site not specified: Secondary | ICD-10-CM | POA: Diagnosis not present

## 2021-11-16 DIAGNOSIS — H8110 Benign paroxysmal vertigo, unspecified ear: Secondary | ICD-10-CM | POA: Diagnosis not present

## 2021-11-16 DIAGNOSIS — M316 Other giant cell arteritis: Secondary | ICD-10-CM | POA: Diagnosis not present

## 2021-11-16 DIAGNOSIS — R269 Unspecified abnormalities of gait and mobility: Secondary | ICD-10-CM | POA: Diagnosis not present

## 2021-11-16 DIAGNOSIS — Z79899 Other long term (current) drug therapy: Secondary | ICD-10-CM | POA: Diagnosis not present

## 2021-11-16 DIAGNOSIS — N400 Enlarged prostate without lower urinary tract symptoms: Secondary | ICD-10-CM | POA: Diagnosis not present

## 2021-11-16 DIAGNOSIS — G459 Transient cerebral ischemic attack, unspecified: Secondary | ICD-10-CM | POA: Diagnosis not present

## 2021-11-17 DIAGNOSIS — N1831 Chronic kidney disease, stage 3a: Secondary | ICD-10-CM | POA: Diagnosis not present

## 2021-11-17 DIAGNOSIS — H8113 Benign paroxysmal vertigo, bilateral: Secondary | ICD-10-CM | POA: Diagnosis not present

## 2021-11-17 DIAGNOSIS — I129 Hypertensive chronic kidney disease with stage 1 through stage 4 chronic kidney disease, or unspecified chronic kidney disease: Secondary | ICD-10-CM | POA: Diagnosis not present

## 2021-11-18 DIAGNOSIS — I129 Hypertensive chronic kidney disease with stage 1 through stage 4 chronic kidney disease, or unspecified chronic kidney disease: Secondary | ICD-10-CM | POA: Diagnosis not present

## 2021-11-18 DIAGNOSIS — D473 Essential (hemorrhagic) thrombocythemia: Secondary | ICD-10-CM | POA: Diagnosis not present

## 2021-11-18 DIAGNOSIS — I480 Paroxysmal atrial fibrillation: Secondary | ICD-10-CM | POA: Diagnosis not present

## 2021-11-18 DIAGNOSIS — N1831 Chronic kidney disease, stage 3a: Secondary | ICD-10-CM | POA: Diagnosis not present

## 2021-11-18 DIAGNOSIS — E569 Vitamin deficiency, unspecified: Secondary | ICD-10-CM | POA: Diagnosis not present

## 2021-11-18 DIAGNOSIS — I350 Nonrheumatic aortic (valve) stenosis: Secondary | ICD-10-CM | POA: Diagnosis not present

## 2021-11-18 DIAGNOSIS — H8113 Benign paroxysmal vertigo, bilateral: Secondary | ICD-10-CM | POA: Diagnosis not present

## 2021-11-18 DIAGNOSIS — A419 Sepsis, unspecified organism: Secondary | ICD-10-CM | POA: Diagnosis not present

## 2021-11-18 DIAGNOSIS — Z79899 Other long term (current) drug therapy: Secondary | ICD-10-CM | POA: Diagnosis not present

## 2021-11-18 DIAGNOSIS — M316 Other giant cell arteritis: Secondary | ICD-10-CM | POA: Diagnosis not present

## 2021-11-23 DIAGNOSIS — H8113 Benign paroxysmal vertigo, bilateral: Secondary | ICD-10-CM | POA: Diagnosis not present

## 2021-11-23 DIAGNOSIS — I350 Nonrheumatic aortic (valve) stenosis: Secondary | ICD-10-CM | POA: Diagnosis not present

## 2021-11-23 DIAGNOSIS — I129 Hypertensive chronic kidney disease with stage 1 through stage 4 chronic kidney disease, or unspecified chronic kidney disease: Secondary | ICD-10-CM | POA: Diagnosis not present

## 2021-11-23 DIAGNOSIS — I480 Paroxysmal atrial fibrillation: Secondary | ICD-10-CM | POA: Diagnosis not present

## 2021-11-23 DIAGNOSIS — N3000 Acute cystitis without hematuria: Secondary | ICD-10-CM | POA: Diagnosis not present

## 2021-11-23 DIAGNOSIS — N400 Enlarged prostate without lower urinary tract symptoms: Secondary | ICD-10-CM | POA: Diagnosis not present

## 2021-11-23 DIAGNOSIS — N1831 Chronic kidney disease, stage 3a: Secondary | ICD-10-CM | POA: Diagnosis not present

## 2021-11-23 DIAGNOSIS — M316 Other giant cell arteritis: Secondary | ICD-10-CM | POA: Diagnosis not present

## 2021-11-23 DIAGNOSIS — L98411 Non-pressure chronic ulcer of buttock limited to breakdown of skin: Secondary | ICD-10-CM | POA: Diagnosis not present

## 2021-11-24 DIAGNOSIS — I4892 Unspecified atrial flutter: Secondary | ICD-10-CM | POA: Diagnosis not present

## 2021-11-24 DIAGNOSIS — I5022 Chronic systolic (congestive) heart failure: Secondary | ICD-10-CM | POA: Diagnosis not present

## 2021-11-24 DIAGNOSIS — I6782 Cerebral ischemia: Secondary | ICD-10-CM | POA: Diagnosis not present

## 2021-11-24 DIAGNOSIS — I130 Hypertensive heart and chronic kidney disease with heart failure and stage 1 through stage 4 chronic kidney disease, or unspecified chronic kidney disease: Secondary | ICD-10-CM | POA: Diagnosis not present

## 2021-11-24 DIAGNOSIS — Z87440 Personal history of urinary (tract) infections: Secondary | ICD-10-CM | POA: Diagnosis not present

## 2021-11-24 DIAGNOSIS — Z792 Long term (current) use of antibiotics: Secondary | ICD-10-CM | POA: Diagnosis not present

## 2021-11-24 DIAGNOSIS — I129 Hypertensive chronic kidney disease with stage 1 through stage 4 chronic kidney disease, or unspecified chronic kidney disease: Secondary | ICD-10-CM | POA: Diagnosis not present

## 2021-11-24 DIAGNOSIS — Z79899 Other long term (current) drug therapy: Secondary | ICD-10-CM | POA: Diagnosis not present

## 2021-11-24 DIAGNOSIS — I5020 Unspecified systolic (congestive) heart failure: Secondary | ICD-10-CM | POA: Diagnosis not present

## 2021-11-24 DIAGNOSIS — R0689 Other abnormalities of breathing: Secondary | ICD-10-CM | POA: Diagnosis not present

## 2021-11-24 DIAGNOSIS — Z6821 Body mass index (BMI) 21.0-21.9, adult: Secondary | ICD-10-CM | POA: Diagnosis not present

## 2021-11-24 DIAGNOSIS — N189 Chronic kidney disease, unspecified: Secondary | ICD-10-CM | POA: Diagnosis not present

## 2021-11-24 DIAGNOSIS — R1111 Vomiting without nausea: Secondary | ICD-10-CM | POA: Diagnosis not present

## 2021-11-24 DIAGNOSIS — I4891 Unspecified atrial fibrillation: Secondary | ICD-10-CM | POA: Diagnosis not present

## 2021-11-24 DIAGNOSIS — R4182 Altered mental status, unspecified: Secondary | ICD-10-CM | POA: Diagnosis not present

## 2021-11-24 DIAGNOSIS — I429 Cardiomyopathy, unspecified: Secondary | ICD-10-CM | POA: Diagnosis not present

## 2021-11-24 DIAGNOSIS — E785 Hyperlipidemia, unspecified: Secondary | ICD-10-CM | POA: Diagnosis not present

## 2021-11-24 DIAGNOSIS — R509 Fever, unspecified: Secondary | ICD-10-CM | POA: Diagnosis not present

## 2021-11-24 DIAGNOSIS — N179 Acute kidney failure, unspecified: Secondary | ICD-10-CM | POA: Diagnosis not present

## 2021-11-24 DIAGNOSIS — Z7901 Long term (current) use of anticoagulants: Secondary | ICD-10-CM | POA: Diagnosis not present

## 2021-11-24 DIAGNOSIS — Z20822 Contact with and (suspected) exposure to covid-19: Secondary | ICD-10-CM | POA: Diagnosis not present

## 2021-11-24 DIAGNOSIS — J159 Unspecified bacterial pneumonia: Secondary | ICD-10-CM | POA: Diagnosis not present

## 2021-11-24 DIAGNOSIS — J168 Pneumonia due to other specified infectious organisms: Secondary | ICD-10-CM | POA: Diagnosis not present

## 2021-11-24 DIAGNOSIS — E1122 Type 2 diabetes mellitus with diabetic chronic kidney disease: Secondary | ICD-10-CM | POA: Diagnosis not present

## 2021-11-24 DIAGNOSIS — R918 Other nonspecific abnormal finding of lung field: Secondary | ICD-10-CM | POA: Diagnosis not present

## 2021-11-24 DIAGNOSIS — R4020 Unspecified coma: Secondary | ICD-10-CM | POA: Diagnosis not present

## 2021-11-24 DIAGNOSIS — R0902 Hypoxemia: Secondary | ICD-10-CM | POA: Diagnosis not present

## 2021-11-24 DIAGNOSIS — I672 Cerebral atherosclerosis: Secondary | ICD-10-CM | POA: Diagnosis not present

## 2021-11-25 DIAGNOSIS — I4892 Unspecified atrial flutter: Secondary | ICD-10-CM | POA: Diagnosis not present

## 2021-11-25 DIAGNOSIS — I130 Hypertensive heart and chronic kidney disease with heart failure and stage 1 through stage 4 chronic kidney disease, or unspecified chronic kidney disease: Secondary | ICD-10-CM | POA: Diagnosis not present

## 2021-11-25 DIAGNOSIS — R509 Fever, unspecified: Secondary | ICD-10-CM | POA: Diagnosis not present

## 2021-11-25 DIAGNOSIS — E785 Hyperlipidemia, unspecified: Secondary | ICD-10-CM | POA: Diagnosis not present

## 2021-11-25 DIAGNOSIS — Z7901 Long term (current) use of anticoagulants: Secondary | ICD-10-CM | POA: Diagnosis not present

## 2021-11-25 DIAGNOSIS — I5020 Unspecified systolic (congestive) heart failure: Secondary | ICD-10-CM | POA: Diagnosis not present

## 2021-11-25 DIAGNOSIS — I4891 Unspecified atrial fibrillation: Secondary | ICD-10-CM | POA: Diagnosis not present

## 2021-11-25 DIAGNOSIS — Z792 Long term (current) use of antibiotics: Secondary | ICD-10-CM | POA: Diagnosis not present

## 2021-11-25 DIAGNOSIS — Z79899 Other long term (current) drug therapy: Secondary | ICD-10-CM | POA: Diagnosis not present

## 2021-11-25 DIAGNOSIS — N189 Chronic kidney disease, unspecified: Secondary | ICD-10-CM | POA: Diagnosis not present

## 2021-11-25 DIAGNOSIS — R4182 Altered mental status, unspecified: Secondary | ICD-10-CM | POA: Diagnosis not present

## 2021-11-25 DIAGNOSIS — Z6821 Body mass index (BMI) 21.0-21.9, adult: Secondary | ICD-10-CM | POA: Diagnosis not present

## 2021-11-26 ENCOUNTER — Ambulatory Visit (HOSPITAL_BASED_OUTPATIENT_CLINIC_OR_DEPARTMENT_OTHER): Payer: Medicare Other | Admitting: Cardiology

## 2021-11-26 DIAGNOSIS — J159 Unspecified bacterial pneumonia: Secondary | ICD-10-CM | POA: Diagnosis not present

## 2021-11-26 DIAGNOSIS — Z6821 Body mass index (BMI) 21.0-21.9, adult: Secondary | ICD-10-CM | POA: Diagnosis not present

## 2021-11-26 DIAGNOSIS — E785 Hyperlipidemia, unspecified: Secondary | ICD-10-CM | POA: Diagnosis not present

## 2021-11-26 DIAGNOSIS — I4891 Unspecified atrial fibrillation: Secondary | ICD-10-CM | POA: Diagnosis not present

## 2021-11-26 DIAGNOSIS — I129 Hypertensive chronic kidney disease with stage 1 through stage 4 chronic kidney disease, or unspecified chronic kidney disease: Secondary | ICD-10-CM | POA: Diagnosis not present

## 2021-11-26 DIAGNOSIS — R509 Fever, unspecified: Secondary | ICD-10-CM | POA: Diagnosis not present

## 2021-11-26 DIAGNOSIS — R4182 Altered mental status, unspecified: Secondary | ICD-10-CM | POA: Diagnosis not present

## 2021-11-26 DIAGNOSIS — N189 Chronic kidney disease, unspecified: Secondary | ICD-10-CM | POA: Diagnosis not present

## 2021-11-28 DIAGNOSIS — Z8616 Personal history of COVID-19: Secondary | ICD-10-CM | POA: Diagnosis not present

## 2021-11-28 DIAGNOSIS — N400 Enlarged prostate without lower urinary tract symptoms: Secondary | ICD-10-CM | POA: Diagnosis not present

## 2021-11-28 DIAGNOSIS — Z9181 History of falling: Secondary | ICD-10-CM | POA: Diagnosis not present

## 2021-11-28 DIAGNOSIS — Z7901 Long term (current) use of anticoagulants: Secondary | ICD-10-CM | POA: Diagnosis not present

## 2021-11-28 DIAGNOSIS — Z8701 Personal history of pneumonia (recurrent): Secondary | ICD-10-CM | POA: Diagnosis not present

## 2021-11-28 DIAGNOSIS — M316 Other giant cell arteritis: Secondary | ICD-10-CM | POA: Diagnosis not present

## 2021-11-28 DIAGNOSIS — I480 Paroxysmal atrial fibrillation: Secondary | ICD-10-CM | POA: Diagnosis not present

## 2021-11-28 DIAGNOSIS — Z8673 Personal history of transient ischemic attack (TIA), and cerebral infarction without residual deficits: Secondary | ICD-10-CM | POA: Diagnosis not present

## 2021-11-28 DIAGNOSIS — H548 Legal blindness, as defined in USA: Secondary | ICD-10-CM | POA: Diagnosis not present

## 2021-11-28 DIAGNOSIS — I129 Hypertensive chronic kidney disease with stage 1 through stage 4 chronic kidney disease, or unspecified chronic kidney disease: Secondary | ICD-10-CM | POA: Diagnosis not present

## 2021-11-28 DIAGNOSIS — Z7952 Long term (current) use of systemic steroids: Secondary | ICD-10-CM | POA: Diagnosis not present

## 2021-11-28 DIAGNOSIS — E782 Mixed hyperlipidemia: Secondary | ICD-10-CM | POA: Diagnosis not present

## 2021-11-28 DIAGNOSIS — N1831 Chronic kidney disease, stage 3a: Secondary | ICD-10-CM | POA: Diagnosis not present

## 2021-11-28 DIAGNOSIS — I350 Nonrheumatic aortic (valve) stenosis: Secondary | ICD-10-CM | POA: Diagnosis not present

## 2021-11-28 DIAGNOSIS — H8113 Benign paroxysmal vertigo, bilateral: Secondary | ICD-10-CM | POA: Diagnosis not present

## 2021-11-29 DIAGNOSIS — H8113 Benign paroxysmal vertigo, bilateral: Secondary | ICD-10-CM | POA: Diagnosis not present

## 2021-11-29 DIAGNOSIS — N1831 Chronic kidney disease, stage 3a: Secondary | ICD-10-CM | POA: Diagnosis not present

## 2021-11-29 DIAGNOSIS — I350 Nonrheumatic aortic (valve) stenosis: Secondary | ICD-10-CM | POA: Diagnosis not present

## 2021-11-29 DIAGNOSIS — M316 Other giant cell arteritis: Secondary | ICD-10-CM | POA: Diagnosis not present

## 2021-11-29 DIAGNOSIS — I129 Hypertensive chronic kidney disease with stage 1 through stage 4 chronic kidney disease, or unspecified chronic kidney disease: Secondary | ICD-10-CM | POA: Diagnosis not present

## 2021-11-29 DIAGNOSIS — I480 Paroxysmal atrial fibrillation: Secondary | ICD-10-CM | POA: Diagnosis not present

## 2021-11-30 DIAGNOSIS — N400 Enlarged prostate without lower urinary tract symptoms: Secondary | ICD-10-CM | POA: Diagnosis not present

## 2021-11-30 DIAGNOSIS — M316 Other giant cell arteritis: Secondary | ICD-10-CM | POA: Diagnosis not present

## 2021-11-30 DIAGNOSIS — J181 Lobar pneumonia, unspecified organism: Secondary | ICD-10-CM | POA: Diagnosis not present

## 2021-11-30 DIAGNOSIS — N1831 Chronic kidney disease, stage 3a: Secondary | ICD-10-CM | POA: Diagnosis not present

## 2021-11-30 DIAGNOSIS — R269 Unspecified abnormalities of gait and mobility: Secondary | ICD-10-CM | POA: Diagnosis not present

## 2021-11-30 DIAGNOSIS — I129 Hypertensive chronic kidney disease with stage 1 through stage 4 chronic kidney disease, or unspecified chronic kidney disease: Secondary | ICD-10-CM | POA: Diagnosis not present

## 2021-12-01 DIAGNOSIS — N1831 Chronic kidney disease, stage 3a: Secondary | ICD-10-CM | POA: Diagnosis not present

## 2021-12-01 DIAGNOSIS — M316 Other giant cell arteritis: Secondary | ICD-10-CM | POA: Diagnosis not present

## 2021-12-01 DIAGNOSIS — I350 Nonrheumatic aortic (valve) stenosis: Secondary | ICD-10-CM | POA: Diagnosis not present

## 2021-12-01 DIAGNOSIS — H8113 Benign paroxysmal vertigo, bilateral: Secondary | ICD-10-CM | POA: Diagnosis not present

## 2021-12-01 DIAGNOSIS — I129 Hypertensive chronic kidney disease with stage 1 through stage 4 chronic kidney disease, or unspecified chronic kidney disease: Secondary | ICD-10-CM | POA: Diagnosis not present

## 2021-12-01 DIAGNOSIS — I480 Paroxysmal atrial fibrillation: Secondary | ICD-10-CM | POA: Diagnosis not present

## 2021-12-03 DIAGNOSIS — M316 Other giant cell arteritis: Secondary | ICD-10-CM | POA: Diagnosis not present

## 2021-12-03 DIAGNOSIS — H8113 Benign paroxysmal vertigo, bilateral: Secondary | ICD-10-CM | POA: Diagnosis not present

## 2021-12-03 DIAGNOSIS — I129 Hypertensive chronic kidney disease with stage 1 through stage 4 chronic kidney disease, or unspecified chronic kidney disease: Secondary | ICD-10-CM | POA: Diagnosis not present

## 2021-12-03 DIAGNOSIS — I350 Nonrheumatic aortic (valve) stenosis: Secondary | ICD-10-CM | POA: Diagnosis not present

## 2021-12-03 DIAGNOSIS — N1831 Chronic kidney disease, stage 3a: Secondary | ICD-10-CM | POA: Diagnosis not present

## 2021-12-03 DIAGNOSIS — I480 Paroxysmal atrial fibrillation: Secondary | ICD-10-CM | POA: Diagnosis not present

## 2021-12-06 DIAGNOSIS — I350 Nonrheumatic aortic (valve) stenosis: Secondary | ICD-10-CM | POA: Diagnosis not present

## 2021-12-06 DIAGNOSIS — N1831 Chronic kidney disease, stage 3a: Secondary | ICD-10-CM | POA: Diagnosis not present

## 2021-12-06 DIAGNOSIS — I480 Paroxysmal atrial fibrillation: Secondary | ICD-10-CM | POA: Diagnosis not present

## 2021-12-06 DIAGNOSIS — I129 Hypertensive chronic kidney disease with stage 1 through stage 4 chronic kidney disease, or unspecified chronic kidney disease: Secondary | ICD-10-CM | POA: Diagnosis not present

## 2021-12-06 DIAGNOSIS — H8113 Benign paroxysmal vertigo, bilateral: Secondary | ICD-10-CM | POA: Diagnosis not present

## 2021-12-06 DIAGNOSIS — M316 Other giant cell arteritis: Secondary | ICD-10-CM | POA: Diagnosis not present

## 2021-12-07 DIAGNOSIS — I480 Paroxysmal atrial fibrillation: Secondary | ICD-10-CM | POA: Diagnosis not present

## 2021-12-07 DIAGNOSIS — N400 Enlarged prostate without lower urinary tract symptoms: Secondary | ICD-10-CM | POA: Diagnosis not present

## 2021-12-07 DIAGNOSIS — M316 Other giant cell arteritis: Secondary | ICD-10-CM | POA: Diagnosis not present

## 2021-12-07 DIAGNOSIS — R269 Unspecified abnormalities of gait and mobility: Secondary | ICD-10-CM | POA: Diagnosis not present

## 2021-12-07 DIAGNOSIS — N1831 Chronic kidney disease, stage 3a: Secondary | ICD-10-CM | POA: Diagnosis not present

## 2021-12-07 DIAGNOSIS — L98411 Non-pressure chronic ulcer of buttock limited to breakdown of skin: Secondary | ICD-10-CM | POA: Diagnosis not present

## 2021-12-07 DIAGNOSIS — I129 Hypertensive chronic kidney disease with stage 1 through stage 4 chronic kidney disease, or unspecified chronic kidney disease: Secondary | ICD-10-CM | POA: Diagnosis not present

## 2021-12-07 DIAGNOSIS — E782 Mixed hyperlipidemia: Secondary | ICD-10-CM | POA: Diagnosis not present

## 2021-12-08 DIAGNOSIS — N1831 Chronic kidney disease, stage 3a: Secondary | ICD-10-CM | POA: Diagnosis not present

## 2021-12-08 DIAGNOSIS — H8113 Benign paroxysmal vertigo, bilateral: Secondary | ICD-10-CM | POA: Diagnosis not present

## 2021-12-08 DIAGNOSIS — M316 Other giant cell arteritis: Secondary | ICD-10-CM | POA: Diagnosis not present

## 2021-12-08 DIAGNOSIS — I129 Hypertensive chronic kidney disease with stage 1 through stage 4 chronic kidney disease, or unspecified chronic kidney disease: Secondary | ICD-10-CM | POA: Diagnosis not present

## 2021-12-08 DIAGNOSIS — I350 Nonrheumatic aortic (valve) stenosis: Secondary | ICD-10-CM | POA: Diagnosis not present

## 2021-12-08 DIAGNOSIS — I480 Paroxysmal atrial fibrillation: Secondary | ICD-10-CM | POA: Diagnosis not present

## 2021-12-10 DIAGNOSIS — N1831 Chronic kidney disease, stage 3a: Secondary | ICD-10-CM | POA: Diagnosis not present

## 2021-12-10 DIAGNOSIS — I129 Hypertensive chronic kidney disease with stage 1 through stage 4 chronic kidney disease, or unspecified chronic kidney disease: Secondary | ICD-10-CM | POA: Diagnosis not present

## 2021-12-10 DIAGNOSIS — M316 Other giant cell arteritis: Secondary | ICD-10-CM | POA: Diagnosis not present

## 2021-12-10 DIAGNOSIS — I350 Nonrheumatic aortic (valve) stenosis: Secondary | ICD-10-CM | POA: Diagnosis not present

## 2021-12-10 DIAGNOSIS — H8113 Benign paroxysmal vertigo, bilateral: Secondary | ICD-10-CM | POA: Diagnosis not present

## 2021-12-10 DIAGNOSIS — I480 Paroxysmal atrial fibrillation: Secondary | ICD-10-CM | POA: Diagnosis not present

## 2021-12-13 DIAGNOSIS — H8113 Benign paroxysmal vertigo, bilateral: Secondary | ICD-10-CM | POA: Diagnosis not present

## 2021-12-13 DIAGNOSIS — I480 Paroxysmal atrial fibrillation: Secondary | ICD-10-CM | POA: Diagnosis not present

## 2021-12-13 DIAGNOSIS — N1831 Chronic kidney disease, stage 3a: Secondary | ICD-10-CM | POA: Diagnosis not present

## 2021-12-13 DIAGNOSIS — I129 Hypertensive chronic kidney disease with stage 1 through stage 4 chronic kidney disease, or unspecified chronic kidney disease: Secondary | ICD-10-CM | POA: Diagnosis not present

## 2021-12-13 DIAGNOSIS — I350 Nonrheumatic aortic (valve) stenosis: Secondary | ICD-10-CM | POA: Diagnosis not present

## 2021-12-13 DIAGNOSIS — M316 Other giant cell arteritis: Secondary | ICD-10-CM | POA: Diagnosis not present

## 2021-12-14 DIAGNOSIS — M316 Other giant cell arteritis: Secondary | ICD-10-CM | POA: Diagnosis not present

## 2021-12-14 DIAGNOSIS — I350 Nonrheumatic aortic (valve) stenosis: Secondary | ICD-10-CM | POA: Diagnosis not present

## 2021-12-14 DIAGNOSIS — H8113 Benign paroxysmal vertigo, bilateral: Secondary | ICD-10-CM | POA: Diagnosis not present

## 2021-12-14 DIAGNOSIS — I480 Paroxysmal atrial fibrillation: Secondary | ICD-10-CM | POA: Diagnosis not present

## 2021-12-14 DIAGNOSIS — N1831 Chronic kidney disease, stage 3a: Secondary | ICD-10-CM | POA: Diagnosis not present

## 2021-12-14 DIAGNOSIS — I129 Hypertensive chronic kidney disease with stage 1 through stage 4 chronic kidney disease, or unspecified chronic kidney disease: Secondary | ICD-10-CM | POA: Diagnosis not present

## 2021-12-16 DIAGNOSIS — I129 Hypertensive chronic kidney disease with stage 1 through stage 4 chronic kidney disease, or unspecified chronic kidney disease: Secondary | ICD-10-CM | POA: Diagnosis not present

## 2021-12-16 DIAGNOSIS — M316 Other giant cell arteritis: Secondary | ICD-10-CM | POA: Diagnosis not present

## 2021-12-16 DIAGNOSIS — I350 Nonrheumatic aortic (valve) stenosis: Secondary | ICD-10-CM | POA: Diagnosis not present

## 2021-12-16 DIAGNOSIS — N1831 Chronic kidney disease, stage 3a: Secondary | ICD-10-CM | POA: Diagnosis not present

## 2021-12-16 DIAGNOSIS — H8113 Benign paroxysmal vertigo, bilateral: Secondary | ICD-10-CM | POA: Diagnosis not present

## 2021-12-16 DIAGNOSIS — I480 Paroxysmal atrial fibrillation: Secondary | ICD-10-CM | POA: Diagnosis not present

## 2021-12-21 DIAGNOSIS — I480 Paroxysmal atrial fibrillation: Secondary | ICD-10-CM | POA: Diagnosis not present

## 2021-12-21 DIAGNOSIS — I350 Nonrheumatic aortic (valve) stenosis: Secondary | ICD-10-CM | POA: Diagnosis not present

## 2021-12-21 DIAGNOSIS — I129 Hypertensive chronic kidney disease with stage 1 through stage 4 chronic kidney disease, or unspecified chronic kidney disease: Secondary | ICD-10-CM | POA: Diagnosis not present

## 2021-12-21 DIAGNOSIS — M316 Other giant cell arteritis: Secondary | ICD-10-CM | POA: Diagnosis not present

## 2021-12-21 DIAGNOSIS — H8113 Benign paroxysmal vertigo, bilateral: Secondary | ICD-10-CM | POA: Diagnosis not present

## 2021-12-21 DIAGNOSIS — N1831 Chronic kidney disease, stage 3a: Secondary | ICD-10-CM | POA: Diagnosis not present

## 2021-12-23 DIAGNOSIS — I350 Nonrheumatic aortic (valve) stenosis: Secondary | ICD-10-CM | POA: Diagnosis not present

## 2021-12-23 DIAGNOSIS — M316 Other giant cell arteritis: Secondary | ICD-10-CM | POA: Diagnosis not present

## 2021-12-23 DIAGNOSIS — I129 Hypertensive chronic kidney disease with stage 1 through stage 4 chronic kidney disease, or unspecified chronic kidney disease: Secondary | ICD-10-CM | POA: Diagnosis not present

## 2021-12-23 DIAGNOSIS — H8113 Benign paroxysmal vertigo, bilateral: Secondary | ICD-10-CM | POA: Diagnosis not present

## 2021-12-23 DIAGNOSIS — N1831 Chronic kidney disease, stage 3a: Secondary | ICD-10-CM | POA: Diagnosis not present

## 2021-12-23 DIAGNOSIS — I480 Paroxysmal atrial fibrillation: Secondary | ICD-10-CM | POA: Diagnosis not present

## 2021-12-24 DIAGNOSIS — I480 Paroxysmal atrial fibrillation: Secondary | ICD-10-CM | POA: Diagnosis not present

## 2021-12-24 DIAGNOSIS — M316 Other giant cell arteritis: Secondary | ICD-10-CM | POA: Diagnosis not present

## 2021-12-24 DIAGNOSIS — I350 Nonrheumatic aortic (valve) stenosis: Secondary | ICD-10-CM | POA: Diagnosis not present

## 2021-12-24 DIAGNOSIS — N1831 Chronic kidney disease, stage 3a: Secondary | ICD-10-CM | POA: Diagnosis not present

## 2021-12-24 DIAGNOSIS — H8113 Benign paroxysmal vertigo, bilateral: Secondary | ICD-10-CM | POA: Diagnosis not present

## 2021-12-24 DIAGNOSIS — I129 Hypertensive chronic kidney disease with stage 1 through stage 4 chronic kidney disease, or unspecified chronic kidney disease: Secondary | ICD-10-CM | POA: Diagnosis not present

## 2021-12-28 DIAGNOSIS — E782 Mixed hyperlipidemia: Secondary | ICD-10-CM | POA: Diagnosis not present

## 2021-12-28 DIAGNOSIS — Z7952 Long term (current) use of systemic steroids: Secondary | ICD-10-CM | POA: Diagnosis not present

## 2021-12-28 DIAGNOSIS — I4891 Unspecified atrial fibrillation: Secondary | ICD-10-CM | POA: Diagnosis not present

## 2021-12-28 DIAGNOSIS — Z7901 Long term (current) use of anticoagulants: Secondary | ICD-10-CM | POA: Diagnosis not present

## 2021-12-28 DIAGNOSIS — I5020 Unspecified systolic (congestive) heart failure: Secondary | ICD-10-CM | POA: Diagnosis not present

## 2021-12-28 DIAGNOSIS — H548 Legal blindness, as defined in USA: Secondary | ICD-10-CM | POA: Diagnosis not present

## 2021-12-28 DIAGNOSIS — I480 Paroxysmal atrial fibrillation: Secondary | ICD-10-CM | POA: Diagnosis not present

## 2021-12-28 DIAGNOSIS — N390 Urinary tract infection, site not specified: Secondary | ICD-10-CM | POA: Diagnosis not present

## 2021-12-28 DIAGNOSIS — Z7951 Long term (current) use of inhaled steroids: Secondary | ICD-10-CM | POA: Diagnosis not present

## 2021-12-28 DIAGNOSIS — Z8673 Personal history of transient ischemic attack (TIA), and cerebral infarction without residual deficits: Secondary | ICD-10-CM | POA: Diagnosis not present

## 2021-12-28 DIAGNOSIS — J159 Unspecified bacterial pneumonia: Secondary | ICD-10-CM | POA: Diagnosis not present

## 2021-12-28 DIAGNOSIS — Z9181 History of falling: Secondary | ICD-10-CM | POA: Diagnosis not present

## 2021-12-28 DIAGNOSIS — M316 Other giant cell arteritis: Secondary | ICD-10-CM | POA: Diagnosis not present

## 2021-12-28 DIAGNOSIS — I350 Nonrheumatic aortic (valve) stenosis: Secondary | ICD-10-CM | POA: Diagnosis not present

## 2021-12-28 DIAGNOSIS — N400 Enlarged prostate without lower urinary tract symptoms: Secondary | ICD-10-CM | POA: Diagnosis not present

## 2021-12-28 DIAGNOSIS — I4892 Unspecified atrial flutter: Secondary | ICD-10-CM | POA: Diagnosis not present

## 2021-12-28 DIAGNOSIS — H8113 Benign paroxysmal vertigo, bilateral: Secondary | ICD-10-CM | POA: Diagnosis not present

## 2021-12-28 DIAGNOSIS — I130 Hypertensive heart and chronic kidney disease with heart failure and stage 1 through stage 4 chronic kidney disease, or unspecified chronic kidney disease: Secondary | ICD-10-CM | POA: Diagnosis not present

## 2021-12-28 DIAGNOSIS — Z8616 Personal history of COVID-19: Secondary | ICD-10-CM | POA: Diagnosis not present

## 2021-12-28 DIAGNOSIS — Z8701 Personal history of pneumonia (recurrent): Secondary | ICD-10-CM | POA: Diagnosis not present

## 2021-12-28 DIAGNOSIS — N1831 Chronic kidney disease, stage 3a: Secondary | ICD-10-CM | POA: Diagnosis not present

## 2021-12-28 DIAGNOSIS — I429 Cardiomyopathy, unspecified: Secondary | ICD-10-CM | POA: Diagnosis not present

## 2021-12-30 DIAGNOSIS — I130 Hypertensive heart and chronic kidney disease with heart failure and stage 1 through stage 4 chronic kidney disease, or unspecified chronic kidney disease: Secondary | ICD-10-CM | POA: Diagnosis not present

## 2021-12-30 DIAGNOSIS — N1831 Chronic kidney disease, stage 3a: Secondary | ICD-10-CM | POA: Diagnosis not present

## 2021-12-30 DIAGNOSIS — H8113 Benign paroxysmal vertigo, bilateral: Secondary | ICD-10-CM | POA: Diagnosis not present

## 2021-12-30 DIAGNOSIS — J159 Unspecified bacterial pneumonia: Secondary | ICD-10-CM | POA: Diagnosis not present

## 2021-12-30 DIAGNOSIS — M316 Other giant cell arteritis: Secondary | ICD-10-CM | POA: Diagnosis not present

## 2021-12-30 DIAGNOSIS — I5020 Unspecified systolic (congestive) heart failure: Secondary | ICD-10-CM | POA: Diagnosis not present

## 2021-12-31 DIAGNOSIS — L98411 Non-pressure chronic ulcer of buttock limited to breakdown of skin: Secondary | ICD-10-CM | POA: Diagnosis not present

## 2021-12-31 DIAGNOSIS — I129 Hypertensive chronic kidney disease with stage 1 through stage 4 chronic kidney disease, or unspecified chronic kidney disease: Secondary | ICD-10-CM | POA: Diagnosis not present

## 2022-01-03 DIAGNOSIS — M316 Other giant cell arteritis: Secondary | ICD-10-CM | POA: Diagnosis not present

## 2022-01-03 DIAGNOSIS — I5020 Unspecified systolic (congestive) heart failure: Secondary | ICD-10-CM | POA: Diagnosis not present

## 2022-01-03 DIAGNOSIS — N1831 Chronic kidney disease, stage 3a: Secondary | ICD-10-CM | POA: Diagnosis not present

## 2022-01-03 DIAGNOSIS — I130 Hypertensive heart and chronic kidney disease with heart failure and stage 1 through stage 4 chronic kidney disease, or unspecified chronic kidney disease: Secondary | ICD-10-CM | POA: Diagnosis not present

## 2022-01-03 DIAGNOSIS — H8113 Benign paroxysmal vertigo, bilateral: Secondary | ICD-10-CM | POA: Diagnosis not present

## 2022-01-03 DIAGNOSIS — J159 Unspecified bacterial pneumonia: Secondary | ICD-10-CM | POA: Diagnosis not present

## 2022-01-04 DIAGNOSIS — I129 Hypertensive chronic kidney disease with stage 1 through stage 4 chronic kidney disease, or unspecified chronic kidney disease: Secondary | ICD-10-CM | POA: Diagnosis not present

## 2022-01-04 DIAGNOSIS — N1831 Chronic kidney disease, stage 3a: Secondary | ICD-10-CM | POA: Diagnosis not present

## 2022-01-04 DIAGNOSIS — I480 Paroxysmal atrial fibrillation: Secondary | ICD-10-CM | POA: Diagnosis not present

## 2022-01-04 DIAGNOSIS — L98411 Non-pressure chronic ulcer of buttock limited to breakdown of skin: Secondary | ICD-10-CM | POA: Diagnosis not present

## 2022-01-04 DIAGNOSIS — R269 Unspecified abnormalities of gait and mobility: Secondary | ICD-10-CM | POA: Diagnosis not present

## 2022-01-04 DIAGNOSIS — N400 Enlarged prostate without lower urinary tract symptoms: Secondary | ICD-10-CM | POA: Diagnosis not present

## 2022-01-04 DIAGNOSIS — M316 Other giant cell arteritis: Secondary | ICD-10-CM | POA: Diagnosis not present

## 2022-01-05 DIAGNOSIS — H8113 Benign paroxysmal vertigo, bilateral: Secondary | ICD-10-CM | POA: Diagnosis not present

## 2022-01-05 DIAGNOSIS — J159 Unspecified bacterial pneumonia: Secondary | ICD-10-CM | POA: Diagnosis not present

## 2022-01-05 DIAGNOSIS — M316 Other giant cell arteritis: Secondary | ICD-10-CM | POA: Diagnosis not present

## 2022-01-05 DIAGNOSIS — I130 Hypertensive heart and chronic kidney disease with heart failure and stage 1 through stage 4 chronic kidney disease, or unspecified chronic kidney disease: Secondary | ICD-10-CM | POA: Diagnosis not present

## 2022-01-05 DIAGNOSIS — N1831 Chronic kidney disease, stage 3a: Secondary | ICD-10-CM | POA: Diagnosis not present

## 2022-01-05 DIAGNOSIS — I5020 Unspecified systolic (congestive) heart failure: Secondary | ICD-10-CM | POA: Diagnosis not present

## 2022-01-06 DIAGNOSIS — I5020 Unspecified systolic (congestive) heart failure: Secondary | ICD-10-CM | POA: Diagnosis not present

## 2022-01-06 DIAGNOSIS — M316 Other giant cell arteritis: Secondary | ICD-10-CM | POA: Diagnosis not present

## 2022-01-06 DIAGNOSIS — I130 Hypertensive heart and chronic kidney disease with heart failure and stage 1 through stage 4 chronic kidney disease, or unspecified chronic kidney disease: Secondary | ICD-10-CM | POA: Diagnosis not present

## 2022-01-06 DIAGNOSIS — N1831 Chronic kidney disease, stage 3a: Secondary | ICD-10-CM | POA: Diagnosis not present

## 2022-01-06 DIAGNOSIS — H8113 Benign paroxysmal vertigo, bilateral: Secondary | ICD-10-CM | POA: Diagnosis not present

## 2022-01-06 DIAGNOSIS — J159 Unspecified bacterial pneumonia: Secondary | ICD-10-CM | POA: Diagnosis not present

## 2022-01-10 DIAGNOSIS — I130 Hypertensive heart and chronic kidney disease with heart failure and stage 1 through stage 4 chronic kidney disease, or unspecified chronic kidney disease: Secondary | ICD-10-CM | POA: Diagnosis not present

## 2022-01-10 DIAGNOSIS — J159 Unspecified bacterial pneumonia: Secondary | ICD-10-CM | POA: Diagnosis not present

## 2022-01-10 DIAGNOSIS — I5020 Unspecified systolic (congestive) heart failure: Secondary | ICD-10-CM | POA: Diagnosis not present

## 2022-01-10 DIAGNOSIS — H8113 Benign paroxysmal vertigo, bilateral: Secondary | ICD-10-CM | POA: Diagnosis not present

## 2022-01-10 DIAGNOSIS — N1831 Chronic kidney disease, stage 3a: Secondary | ICD-10-CM | POA: Diagnosis not present

## 2022-01-10 DIAGNOSIS — M316 Other giant cell arteritis: Secondary | ICD-10-CM | POA: Diagnosis not present

## 2022-01-11 DIAGNOSIS — N1831 Chronic kidney disease, stage 3a: Secondary | ICD-10-CM | POA: Diagnosis not present

## 2022-01-11 DIAGNOSIS — H8113 Benign paroxysmal vertigo, bilateral: Secondary | ICD-10-CM | POA: Diagnosis not present

## 2022-01-11 DIAGNOSIS — J159 Unspecified bacterial pneumonia: Secondary | ICD-10-CM | POA: Diagnosis not present

## 2022-01-11 DIAGNOSIS — I5020 Unspecified systolic (congestive) heart failure: Secondary | ICD-10-CM | POA: Diagnosis not present

## 2022-01-11 DIAGNOSIS — I130 Hypertensive heart and chronic kidney disease with heart failure and stage 1 through stage 4 chronic kidney disease, or unspecified chronic kidney disease: Secondary | ICD-10-CM | POA: Diagnosis not present

## 2022-01-11 DIAGNOSIS — M316 Other giant cell arteritis: Secondary | ICD-10-CM | POA: Diagnosis not present

## 2022-01-12 DIAGNOSIS — H8113 Benign paroxysmal vertigo, bilateral: Secondary | ICD-10-CM | POA: Diagnosis not present

## 2022-01-12 DIAGNOSIS — J159 Unspecified bacterial pneumonia: Secondary | ICD-10-CM | POA: Diagnosis not present

## 2022-01-12 DIAGNOSIS — I130 Hypertensive heart and chronic kidney disease with heart failure and stage 1 through stage 4 chronic kidney disease, or unspecified chronic kidney disease: Secondary | ICD-10-CM | POA: Diagnosis not present

## 2022-01-13 DIAGNOSIS — N1831 Chronic kidney disease, stage 3a: Secondary | ICD-10-CM | POA: Diagnosis not present

## 2022-01-13 DIAGNOSIS — I130 Hypertensive heart and chronic kidney disease with heart failure and stage 1 through stage 4 chronic kidney disease, or unspecified chronic kidney disease: Secondary | ICD-10-CM | POA: Diagnosis not present

## 2022-01-13 DIAGNOSIS — H8113 Benign paroxysmal vertigo, bilateral: Secondary | ICD-10-CM | POA: Diagnosis not present

## 2022-01-13 DIAGNOSIS — I5020 Unspecified systolic (congestive) heart failure: Secondary | ICD-10-CM | POA: Diagnosis not present

## 2022-01-13 DIAGNOSIS — M316 Other giant cell arteritis: Secondary | ICD-10-CM | POA: Diagnosis not present

## 2022-01-13 DIAGNOSIS — J159 Unspecified bacterial pneumonia: Secondary | ICD-10-CM | POA: Diagnosis not present

## 2022-01-17 DIAGNOSIS — H8113 Benign paroxysmal vertigo, bilateral: Secondary | ICD-10-CM | POA: Diagnosis not present

## 2022-01-17 DIAGNOSIS — N1831 Chronic kidney disease, stage 3a: Secondary | ICD-10-CM | POA: Diagnosis not present

## 2022-01-17 DIAGNOSIS — M316 Other giant cell arteritis: Secondary | ICD-10-CM | POA: Diagnosis not present

## 2022-01-17 DIAGNOSIS — I5020 Unspecified systolic (congestive) heart failure: Secondary | ICD-10-CM | POA: Diagnosis not present

## 2022-01-17 DIAGNOSIS — J159 Unspecified bacterial pneumonia: Secondary | ICD-10-CM | POA: Diagnosis not present

## 2022-01-17 DIAGNOSIS — I130 Hypertensive heart and chronic kidney disease with heart failure and stage 1 through stage 4 chronic kidney disease, or unspecified chronic kidney disease: Secondary | ICD-10-CM | POA: Diagnosis not present

## 2022-01-18 DIAGNOSIS — H8113 Benign paroxysmal vertigo, bilateral: Secondary | ICD-10-CM | POA: Diagnosis not present

## 2022-01-18 DIAGNOSIS — M316 Other giant cell arteritis: Secondary | ICD-10-CM | POA: Diagnosis not present

## 2022-01-18 DIAGNOSIS — I5020 Unspecified systolic (congestive) heart failure: Secondary | ICD-10-CM | POA: Diagnosis not present

## 2022-01-18 DIAGNOSIS — J159 Unspecified bacterial pneumonia: Secondary | ICD-10-CM | POA: Diagnosis not present

## 2022-01-18 DIAGNOSIS — I130 Hypertensive heart and chronic kidney disease with heart failure and stage 1 through stage 4 chronic kidney disease, or unspecified chronic kidney disease: Secondary | ICD-10-CM | POA: Diagnosis not present

## 2022-01-18 DIAGNOSIS — N1831 Chronic kidney disease, stage 3a: Secondary | ICD-10-CM | POA: Diagnosis not present

## 2022-01-24 DIAGNOSIS — E569 Vitamin deficiency, unspecified: Secondary | ICD-10-CM | POA: Diagnosis not present

## 2022-01-24 DIAGNOSIS — D473 Essential (hemorrhagic) thrombocythemia: Secondary | ICD-10-CM | POA: Diagnosis not present

## 2022-01-25 DIAGNOSIS — M316 Other giant cell arteritis: Secondary | ICD-10-CM | POA: Diagnosis not present

## 2022-01-25 DIAGNOSIS — J159 Unspecified bacterial pneumonia: Secondary | ICD-10-CM | POA: Diagnosis not present

## 2022-01-25 DIAGNOSIS — I5020 Unspecified systolic (congestive) heart failure: Secondary | ICD-10-CM | POA: Diagnosis not present

## 2022-01-25 DIAGNOSIS — N1831 Chronic kidney disease, stage 3a: Secondary | ICD-10-CM | POA: Diagnosis not present

## 2022-01-25 DIAGNOSIS — I130 Hypertensive heart and chronic kidney disease with heart failure and stage 1 through stage 4 chronic kidney disease, or unspecified chronic kidney disease: Secondary | ICD-10-CM | POA: Diagnosis not present

## 2022-01-25 DIAGNOSIS — H8113 Benign paroxysmal vertigo, bilateral: Secondary | ICD-10-CM | POA: Diagnosis not present

## 2022-01-27 DIAGNOSIS — I130 Hypertensive heart and chronic kidney disease with heart failure and stage 1 through stage 4 chronic kidney disease, or unspecified chronic kidney disease: Secondary | ICD-10-CM | POA: Diagnosis not present

## 2022-01-27 DIAGNOSIS — Z7952 Long term (current) use of systemic steroids: Secondary | ICD-10-CM | POA: Diagnosis not present

## 2022-01-27 DIAGNOSIS — I350 Nonrheumatic aortic (valve) stenosis: Secondary | ICD-10-CM | POA: Diagnosis not present

## 2022-01-27 DIAGNOSIS — E782 Mixed hyperlipidemia: Secondary | ICD-10-CM | POA: Diagnosis not present

## 2022-01-27 DIAGNOSIS — I429 Cardiomyopathy, unspecified: Secondary | ICD-10-CM | POA: Diagnosis not present

## 2022-01-27 DIAGNOSIS — I4892 Unspecified atrial flutter: Secondary | ICD-10-CM | POA: Diagnosis not present

## 2022-01-27 DIAGNOSIS — N390 Urinary tract infection, site not specified: Secondary | ICD-10-CM | POA: Diagnosis not present

## 2022-01-27 DIAGNOSIS — M316 Other giant cell arteritis: Secondary | ICD-10-CM | POA: Diagnosis not present

## 2022-01-27 DIAGNOSIS — Z8616 Personal history of COVID-19: Secondary | ICD-10-CM | POA: Diagnosis not present

## 2022-01-27 DIAGNOSIS — H548 Legal blindness, as defined in USA: Secondary | ICD-10-CM | POA: Diagnosis not present

## 2022-01-27 DIAGNOSIS — Z7901 Long term (current) use of anticoagulants: Secondary | ICD-10-CM | POA: Diagnosis not present

## 2022-01-27 DIAGNOSIS — Z7951 Long term (current) use of inhaled steroids: Secondary | ICD-10-CM | POA: Diagnosis not present

## 2022-01-27 DIAGNOSIS — I480 Paroxysmal atrial fibrillation: Secondary | ICD-10-CM | POA: Diagnosis not present

## 2022-01-27 DIAGNOSIS — N400 Enlarged prostate without lower urinary tract symptoms: Secondary | ICD-10-CM | POA: Diagnosis not present

## 2022-01-27 DIAGNOSIS — H8113 Benign paroxysmal vertigo, bilateral: Secondary | ICD-10-CM | POA: Diagnosis not present

## 2022-01-27 DIAGNOSIS — Z8673 Personal history of transient ischemic attack (TIA), and cerebral infarction without residual deficits: Secondary | ICD-10-CM | POA: Diagnosis not present

## 2022-01-27 DIAGNOSIS — I4891 Unspecified atrial fibrillation: Secondary | ICD-10-CM | POA: Diagnosis not present

## 2022-01-27 DIAGNOSIS — I5020 Unspecified systolic (congestive) heart failure: Secondary | ICD-10-CM | POA: Diagnosis not present

## 2022-01-27 DIAGNOSIS — Z9181 History of falling: Secondary | ICD-10-CM | POA: Diagnosis not present

## 2022-01-27 DIAGNOSIS — N1831 Chronic kidney disease, stage 3a: Secondary | ICD-10-CM | POA: Diagnosis not present

## 2022-01-27 DIAGNOSIS — Z8701 Personal history of pneumonia (recurrent): Secondary | ICD-10-CM | POA: Diagnosis not present

## 2022-01-27 DIAGNOSIS — J159 Unspecified bacterial pneumonia: Secondary | ICD-10-CM | POA: Diagnosis not present

## 2022-01-31 DIAGNOSIS — H8113 Benign paroxysmal vertigo, bilateral: Secondary | ICD-10-CM | POA: Diagnosis not present

## 2022-01-31 DIAGNOSIS — I5020 Unspecified systolic (congestive) heart failure: Secondary | ICD-10-CM | POA: Diagnosis not present

## 2022-01-31 DIAGNOSIS — N1831 Chronic kidney disease, stage 3a: Secondary | ICD-10-CM | POA: Diagnosis not present

## 2022-01-31 DIAGNOSIS — J159 Unspecified bacterial pneumonia: Secondary | ICD-10-CM | POA: Diagnosis not present

## 2022-01-31 DIAGNOSIS — M316 Other giant cell arteritis: Secondary | ICD-10-CM | POA: Diagnosis not present

## 2022-01-31 DIAGNOSIS — I130 Hypertensive heart and chronic kidney disease with heart failure and stage 1 through stage 4 chronic kidney disease, or unspecified chronic kidney disease: Secondary | ICD-10-CM | POA: Diagnosis not present

## 2022-02-01 DIAGNOSIS — I130 Hypertensive heart and chronic kidney disease with heart failure and stage 1 through stage 4 chronic kidney disease, or unspecified chronic kidney disease: Secondary | ICD-10-CM | POA: Diagnosis not present

## 2022-02-01 DIAGNOSIS — R269 Unspecified abnormalities of gait and mobility: Secondary | ICD-10-CM | POA: Diagnosis not present

## 2022-02-01 DIAGNOSIS — I129 Hypertensive chronic kidney disease with stage 1 through stage 4 chronic kidney disease, or unspecified chronic kidney disease: Secondary | ICD-10-CM | POA: Diagnosis not present

## 2022-02-01 DIAGNOSIS — I5020 Unspecified systolic (congestive) heart failure: Secondary | ICD-10-CM | POA: Diagnosis not present

## 2022-02-01 DIAGNOSIS — H8113 Benign paroxysmal vertigo, bilateral: Secondary | ICD-10-CM | POA: Diagnosis not present

## 2022-02-01 DIAGNOSIS — N1831 Chronic kidney disease, stage 3a: Secondary | ICD-10-CM | POA: Diagnosis not present

## 2022-02-01 DIAGNOSIS — I480 Paroxysmal atrial fibrillation: Secondary | ICD-10-CM | POA: Diagnosis not present

## 2022-02-01 DIAGNOSIS — J159 Unspecified bacterial pneumonia: Secondary | ICD-10-CM | POA: Diagnosis not present

## 2022-02-01 DIAGNOSIS — M316 Other giant cell arteritis: Secondary | ICD-10-CM | POA: Diagnosis not present

## 2022-02-07 DIAGNOSIS — I130 Hypertensive heart and chronic kidney disease with heart failure and stage 1 through stage 4 chronic kidney disease, or unspecified chronic kidney disease: Secondary | ICD-10-CM | POA: Diagnosis not present

## 2022-02-07 DIAGNOSIS — M316 Other giant cell arteritis: Secondary | ICD-10-CM | POA: Diagnosis not present

## 2022-02-07 DIAGNOSIS — H8113 Benign paroxysmal vertigo, bilateral: Secondary | ICD-10-CM | POA: Diagnosis not present

## 2022-02-07 DIAGNOSIS — N1831 Chronic kidney disease, stage 3a: Secondary | ICD-10-CM | POA: Diagnosis not present

## 2022-02-07 DIAGNOSIS — J159 Unspecified bacterial pneumonia: Secondary | ICD-10-CM | POA: Diagnosis not present

## 2022-02-07 DIAGNOSIS — I5020 Unspecified systolic (congestive) heart failure: Secondary | ICD-10-CM | POA: Diagnosis not present

## 2022-02-08 DIAGNOSIS — H8113 Benign paroxysmal vertigo, bilateral: Secondary | ICD-10-CM | POA: Diagnosis not present

## 2022-02-08 DIAGNOSIS — J159 Unspecified bacterial pneumonia: Secondary | ICD-10-CM | POA: Diagnosis not present

## 2022-02-08 DIAGNOSIS — E782 Mixed hyperlipidemia: Secondary | ICD-10-CM | POA: Diagnosis not present

## 2022-02-08 DIAGNOSIS — M316 Other giant cell arteritis: Secondary | ICD-10-CM | POA: Diagnosis not present

## 2022-02-08 DIAGNOSIS — R269 Unspecified abnormalities of gait and mobility: Secondary | ICD-10-CM | POA: Diagnosis not present

## 2022-02-08 DIAGNOSIS — N1831 Chronic kidney disease, stage 3a: Secondary | ICD-10-CM | POA: Diagnosis not present

## 2022-02-08 DIAGNOSIS — I480 Paroxysmal atrial fibrillation: Secondary | ICD-10-CM | POA: Diagnosis not present

## 2022-02-08 DIAGNOSIS — I130 Hypertensive heart and chronic kidney disease with heart failure and stage 1 through stage 4 chronic kidney disease, or unspecified chronic kidney disease: Secondary | ICD-10-CM | POA: Diagnosis not present

## 2022-02-08 DIAGNOSIS — I5020 Unspecified systolic (congestive) heart failure: Secondary | ICD-10-CM | POA: Diagnosis not present

## 2022-02-08 DIAGNOSIS — N400 Enlarged prostate without lower urinary tract symptoms: Secondary | ICD-10-CM | POA: Diagnosis not present

## 2022-02-10 DIAGNOSIS — I130 Hypertensive heart and chronic kidney disease with heart failure and stage 1 through stage 4 chronic kidney disease, or unspecified chronic kidney disease: Secondary | ICD-10-CM | POA: Diagnosis not present

## 2022-02-10 DIAGNOSIS — M316 Other giant cell arteritis: Secondary | ICD-10-CM | POA: Diagnosis not present

## 2022-02-10 DIAGNOSIS — I5020 Unspecified systolic (congestive) heart failure: Secondary | ICD-10-CM | POA: Diagnosis not present

## 2022-02-10 DIAGNOSIS — J159 Unspecified bacterial pneumonia: Secondary | ICD-10-CM | POA: Diagnosis not present

## 2022-02-10 DIAGNOSIS — H8113 Benign paroxysmal vertigo, bilateral: Secondary | ICD-10-CM | POA: Diagnosis not present

## 2022-02-10 DIAGNOSIS — N1831 Chronic kidney disease, stage 3a: Secondary | ICD-10-CM | POA: Diagnosis not present

## 2022-02-14 DIAGNOSIS — I5020 Unspecified systolic (congestive) heart failure: Secondary | ICD-10-CM | POA: Diagnosis not present

## 2022-02-14 DIAGNOSIS — N1831 Chronic kidney disease, stage 3a: Secondary | ICD-10-CM | POA: Diagnosis not present

## 2022-02-14 DIAGNOSIS — I130 Hypertensive heart and chronic kidney disease with heart failure and stage 1 through stage 4 chronic kidney disease, or unspecified chronic kidney disease: Secondary | ICD-10-CM | POA: Diagnosis not present

## 2022-02-14 DIAGNOSIS — J159 Unspecified bacterial pneumonia: Secondary | ICD-10-CM | POA: Diagnosis not present

## 2022-02-14 DIAGNOSIS — H8113 Benign paroxysmal vertigo, bilateral: Secondary | ICD-10-CM | POA: Diagnosis not present

## 2022-02-14 DIAGNOSIS — M316 Other giant cell arteritis: Secondary | ICD-10-CM | POA: Diagnosis not present

## 2022-02-15 DIAGNOSIS — I130 Hypertensive heart and chronic kidney disease with heart failure and stage 1 through stage 4 chronic kidney disease, or unspecified chronic kidney disease: Secondary | ICD-10-CM | POA: Diagnosis not present

## 2022-02-15 DIAGNOSIS — E58 Dietary calcium deficiency: Secondary | ICD-10-CM | POA: Diagnosis not present

## 2022-02-15 DIAGNOSIS — J159 Unspecified bacterial pneumonia: Secondary | ICD-10-CM | POA: Diagnosis not present

## 2022-02-15 DIAGNOSIS — I129 Hypertensive chronic kidney disease with stage 1 through stage 4 chronic kidney disease, or unspecified chronic kidney disease: Secondary | ICD-10-CM | POA: Diagnosis not present

## 2022-02-15 DIAGNOSIS — M316 Other giant cell arteritis: Secondary | ICD-10-CM | POA: Diagnosis not present

## 2022-02-15 DIAGNOSIS — H8113 Benign paroxysmal vertigo, bilateral: Secondary | ICD-10-CM | POA: Diagnosis not present

## 2022-02-15 DIAGNOSIS — R269 Unspecified abnormalities of gait and mobility: Secondary | ICD-10-CM | POA: Diagnosis not present

## 2022-02-15 DIAGNOSIS — I480 Paroxysmal atrial fibrillation: Secondary | ICD-10-CM | POA: Diagnosis not present

## 2022-02-15 DIAGNOSIS — E782 Mixed hyperlipidemia: Secondary | ICD-10-CM | POA: Diagnosis not present

## 2022-02-15 DIAGNOSIS — N1831 Chronic kidney disease, stage 3a: Secondary | ICD-10-CM | POA: Diagnosis not present

## 2022-02-15 DIAGNOSIS — I5020 Unspecified systolic (congestive) heart failure: Secondary | ICD-10-CM | POA: Diagnosis not present

## 2022-02-17 DIAGNOSIS — L821 Other seborrheic keratosis: Secondary | ICD-10-CM | POA: Diagnosis not present

## 2022-02-17 DIAGNOSIS — Z85828 Personal history of other malignant neoplasm of skin: Secondary | ICD-10-CM | POA: Diagnosis not present

## 2022-02-17 DIAGNOSIS — L814 Other melanin hyperpigmentation: Secondary | ICD-10-CM | POA: Diagnosis not present

## 2022-02-22 DIAGNOSIS — H8113 Benign paroxysmal vertigo, bilateral: Secondary | ICD-10-CM | POA: Diagnosis not present

## 2022-02-22 DIAGNOSIS — I130 Hypertensive heart and chronic kidney disease with heart failure and stage 1 through stage 4 chronic kidney disease, or unspecified chronic kidney disease: Secondary | ICD-10-CM | POA: Diagnosis not present

## 2022-02-22 DIAGNOSIS — J159 Unspecified bacterial pneumonia: Secondary | ICD-10-CM | POA: Diagnosis not present

## 2022-02-22 DIAGNOSIS — M316 Other giant cell arteritis: Secondary | ICD-10-CM | POA: Diagnosis not present

## 2022-02-22 DIAGNOSIS — N1831 Chronic kidney disease, stage 3a: Secondary | ICD-10-CM | POA: Diagnosis not present

## 2022-02-22 DIAGNOSIS — I5020 Unspecified systolic (congestive) heart failure: Secondary | ICD-10-CM | POA: Diagnosis not present

## 2022-02-23 DIAGNOSIS — I130 Hypertensive heart and chronic kidney disease with heart failure and stage 1 through stage 4 chronic kidney disease, or unspecified chronic kidney disease: Secondary | ICD-10-CM | POA: Diagnosis not present

## 2022-02-23 DIAGNOSIS — I5020 Unspecified systolic (congestive) heart failure: Secondary | ICD-10-CM | POA: Diagnosis not present

## 2022-02-23 DIAGNOSIS — M316 Other giant cell arteritis: Secondary | ICD-10-CM | POA: Diagnosis not present

## 2022-02-23 DIAGNOSIS — N1831 Chronic kidney disease, stage 3a: Secondary | ICD-10-CM | POA: Diagnosis not present

## 2022-02-23 DIAGNOSIS — J159 Unspecified bacterial pneumonia: Secondary | ICD-10-CM | POA: Diagnosis not present

## 2022-02-23 DIAGNOSIS — H8113 Benign paroxysmal vertigo, bilateral: Secondary | ICD-10-CM | POA: Diagnosis not present

## 2022-03-09 DIAGNOSIS — J159 Unspecified bacterial pneumonia: Secondary | ICD-10-CM | POA: Diagnosis not present

## 2022-03-09 DIAGNOSIS — I130 Hypertensive heart and chronic kidney disease with heart failure and stage 1 through stage 4 chronic kidney disease, or unspecified chronic kidney disease: Secondary | ICD-10-CM | POA: Diagnosis not present

## 2022-03-09 DIAGNOSIS — H8113 Benign paroxysmal vertigo, bilateral: Secondary | ICD-10-CM | POA: Diagnosis not present

## 2022-03-10 DIAGNOSIS — I129 Hypertensive chronic kidney disease with stage 1 through stage 4 chronic kidney disease, or unspecified chronic kidney disease: Secondary | ICD-10-CM | POA: Diagnosis not present

## 2022-03-10 DIAGNOSIS — M316 Other giant cell arteritis: Secondary | ICD-10-CM | POA: Diagnosis not present

## 2022-03-10 DIAGNOSIS — R269 Unspecified abnormalities of gait and mobility: Secondary | ICD-10-CM | POA: Diagnosis not present

## 2022-03-10 DIAGNOSIS — E6609 Other obesity due to excess calories: Secondary | ICD-10-CM | POA: Diagnosis not present

## 2022-03-10 DIAGNOSIS — I130 Hypertensive heart and chronic kidney disease with heart failure and stage 1 through stage 4 chronic kidney disease, or unspecified chronic kidney disease: Secondary | ICD-10-CM | POA: Diagnosis not present

## 2022-03-10 DIAGNOSIS — N1831 Chronic kidney disease, stage 3a: Secondary | ICD-10-CM | POA: Diagnosis not present

## 2022-03-10 DIAGNOSIS — E782 Mixed hyperlipidemia: Secondary | ICD-10-CM | POA: Diagnosis not present

## 2022-03-10 DIAGNOSIS — I480 Paroxysmal atrial fibrillation: Secondary | ICD-10-CM | POA: Diagnosis not present
